# Patient Record
Sex: Male | Born: 1940 | Race: White | Hispanic: No | Marital: Married | Smoking: Former smoker
Health system: Southern US, Community
[De-identification: ages and names within clinical notes are randomized; demographics above are authoritative.]

## PROBLEM LIST (undated history)

## (undated) DIAGNOSIS — M1990 Unspecified osteoarthritis, unspecified site: Secondary | ICD-10-CM

## (undated) DIAGNOSIS — I493 Ventricular premature depolarization: Secondary | ICD-10-CM

## (undated) DIAGNOSIS — I714 Abdominal aortic aneurysm, without rupture, unspecified: Secondary | ICD-10-CM

## (undated) DIAGNOSIS — I739 Peripheral vascular disease, unspecified: Secondary | ICD-10-CM

## (undated) DIAGNOSIS — E785 Hyperlipidemia, unspecified: Secondary | ICD-10-CM

## (undated) DIAGNOSIS — I219 Acute myocardial infarction, unspecified: Secondary | ICD-10-CM

## (undated) DIAGNOSIS — D6859 Other primary thrombophilia: Secondary | ICD-10-CM

## (undated) DIAGNOSIS — Z91041 Radiographic dye allergy status: Secondary | ICD-10-CM

## (undated) DIAGNOSIS — I2510 Atherosclerotic heart disease of native coronary artery without angina pectoris: Secondary | ICD-10-CM

## (undated) DIAGNOSIS — I469 Cardiac arrest, cause unspecified: Secondary | ICD-10-CM

## (undated) DIAGNOSIS — Z95810 Presence of automatic (implantable) cardiac defibrillator: Secondary | ICD-10-CM

## (undated) DIAGNOSIS — I472 Ventricular tachycardia, unspecified: Secondary | ICD-10-CM

## (undated) DIAGNOSIS — K649 Unspecified hemorrhoids: Secondary | ICD-10-CM

## (undated) DIAGNOSIS — J189 Pneumonia, unspecified organism: Secondary | ICD-10-CM

## (undated) HISTORY — DX: Hyperlipidemia, unspecified: E78.5

## (undated) HISTORY — PX: TONSILLECTOMY: SUR1361

## (undated) HISTORY — DX: Ventricular premature depolarization: I49.3

## (undated) HISTORY — DX: Other primary thrombophilia: D68.59

## (undated) HISTORY — DX: Ventricular tachycardia: I47.2

## (undated) HISTORY — DX: Peripheral vascular disease, unspecified: I73.9

## (undated) HISTORY — DX: Ventricular tachycardia, unspecified: I47.20

## (undated) HISTORY — DX: Acute myocardial infarction, unspecified: I21.9

## (undated) HISTORY — DX: Cardiac arrest, cause unspecified: I46.9

## (undated) HISTORY — PX: CHOLECYSTECTOMY: SHX55

## (undated) HISTORY — DX: Atherosclerotic heart disease of native coronary artery without angina pectoris: I25.10

---

## 1979-12-13 DIAGNOSIS — I219 Acute myocardial infarction, unspecified: Secondary | ICD-10-CM

## 1979-12-13 HISTORY — DX: Acute myocardial infarction, unspecified: I21.9

## 1997-12-12 HISTORY — PX: CORONARY ANGIOPLASTY WITH STENT PLACEMENT: SHX49

## 1998-03-20 ENCOUNTER — Ambulatory Visit (HOSPITAL_COMMUNITY): Admission: RE | Admit: 1998-03-20 | Discharge: 1998-03-20 | Payer: Self-pay | Admitting: Cardiovascular Disease

## 1998-03-24 ENCOUNTER — Ambulatory Visit (HOSPITAL_COMMUNITY): Admission: RE | Admit: 1998-03-24 | Discharge: 1998-03-25 | Payer: Self-pay | Admitting: Cardiovascular Disease

## 1998-06-23 ENCOUNTER — Ambulatory Visit (HOSPITAL_COMMUNITY): Admission: RE | Admit: 1998-06-23 | Discharge: 1998-06-23 | Payer: Self-pay | Admitting: Cardiology

## 2001-02-20 ENCOUNTER — Encounter: Payer: Self-pay | Admitting: Internal Medicine

## 2001-02-20 ENCOUNTER — Encounter: Admission: RE | Admit: 2001-02-20 | Discharge: 2001-02-20 | Payer: Self-pay | Admitting: Internal Medicine

## 2001-04-10 ENCOUNTER — Encounter: Admission: RE | Admit: 2001-04-10 | Discharge: 2001-04-10 | Payer: Self-pay | Admitting: Internal Medicine

## 2001-04-10 ENCOUNTER — Encounter: Payer: Self-pay | Admitting: Internal Medicine

## 2001-09-12 ENCOUNTER — Encounter: Payer: Self-pay | Admitting: Geriatric Medicine

## 2001-09-12 ENCOUNTER — Encounter: Admission: RE | Admit: 2001-09-12 | Discharge: 2001-09-12 | Payer: Self-pay | Admitting: Geriatric Medicine

## 2001-10-11 ENCOUNTER — Encounter: Payer: Self-pay | Admitting: Internal Medicine

## 2001-10-11 ENCOUNTER — Encounter: Admission: RE | Admit: 2001-10-11 | Discharge: 2001-10-11 | Payer: Self-pay | Admitting: Internal Medicine

## 2001-10-12 ENCOUNTER — Encounter: Payer: Self-pay | Admitting: Internal Medicine

## 2001-10-12 ENCOUNTER — Encounter: Admission: RE | Admit: 2001-10-12 | Discharge: 2001-10-12 | Payer: Self-pay | Admitting: Internal Medicine

## 2001-10-29 ENCOUNTER — Ambulatory Visit (HOSPITAL_COMMUNITY): Admission: RE | Admit: 2001-10-29 | Discharge: 2001-10-29 | Payer: Self-pay | Admitting: Gastroenterology

## 2001-10-29 ENCOUNTER — Encounter: Payer: Self-pay | Admitting: Gastroenterology

## 2003-05-14 ENCOUNTER — Ambulatory Visit (HOSPITAL_COMMUNITY): Admission: RE | Admit: 2003-05-14 | Discharge: 2003-05-14 | Payer: Self-pay | Admitting: Gastroenterology

## 2006-02-06 ENCOUNTER — Encounter: Admission: RE | Admit: 2006-02-06 | Discharge: 2006-02-14 | Payer: Self-pay | Admitting: Neurology

## 2006-11-10 ENCOUNTER — Encounter: Admission: RE | Admit: 2006-11-10 | Discharge: 2006-11-10 | Payer: Self-pay | Admitting: Internal Medicine

## 2008-03-28 ENCOUNTER — Ambulatory Visit: Payer: Self-pay | Admitting: Internal Medicine

## 2008-03-28 ENCOUNTER — Inpatient Hospital Stay (HOSPITAL_COMMUNITY): Admission: EM | Admit: 2008-03-28 | Discharge: 2008-04-08 | Payer: Self-pay | Admitting: Emergency Medicine

## 2008-03-28 ENCOUNTER — Ambulatory Visit: Payer: Self-pay | Admitting: *Deleted

## 2008-03-29 ENCOUNTER — Ambulatory Visit: Payer: Self-pay | Admitting: Vascular Surgery

## 2008-03-29 ENCOUNTER — Encounter: Payer: Self-pay | Admitting: Internal Medicine

## 2008-03-29 DIAGNOSIS — I469 Cardiac arrest, cause unspecified: Secondary | ICD-10-CM

## 2008-03-29 HISTORY — DX: Cardiac arrest, cause unspecified: I46.9

## 2008-03-31 ENCOUNTER — Encounter: Payer: Self-pay | Admitting: Internal Medicine

## 2008-04-04 HISTORY — PX: CARDIAC CATHETERIZATION: SHX172

## 2008-04-07 HISTORY — PX: CARDIAC DEFIBRILLATOR PLACEMENT: SHX171

## 2008-04-14 ENCOUNTER — Ambulatory Visit (HOSPITAL_COMMUNITY): Admission: RE | Admit: 2008-04-14 | Discharge: 2008-04-14 | Payer: Self-pay | Admitting: Cardiovascular Disease

## 2008-04-14 ENCOUNTER — Encounter: Payer: Self-pay | Admitting: Cardiovascular Disease

## 2008-04-23 ENCOUNTER — Ambulatory Visit: Payer: Self-pay

## 2008-05-08 ENCOUNTER — Encounter (HOSPITAL_COMMUNITY): Admission: RE | Admit: 2008-05-08 | Discharge: 2008-08-06 | Payer: Self-pay | Admitting: Cardiology

## 2008-07-29 ENCOUNTER — Ambulatory Visit: Payer: Self-pay | Admitting: Internal Medicine

## 2008-08-07 ENCOUNTER — Encounter (HOSPITAL_COMMUNITY): Admission: RE | Admit: 2008-08-07 | Discharge: 2008-09-08 | Payer: Self-pay | Admitting: Cardiology

## 2008-10-13 ENCOUNTER — Ambulatory Visit: Payer: Self-pay | Admitting: Internal Medicine

## 2009-01-12 ENCOUNTER — Ambulatory Visit: Payer: Self-pay | Admitting: Internal Medicine

## 2009-01-26 ENCOUNTER — Encounter: Admission: RE | Admit: 2009-01-26 | Discharge: 2009-01-26 | Payer: Self-pay | Admitting: Internal Medicine

## 2009-02-04 ENCOUNTER — Ambulatory Visit (HOSPITAL_COMMUNITY): Admission: RE | Admit: 2009-02-04 | Discharge: 2009-02-04 | Payer: Self-pay | Admitting: Internal Medicine

## 2009-02-19 ENCOUNTER — Ambulatory Visit: Payer: Self-pay | Admitting: *Deleted

## 2009-02-25 ENCOUNTER — Encounter: Admission: RE | Admit: 2009-02-25 | Discharge: 2009-02-25 | Payer: Self-pay | Admitting: *Deleted

## 2009-03-05 ENCOUNTER — Ambulatory Visit: Payer: Self-pay | Admitting: *Deleted

## 2009-03-06 ENCOUNTER — Encounter: Payer: Self-pay | Admitting: Internal Medicine

## 2009-04-13 ENCOUNTER — Ambulatory Visit: Payer: Self-pay | Admitting: Internal Medicine

## 2009-04-21 ENCOUNTER — Encounter: Payer: Self-pay | Admitting: Internal Medicine

## 2009-07-31 IMAGING — CR DG CHEST 1V PORT
1 series · 1 of 1 positions shown · non-contrast
Comparison: None

CLINICAL DATA: Acute respiratory failure and cardiac arrest.

PORTABLE CHEST - 1 VIEW

[view not recorded]
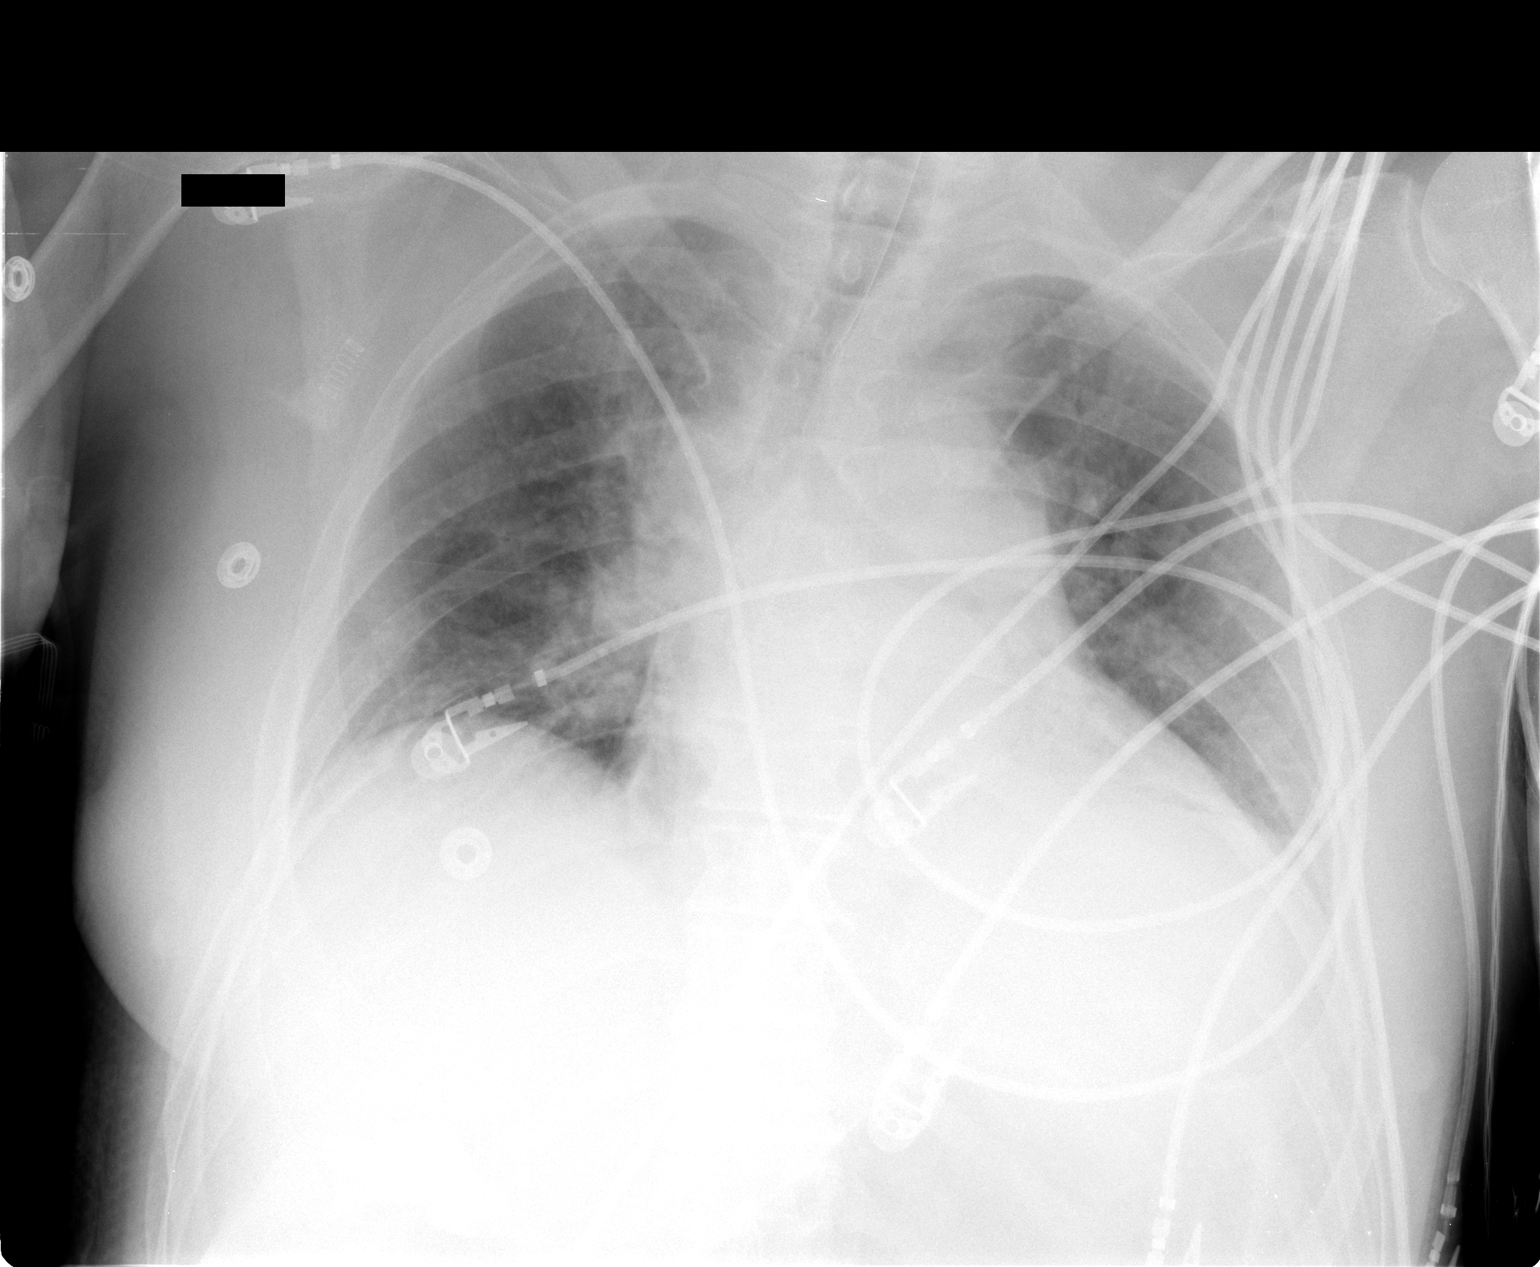

[1 of 1 positions shown; findings below may reference images not displayed]

FINDINGS: Endotracheal tube tip is in the mid thoracic trachea.
Low lung volumes are seen with mild bibasilar atelectasis.  There
is no evidence of pulmonary consolidation or pleural effusion.
Heart size is prominent but exaggerated by low lung volumes.
IMPRESSION: Endotracheal tube in appropriate position.  Low lung volumes and
bibasilar atelectasis.

## 2009-08-10 DIAGNOSIS — I4901 Ventricular fibrillation: Secondary | ICD-10-CM | POA: Insufficient documentation

## 2009-08-10 DIAGNOSIS — I2510 Atherosclerotic heart disease of native coronary artery without angina pectoris: Secondary | ICD-10-CM | POA: Insufficient documentation

## 2009-08-10 DIAGNOSIS — E782 Mixed hyperlipidemia: Secondary | ICD-10-CM | POA: Insufficient documentation

## 2009-08-11 ENCOUNTER — Ambulatory Visit: Payer: Self-pay | Admitting: Internal Medicine

## 2009-08-13 ENCOUNTER — Observation Stay (HOSPITAL_COMMUNITY): Admission: RE | Admit: 2009-08-13 | Discharge: 2009-08-14 | Payer: Self-pay | Admitting: General Surgery

## 2009-08-13 ENCOUNTER — Encounter (INDEPENDENT_AMBULATORY_CARE_PROVIDER_SITE_OTHER): Payer: Self-pay | Admitting: General Surgery

## 2009-08-13 HISTORY — PX: CHOLECYSTECTOMY, LAPAROSCOPIC: SHX56

## 2009-09-09 ENCOUNTER — Ambulatory Visit: Payer: Self-pay | Admitting: Vascular Surgery

## 2009-11-08 ENCOUNTER — Encounter: Payer: Self-pay | Admitting: Internal Medicine

## 2009-11-09 ENCOUNTER — Ambulatory Visit: Payer: Self-pay | Admitting: Internal Medicine

## 2009-11-18 ENCOUNTER — Encounter: Payer: Self-pay | Admitting: Internal Medicine

## 2010-01-12 ENCOUNTER — Encounter: Payer: Self-pay | Admitting: Internal Medicine

## 2010-01-12 ENCOUNTER — Emergency Department (HOSPITAL_COMMUNITY): Admission: EM | Admit: 2010-01-12 | Discharge: 2010-01-12 | Payer: Self-pay | Admitting: Emergency Medicine

## 2010-01-13 ENCOUNTER — Telehealth: Payer: Self-pay | Admitting: Internal Medicine

## 2010-02-07 ENCOUNTER — Encounter: Payer: Self-pay | Admitting: Internal Medicine

## 2010-02-08 ENCOUNTER — Telehealth: Payer: Self-pay | Admitting: Internal Medicine

## 2010-02-08 ENCOUNTER — Ambulatory Visit: Payer: Self-pay | Admitting: Internal Medicine

## 2010-02-16 ENCOUNTER — Encounter: Payer: Self-pay | Admitting: Internal Medicine

## 2010-02-24 ENCOUNTER — Ambulatory Visit: Payer: Self-pay | Admitting: Vascular Surgery

## 2010-04-19 ENCOUNTER — Encounter: Payer: Self-pay | Admitting: Internal Medicine

## 2010-05-09 ENCOUNTER — Encounter: Payer: Self-pay | Admitting: Internal Medicine

## 2010-05-10 ENCOUNTER — Ambulatory Visit: Payer: Self-pay | Admitting: Internal Medicine

## 2010-06-10 ENCOUNTER — Encounter: Payer: Self-pay | Admitting: Internal Medicine

## 2010-07-13 ENCOUNTER — Ambulatory Visit: Payer: Self-pay | Admitting: Cardiology

## 2010-07-19 ENCOUNTER — Ambulatory Visit: Payer: Self-pay | Admitting: Cardiology

## 2010-08-31 ENCOUNTER — Ambulatory Visit: Payer: Self-pay | Admitting: Internal Medicine

## 2010-11-09 ENCOUNTER — Ambulatory Visit: Payer: Self-pay | Admitting: Vascular Surgery

## 2010-11-22 ENCOUNTER — Ambulatory Visit: Payer: Self-pay | Admitting: Cardiology

## 2010-12-02 ENCOUNTER — Ambulatory Visit: Payer: Self-pay | Admitting: Internal Medicine

## 2011-01-09 ENCOUNTER — Encounter (INDEPENDENT_AMBULATORY_CARE_PROVIDER_SITE_OTHER): Payer: Self-pay | Admitting: *Deleted

## 2011-01-11 NOTE — Letter (Signed)
Summary: Remote Device Check  [facility], Main Office  [address]   [place], [place] [address]   Phone: [phone]  Fax: [phone]     February 16, 2010 MRN: [id number]   Joel Lin [address] [place], [place]  [address]   Dear [name],   Your remote transmission was recieved and reviewed by your physician.  All diagnostics were within normal limits for you.  __X___Your next transmission is scheduled for:    May 10, 2010.  Please transmit at any time this day.  If you have a wireless device your transmission will be sent automatically.     Sincerely,  Proofreader

## 2011-01-11 NOTE — Progress Notes (Signed)
Summary: **SK** defib went off   Phone Note Call from Patient Call back at Home Phone [phone]   Caller: Patient Reason for Call: Talk to Nurse, Talk to Doctor Summary of Call: per pt call his defib went off and he was seen at [facility] ed last night  Initial call taken by: Omer Jack,  January 13, 2010 8:38 AM  Follow-up for Phone Call        pt calling again, was told that he would need to be seen today or tomorrow per the er, Migdalia Dk  January 13, 2010 11:55 AM   Additional Follow-up for Phone Call Additional follow up Details #1::        Pts defib fired 3 times yesterday and he was advised to go to the ER. He was checked by industry, Hospital doctor has a copy. Nothing was found, his device fired only d/t elevated HR (193). His set rate is 180. He started a steroid pack and nasal steroids yesterday. He went to the dentist and was given Novicaine with Epi. His meds were changed in the ER to Atenolol 25mg , 2 in AM and 2 in PM. Pt feels fine. He stopped the steroids. Just wanted to run this all by Dr. Graciela Husbands to see if he wanted to make any med changes or increase his Upper device rate. I will s/w Dr. Graciela Husbands about this first thing in the AM and call him if he needs to come in or if we need to make changes. Contact # [phone].  Additional Follow-up by: Duncan Dull, RN, BSN,  January 13, 2010 5:33 PM    Additional Follow-up for Phone Call Additional follow up Details #2::    Per Dr. Graciela Husbands, pt received proper treatment and should continue with hosp d/c plan. He will need to f/u with our device clinic in 6-8 weeks. Pt aware and will call to make the appt.  Follow-up by: Duncan Dull, RN, BSN,  January 14, 2010 11:23 AM

## 2011-01-11 NOTE — Progress Notes (Signed)
Summary: follow up appts   Phone Note Outgoing Call Call back at [facility] Phone [phone]   Call placed by: Gypsy Balsam RN BSN,  February 08, 2010 2:24 PM Summary of Call: Called patient and left message on machine for patient to call about follow up appts.  Merlin transmission recieved today.  No arrythmias.  Pt has appt with device clinic next month that can be cancelled and can follow up with Dr Graciela Husbands as scheduled in September unless pt wants earlier follow up with Graciela Husbands. Gypsy Balsam RN BSN  February 08, 2010 2:25 PM  Plan per above, ok per patient. Gypsy Balsam RN BSN  February 08, 2010 4:46 PM

## 2011-01-11 NOTE — Cardiovascular Report (Signed)
Summary: Office Visit Remote   Office Visit Remote   Imported By: Roderic Ovens 06/11/2010 16:29:49  _____________________________________________________________________  External Attachment:    Type:   Image     Comment:   External Document

## 2011-01-11 NOTE — Cardiovascular Report (Signed)
Summary: Office Visit Remote   Office Visit Remote   Imported By: Roderic Ovens 02/17/2010 11:10:21  _____________________________________________________________________  External Attachment:    Type:   Image     Comment:   External Document

## 2011-01-11 NOTE — Cardiovascular Report (Signed)
Summary: Office Visit   Office Visit   Imported By: Roderic Ovens 08/31/2010 14:35:58  _____________________________________________________________________  External Attachment:    Type:   Image     Comment:   External Document

## 2011-01-11 NOTE — Assessment & Plan Note (Signed)
Summary: defib/saf  Medications Added NITRO-DUR 0.2 MG/HR PT24 (NITROGLYCERIN) UAD NITROSTAT 0.4 MG SUBL (NITROGLYCERIN) 1 tablet under tongue at onset of chest pain; you may repeat every 5 minutes for up to 3 doses. PENICILLIN V POTASSIUM 500 MG TABS (PENICILLIN V POTASSIUM) 1 tablet 4 times per day      Allergies Added: ! IODINE  History of Present Illness: Mr. Joel Lin is seen in followup for aborted sudden cardiac death in the setting of ischemic heart disease prior PCI and mild depression of LV systolic function.  he is status post ICD implantation.    There has been an intercurrent Myoview scan in May; ejection fraction had improved to 53%; previous IMI evident.  There have been no intercurrent discharges. He denies chest pain or shortness of breath.  Current Medications (verified): 1)  Niaspan 500 Mg Cr-Tabs (Niacin (Antihyperlipidemic)) .... Take One Tablet Bid. 2)  Atenolol 25 Mg Tabs (Atenolol) .... 3 Tabs Per Day 3)  Welchol 625 Mg Tabs (Colesevelam Hcl) .... Take Three Tablets Two Times A Day 4)  Fish Oil   Oil (Fish Oil) .... 1000mg  Take One Tablet By Mouth Once Daily. 5)  Aspirin 81 Mg Tabs (Aspirin) .... Once Daily 6)  Nitro-Dur 0.2 Mg/hr Pt24 (Nitroglycerin) .... Uad 7)  Nitrostat 0.4 Mg Subl (Nitroglycerin) .Marland Kitchen.. 1 Tablet Under Tongue At Onset of Chest Pain; You May Repeat Every 5 Minutes For Up To 3 Doses. 8)  Penicillin V Potassium 500 Mg Tabs (Penicillin V Potassium) .Marland Kitchen.. 1 Tablet 4 Times Per Day  Allergies (verified): 1)  ! Iodine  Vital Signs:  Patient profile:   70 year old male Height:      68 inches Weight:      168 pounds BMI:     25.64 Pulse rate:   58 / minute BP sitting:   124 / 80  (left arm)  Vitals Entered By: Laurance Flatten CMA (August 31, 2010 9:58 AM)  Physical Exam  General:  The patient was alert and oriented in no acute distress. HEENT Normal.  Neck veins were flat, carotids were brisk.  Lungs were clear.  Heart sounds were  regular without murmurs or gallops.  Abdomen was soft with active bowel sounds. There is no clubbing cyanosis or edema. Skin Warm and dry     ICD Specifications Following MD:  Sherryl Manges, MD     ICD Vendor:  [employer]     ICD Model Number:  [phone]     ICD Serial Number:  [id number] ICD DOI:  04/07/2008     ICD Implanting MD:  Sherryl Manges, MD  Lead 1:    Location: RV     DOI: 04/07/2008     Model #: [id number]     Serial #: [id number]     Status: active  Indications::  ABORTED CARDIAC ARREST   ICD Follow Up Remote Check?  No Battery Voltage:  3.17 V     Charge Time:  11.3 seconds     Battery Est. Longevity:  6.6 years Underlying rhythm:  Huston Foley ICD Dependent:  No       ICD Device Measurements Right Ventricle:  Amplitude: 8.9 mV, Impedance: 350 ohms, Threshold: 1.0 V at 0.4 msec Shock Impedance: 47 ohms   Brady Parameters Mode VVI     Lower Rate Limit:  40      Tachy Zones VF:  240     VT:  214     VT1:  181     Next Remote  Date:  12/02/2010     Next Cardiology Appt Due:  08/13/2011 Tech Comments:  No parameter changes. Device function normal.  3 magnet alerts were during colonscopy 06/01/10.  Merlin transmissions every 3 months.  ROV 1 year with Dr. Graciela Husbands. Altha Harm, LPN  August 31, 2010 10:05 AM   Impression & Recommendations:  Problem # 1:  DEFIBRILLATOR ST J (ICD-V45.02) Assessment New Device parameters and data were reviewed and no changes were made  Problem # 2:  VENTRICULAR FIBRILLATION (ICD-427.41) no intercurrent ventricular arrhythmias  His updated medication list for this problem includes:    Atenolol 25 Mg Tabs (Atenolol) .Marland KitchenMarland KitchenMarland KitchenMarland Kitchen 3 tabs per day    Aspirin 81 Mg Tabs (Aspirin) ..... Once daily    Nitro-dur 0.2 Mg/hr Pt24 (Nitroglycerin) ..... Uad    Nitrostat 0.4 Mg Subl (Nitroglycerin) .Marland Kitchen... 1 tablet under tongue at onset of chest pain; you may repeat every 5 minutes for up to 3 doses.  Problem # 3:  CORONARY ATHEROSCLEROSIS (ICD-414.00) without chest pain; Myoview  without ischemia  His updated medication list for this problem includes:    Atenolol 25 Mg Tabs (Atenolol) .Marland KitchenMarland KitchenMarland KitchenMarland Kitchen 3 tabs per day    Aspirin 81 Mg Tabs (Aspirin) ..... Once daily    Nitro-dur 0.2 Mg/hr Pt24 (Nitroglycerin) ..... Uad    Nitrostat 0.4 Mg Subl (Nitroglycerin) .Marland Kitchen... 1 tablet under tongue at onset of chest pain; you may repeat every 5 minutes for up to 3 doses.  Patient Instructions: 1)  Your physician recommends that you continue on your current medications as directed. Please refer to the Current Medication list given to you today. 2)  Your physician wants you to follow-up in:  1 year You will receive a reminder letter in the mail two months in advance. If you don't receive a letter, please call our office to schedule the follow-up appointment.

## 2011-01-11 NOTE — Cardiovascular Report (Signed)
Summary: Office Visit  Office Visit   Imported By: Roderic Ovens 01/20/2010 12:42:59  _____________________________________________________________________  External Attachment:    Type:   Image     Comment:   External Document

## 2011-01-11 NOTE — Letter (Signed)
Summary: Remote Device Check  [facility], Main Office  [address]   [place], [place] [address]   Phone: [phone]  Fax: [phone]     June 10, 2010 MRN: [id number]   Joel Lin [address] [place], [place]  [address]   Dear Joel Lin,   Your remote transmission was recieved and reviewed by your physician.  All diagnostics were within normal limits for you.   __X____Your next office visit is scheduled for:   September 2011 with Dr Graciela Husbands. Please call our office to schedule an appointment.    Sincerely,  Vella Kohler

## 2011-01-19 NOTE — Cardiovascular Report (Signed)
Summary: Office Visit Remote   Office Visit Remote   Imported By: Roderic Ovens 01/10/2011 12:08:39  _____________________________________________________________________  External Attachment:    Type:   Image     Comment:   External Document

## 2011-01-19 NOTE — Letter (Signed)
Summary: Remote Device Check  [facility], Main Office  [address]   [place], [place] [address]   Phone: [phone]  Fax: [phone]     January 09, 2011 MRN: [id number]   Joel Lin [address] [place], [place]  [address]   Dear [name],   Your remote transmission was recieved and reviewed by your physician.  All diagnostics were within normal limits for you.  __X___Your next transmission is scheduled for:   03-03-2011.  Please transmit at any time this day.  If you have a wireless device your transmission will be sent automatically.   Sincerely,  Vella Kohler

## 2011-01-20 ENCOUNTER — Ambulatory Visit (INDEPENDENT_AMBULATORY_CARE_PROVIDER_SITE_OTHER): Payer: Medicare Other | Admitting: Cardiology

## 2011-01-20 DIAGNOSIS — R5383 Other fatigue: Secondary | ICD-10-CM

## 2011-01-20 DIAGNOSIS — R42 Dizziness and giddiness: Secondary | ICD-10-CM

## 2011-01-20 DIAGNOSIS — R5381 Other malaise: Secondary | ICD-10-CM

## 2011-01-20 DIAGNOSIS — I252 Old myocardial infarction: Secondary | ICD-10-CM

## 2011-01-24 ENCOUNTER — Encounter: Payer: Self-pay | Admitting: Internal Medicine

## 2011-01-24 ENCOUNTER — Encounter (INDEPENDENT_AMBULATORY_CARE_PROVIDER_SITE_OTHER): Payer: Medicare Other

## 2011-01-24 DIAGNOSIS — I428 Other cardiomyopathies: Secondary | ICD-10-CM

## 2011-01-25 ENCOUNTER — Ambulatory Visit (INDEPENDENT_AMBULATORY_CARE_PROVIDER_SITE_OTHER): Payer: Medicare Other | Admitting: Cardiology

## 2011-01-25 DIAGNOSIS — I2510 Atherosclerotic heart disease of native coronary artery without angina pectoris: Secondary | ICD-10-CM

## 2011-01-25 DIAGNOSIS — E7800 Pure hypercholesterolemia, unspecified: Secondary | ICD-10-CM

## 2011-01-25 DIAGNOSIS — I495 Sick sinus syndrome: Secondary | ICD-10-CM

## 2011-02-02 NOTE — Procedures (Signed)
Summary: defib ck/mt  Medications Added NIASPAN 500 MG CR-TABS (NIACIN (ANTIHYPERLIPIDEMIC)) one by mouth daily THERAGRAN-M  TABS (MULTIPLE VITAMINS-MINERALS) one by mouth daily IBUPROFEN 200 MG CAPS (IBUPROFEN) as needed      Allergies Added: ! PLAVIX  Current Medications (verified): 1)  Niaspan 500 Mg Cr-Tabs (Niacin (Antihyperlipidemic)) .... One By Mouth Daily 2)  Atenolol 25 Mg Tabs (Atenolol) .... 3 Tabs Per Day 3)  Welchol 625 Mg Tabs (Colesevelam Hcl) .... Take Three Tablets Two Times A Day 4)  Fish Oil   Oil (Fish Oil) .... 1000mg  Take One Tablet By Mouth Once Daily. 5)  Aspirin 81 Mg Tabs (Aspirin) .... Once Daily 6)  Nitro-Dur 0.2 Mg/hr Pt24 (Nitroglycerin) .... Uad 7)  Nitrostat 0.4 Mg Subl (Nitroglycerin) .Marland Kitchen.. 1 Tablet Under Tongue At Onset of Chest Pain; You May Repeat Every 5 Minutes For Up To 3 Doses. 8)  Theragran-M  Tabs (Multiple Vitamins-Minerals) .... One By Mouth Daily 9)  Ibuprofen 200 Mg Caps (Ibuprofen) .... As Needed  Allergies (verified): 1)  ! Iodine 2)  ! Plavix   ICD Specifications Following MD:  Sherryl Manges, MD     ICD Vendor:  [facility] Jude     ICD Model Number:  [phone]     ICD Serial Number:  [id number] ICD DOI:  04/07/2008     ICD Implanting MD:  Sherryl Manges, MD  Lead 1:    Location: RV     DOI: 04/07/2008     Model #: [id number]     Serial #: [id number]     Status: active  Indications::  ABORTED CARDIAC ARREST   ICD Follow Up ICD Dependent:  No      Brady Parameters Mode VVI     Lower Rate Limit:  40      Tachy Zones VF:  240     VT:  214     VT1:  181     Tech Comments:  see PaceArt

## 2011-02-08 NOTE — Cardiovascular Report (Signed)
Summary: Office Visit   Office Visit   Imported By: Roderic Ovens 02/04/2011 09:06:24  _____________________________________________________________________  External Attachment:    Type:   Image     Comment:   External Document

## 2011-02-17 NOTE — Cardiovascular Report (Signed)
Summary: Office Visit   Office Visit   Imported By: Roderic Ovens 02/10/2011 16:15:16  _____________________________________________________________________  External Attachment:    Type:   Image     Comment:   External Document

## 2011-03-02 LAB — POCT I-STAT, CHEM 8
BUN: 24 mg/dL — ABNORMAL HIGH (ref 6–23)
Creatinine, Ser: 0.8 mg/dL (ref 0.4–1.5)
Glucose, Bld: 124 mg/dL — ABNORMAL HIGH (ref 70–99)
Hemoglobin: 16.3 g/dL (ref 13.0–17.0)
Potassium: 4.1 mEq/L (ref 3.5–5.1)

## 2011-03-02 LAB — URINE MICROSCOPIC-ADD ON

## 2011-03-02 LAB — POCT CARDIAC MARKERS
CKMB, poc: 6.7 ng/mL (ref 1.0–8.0)
Troponin i, poc: <0.05 ng/mL (ref 0.00–0.09)

## 2011-03-02 LAB — DIFFERENTIAL
Eosinophils Absolute: 0 10*3/uL (ref 0.0–0.7)
Lymphocytes Relative: 8 % — ABNORMAL LOW (ref 12–46)
Lymphs Abs: 0.9 10*3/uL (ref 0.7–4.0)
Monocytes Relative: 6 % (ref 3–12)
Neutrophils Relative %: 85 % — ABNORMAL HIGH (ref 43–77)

## 2011-03-02 LAB — URINALYSIS, ROUTINE W REFLEX MICROSCOPIC
Leukocytes, UA: NEGATIVE
Nitrite: NEGATIVE
Protein, ur: NEGATIVE mg/dL
Specific Gravity, Urine: 1.021 (ref 1.005–1.030)
Urobilinogen, UA: 0.2 mg/dL (ref 0.0–1.0)

## 2011-03-02 LAB — BASIC METABOLIC PANEL
Chloride: 107 mEq/L (ref 96–112)
Creatinine, Ser: 0.94 mg/dL (ref 0.4–1.5)
GFR calc Af Amer: >60 mL/min (ref >60)
Potassium: 4.2 mEq/L (ref 3.5–5.1)

## 2011-03-02 LAB — CBC
HCT: 47.5 % (ref 39.0–52.0)
MCV: 88.4 fL (ref 78.0–100.0)
RBC: 5.38 MIL/uL (ref 4.22–5.81)
WBC: 10.8 10*3/uL — ABNORMAL HIGH (ref 4.0–10.5)

## 2011-03-19 LAB — CBC
HCT: 47.3 % (ref 39.0–52.0)
MCHC: 33 g/dL (ref 30.0–36.0)
MCV: 88.4 fL (ref 78.0–100.0)
Platelets: 118 10*3/uL — ABNORMAL LOW (ref 150–400)
RDW: 13.8 % (ref 11.5–15.5)

## 2011-03-19 LAB — COMPREHENSIVE METABOLIC PANEL
BUN: 16 mg/dL (ref 6–23)
CO2: 29 mEq/L (ref 19–32)
Calcium: 9.5 mg/dL (ref 8.4–10.5)
Creatinine, Ser: 0.99 mg/dL (ref 0.4–1.5)
GFR calc non Af Amer: >60 mL/min (ref >60)
Glucose, Bld: 96 mg/dL (ref 70–99)
Sodium: 140 mEq/L (ref 135–145)
Total Protein: 7.2 g/dL (ref 6.0–8.3)

## 2011-03-19 LAB — PROTIME-INR
INR: 1 (ref 0.00–1.49)
Prothrombin Time: 13.3 seconds (ref 11.6–15.2)

## 2011-03-19 LAB — DIFFERENTIAL
Eosinophils Absolute: 0.2 10*3/uL (ref 0.0–0.7)
Lymphocytes Relative: 32 % (ref 12–46)
Lymphs Abs: 1.8 10*3/uL (ref 0.7–4.0)
Monocytes Relative: 17 % — ABNORMAL HIGH (ref 3–12)
Neutro Abs: 2.7 10*3/uL (ref 1.7–7.7)
Neutrophils Relative %: 47 % (ref 43–77)

## 2011-03-19 LAB — APTT: aPTT: 26 seconds (ref 24–37)

## 2011-04-26 NOTE — Consult Note (Signed)
NAME:  Joel Lin NO.:  [id number]   MEDICAL RECORD NO.:  [id number]           PATIENT TYPE:   LOCATION:                                 FACILITY:   PHYSICIAN:  Duke Salvia, MD, FACCDATE OF BIRTH:  11-Jan-1941   DATE OF CONSULTATION:  DATE OF DISCHARGE:                                 CONSULTATION   HISTORY:  Thank you very much for asking [place] to see Joel Lin in  consultation following out-of-hospital cardiac arrest.  He has a remote  history of myocardial infarction in 1981 and underwent stenting in 1998  by Dr. Elease Hashimoto.  He had done well in the interim with basically class I  symptoms until he collapsed suddenly on March 29, 2008.  EMS was called.  He was found to be in ventricular fibrillation.  He was defibrillated  x3.  Electrocardiograms were nondiagnostic for acute myocardial  infarction.  It turned out his peak troponin was 0.72.  He was brought  to the hospital where his CT scan of his head was negative and a  hypothermia protocol was initiated.  He was extubated yesterday and on  the day of the consultation and was alert and relatively oriented at  that time.   His cardiac evaluation here to date has included an ultrasound  demonstrated ejection fraction of 35%-40% with inferoposterior wall MI.  Telemetry has demonstrated PVCs.   The patient's past medical history is notable for hypertension,  dyslipidemia, and questionable hypercoagulable state.   MEDICATIONS AT HOME:  Included atenolol, Nitropress, Niaspan, aspirin,  and currently, he is on intravenous beta-blockers and aspirin.   ALLERGIES:  He has no known drug allergies.   SOCIAL HISTORY:  He is married.  He has a daughter who is a Engineer, civil (consulting).   FAMILY HISTORY:  Noncontributory.   PHYSICAL EXAM:  On examination, his blood pressure is 126/55 having been  previously in the day as low as 95/50.  His respirations were 12-14.  His pulse was 93 and regular.  He was in no acute distress.   His HEENT  exam demonstrated no evidence for xanthoma.  His neck veins were flat.  His carotids were brisk and full bilateral without bruits.  The back was  without kyphosis or scoliosis.  His lungs were clear.  His heart sounds  were regular.  The abdomen was soft with active bowel sounds without  midline pulsation or hepatomegaly.  Femoral pulses were 2+.  Distal  pulses were intact with no clubbing, cyanosis, or edema.  The  neurological exam was grossly normal.  His skin was warm and dry.   Electrocardiogram dated April 01, 2008, demonstrated sinus rhythm at 73  with intervals of 0.24/0.10/0.39.  There was evidence of previous  inferior predominantly posterior wall myocardial infarction.   PT has been done since the consultation yesterday with no significant  abnormalities with some generalized slowing.   LABORATORY DATA:  Burgess Estelle was notable for potassium of 3.2 and  hemoglobin of 10.7.   IMPRESSION:  1. Out-of-hospital cardiac arrest.  2. Coronary artery disease.      a.  Remote myocardial infarction with ejection fraction of 35%.      b.     Non-STEMI peak troponin of 0.723.  3. Congestive heart failure - class I.  4. No history of palpitations.  5. Hypokalemia.   DISCUSSION:  Mr. Joel Lin had out-of-hospital cardiac arrest in the  setting of remote myocardial infarction without evidence of predominant  new cardiac event.  Catheterization is anticipated and would concur.   Cardiac arrest is seen in late myocardial infarction and the risk  increases overtime as dated from the MADIT II trial showed.  It is  likely because of his event.  ICD implantation is also indicated for  prevention of recurrent cardiac arrest.   I have reviewed the above with the family.  If he gets closer to the  time of the procedure, we will review it again as the patient's mental  status remains modestly diminished.      Duke Salvia, MD, [facility]  Electronically Signed      SCK/MEDQ  D:  04/02/2008  T:  04/03/2008  Job:  [id number]   cc:   Cassell Clement, M.D.

## 2011-04-26 NOTE — Consult Note (Signed)
NAME:  Joel Lin NO.:  [id number]   MEDICAL RECORD NO.:  [id number]          PATIENT TYPE:  INP   LOCATION:  2905                         FACILITY:  [facility]   PHYSICIAN:  Audery Amel, MD    DATE OF BIRTH:  [birth year]   DATE OF CONSULTATION:  03/29/2008  DATE OF DISCHARGE:                                 CONSULTATION   REASON FOR CONSULTATION:  Cardiac arrest.   HISTORY OF PRESENT ILLNESS:  Joel Lin is a 70 year old white male  with a remote history of coronary artery disease, status post PCI in the  late 90s who presents to the emergency department after experiencing out  of hospital cardiac arrest.  Apparently, the patient was in his usual  state of health until this evening.  He and his wife were watching a  movie when he suddenly began to feel ill, diaphoretic, and lightheaded.  Subsequently, he grabbed his head in apparent pain, and passed out per  his wife.  EMS was activated.  On their arrival, the patient was noted  to be in ventricular fibrillation and required three defibrillations.  Normal sinus rhythm was restored, and the patient was transferred to the  emergency department for further evaluation.   On arrival, the patient was hemodynamically stable.  His initial EKG  revealed normal sinus rhythm with no evidence of acute injury or  ischemia.  There was evidence for a past or prior inferoposterior  infarct.  His initial cardiac biomarkers were negative.  A head CT was  obtained, which revealed no acute intracranial process.  The hypothermia  protocol was instituted in the emergency department, the patient was  transported to the CCU, where he has remained hemodynamically stable.   PAST MEDICAL HISTORY:  1. CAD.  Per the family, the patient has experienced an MI in 1981,      which was attributed to a clot.  The patient did have a      percutaneous intervention performed in the early 90s, although I do      not have the details of this  procedure.  2. Hypertension.  3. Hyperlipidemia.  4. Questionable history of a borderline hypercoagulable state.      Apparently, the patient has a borderline elevated anticardiolipin      antibody test.   HOME MEDICATIONS:  Atenolol, nitro paste, Niaspan, WelChol, and aspirin.   ALLERGIES:  No known drug allergies.   SOCIAL HISTORY:  The patient is married.  He has a daughter who is an  Charity fundraiser, who I spoke with this morning.  He is a retired Air cabin crew.  Denies smoking, alcohol, or illicit substances.   FAMILY HISTORY:  There is a general history of coronary artery disease  in the family.  He has a daughter who has antiphospholipid antibody  syndrome and has had multiple PEs.   PHYSICAL EXAMINATION:  VITAL SIGNS:  His blood pressure is 142/71, heart  rate is 40, and O2 sats 100% on room air.  In general, the patient is  intubated, sedated, and paralyzed.  HEENT:  Normocephalic and atraumatic.  EOMI.  PERRLA.  Nares  are patent.  OMP is clear.  NECK:  Supple, full range of motion, and no appreciable JVD.  CHEST:  Clear to auscultation bilaterally.  CARDIOVASCULAR:  Normal S1-S2 with no audible murmurs, rubs, or gallops.  His PMI is nondisplaced in the left midclavicular line.  Peripheral  pulses are 2+ and symmetric.  SKIN:  Cool, dry, and intact.  ABDOMEN:  Soft, nontender, and nondistended.  Positive bowel sounds.  No  hepatosplenomegaly.  GU:  Normal male genitalia.  EXTREMITIES:  Show no clubbing, cyanosis, or edema.  There is no  evidence of inflammation, ulceration, lesions, or petechiae.  MUSCULOSKELETAL:  No joint deformity or effusions.  NEUROLOGIC:  Unable to assess due to the sedated and paralytics day.   RADIOLOGY:  Head CT negative for acute intracranial process.   EKG, normal sinus rhythm with evidence of prior inferoposterior infarct.  There is no evidence of acute injury or ischemia.   LABORATORY DATA:  Sodium 137, potassium 2.8, chloride 103, CO2 is 22,   BUN is 19, and creatinine 0.74.  CBC; white count 8.8, hematocrit is 41,  and platelet count 122.  Cardiac biomarkers; troponin I is 0.09, CK-MB  is 4.4.  ABG; pH 7.46, pCO2 is 30, and pO2 is 90.8 on 40% FiO2.   IMPRESSION:  1. Cardiac arrest.  2. Coronary artery disease.  3. Hypertension.  4. Hyperlipidemia.  5. Questionable history of hypercoagulable state (antiphospholipid      antibodies).   RECOMMENDATIONS:  From a cardiovascular standpoint, [name] is  currently hemodynamically stable.  He is initiated on the hypothermia  protocol in the emergency department, and has achieved the goal of a  core temperature.  On review of his EKG, there is evidence of a prior  inferoposterior infarct, but no evidence of acute injury or ischemia.  His initial cardiac biomarkers have been negative x2, although I suspect  that in the setting of cardiac arrest his enzymes will elevate to some  degree.   At this point, the patient has no acute indication for cardiac  catheterization.  I recommend initiating on a fractionated heparin drip  and continuing aspirin therapy daily.  There is no role for 2b3a  inhibitor at this juncture.  This is somewhat unclear as to the etiology  of his arrest.  It maybe related to ischemia versus a primary  arrhythmogenic etiology.  After the patient is warmed and assessed from  a neurological standpoint, cardiac catheterization will likely be  warranted to rule out any progression of coronary artery disease.  If  there is no evidence of new coronary artery disease, the patient will  likely require electrophysiological testing and placement of ICD during  this admission.   On review of labs this a.m., he is hypokalemic, however, I suspect this  is related to aggressive volume expansion with potassium deficient  fluids and diuresis.  We would recommend checking a fasting lipid  profile, and would consider a statin therapy.  Currently, his heart is  sinus  bradycardia with a heart rate of 40, precludes treatment with a  beta-blocker.  I would recommend checking a transthoracic echocardiogram  to assess his left ventricular function and structure.  We will follow  along with you throughout his hospital course.  If there are any  additional questions, please feel free to contact us.      Audery Amel, MD  Electronically Signed     SHG/MEDQ  D:  03/29/2008  T:  03/29/2008  Job:  [id number] 

## 2011-04-26 NOTE — Procedures (Signed)
DUPLEX ULTRASOUND OF ABDOMINAL AORTA   INDICATION:  Followup of abdominal aortic aneurysm.   HISTORY:  Diabetes:  No.  Cardiac:  Pacemaker, defibrillator, MI 2009.  Hypertension:  No.  Smoking:  Previous.  Connective Tissue Disorder:  Family History:  No.  Previous Surgery:  No.   DUPLEX EXAM:         AP (cm)                   TRANSVERSE (cm)  Proximal             4.07 cm                   3.45 cm  Mid                  4.15 cm                   3.71 cm  Distal               4.22 cm                   4.07 cm  Right Iliac          0.62 cm                   1.27 cm  Left Iliac           0.81 cm                   0.99 cm   PREVIOUS:  Date:  09/09/2009  AP:  4.07  TRANSVERSE:  4.16   IMPRESSION:  1. Abdominal aortic aneurysm with largest measurement of 4.22 x 4.07      cm.  2. 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   ___________________________________________  Larina Earthly, M.D.   CJ/MEDQ  D:  02/24/2010  T:  02/24/2010  Job:  [id number]

## 2011-04-26 NOTE — Consult Note (Signed)
VASCULAR SURGERY CONSULTATION   Joel Lin, Joel Lin  DOB:  [birth year]                                       02/19/2009  VQQVZ#:[id number]   PRIMARY CARE PHYSICIAN:  Renford Dills, M.D.   CARDIOLOGIST:  Cassell Clement, MD   REFERRAL DIAGNOSIS:  A 4.4 cm abdominal aortic aneurysm.   HISTORY:  The patient is a 70 year old retiree, resident of [name]  who has a history of known gallstone disease.  He has over the past 4-6  weeks suffered from right flank discomfort.  This is not associated with  nausea or vomiting.  He does not associate this with meals.  Intermittent discomfort.  Bowel movements are regular.  No history of  peptic ulcer disease or GI bleeding.   Abdominal ultrasound does verify known gallstone disease.  His  gallbladder does function by HIDA scan.   Abdominal ultrasound also verifies a 4.1 cm x 4.4 cm abdominal aortic  aneurysm.   PAST MEDICAL HISTORY:  1. Coronary artery disease status post PCI 1999 and MI in 1981.  2. Ventricular fibrillation cardiac arrest April 2009 with AICD      placement.  3. Hyperlipidemia.  4. BPH.  5. Cardiomyopathy with EF 35% to 40%.   MEDICATIONS:  1. Atenolol 25 mg 2 tablets every morning, 1 tablet every evening.  2. Welchol 625 mg 3 tablets every morning, 3 tablets every evening.  3. Aspirin 81 mg daily.  4. Nitro-Dur 0.2 mg per hour.  5. Niaspan 500 mg daily.  6. Fish Oil 1000 mg two times daily.  7. Multivitamin 1 tablet daily.  8. Ibuprofen p.Lin.n.   ALLERGIES:  None known.   FAMILY HISTORY:  Significant for coronary artery disease in his father.   SOCIAL HISTORY:  The patient is married with 2 children.  He is a  retiree, having worked in [employer].  No tobacco use.  Discontinued tobacco in 1981.  Has one glass of red wine daily.   REVIEW OF SYSTEMS:  Refer to patient encounter form..  The patient  denies any complaints other than that of presenting illness.   PHYSICAL  EXAM:  General:  A well-appearing 70 year old male.  Alert and  oriented.  No distress.  Vital Signs:  BP 142/78, pulse 49 per minute,  respirations 18 per minute.  HEENT:  Unremarkable.  Neck:  Supple.  No  thyromegaly or adenopathy.  Chest:  Equal air entry bilaterally without  rales or rhonchi.  Cardiovascular:  Normal heart sounds without murmurs.  No gallops or rubs.  Regular rate and rhythm.  Abdomen:  Soft,  nontender, no masses or organomegaly.  Normal bowel sounds without  bruits.  Lower Extremities:  Intact femoral, popliteal, posterior tibial  and dorsalis pedis pulses.  No ankle edema.  Full range of motion.  Neurologic:  Cranial nerves intact.  Strength equal bilaterally.  Reflexes 1+.  Skin:  Warm, dry and intact.   IMPRESSION:  1. A 4.4 cm abdominal aortic aneurysm with intermittent right flank      pain.  2. Coronary artery disease.  3. Ischemic cardiomyopathy.  4. Ventricular fibrillation arrest with atrial implantable      cardioverter-defibrillator placement.  5. Hyperlipidemia.  6. Benign prostatic hypertrophy.   RECOMMENDATIONS:  The patient will undergo CT angiogram of abdomen and  pelvis to further evaluate AAA.  Provided this  is a fusiform aneurysm,  continued observation.   Continue current medications, plan follow-up in the office.   Balinda Quails, M.D.  Electronically Signed  PGH/MEDQ  D:  02/19/2009  T:  02/20/2009  Job:  [id number]   cc:   Deirdre Peer. Polite, M.D.  Cassell Clement, M.D.

## 2011-04-26 NOTE — Op Note (Signed)
Joel Lin, Joel Lin           ACCOUNT NO.:  [id number]   MEDICAL RECORD NO.:  [id number]          PATIENT TYPE:  AMB   LOCATION:  SDS                          FACILITY:  [facility]   PHYSICIAN:  Adolph Pollack, M.D.DATE OF BIRTH:  1941-02-11   DATE OF PROCEDURE:  08/13/2009  DATE OF DISCHARGE:                               OPERATIVE REPORT   PREOPERATIVE DIAGNOSIS:  Symptomatic cholelithiasis.   POSTOPERATIVE DIAGNOSIS:  Calculous cholecystitis.   PROCEDURE:  Laparoscopic cholecystectomy.   SURGEON:  Adolph Pollack, MD   ASSISTANT:  Amber L. Freida Busman, MD   ANESTHESIA:  General.   INDICATIONS:  Mr. Tiegs is a 70 year old male with fairly classic  biliary colic symptoms.  An ultrasound demonstrated cholelithiasis.  He  continues to have intermittent bouts although with a low-fat diet, these  have become less frequent.  He now presents for elective laparoscopic  cholecystectomy.  Procedure, risks, and aftercare were discussed and had  been discussed with him preoperatively.   TECHNIQUE:  He was seen in the holding area.  His defibrillating  pacemaker was deactivated.  He was then brought to the operating room,  placed supine on the operating table, and general anesthetic was  administered.  The hair of the abdominal wall was clipped and the  abdominal wall was sterilely prepped and draped.  In the subumbilical  region, Marcaine solution was infiltrated in the subcutaneous tissue.  A  subumbilical incision was made through the skin, subcutaneous tissue,  fascial layers, and peritoneum entering the peritoneal cavity under  direct vision.  A pursestring suture of 0-Vicryl was placed around the  fascial edges.  A Hasson trocar was introduced through the peritoneal  cavity and pneumoperitoneum was created by insufflation of CO2 gas.   Next, a laparoscope was introduced.  He was noted to have a partially  intrahepatic gallbladder and some chronic inflammatory changes of  gallbladder along with some omental adhesions to the gallbladder.   An 11-mm trocar was placed through an epigastric incision and two 5-mm  trocars were replaced in the right upper quadrant.  He was placed in  reverse Trendelenburg position with right side tilted slightly upward.  The fundus of the gallbladder was grasped and using the sharp and blunt  dissection on the gallbladder, adhesions between the gallbladder and  liver and in part some of the duodenum were then taken down with the  omentum and duodenum and stomach reduced back posteriorly.  The fundus  of the gallbladder was then retracted toward the right shoulder.  Using  blunt dissection, the infundibulum of the gallbladder was mobilized.  I  identified the cystic artery and then the cystic duct.  Windows were  created around both of these and the critical view was achieved.   I then put a clip at the cystic duct gallbladder junction and made a  small incision in the cystic duct and milked it back.  No obvious stones  were used.  I did attempt to put a cholangiocatheter posteriorly through  the cystic duct as we were right at a valve.  Also, he had somewhat of a  contrast  allergy, so we decided not to perform a cholangiogram.   The cholangiocatheter was removed.  The cystic duct was clipped 3 times  on the biliary side and divided.  The cystic artery was then clipped and  divided.  The gallbladder was dissected from the liver using  electrocautery.  There was spillage of 3 stones, all of which were  retrieved.  The gallbladder was then placed in an Endopouch bag.   The gallbladder fossa was then copiously irrigated with saline solution  and bleeding controlled with electrocautery.  Further inspection  demonstrated adequate hemostasis and no bile leak.  Surgicel was then  placed in the gallbladder fossa.   Irrigation fluids were evacuated as much as possible.  The gallbladder  was removed with the subumbilical port and sent  to pathology.  The  subumbilical fascia was closed under laparoscopic vision by tightening  up and tying down the pursestring suture.  The trocars were then removed  and released the pneumoperitoneum.  Skin incisions were closed with 4-0  Monocryl subcuticular stitches.  Steri-Strips and sterile dressing were  applied.   He tolerated the procedure without any apparent complications.  He was  taken to the recovery room in satisfactory condition where his  defibrillator will be re-activated.      Adolph Pollack, M.D.  Electronically Signed     TJR/MEDQ  D:  08/13/2009  T:  08/14/2009  Job:  [id number]   cc:   Deirdre Peer. Polite, M.D.  Cassell Clement, M.D.  Duke Salvia, MD, [facility]

## 2011-04-26 NOTE — Assessment & Plan Note (Signed)
OFFICE VISIT   PRESTEN, JOOST  DOB:  [birth year]                                       03/05/2009  [id number]#:[id number]   The patient returned to the office today with results of CT angiogram of  his abdomen.  This does verify a 4.1 cm fusiform abdominal aortic  aneurysm.  No surgical procedures required at this time.  Will plan to  monitor on a 6 monthly basis with ultrasound.   Balinda Quails, M.D.  Electronically Signed   PGH/MEDQ  D:  03/05/2009  T:  03/06/2009  Job:  [id number]   cc:   Deirdre Peer. Polite, M.D.  Cassell Clement, M.D.

## 2011-04-26 NOTE — Procedures (Signed)
EEG NUMBER:  [id number].   This is a 70 year old man who was admitted for cardiac arrest,  experienced sudden pain in his head, passed out.  EMS was called.  He  was found in v-fib, shocked three times and then sinus rhythm was  restored.  He has a history of coronary artery disease, hypertension,  hyperlipidemia.  He is having slow mentation.  The patient is on  Protonix, vancomycin, potassium, aspirin, Lopressor, Lasix, Zosyn,  heparin, fentanyl, Versed, Diprivan, Apresoline and Tylenol.   This is a portable 17 channel EEG with one channel devoted to EKG  utilizing the International 10/20 lead placement system.  The patient  was described as being awake and drowsy clinically.  Electrographically  he appeared to be awake.  The background consisted of a somewhat  disorganized, poorly modulated, but otherwise fairly well-developed 9 Hz  alpha activity just predominate in the posterior head region.  It is  little bit monotonous.  The patient was never requested to open his  eyes, so it is unclear whether or not this was reactive to eye opening.  No clear interhemispheric asymmetry is identified.  No definite  epileptiform discharges are seen.  The EKG monitor reveals relatively  regular rhythm with a rate of 72 beats per minute with occasional PVCs.  No activation procedures were performed.   CONCLUSION:  Essentially normal awake EEG without definite focal  abnormality or seizure activity seen during the course of today's  recording.  Clinical correlation is recommended.      Catherine A. Orlin Hilding, M.D.  Electronically Signed     ZOX:WRUE  D:  04/02/2008 11:42:30  T:  04/02/2008 12:18:47  Job #:  [id number]

## 2011-04-26 NOTE — Discharge Summary (Signed)
Joel Lin, GRAND           ACCOUNT NO.:  [id number]   MEDICAL RECORD NO.:  [id number]          PATIENT TYPE:  INP   LOCATION:  2030                         FACILITY:  [facility]   PHYSICIAN:  Joel Lin, M.D. DATE OF BIRTH:  August 04, 1941   DATE OF ADMISSION:  03/28/2008  DATE OF DISCHARGE:  04/08/2008                               DISCHARGE SUMMARY   FINAL DIAGNOSES:  1. Out of hospital cardiac arrest secondary to witnessed ventricular      fibrillation with successful resuscitation.  2. Respiratory failure and pulmonary collapse requiring intubation.  3. Anoxic brain damage, resolved.  4. Old myocardial infarction.  5. Coronary atherosclerosis.  6. Hyperlipidemia.   OPERATIONS PERFORMED:  1. Insertion of a cardioverter defibrillator, St. Jude on April 07, 2008, by Dr. Sherryl Lin.  2. Left heart cardiac catheterization with coronary angiography on      April 04, 2008, by Dr. Kristeen Lin.  3. Insertion of endotracheal tube by Dr. Tyson Lin and hypothymia      protocol post cardiac arrest by Dr. Tyson Lin.   HISTORY:  This is a 70 year old married Caucasian gentleman with a  history of a remote posterolateral myocardial infarction in 1981.  In  late 1990s, he had PCI by Dr. Elease Lin.  He had done well and was in his  usual state of health until evening of March 29, 2008, when he suddenly  began to feel ill, diaphoretic, lightheaded, grabbed his in pain, and  passed out.  EMS was activated and on the arrival he was noted be in  ventricular fibrillation, required 3 defibrillations with restoration of  normal sinus rhythm and the patient was transferred to the emergency  department for further evaluation.  The hypothymia protocol was  instituted in the emergency department and a head CT was obtained in the  emergency room, which showed no acute intracranial process.   The patient's home medications prior to the cardiac arrest were  atenolol, nitroglycerin paste,  Niaspan, WelChol, and aspirin.   FAMILY HISTORY:  Positive for antiphospholipid antibody syndrome in 1  daughter who has had multiple pulmonary emboli.   On admission, his blood pressure was 142/71, pulse was 40 on the  hypothymia protocol, O2 sats were 100%, and the patient was intubated,  sedated, and paralyzed.  The chest was clear.  Heart revealed no murmur,  gallop, or rub.   EKG showed normal sinus rhythm with evidence of a prior inferoposterior  infarct, but no evidence of any acute injury or ischemia and his initial  biomarkers were unremarkable with a troponin of 0.09 and CK-MB of 4.4.   HOSPITAL COURSE:  The patient was initiated on the hypothymia protocol  and achieved the goal of a low core temperature.  The patient remained  on the ventilator for several days and remained stable and he had no  further arrhythmias.  He was followed very closely by pulmonary critical  care consultants.  On April 01, 2008, the patient was extubated.  Once  extubated, it appeared that he had not suffered any significant  neurological insult as a result of his out  of hospital cardiac arrest.  He was seen in consultation by Dr. Sharene Lin.  The patient had a  transthoracic echocardiogram on March 29, 2008, which showed an ejection  fraction of 35-40% with akinesis of inferoposterior wall consistent with  his remote known infarct and there was no evidence of mitral  regurgitation.  The patient underwent cardiac catheterization on April 05, 2008, by Dr. Kristeen Lin who found evidence of severe diffuse  disease in the circumflex, but that this vessel was small.  The LAD was  found to be widely patent.  It was felt that the patient did not need  percutaneous intervention and that medical therapy of his coronary  disease would be appropriate.  Dr. Berton Lin saw the patient in regard  to placement of an ICD and this was accomplished on April 07, 2008.  It  is a Biomedical scientist.  It is a single  chamber ICD implanted because of  out of hospital ventricular fibrillation cardiac arrest.  The patient  tolerated the procedure well.  By April 08, 2008, he was well enough to  be discharged home.  He was discharged on atenolol 25 mg b.i.d., Welchol  3 tablets b.i.d., nitroglycerin patch 2.2 mg per hour, and aspirin 325  mg daily.  He is to follow up with Dr. Patty Lin in 2 weeks and he is to  call for followup with Dr. Graciela Lin for his defibrillator.   CONDITION ON DISCHARGE:  Improved.           ______________________________  Joel Lin, M.D.     TB/MEDQ  D:  06/02/2008  T:  06/03/2008  Job:  [id number]   cc:   Joel Lin. Joel Lin, M.D.  Duke Salvia, MD, [facility]  Vesta Mixer, M.D.  Dr. Tyson Lin

## 2011-04-26 NOTE — Procedures (Signed)
DUPLEX ULTRASOUND OF ABDOMINAL AORTA   INDICATION:  Followup abdominal aortic aneurysm.   HISTORY:  Diabetes:  No.  Cardiac:  Pacemaker, defibrillator, MI 12/23/1979, congestive heart  failure.  Hypertension:  No.  Smoking:  Previous.  Connective Tissue Disorder:  Family History:  No.  Previous Surgery:  No.   DUPLEX EXAM:         AP (cm)                   TRANSVERSE (cm)  Proximal             4.08 cm                   4.17 cm  Mid                  3.94 cm                   3.99 cm  Distal               4.26 cm                   4.29 cm  Right Iliac          0.93 cm                   0.90 cm  Left Iliac           0.90 cm                   1.06 cm   PREVIOUS:  Date:  02/24/2010  AP:  4.22  TRANSVERSE:  4.07   IMPRESSION:  1. Abdominal aortic aneurysm with largest measurement of 4.26 x 4.29      cm.  2. Size has increased slightly from previous study.  3. 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   ___________________________________________  Larina Earthly, M.D.   EM/MEDQ  D:  11/09/2010  T:  11/09/2010  Job:  [id number]

## 2011-04-26 NOTE — Assessment & Plan Note (Signed)
OFFICE VISIT   Joel Lin, Joel Lin  DOB:  10-06-1941                                       11/09/2010  [name]   This patient presents today for continued follow-up of his infrarenal  abdominal aortic aneurysm.  He had this found as an initial incidental  finding at the time of ultrasound of the gallbladder in  March of 2010.  He has had several ultrasound follow-ups of this and also a CT scan  looking at this as well.  He had been followed with Dr. Liliane Bade in  our office and I  discuss this with this patient and his wife that Dr.  Madilyn Fireman left our practice, and that I will be following his aneurysm.Marland Kitchen  He  has no symptoms referable to his aneurysm.  He has remained stable.  He  does have significant cardiac history with no new difficulty but prior  issues with defibrillator and pacemaker placement, myocardial infarction  at a very young age in 73 at age 62.   PHYSICAL EXAMINATION:  General:  A well developed, well nourished white  male appearing stated age in no acute distress.  Blood pressure 127/80,  pulse 49, respirations 18.  HEENT:  Normal.  Chest:  Clear bilaterally  without rhonchi or wheezes.  Heart:  Regular rate and rhythm.  Carotids  without bruits bilaterally.  Abdomen:  Soft, nontender.  He does have  easily palpable aneurysm which is nontender.  Musculoskeletal:  Shows no  major deformities or cyanosis.  Neurologic:  No focal weakness or  paresthesias.  Skin:  Without ulcers or rashes.  He does have 2+  femoral, 2+ popliteal and 2+ posterior tibial pulses without evidence of  peripheral aneurysm.   He underwent ultrasound  today and I have reviewed this with this  patient.  This shows no significant change.  His last ultrasound in  March 2011 showed 4.2-cm aneurysm; today it is 4.3.  I discussed the  significance of this with this patient and have recommended we see him  on a yearly basis for ultrasound follow-up. I explained  the indications  for surgery and the treatment of open end stent graft repair.  I also  explained symptoms of leaking aneurysm.  He knows to report immediate to  [facility] should this occur.  We will see him again in 1  year for repeat ultrasound.     Larina Earthly, M.D.  Electronically Signed   TFE/MEDQ  D:  11/09/2010  T:  11/10/2010  Job:  [id number]   cc:   Deirdre Peer. Polite, M.D.  Cassell Clement, M.D.

## 2011-04-26 NOTE — Letter (Signed)
July 29, 2008    Cassell Clement, MD  [address]  [place], [place] [address]   RE:  SELDEN, NOTEBOOM  MRN:  [id number]  /  DOB:  1941-10-20   Dear Elijah Birk,   Mr. Leete comes in today following his aborted cardiac arrest and he  has had no intercurrent problems.  He did have some PVCs during the  rehab.  Apparently, you have adjusted his atenolol at that time and he  has had no recurrent issues.   His other medications are notable for the absence of an ACE inhibitor.   On examination, his blood pressure is 124/36 with a pulse of 51.  His  lungs were clear.  His heart sounds were regular.  Neck veins were flat.  His extremities were without edema.   Interrogation of St. Jude, current ICD demonstrates a R-wave of 12 with  impedance of 410, a threshold 0.7-5.5 with a high voltage impedance of  50 ohms and battery voltage 3.2.   IMPRESSION:  1. Aborted cardiac arrest.  2. Ischemic heart disease with ejection fraction of 35%.  3. Premature ventricular contractions.  4. Relative bradycardia.   Mr. Gazda is doing well.  We will plan to see him again in 9 months'  time.  We will begin on Merlin remote followup.  I  have talked about  the role of ACE inhibitor therapy.    Sincerely,      Duke Salvia, MD, [facility]  Electronically Signed    SCK/MedQ  DD: 07/29/2008  DT: 07/30/2008  Job #: [id number]

## 2011-04-26 NOTE — Procedures (Signed)
DUPLEX ULTRASOUND OF ABDOMINAL AORTA   INDICATION:  Follow up abdominal aortic aneurysm.   HISTORY:  Diabetes:  No.  Cardiac:  Pacemaker and defibrillator, MI in 2009.  Hypertension:  No.  Smoking:  Previous.  Quit in 1981.  Connective Tissue Disorder:  No.  Family History:  No.  Previous Surgery:  No.   DUPLEX EXAM:         AP (cm)                   TRANSVERSE (cm)  Proximal             2.13 cm                   2.33 cm  Mid                  4.07 cm                   4.16 cm  Distal                 3.78 cm                   3.65cm  Right Iliac          1.30 cm                   1.40 cm  Left Iliac           1.12 cm                   1.33 cm   PREVIOUS:  4.1, per CT   IMPRESSION:  Abdominal aortic aneurysm noted with the largest  measurement of 4.07 X 4.16 cm.   ___________________________________________  Larina Earthly, M.D.   MG/MEDQ  D:  09/09/2009  T:  09/09/2009  Job:  [id number]

## 2011-04-26 NOTE — Consult Note (Signed)
[name], Lin           ACCOUNT NO.:  [id number]   MEDICAL RECORD NO.:  [id number]          PATIENT TYPE:  INP   LOCATION:  2905                         FACILITY:  [facility]   PHYSICIAN:  Deanna Artis. Hickling, M.D.DATE OF BIRTH:  1941/03/07   DATE OF CONSULTATION:  04/01/2008  DATE OF DISCHARGE:                                 CONSULTATION   CLINICAL HISTORY:  The patient is a 70 year old right-handed Caucasian  male who suffered a cardiac arrest at home and was without resuscitation  anywhere from 7-10 minutes before EMS arrived.   The patient was watching movie when he became ill, was diaphoretic, and  lightheaded.  He grabbed his head in apparent pain and lost  consciousness.  The patient was noted to be in ventricular fibrillation  required defibrillation x3.  His wife remembers him clutching his tongue  between his teeth and remembers grabbing onto his face to try to keep  him awake and alert.   The patient was unresponsive at the time and a normal sinus rhythm was  restored.   The patient was hemodynamically stable.  He had a CT scan of the brain  that failed to show any evidence of intracranial abnormality including  stroke or evidence of cerebral edema.  The patient had a subsequent CT  scan that was portable and difficult to interpret as portable studies  are but in my opinion even the radiologist was negative.   This is compared with the previous CT scan on March 29, 2008, that was  clearly normal.   I was asked to see the patient after he was treated with hypothermia  protocol to determine his cognitive abilities and make recommendation to  discuss prognosis.   PAST MEDICAL HISTORY:  Remarkable for coronary artery disease, which  occurred in 1981.  The patient had percutaneous angioplasty in 1990's.  He has a history of hypertension, dyslipidemia, and borderline  hypercoagulable state.   MEDICATIONS:  His home medications include atenolol, Nitropaste,  Niaspan, WelChol, and aspirin.   DRUG ALLERGIES:  None known.   REVIEW OF SYSTEMS:  Twelve system review is unremarkable for significant  problems other than myopia.   PAST SURGICAL HISTORY:  Remarkable only for placement of a stent.   FAMILY HISTORY:  Both his parents are alive in their 90s.  His father  has heart disease.  His mother has no serious illness.  There is a  family history of coronary artery disease.  The patient's daughter has  antiphospholipid antibody syndrome and has had multiple pulmonary  emboli.   SOCIAL HISTORY:  The patient is married.  He has a daughter who is an  Charity fundraiser, who spoke tonight.  He quit smoking in 1981.  He does not use  alcohol or drugs.   PHYSICAL EXAMINATION:  GENERAL:  Tonight, the patient was pleasant and  smiles when I arrived.  VITAL SIGNS:  Blood pressure 126/55, resting pulse 79, respirations 12,  oxygen saturation 98% on 2 liters of oxygen, temperature 98.4.  EAR, NOSE, THROAT:  No bruits.  NECK:  Supple.  LUNGS:  Clear.  HEART:  No murmurs.  Pulses normal.  ABDOMEN:  Soft.  Bowel sounds normal.  No hepatosplenomegaly.  EXTREMITIES:  Unremarkable.  NEUROLOGIC:  The patient says that it is February.  He tells me he is 70  years old (he is 79).  He knows his date of birth, the number of years  he is married (86).  He cannot tell me the name of his degree though he  told me he had a bachelor of science.  He told me both his where he  works currently and his previous job.  He knew who is a current  president was, but did not know his upon during the general action.  He  is not able subtract 7 from 100 nor spell the word world (word, wold).  CRANIAL NERVES:  Round reactive pupils.  Fundi are slightly pale.  Disks  are sharp.  Visual fields are full to double simultaneous stimuli.  Symmetric facial strength midline tongue and uvula.  Air conduction  greater than bone conduction bilaterally.   Motor examination normal strength.  Fine motor  movements were somewhat  slow and clumsy.  There was no drift.  Sensory examination withdrawal  x4, good stereognosis.  He was thinking slowly.  Cerebellar examination,  finger-to-nose was showed mild tremor.  Rapid repetitive movements were  slow.  Gait was not tested.  He is areflexic. He has neutral plantar  responses.   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 (348.1)  He has problems with orientation, spelling, memory, both immediate and  an intermediate calculations.  He has no problems with naming,  repeating, or following commands at least for 2 steps.  He was thinking  slowly.  He has a flat affect.  He has bitten his tongue.  He may have  had a seizures resolve with hypoxia.  I am worried about the possibility  of a left focal seizure described by his daughter.   PLAN:  1. EEG.  2. MRI of the brain without contrast before the AICD is placed.  3. No antiepileptic drugs for now.  4. Speech Therapy consult for cognitive evaluation after the AICD is      placed and after he has a chance to recover.   His prognosis is guarded.  I have discussed this in length with his wife  and daughter.  If you have questions or I could be of assistance, do not  hesitate to contact me.      Deanna Artis. Sharene Skeans, M.D.  Electronically Signed     WH[facility]/MEDQ  D:  04/01/2008  T:  04/02/2008  Job:  [id number]   cc:   Nelda Bucks, MD

## 2011-04-26 NOTE — Cardiovascular Report (Signed)
NAMEIZEAR, PINE NO.:  [id number]   MEDICAL RECORD NO.:  [id number]          PATIENT TYPE:  INP   LOCATION:  2030                         FACILITY:  [facility]   PHYSICIAN:  Vesta Mixer, M.D. DATE OF BIRTH:  18-Jun-1941   DATE OF PROCEDURE:  04/04/2008  DATE OF DISCHARGE:                            CARDIAC CATHETERIZATION   Joel Lin is a 70 year old gentleman with a history of coronary  artery disease status post a remote myocardial infarction in the 1980s.  He is status post PTCA and stenting of his left anterior descending  artery in 1999.  He presents to the hospital after having an out of  hospital cardiac arrest.  He has made tremendous progress and is now  referred for heart catheterization prior to receiving an AICD.   PROCEDURE:  Left heart catheterization with coronary angiography.   The right femoral artery was easily cannulated using a modified  Seldinger technique.  There was some difficulty getting in up past the  distal aorta.  It appears that he has a shelf of plaque there.  We used  guide wires and traded the catheters out over the wire for subsequent  catheter placement.   HEMODYNAMICS:  LV pressure is 124/70 with an aortic pressure of 128/65.   Angiography left main.  The left main is mildly to moderately calcified.  There is a 25% distal stenosis.   The left anterior descending artery is mildly calcified.  It is fairly  normal in the proximal segment.  The LAD has a band, which represents  approximately 20% stenosis just prior to the stent.  The stent is widely  patent.  The remaining mid and the distal LAD are unremarkable.   The first diagonal artery is fairly large branch, which is normal.   The left circumflex artery starts off as a fairly large vessel.  It  gives off a first obtuse marginal artery, which is severely and  diffusely diseased.  The stenosis ranges between 90% initially.  There  is a sequential 80% followed  by another 70-80% stenosis going down into  the distal circumflex marginal.  The vessel diameter appears to be  around 2 mm or most likely less than 2 mm and really does not appear to  be a good candidate for stenting.   The right coronary artery is moderate in size and is dominant.  There  are minor luminal irregularities throughout the RCA.  There is a distal  20-30% stenosis.  The posterior descending artery and the posterolateral  segment artery are normal.   The left ventriculogram reveals moderately reduced left ventricular  systolic function.  The ejection fraction is around 35%.  There is  inferolateral akinesis.  There is some dyssynchrony.   COMPLICATIONS:  None.   CONCLUSION:  Coronary artery disease primarily involving the LAD in the  circumflex artery.  The LAD stent is widely patent and this vessel  appears to be very nice.   The circumflex artery has severe diffuse disease in the first obtuse  marginal artery.  This vessel is fairly small and really he is not a  good  candidate for stenting for numerous reasons.  It is less than 2 mm,  although we could put a 2-mm stent in it.  He has an allergy to Plavix,  which may make this difficult.  We would need to use a non drug-eluting  stent, but a non drug-eluting stent this small in diameter would have a  high likelihood of re-stenosis.  In addition, he has been basically  asymptomatic and his lesions do not appear to be acute.  I suspect that  he will be asymptomatic and we will treat these medically.  If he does  have chest pain then we could consider doing stenting of the circumflex  marginal.  My suggestion is that we proceed with AICD placement  plus/minus biventricular pacer.  Dr. Graciela Husbands will see on Monday.           ______________________________  Vesta Mixer, M.D.     PJN/MEDQ  D:  04/04/2008  T:  04/05/2008  Job:  [id number]   cc:   Cassell Clement, M.D.  Duke Salvia, MD, [facility]

## 2011-04-26 NOTE — Op Note (Signed)
Joel Lin, Joel Lin           ACCOUNT NO.:  [id number]   MEDICAL RECORD NO.:  [id number]          PATIENT TYPE:  INP   LOCATION:  2030                         FACILITY:  [facility]   PHYSICIAN:  Duke Salvia, MD, FACCDATE OF BIRTH:  Jan 31, 1941   DATE OF PROCEDURE:  04/07/2008  DATE OF DISCHARGE:                               OPERATIVE REPORT   PREOPERATIVE DIAGNOSIS:  Aborted cardiac arrest.   POSTOPERATIVE DIAGNOSIS:  Aborted cardiac arrest.   PROCEDURE:  Single-chamber defibrillator implantation with  intraoperative defibrillation threshold testing.   Following the obtained informed consent, the patient was brought to the  Electrophysiology Laboratory and placed on the fluoroscopic table in the  supine position.  After routine prep and drape of the left upper chest,  lidocaine was then infiltrated in the prepectoral subclavicular region.  An incision was made and carried down through the layers of prepectoral  fascia with electrocautery and sharp dissection.  A pocket was performed  similarly.  Hemostasis was obtained.   Thereafter, attention was turned to gain access to the extrathoracic  left subclavian vein, which was accomplished without difficulty.  I did  puncture the artery on one occasion and held pressure for 2 minutes.  Thereafter, a venous puncture was accomplished and an 8-French sheath  was placed and a Durata 7121 dual-coil active defibrillator lead, serial  #[id number] was manipulated under fluoroscopic guidance to the right  ventricular apex where  the bipolar R-wave is 13.7 mV with a pace  impedance of 641  ohms and threshold 0.5 V at 0.5 milliseconds.  The  current threshold was 0.4 MA.  There was no diaphragmatic pacing at 10 V  and the current of injury was brisk.   The lead was secured in prepectoral fascia and then attached with  Current VRRF 1207 device, serial [id number], where through the device of  bipolar R-wave was greater than 12 with a pace  impedance of 550 ohms of  threshold 0.5 V in 0.5 milliseconds and the high-voltage impedance was  45 ohms.   With these acceptable parameters recorded, defibrillation threshold  testing was undertaken.  Ventricular fibrillation was induced via T-wave  shock.  After total duration of 6 seconds, a 15 joule shock was  delivered to the measured resistance of 42 ohms, terminating ventricular  fibrillation and restoring sinus rhythm.  The pocket was copiously  irrigated with antibiotic-containing saline solution.  The device and  the lead were secured at the prepectoral fascia.  There was a tad of  oozing cephalad portion of the pocket, I placed some Surgicel in this  region and the wound was  then closed in 3 layers in the normal fashion.  The wound was washed,  dried, and a Benzoin and Steri-Strip dressing was applied.  Needle  count, sponge count, and instruments counts were correct at the end of  procedure according to the staff.  The patient tolerated the procedure  without apparent complications.      Duke Salvia, MD, [facility]  Electronically Signed     SCK/MEDQ  D:  04/07/2008  T:  04/08/2008  Job:  [id number]  cc:   Cassell Clement, M.D.  Electrophysiology Laboratory  [facility]

## 2011-04-28 ENCOUNTER — Ambulatory Visit (INDEPENDENT_AMBULATORY_CARE_PROVIDER_SITE_OTHER): Payer: Medicare Other | Admitting: *Deleted

## 2011-04-28 DIAGNOSIS — I4901 Ventricular fibrillation: Secondary | ICD-10-CM

## 2011-05-03 ENCOUNTER — Other Ambulatory Visit: Payer: Self-pay | Admitting: *Deleted

## 2011-05-03 DIAGNOSIS — E559 Vitamin D deficiency, unspecified: Secondary | ICD-10-CM

## 2011-05-03 DIAGNOSIS — R5381 Other malaise: Secondary | ICD-10-CM

## 2011-05-03 DIAGNOSIS — E7800 Pure hypercholesterolemia, unspecified: Secondary | ICD-10-CM

## 2011-05-04 ENCOUNTER — Other Ambulatory Visit (INDEPENDENT_AMBULATORY_CARE_PROVIDER_SITE_OTHER): Payer: Medicare Other | Admitting: *Deleted

## 2011-05-04 DIAGNOSIS — E7800 Pure hypercholesterolemia, unspecified: Secondary | ICD-10-CM

## 2011-05-04 DIAGNOSIS — R5383 Other fatigue: Secondary | ICD-10-CM

## 2011-05-04 DIAGNOSIS — E559 Vitamin D deficiency, unspecified: Secondary | ICD-10-CM

## 2011-05-04 DIAGNOSIS — R5381 Other malaise: Secondary | ICD-10-CM

## 2011-05-04 LAB — BASIC METABOLIC PANEL
CO2: 31 mEq/L (ref 19–32)
Calcium: 8.8 mg/dL (ref 8.4–10.5)
Creatinine, Ser: 1 mg/dL (ref 0.4–1.5)
GFR: 79.69 mL/min (ref >60.00)
Glucose, Bld: 110 mg/dL — ABNORMAL HIGH (ref 70–99)
Sodium: 139 mEq/L (ref 135–145)

## 2011-05-04 LAB — HEPATIC FUNCTION PANEL
ALT: 24 U/L (ref 0–53)
AST: 29 U/L (ref 0–37)
Albumin: 3.8 g/dL (ref 3.5–5.2)
Alkaline Phosphatase: 55 U/L (ref 39–117)
Total Bilirubin: 0.8 mg/dL (ref 0.3–1.2)

## 2011-05-04 LAB — LIPID PANEL
HDL: 42.2 mg/dL (ref >39.00)
Total CHOL/HDL Ratio: 4
Triglycerides: 118 mg/dL (ref 0.0–149.0)

## 2011-05-04 NOTE — Progress Notes (Signed)
icd remote check  

## 2011-05-06 ENCOUNTER — Encounter: Payer: Self-pay | Admitting: *Deleted

## 2011-05-10 ENCOUNTER — Ambulatory Visit (INDEPENDENT_AMBULATORY_CARE_PROVIDER_SITE_OTHER): Payer: Medicare Other | Admitting: Cardiology

## 2011-05-10 ENCOUNTER — Encounter: Payer: Self-pay | Admitting: Cardiology

## 2011-05-10 DIAGNOSIS — I4901 Ventricular fibrillation: Secondary | ICD-10-CM

## 2011-05-10 DIAGNOSIS — E782 Mixed hyperlipidemia: Secondary | ICD-10-CM

## 2011-05-10 DIAGNOSIS — I4949 Other premature depolarization: Secondary | ICD-10-CM

## 2011-05-10 DIAGNOSIS — I2510 Atherosclerotic heart disease of native coronary artery without angina pectoris: Secondary | ICD-10-CM

## 2011-05-10 DIAGNOSIS — I493 Ventricular premature depolarization: Secondary | ICD-10-CM

## 2011-05-10 DIAGNOSIS — I714 Abdominal aortic aneurysm, without rupture, unspecified: Secondary | ICD-10-CM

## 2011-05-10 NOTE — Assessment & Plan Note (Signed)
He has been gaining a few pounds and has not been quite as careful with his diet.  His LDL is up slightly and he will work harder on diet.  He and his wife are members of [employer].  The patient is walking for exercise and hopes to do more walking in the coming months.

## 2011-05-10 NOTE — Assessment & Plan Note (Signed)
The patient has a remote posterolateral myocardial infarction in 1981.  He had angioplasty and stent of his LAD in 1999.  He had aborted sudden cardiac death with out of hospital cardiac arrest and resuscitation in 2009 with subsequent implantation of a cardiac defibrillator.  He has been feeling well.  He's not been having any shocks from his defibrillator.  He's not having any exertional chest pain.  His last nuclear stress test was 04/19/10 and showed an ejection fraction of 53% and no reversible ischemia but he does have an area of large scar in the inferolateral wall.   On his most recent study his ejection fraction  had risen from 43% to 53%When compared to the time before.

## 2011-05-10 NOTE — Assessment & Plan Note (Signed)
The patient has had no shocks from his defibrillator 

## 2011-05-10 NOTE — Progress Notes (Signed)
Joel Lin Date of Birth:  12-Sep-1941 [facility] [address]   [address] [name], [place]  [address] [phone]           Fax   [phone]  History of Present Illness: This 70 year old gentleman is seen for a scheduled followup office visit.  He has a history of known ischemic heart disease to he had an posterolateral myocardial infarction in 1981.  He had angioplasty and stenting of his LAD in 1999.  In 2009 he had an aborted sudden cardiac death with out of hospital cardiac arrest and resuscitation and use of Arctic sun protocol and went on to have subsequent implantation of a cardiac defibrillator.  He has not been expressing any recurrent angina.  He's had no shocks from his defibrillator.  His last nuclear stress test was on 04/19/10 with a LexiScan and his ejection fraction was 53% which was a improvement of 10% over the previous study and the patient was noted to have no reversible ischemia but he does have a large fixed area of old myocardial infarction in the inferolateral wall.  He did have regional wall motion abnormalities secondary to the scar.  The patient has a past history of symptomatic PVCs.  Presently he is adequately controlled on very tiny dose of atenolol 12.5 mg once a day.  Patient has a history of hypercholesterolemia and is intolerant of statins but is On WelChol, aspirin, and fish oil.  Current Outpatient Prescriptions  Medication Sig Dispense Refill  . aspirin 81 MG tablet Take 81 mg by mouth daily.        Marland Kitchen atenolol (TENORMIN) 25 MG tablet Take 12.5 mg by mouth daily.        . colesevelam (WELCHOL) 625 MG tablet Take 1,875 mg by mouth 2 (two) times daily with a meal.       . IBUPROFEN PO Take 1-2 tablets by mouth as needed.        . multivitamin (THERAGRAN) per tablet Take 1 tablet by mouth daily.        . niacin (NIASPAN) 1000 MG CR tablet Take 1,000 mg by mouth at bedtime.        . nitroGLYCERIN (NITRODUR - DOSED IN MG/24  HR) 0.2 mg/hr Place 1 patch onto the skin daily.        . nitroGLYCERIN (NITROSTAT) 0.4 MG SL tablet Place 0.4 mg under the tongue every 5 (five) minutes as needed.        . Omega-3 Fatty Acids (FISH OIL PO) Take 1 capsule by mouth 2 (two) times daily.          Allergies  Allergen Reactions  . Clopidogrel Bisulfate   . Iodine     Patient Active Problem List  Diagnoses  . HYPERLIPIDEMIA, MIXED  . CORONARY ATHEROSCLEROSIS  . VENTRICULAR FIBRILLATION  . PVC's (premature ventricular contractions)  . Abdominal aortic aneurysm    History  Smoking status  . Former Smoker -- 1.0 packs/day for 20 years  . Types: Cigarettes  . Quit date: 12/23/1979  Smokeless tobacco  . Never Used    History  Alcohol Use No    Family History  Problem Relation Age of Onset  . Coronary artery disease      strong family history of     Review of Systems: Constitutional: no fever chills diaphoresis or fatigue or change in weight.  Head and neck: no hearing loss, no epistaxis, no photophobia or visual disturbance. Respiratory: No cough,  shortness of breath or wheezing. Cardiovascular: No chest pain peripheral edema, palpitations. Gastrointestinal: No abdominal distention, no abdominal pain, no change in bowel habits hematochezia or melena. Genitourinary: No dysuria, no frequency, no urgency, no nocturia. Musculoskeletal:No arthralgias, no back pain, no gait disturbance or myalgias. Neurological: No dizziness, no headaches, no numbness, no seizures, no syncope, no weakness, no tremors. Hematologic: No lymphadenopathy, no easy bruising. Psychiatric: No confusion, no hallucinations, no sleep disturbance.    Physical Exam: Filed Vitals:   05/10/11 1032  BP: 110/70  Pulse: 56  The general appearance reveals a healthy-appearing gentleman in no distress.Pupils equal and reactive.   Extraocular Movements are full.  There is no scleral icterus.  The mouth and pharynx are normal.  The neck is supple.   The carotids reveal no bruits.  The jugular venous pressure is normal.  The thyroid is not enlarged.  There is no lymphadenopathy.The chest is clear to percussion and auscultation. There are no rales or rhonchi. Expansion of the chest is symmetrical.The precordium is quiet.  The first heart sound is normal.  The second heart sound is physiologically split.  There is no murmur gallop rub or click.  There is no abnormal lift or heaveThe abdomen is soft and nontender. Bowel sounds are normal. The liver and spleen are not enlarged. There Are no abdominal masses. There are no bruits.  Normal extremity.  Pedal pulses are good.  No phlebitis or edema.The skin is warm and dry.  There is no rash.Strength is normal and symmetrical in all extremities.  There is no lateralizing weakness.  There are no sensory deficits.   Assessment / Plan: We reviewed his labs.  His vitamin D level is low and he will start some over-the-counter vitamin D 3 2000 units daily.  Continue other medicines as is.  Recheck in 6 months for followup office visit and lab work.

## 2011-05-11 ENCOUNTER — Telehealth: Payer: Self-pay | Admitting: *Deleted

## 2011-05-11 NOTE — Telephone Encounter (Signed)
Mailed patient copy of labs

## 2011-05-19 ENCOUNTER — Encounter: Payer: Self-pay | Admitting: *Deleted

## 2011-06-02 ENCOUNTER — Telehealth: Payer: Self-pay | Admitting: *Deleted

## 2011-06-06 NOTE — Telephone Encounter (Signed)
Pt sent merlin transmission and transmission was normal.

## 2011-07-18 ENCOUNTER — Other Ambulatory Visit: Payer: Self-pay | Admitting: *Deleted

## 2011-07-18 MED ORDER — COLESEVELAM HCL 625 MG PO TABS: 1875.0000 mg | ORAL_TABLET | Freq: Two times a day (BID) | ORAL | Status: DC

## 2011-07-18 NOTE — Telephone Encounter (Signed)
Refilled welchol to [facility]

## 2011-07-20 ENCOUNTER — Other Ambulatory Visit: Payer: Self-pay | Admitting: *Deleted

## 2011-07-20 DIAGNOSIS — E7800 Pure hypercholesterolemia, unspecified: Secondary | ICD-10-CM

## 2011-07-20 MED ORDER — COLESEVELAM HCL 625 MG PO TABS: 1875.0000 mg | ORAL_TABLET | Freq: Two times a day (BID) | ORAL | Status: DC

## 2011-07-20 NOTE — Telephone Encounter (Signed)
Refilled welchol.

## 2011-07-28 ENCOUNTER — Encounter: Payer: Medicare Other | Admitting: *Deleted

## 2011-08-30 ENCOUNTER — Encounter: Payer: Self-pay | Admitting: Internal Medicine

## 2011-08-30 ENCOUNTER — Ambulatory Visit (INDEPENDENT_AMBULATORY_CARE_PROVIDER_SITE_OTHER): Payer: Medicare Other | Admitting: Internal Medicine

## 2011-08-30 DIAGNOSIS — I493 Ventricular premature depolarization: Secondary | ICD-10-CM

## 2011-08-30 DIAGNOSIS — I4901 Ventricular fibrillation: Secondary | ICD-10-CM

## 2011-08-30 DIAGNOSIS — I4949 Other premature depolarization: Secondary | ICD-10-CM

## 2011-08-30 LAB — ICD DEVICE OBSERVATION
BATTERY VOLTAGE: 3.0982 V
BRDY-0002RV: 40 {beats}/min
FVT: 0
RV LEAD AMPLITUDE: 8.1 mv
RV LEAD IMPEDENCE ICD: 325 Ohm
RV LEAD THRESHOLD: 1.5 V
TOT-0010: 16
TZAT-0001FASTVT: 1
TZAT-0013FASTVT: 1
TZAT-0018SLOWVT: NEGATIVE
TZAT-0019SLOWVT: 7.5 V
TZAT-0020FASTVT: 1 ms
TZON-0003SLOWVT: 330 ms
TZON-0005FASTVT: 6
TZON-0010FASTVT: 80 ms
TZON-0010SLOWVT: 80 ms
TZST-0001FASTVT: 2
TZST-0001SLOWVT: 2
TZST-0003FASTVT: 30 J
TZST-0003FASTVT: 36 J
TZST-0003FASTVT: 36 J
TZST-0003SLOWVT: 36 J
VF: 2

## 2011-08-30 NOTE — Assessment & Plan Note (Addendum)
[facility]                        ELECTROPHYSIOLOGY OFFICE NOTE  DALEN, HENNESSEE                  MRN:          [id number] DATE:08/30/2011                            DOB:          06-06-1941   Mr. Zakrzewski was seen in followup for an ICD implanted in the setting of ischemic heart disease.  He denies problem with the chest pain or shortness of breath.  His concern is related to being able to feel the lead of his device.  CURRENT MEDICATIONS:  Fish oil, Niaspan, aspirin, and nitroglycerin patch.  PHYSICAL EXAMINATION:  VITAL SIGNS:  His blood pressure 128/79.  His pulse is 61. NECK:  His neck veins are flat. LUNGS:  Clear. HEART:  Heart sounds are regular. EXTREMITIES:  Without edema.  Interrogation of his St. Jude device demonstrated no intercurrent therapies.  His device was reprogramed to increase the number of intervals to detect.  The R-wave via his St. Jude device demonstrated 8.1 with a threshold of 0.5 at 0.4.  The impedance was 330.  High- voltage impedance was 46.  I should note that the device pocket was well healed.  There was no evidence of a protruding lead.  IMPRESSION: 1. Ischemic cardiomyopathy. 2. Status post implantable cardioverter-defibrillator for the above. 3. My recollection is that he had aborted sudden cardiac death.  The     chart is not available right now. 4. Intercurrent weight loss - intentional. 5. Fatigue, improved with decrease of his atenolol.  Mr. Matters is doing well.  We will continue him on his current medications.  We will see him again in 12 months' time.  He will be followed remotely in the interim.    Duke Salvia, MD, [facility] Electronically Signed   SCK/MedQ  DD: 08/30/2011  DT: 08/30/2011  Job #: [id number]

## 2011-08-30 NOTE — Patient Instructions (Signed)
Your physician wants you to follow-up in: 1 year  You will receive a reminder letter in the mail two months in advance. If you don't receive a letter, please call our office to schedule the follow-up appointment.  Your physician recommends that you continue on your current medications as directed. Please refer to the Current Medication list given to you today.  

## 2011-09-06 LAB — URINALYSIS, ROUTINE W REFLEX MICROSCOPIC
Bilirubin Urine: NEGATIVE
Ketones, ur: 15 — AB
Leukocytes, UA: NEGATIVE
Nitrite: NEGATIVE
Nitrite: NEGATIVE
Protein, ur: >300 — AB
Protein, ur: NEGATIVE
Specific Gravity, Urine: 1.015
Urobilinogen, UA: 0.2
pH: 5.5

## 2011-09-06 LAB — BLOOD GAS, ARTERIAL
Acid-Base Excess: 1.9
Acid-Base Excess: 2.5 — ABNORMAL HIGH
Acid-base deficit: 1
Acid-base deficit: 1.1
Acid-base deficit: 1.8
Acid-base deficit: 4.2 — ABNORMAL HIGH
Bicarbonate: 20.6
Bicarbonate: 22.3
Bicarbonate: 22.9
Bicarbonate: 24.8 — ABNORMAL HIGH
Bicarbonate: 25.7 — ABNORMAL HIGH
Drawn by: 13898
Drawn by: 14519
Drawn by: 24486
Drawn by: 28701
FIO2: 0.3
FIO2: 0.4
FIO2: 0.4
FIO2: 0.4
FIO2: 0.6
FIO2: 40
MECHVT: 550
MECHVT: 550
MECHVT: 550
MECHVT: 620
Mode: POSITIVE
Mode: POSITIVE
O2 Content: 2
O2 Saturation: 95.7
O2 Saturation: 97
O2 Saturation: 97.6
O2 Saturation: 98.3
O2 Saturation: 99.6
PEEP: 0.5
PEEP: 5
PEEP: 5
PEEP: 5
PEEP: 5
Patient temperature: 101.3
Patient temperature: 92
Patient temperature: 97.7
Patient temperature: 98.6
Patient temperature: 98.6
RATE: 12
RATE: 12
RATE: 14
RATE: 14
TCO2: 21.9
TCO2: 23.4
TCO2: 24
TCO2: 26.9
pCO2 arterial: 30.6 — ABNORMAL LOW
pCO2 arterial: 36.9
pCO2 arterial: 37.8
pCO2 arterial: 39
pCO2 arterial: 39.1
pH, Arterial: 7.34 — ABNORMAL LOW
pH, Arterial: 7.382
pH, Arterial: 7.431
pH, Arterial: 7.456 — ABNORMAL HIGH
pH, Arterial: 7.468 — ABNORMAL HIGH
pO2, Arterial: 103 — ABNORMAL HIGH
pO2, Arterial: 173 — ABNORMAL HIGH
pO2, Arterial: 90.8
pO2, Arterial: 94.3
pO2, Arterial: 97.4

## 2011-09-06 LAB — POCT I-STAT, CHEM 8
Glucose, Bld: 134 — ABNORMAL HIGH
HCT: 40
Hemoglobin: 13.6
Potassium: 4
Sodium: 143

## 2011-09-06 LAB — BASIC METABOLIC PANEL
BUN: 14
BUN: 19
BUN: 19
BUN: 7
BUN: 7
BUN: 9
CO2: 21
CO2: 21
CO2: 22
CO2: 23
CO2: 25
Calcium: 7.8 — ABNORMAL LOW
Calcium: 8.1 — ABNORMAL LOW
Calcium: 8.1 — ABNORMAL LOW
Calcium: 8.8
Chloride: 106
Chloride: 108
Chloride: 112
Creatinine, Ser: 0.68
Creatinine, Ser: 0.7
Creatinine, Ser: 0.74
Creatinine, Ser: 0.91
Creatinine, Ser: 0.93
GFR calc Af Amer: >60
GFR calc Af Amer: >60
GFR calc Af Amer: >60
GFR calc Af Amer: >60
GFR calc Af Amer: >60
GFR calc Af Amer: >60
GFR calc non Af Amer: >60
GFR calc non Af Amer: >60
GFR calc non Af Amer: >60
GFR calc non Af Amer: >60
GFR calc non Af Amer: >60
GFR calc non Af Amer: >60
Glucose, Bld: 103 — ABNORMAL HIGH
Glucose, Bld: 104 — ABNORMAL HIGH
Glucose, Bld: 116 — ABNORMAL HIGH
Glucose, Bld: 92
Potassium: 3.3 — ABNORMAL LOW
Potassium: 3.4 — ABNORMAL LOW
Potassium: 3.5
Potassium: 3.8
Potassium: 4.1
Potassium: 4.1
Sodium: 139
Sodium: 141
Sodium: 142
Sodium: 143
Sodium: 143

## 2011-09-06 LAB — BASIC METABOLIC PANEL WITH GFR
BUN: 10
BUN: 11
BUN: 12
BUN: 9
BUN: 9
BUN: 9
CO2: 21
CO2: 21
CO2: 21
CO2: 22
CO2: 23
CO2: 23
Calcium: 7.2 — ABNORMAL LOW
Calcium: 7.2 — ABNORMAL LOW
Calcium: 7.4 — ABNORMAL LOW
Calcium: 7.5 — ABNORMAL LOW
Calcium: 7.8 — ABNORMAL LOW
Calcium: 7.9 — ABNORMAL LOW
Chloride: 107
Chloride: 109
Chloride: 110
Chloride: 111
Chloride: 115 — ABNORMAL HIGH
Chloride: 116 — ABNORMAL HIGH
Creatinine, Ser: 0.52
Creatinine, Ser: 0.63
Creatinine, Ser: 0.68
Creatinine, Ser: 0.72
Creatinine, Ser: 0.74
Creatinine, Ser: 0.75
GFR calc non Af Amer: >60
GFR calc non Af Amer: >60
GFR calc non Af Amer: >60
GFR calc non Af Amer: >60
GFR calc non Af Amer: >60
GFR calc non Af Amer: >60
Glucose, Bld: 105 — ABNORMAL HIGH
Glucose, Bld: 121 — ABNORMAL HIGH
Glucose, Bld: 126 — ABNORMAL HIGH
Glucose, Bld: 139 — ABNORMAL HIGH
Glucose, Bld: 96
Glucose, Bld: 98
Potassium: 3.3 — ABNORMAL LOW
Potassium: 3.7
Potassium: 3.9
Potassium: 3.9
Potassium: 4.2
Potassium: 4.4
Sodium: 136
Sodium: 137
Sodium: 137
Sodium: 138
Sodium: 141
Sodium: 142

## 2011-09-06 LAB — URINE CULTURE
Colony Count: NO GROWTH
Culture: NO GROWTH

## 2011-09-06 LAB — HEPARIN LEVEL (UNFRACTIONATED)
Heparin Unfractionated: 0.24 — ABNORMAL LOW
Heparin Unfractionated: 0.25 — ABNORMAL LOW
Heparin Unfractionated: 0.28 — ABNORMAL LOW
Heparin Unfractionated: 0.38
Heparin Unfractionated: 0.44
Heparin Unfractionated: 0.52
Heparin Unfractionated: 1.17 — ABNORMAL HIGH
Heparin Unfractionated: <0.1 — ABNORMAL LOW

## 2011-09-06 LAB — CBC
HCT: 35.1 — ABNORMAL LOW
HCT: 35.8 — ABNORMAL LOW
HCT: 36.1 — ABNORMAL LOW
HCT: 38.1 — ABNORMAL LOW
HCT: 39.5
HCT: 39.8
HCT: 41.9
HCT: 43.9
Hemoglobin: 10.7 — ABNORMAL LOW
Hemoglobin: 12.8 — ABNORMAL LOW
Hemoglobin: 13.1
Hemoglobin: 13.1
Hemoglobin: 14.5
MCHC: 33
MCHC: 33.7
MCHC: 33.8
MCHC: 34.2
MCHC: 34.3
MCV: 85
MCV: 85.1
MCV: 85.4
MCV: 85.6
Platelets: 105 — ABNORMAL LOW
Platelets: 122 — ABNORMAL LOW
Platelets: 135 — ABNORMAL LOW
Platelets: 158
Platelets: 169
Platelets: 193
Platelets: 96 — ABNORMAL LOW
RBC: 3.71 — ABNORMAL LOW
RBC: 4.11 — ABNORMAL LOW
RBC: 4.19 — ABNORMAL LOW
RBC: 4.22
RBC: 4.46
RBC: 4.59
RBC: 4.6
RDW: 12.7
RDW: 13.3
RDW: 13.4
RDW: 13.5
WBC: 5.1
WBC: 5.9
WBC: 6.2
WBC: 6.6
WBC: 7.6
WBC: 7.9
WBC: 8.8
WBC: 9

## 2011-09-06 LAB — URINALYSIS, DIPSTICK ONLY
Bilirubin Urine: NEGATIVE
Glucose, UA: NEGATIVE
Ketones, ur: NEGATIVE
Nitrite: NEGATIVE
Protein, ur: NEGATIVE
Specific Gravity, Urine: 1.015
Urobilinogen, UA: 0.2
pH: 5.5

## 2011-09-06 LAB — PROTIME-INR
INR: 1.1
INR: 1.1
INR: 1.1
Prothrombin Time: 13.8
Prothrombin Time: 13.9
Prothrombin Time: 14.4

## 2011-09-06 LAB — CULTURE, RESPIRATORY W GRAM STAIN

## 2011-09-06 LAB — COMPREHENSIVE METABOLIC PANEL WITH GFR
ALT: 103 — ABNORMAL HIGH
ALT: 248 — ABNORMAL HIGH
AST: 267 — ABNORMAL HIGH
AST: 61 — ABNORMAL HIGH
Albumin: 2.4 — ABNORMAL LOW
Albumin: 3.4 — ABNORMAL LOW
Alkaline Phosphatase: 52
Alkaline Phosphatase: 67
BUN: 16
BUN: 8
CO2: 23
CO2: 23
Calcium: 7.4 — ABNORMAL LOW
Calcium: 8.9
Chloride: 103
Chloride: 112
Creatinine, Ser: 0.72
Creatinine, Ser: 0.91
GFR calc non Af Amer: >60
GFR calc non Af Amer: >60
Glucose, Bld: 115 — ABNORMAL HIGH
Glucose, Bld: 146 — ABNORMAL HIGH
Potassium: 2.8 — ABNORMAL LOW
Potassium: 3.8
Sodium: 138
Sodium: 141
Total Bilirubin: 0.5
Total Bilirubin: 0.7
Total Protein: 5 — ABNORMAL LOW
Total Protein: 6.6

## 2011-09-06 LAB — HEPATIC FUNCTION PANEL
ALT: 158 — ABNORMAL HIGH
ALT: 250 — ABNORMAL HIGH
AST: 266 — ABNORMAL HIGH
Albumin: 3.4 — ABNORMAL LOW
Alkaline Phosphatase: 63
Alkaline Phosphatase: 66
Bilirubin, Direct: 0.2
Indirect Bilirubin: 0.2 — ABNORMAL LOW
Indirect Bilirubin: 0.6
Total Bilirubin: 0.3
Total Bilirubin: 0.8
Total Protein: 5.7 — ABNORMAL LOW
Total Protein: 6.5

## 2011-09-06 LAB — CARDIAC PANEL(CRET KIN+CKTOT+MB+TROPI)
CK, MB: 10.2 — ABNORMAL HIGH
CK, MB: 12.5 — ABNORMAL HIGH
CK, MB: 13.4 — ABNORMAL HIGH
Relative Index: 2.7 — ABNORMAL HIGH
Relative Index: 3.2 — ABNORMAL HIGH
Total CK: 381 — ABNORMAL HIGH
Total CK: 425 — ABNORMAL HIGH
Troponin I: 0.3 — ABNORMAL HIGH

## 2011-09-06 LAB — APTT
aPTT: 30
aPTT: 34

## 2011-09-06 LAB — POCT CARDIAC MARKERS
CKMB, poc: 4.3
CKMB, poc: 4.4
Myoglobin, poc: 244
Myoglobin, poc: 247
Operator id: 294341
Troponin i, poc: 0.09 — ABNORMAL HIGH

## 2011-09-06 LAB — CULTURE, BAL-QUANTITATIVE W GRAM STAIN: Colony Count: 5000

## 2011-09-06 LAB — PHOSPHORUS
Phosphorus: 1.6 — ABNORMAL LOW
Phosphorus: 1.7 — ABNORMAL LOW
Phosphorus: 2.1 — ABNORMAL LOW

## 2011-09-06 LAB — DIFFERENTIAL
Eosinophils Relative: 3
Lymphocytes Relative: 43
Lymphs Abs: 2.2
Monocytes Absolute: 0.7

## 2011-09-06 LAB — POCT I-STAT 3, ART BLOOD GAS (G3+)
Bicarbonate: 24.3 — ABNORMAL HIGH
TCO2: 25
pCO2 arterial: 37.2
pH, Arterial: 7.417
pO2, Arterial: 316 — ABNORMAL HIGH

## 2011-09-06 LAB — CULTURE, BLOOD (ROUTINE X 2)

## 2011-09-06 LAB — CALCIUM, IONIZED: Calcium, Ion: 1.19

## 2011-09-06 LAB — MAGNESIUM
Magnesium: 1.8
Magnesium: 2.2

## 2011-09-06 LAB — CORTISOL: Cortisol, Plasma: 16.6

## 2011-09-06 LAB — URINE MICROSCOPIC-ADD ON

## 2011-09-06 NOTE — Progress Notes (Signed)
dictated

## 2011-10-11 ENCOUNTER — Encounter: Payer: Self-pay | Admitting: Cardiology

## 2011-10-11 ENCOUNTER — Ambulatory Visit (INDEPENDENT_AMBULATORY_CARE_PROVIDER_SITE_OTHER): Payer: Medicare Other | Admitting: Cardiology

## 2011-10-11 VITALS — BP 118/70 | HR 61 | Ht 68.5 in | Wt 176.8 lb

## 2011-10-11 DIAGNOSIS — I714 Abdominal aortic aneurysm, without rupture, unspecified: Secondary | ICD-10-CM

## 2011-10-11 DIAGNOSIS — I259 Chronic ischemic heart disease, unspecified: Secondary | ICD-10-CM

## 2011-10-11 DIAGNOSIS — I4949 Other premature depolarization: Secondary | ICD-10-CM

## 2011-10-11 DIAGNOSIS — I2510 Atherosclerotic heart disease of native coronary artery without angina pectoris: Secondary | ICD-10-CM

## 2011-10-11 DIAGNOSIS — I493 Ventricular premature depolarization: Secondary | ICD-10-CM

## 2011-10-11 NOTE — Progress Notes (Signed)
Joel Lin Date of Birth:  Jan 03, 1941 [facility] Card[facility] / [facility] [address]   [address] [place], [place]  [address] [phone]           Fax   [phone]  History of Present Illness: This 70 year old, married, Caucasian gentleman, is seen for a scheduled followup office visit.  He has a past history of known ischemic heart disease.  He had a remote posterolateral myocardial infarction in 1981.  He had angioplasty and stent of his LAD in 1999.  In 2009.  He had an out of hospital cardiac arrest at home and was successfully resuscitated and now has a defibrillator implanted.  He has a history of hypercholesterol, but is intolerant of statins.  His last nuclear stress test was 04/19/10.  Current Outpatient Prescriptions  Medication Sig Dispense Refill  . aspirin 81 MG tablet Take 81 mg by mouth daily.        Marland Kitchen atenolol (TENORMIN) 25 MG tablet Take 12.5 mg by mouth daily.        . Cholecalciferol (VITAMIN D-3 PO) Take 300 mg by mouth 2 (two) times daily.        . colesevelam (WELCHOL) 625 MG tablet Take 3 tablets (1,875 mg total) by mouth 2 (two) times daily with a meal.  540 tablet  3  . IBUPROFEN PO Take 1-2 tablets by mouth as needed.        . multivitamin (THERAGRAN) per tablet Take 1 tablet by mouth daily.        . niacin (NIASPAN) 1000 MG CR tablet Take 1,000 mg by mouth at bedtime.       . nitroGLYCERIN (NITRODUR - DOSED IN MG/24 HR) 0.2 mg/hr Place 1 patch onto the skin daily.        . nitroGLYCERIN (NITROSTAT) 0.4 MG SL tablet Place 0.4 mg under the tongue every 5 (five) minutes as needed.        . Omega-3 Fatty Acids (FISH OIL PO) Take 1 capsule by mouth 2 (two) times daily.        . niacin (NIASPAN) 500 MG CR tablet Take 500 mg by mouth at bedtime.          Allergies  Allergen Reactions  . Clopidogrel Bisulfate   . Iodine     Patient Active Problem List  Diagnoses  . HYPERLIPIDEMIA, MIXED  . CORONARY ATHEROSCLEROSIS  . VENTRICULAR FIBRILLATION  .  PVC's (premature ventricular contractions)  . Abdominal aortic aneurysm    History  Smoking status  . Former Smoker -- 1.0 packs/day for 20 years  . Types: Cigarettes  . Quit date: 12/23/1979  Smokeless tobacco  . Never Used    History  Alcohol Use No    Family History  Problem Relation Age of Onset  . Coronary artery disease      strong family history of     Review of Systems: Constitutional: no fever chills diaphoresis or fatigue or change in weight.  Head and neck: no hearing loss, no epistaxis, no photophobia or visual disturbance. Respiratory: No cough, shortness of breath or wheezing. Cardiovascular: No chest pain peripheral edema, palpitations. Gastrointestinal: No abdominal distention, no abdominal pain, no change in bowel habits hematochezia or melena. Genitourinary: No dysuria, no frequency, no urgency, no nocturia. Musculoskeletal:No arthralgias, no back pain, no gait disturbance or myalgias. Neurological: No dizziness, no headaches, no numbness, no seizures, no syncope, no weakness, no tremors. Hematologic: No lymphadenopathy, no easy bruising. Psychiatric: No confusion, no hallucinations, no  sleep disturbance.    Physical Exam: Filed Vitals:   10/11/11 1535  BP: 118/70  Pulse: 61   general appearance reveals a well-developed, well-nourished, gentleman in no distress.Pupils equal and reactive.   Extraocular Movements are full.  There is no scleral icterus.  The mouth and pharynx are normal.  The neck is supple.  The carotids reveal no bruits.  The jugular venous pressure is normal.  The thyroid is not enlarged.  There is no lymphadenopathy.  The chest is clear to percussion and auscultation. There are no rales or rhonchi. Expansion of the chest is symmetrical.  The precordium is quiet.  The first heart sound is normal.  The second heart sound is physiologically split.  There is no murmur gallop rub or click.  There is no abnormal lift or heave.  The abdomen  is soft and nontender. Bowel sounds are normal. The liver and spleen are not enlarged. There Are no abdominal masses. There are no bruits.  The pedal pulses are good.  There is no phlebitis or edema.  There is no cyanosis or clubbing. Strength is normal and symmetrical in all extremities.  There is no lateralizing weakness.  There are no sensory deficits.   EKG shows sinus bradycardia and left axis deviation, and a pattern of an old inferolateral myocardial infarction   Assessment / Plan:  Continue on same medication.  He will return soon for a lexiscan myoview stress test.

## 2011-10-11 NOTE — Patient Instructions (Signed)
Your physician has requested that you have a lexiscan myoview. For further information please visit [email or web address] Please follow instruction sheet, as given. Will call you with results  Your physician recommends that you schedule a follow-up appointment in: 4 months  Your physician recommends that you continue on your current medications as directed. Please refer to the Current Medication list given to you today.

## 2011-10-11 NOTE — Assessment & Plan Note (Signed)
The patient has not been experiencing any chest pain.  He has been less physically active and has noticed some decrease in stamina.  His weight is up 10 pounds since his last visit

## 2011-10-11 NOTE — Assessment & Plan Note (Signed)
The patient has had no symptoms referable to his known abdominal aortic aneurysm.  This is being followed by Dr. Arbie Cookey.  Because of his aneurysm.  We will use Lexa scan for his upcoming stress

## 2011-10-11 NOTE — Assessment & Plan Note (Signed)
Patient has a past history of PVCs.  He has a past history of ventricular fibrillation, cardiac arrest.  He is presently tolerating low-dose atenolol 12.5 mg daily.  He was unable to tolerate a higher dose.  He has not been aware of any palpitations.  He has had no shocks from his defibrillator.

## 2011-10-24 ENCOUNTER — Ambulatory Visit (HOSPITAL_COMMUNITY): Payer: Medicare Other | Attending: Cardiology | Admitting: Radiology

## 2011-10-24 VITALS — Ht 69.0 in | Wt 171.0 lb

## 2011-10-24 DIAGNOSIS — Z8249 Family history of ischemic heart disease and other diseases of the circulatory system: Secondary | ICD-10-CM | POA: Insufficient documentation

## 2011-10-24 DIAGNOSIS — I739 Peripheral vascular disease, unspecified: Secondary | ICD-10-CM | POA: Insufficient documentation

## 2011-10-24 DIAGNOSIS — R0609 Other forms of dyspnea: Secondary | ICD-10-CM | POA: Insufficient documentation

## 2011-10-24 DIAGNOSIS — Z87891 Personal history of nicotine dependence: Secondary | ICD-10-CM | POA: Insufficient documentation

## 2011-10-24 DIAGNOSIS — R0989 Other specified symptoms and signs involving the circulatory and respiratory systems: Secondary | ICD-10-CM | POA: Insufficient documentation

## 2011-10-24 DIAGNOSIS — E785 Hyperlipidemia, unspecified: Secondary | ICD-10-CM | POA: Insufficient documentation

## 2011-10-24 DIAGNOSIS — I259 Chronic ischemic heart disease, unspecified: Secondary | ICD-10-CM

## 2011-10-24 DIAGNOSIS — I2510 Atherosclerotic heart disease of native coronary artery without angina pectoris: Secondary | ICD-10-CM

## 2011-10-24 MED ORDER — REGADENOSON 0.4 MG/5ML IV SOLN
0.4000 mg | Freq: Once | INTRAVENOUS | Status: AC
  Administered 2011-10-24: 0.4 mg via INTRAVENOUS

## 2011-10-24 MED ORDER — TECHNETIUM TC 99M TETROFOSMIN IV KIT
10.0000 | PACK | Freq: Once | INTRAVENOUS | Status: AC | PRN
  Administered 2011-10-24: 10 via INTRAVENOUS

## 2011-10-24 MED ORDER — TECHNETIUM TC 99M TETROFOSMIN IV KIT
33.0000 | PACK | Freq: Once | INTRAVENOUS | Status: AC | PRN
  Administered 2011-10-24: 33 via INTRAVENOUS

## 2011-10-24 NOTE — Progress Notes (Signed)
[facility] SITE 3 NUCLEAR MED [address] [place] [address] [phone]  Cardiology Nuclear Med Study  Joel Lin is a 70 y.o. male [id number] Aug 22, 1941   Nuclear Med Background Indication for Stress Test:  Evaluation for Ischemia and Stent Patency  History:  '81 PLWMI; '99 Stent-LAD; '09 Cath:Patent Stent, CFX 90%, EF=35%-medical tx; '09 Echo:EF=35-40% (post v-fib arrest); '09 ICD; '11 MPS:Old infero-lateral infarct, no ischemia, EF=53% Cardiac Risk Factors: Family History - CAD, History of Smoking, Lipids and PVD-AAA 4.0 cm  Symptoms:  DOE   Nuclear Pre-Procedure Caffeine/Decaff Intake:  None NPO After: 9:00pm   Lungs:  Clear.  O2 SAT 99% on RA. IV 0.9% NS with Angio Cath:  20g  IV Site: R Antecubital x 1, tolerated well IV Started by:  Irean Hong, RN  Chest Size (in):  42 Cup Size: n/a  Height: 5\' 9"  (1.753 m)  Weight:  171 lb (77.565 kg)  BMI:  Body mass index is 25.25 kg/(m^2). Tech Comments:  Held Atenolol x 24 hours    Nuclear Med Study 1 or 2 day study: 1 day  Stress Test Type:  Lexiscan  Reading MD: Willa Rough, MD  Order Authorizing Provider:  Cassell Clement, MD  Resting Radionuclide: Technetium 88m Tetrofosmin  Resting Radionuclide Dose: 11.0 mCi   Stress Radionuclide:  Technetium 10m Tetrofosmin  Stress Radionuclide Dose: 33.0 mCi           Stress Protocol Rest HR: 58 Stress HR: 108  Rest BP: 137/78 Stress BP: 138/85  Exercise Time (min): n/a METS: n/a   Predicted Max HR: 151 bpm % Max HR: 71.52 bpm Rate Pressure Product: 46962   Dose of Adenosine (mg):  n/a Dose of Lexiscan: 0.4 mg  Dose of Atropine (mg): n/a Dose of Dobutamine: n/a mcg/kg/min (at max HR)  Stress Test Technologist: Smiley Houseman, CMA-N  Nuclear Technologist:  Doyne Keel, CNMT     Rest Procedure:  Myocardial perfusion imaging was performed at rest 45 minutes following the intravenous administration of Technetium 5m Tetrofosmin.  Rest ECG:  Old ILWMI with nonspecific T-wave changes.  Stress Procedure:  The patient received IV Lexiscan 0.4 mg over 15-seconds.  Technetium 84m Tetrofosmin injected at 30-seconds.  There were no significant changes with Lexiscan, only occasional PVC's.  Quantitative spect images were obtained after a 45 minute delay.  Stress ECG: No significant change from baseline ECG  QPS Raw Data Images:  Normal; no motion artifact; normal heart/lung ratio. Stress Images:  Severe decrease in activity in the entire inferolateral wall and lateral wall. Rest Images:  Same as stress. Subtraction (SDS):  No ischemia  Transient Ischemic Dilatation (Normal <1.22):  0.92 Lung/Heart Ratio (Normal <0.45):  0.25  Quantitative Gated Spect Images QGS EDV:  141 ml QGS ESV:  78 ml QGS cine images:  severe hypokinesis of the inferolateral and lateral walls. QGS EF: 45%  Impression Exercise Capacity:  Lexiscan with no exercise. BP Response:  Normal blood pressure response. Clinical Symptoms:  stomach cramp ECG Impression:  No significant ST segment change suggestive of ischemia. Comparison with Prior Nuclear Study: No significant change from previous study  Overall Impression:  Old large inferolateral and lateral scar. No ischemia.  Willa Rough

## 2011-10-26 ENCOUNTER — Telehealth: Payer: Self-pay | Admitting: *Deleted

## 2011-10-26 NOTE — Telephone Encounter (Signed)
Advised of stress test results 

## 2011-10-26 NOTE — Telephone Encounter (Signed)
Message copied by Burnell Blanks on Wed Oct 26, 2011 11:15 AM ------      Message from: Cassell Clement      Created: Tue Oct 25, 2011  5:47 PM       Please report.  The stress test did not show any ischemia.      The old scar is still seen.  The ejection fraction was 45%.  This is slightly lower than last year but still acceptable.  Continue same medication.

## 2011-11-08 ENCOUNTER — Encounter (INDEPENDENT_AMBULATORY_CARE_PROVIDER_SITE_OTHER): Payer: Medicare Other | Admitting: *Deleted

## 2011-11-08 DIAGNOSIS — I714 Abdominal aortic aneurysm, without rupture, unspecified: Secondary | ICD-10-CM

## 2011-11-10 ENCOUNTER — Other Ambulatory Visit: Payer: Self-pay | Admitting: Cardiology

## 2011-11-10 NOTE — Telephone Encounter (Signed)
Refilled atenolol and niaspan

## 2011-11-16 ENCOUNTER — Other Ambulatory Visit: Payer: Self-pay

## 2011-11-16 ENCOUNTER — Encounter: Payer: Self-pay | Admitting: Vascular Surgery

## 2011-11-16 DIAGNOSIS — I714 Abdominal aortic aneurysm, without rupture, unspecified: Secondary | ICD-10-CM

## 2011-11-16 NOTE — Procedures (Unsigned)
DUPLEX ULTRASOUND OF ABDOMINAL AORTA  INDICATION:  Follow up AAA.  HISTORY: Diabetes:  No. Cardiac:  Yes. Hypertension:  No. Smoking:  Previous. Connective Tissue Disorder: Family History:  No. Previous Surgery:  No.  DUPLEX EXAM:         AP (cm)                   TRANSVERSE (cm) Proximal             2.2 cm Mid                  4.29 cm                   4.32 cm Distal               3.01 cm                   2.81 cm Right Iliac          1.00 cm Left Iliac           1.06 cm  PREVIOUS:  Date: 11/09/10  AP:  4.26  TRANSVERSE:  4.29  IMPRESSION: 1. Stable abdominal aortic aneurysm measuring approximately 4.3 cm on     today's examination. 2. Bilateral common iliac arteries appear within normal limits.  ___________________________________________ Larina Earthly, M.D.  LT/MEDQ  D:  11/08/2011  T:  11/08/2011  Job:  [id number]

## 2011-12-01 ENCOUNTER — Ambulatory Visit (INDEPENDENT_AMBULATORY_CARE_PROVIDER_SITE_OTHER): Payer: Medicare Other | Admitting: *Deleted

## 2011-12-01 DIAGNOSIS — I4901 Ventricular fibrillation: Secondary | ICD-10-CM

## 2011-12-03 ENCOUNTER — Other Ambulatory Visit: Payer: Self-pay | Admitting: Internal Medicine

## 2011-12-03 ENCOUNTER — Encounter: Payer: Self-pay | Admitting: Internal Medicine

## 2011-12-03 LAB — REMOTE ICD DEVICE
BRDY-0002RV: 40 {beats}/min
DEVICE MODEL ICD: 549899
RV LEAD AMPLITUDE: 9.1 mv
TZAT-0001FASTVT: 1
TZAT-0001SLOWVT: 1
TZAT-0004SLOWVT: 8
TZAT-0019SLOWVT: 7.5 V
TZAT-0020SLOWVT: 1 ms
TZON-0004SLOWVT: 35
TZON-0010FASTVT: 80 ms
TZON-0010SLOWVT: 80 ms
TZST-0001FASTVT: 2
TZST-0001SLOWVT: 2
TZST-0001SLOWVT: 3
TZST-0001SLOWVT: 4
TZST-0003FASTVT: 36 J
TZST-0003FASTVT: 36 J
TZST-0003SLOWVT: 20 J
TZST-0003SLOWVT: 36 J
VENTRICULAR PACING ICD: 1 pct

## 2011-12-07 NOTE — Progress Notes (Signed)
ICD remote 

## 2011-12-29 ENCOUNTER — Encounter: Payer: Self-pay | Admitting: *Deleted

## 2012-01-31 ENCOUNTER — Telehealth: Payer: Self-pay | Admitting: Cardiology

## 2012-01-31 NOTE — Telephone Encounter (Signed)
Spoke with wife and will just get labs day of appointment

## 2012-01-31 NOTE — Telephone Encounter (Signed)
Pt wants to know if blood work needed at appt next Tuesday, 2-26? If so can get order, and call pt to let him know?

## 2012-02-07 ENCOUNTER — Encounter: Payer: Self-pay | Admitting: Cardiology

## 2012-02-07 ENCOUNTER — Ambulatory Visit (INDEPENDENT_AMBULATORY_CARE_PROVIDER_SITE_OTHER): Payer: Medicare Other | Admitting: Cardiology

## 2012-02-07 VITALS — BP 138/78 | HR 78 | Ht 69.0 in | Wt 177.0 lb

## 2012-02-07 DIAGNOSIS — I714 Abdominal aortic aneurysm, without rupture, unspecified: Secondary | ICD-10-CM

## 2012-02-07 DIAGNOSIS — I2510 Atherosclerotic heart disease of native coronary artery without angina pectoris: Secondary | ICD-10-CM

## 2012-02-07 DIAGNOSIS — E785 Hyperlipidemia, unspecified: Secondary | ICD-10-CM

## 2012-02-07 DIAGNOSIS — E782 Mixed hyperlipidemia: Secondary | ICD-10-CM

## 2012-02-07 DIAGNOSIS — I4901 Ventricular fibrillation: Secondary | ICD-10-CM

## 2012-02-07 LAB — LIPID PANEL
Cholesterol: 182 mg/dL (ref 0–200)
HDL: 43.3 mg/dL (ref >39.00)
LDL Cholesterol: 121 mg/dL — ABNORMAL HIGH (ref 0–99)
Triglycerides: 90 mg/dL (ref 0.0–149.0)
VLDL: 18 mg/dL (ref 0.0–40.0)

## 2012-02-07 LAB — HEPATIC FUNCTION PANEL
ALT: 28 U/L (ref 0–53)
Albumin: 4.1 g/dL (ref 3.5–5.2)
Total Protein: 7.6 g/dL (ref 6.0–8.3)

## 2012-02-07 LAB — BASIC METABOLIC PANEL
Chloride: 106 mEq/L (ref 96–112)
GFR: 86.54 mL/min (ref >60.00)
Potassium: 4 mEq/L (ref 3.5–5.1)

## 2012-02-07 NOTE — Assessment & Plan Note (Signed)
The patient has not been experiencing any chest pain or angina pectoris.  He has been less physically active over the winter.  His weight is up 1 pound.

## 2012-02-07 NOTE — Patient Instructions (Addendum)
Will obtain labs today and call you with the results (lp/bmet/hfp)  Work harder on diet and weight loss  Your physician recommends that you continue on your current medications as directed. Please refer to the Current Medication list given to you today.  Your physician recommends that you schedule a follow-up appointment in: 4 months with fasting labs (lp/bmet/hfp) and EKG

## 2012-02-07 NOTE — Assessment & Plan Note (Signed)
The patient has not had any symptoms from his abdominal aortic aneurysm which has remained stable in size and is followed annually by the vascular surgery service

## 2012-02-07 NOTE — Assessment & Plan Note (Signed)
The patient has a history of dyslipidemia.  He is intolerant of statins.  He is on niacin and WelChol

## 2012-02-07 NOTE — Progress Notes (Signed)
Joel Lin Date of Birth:  Jul 31, 1941 [facility] [address] [place], [place]  [address] [phone]         Fax   [phone]  History of Present Illness: This pleasant 71 year old gentleman is seen for a scheduled four-month followup office visit.  He has a history of ischemic heart disease.  He had a large posterolateral myocardial infarction in 1981.  He had angioplasty and stent of his LAD in 1999.  In 2009 he had an out of hospital cardiac arrest at home and was successfully resuscitated.  He now has a defibrillator in place.  The patient has a history of hypercholesterolemia.  His last nuclear stress test was on 10/24/11 and showed an ejection fraction of 45% and a large posterolateral scar but no reversible ischemia.  Current Outpatient Prescriptions  Medication Sig Dispense Refill  . aspirin 81 MG tablet Take 81 mg by mouth daily.        Marland Kitchen atenolol (TENORMIN) 25 MG tablet Take 0.5 tablets (12.5 mg total) by mouth daily.  90 tablet  3  . Cholecalciferol (VITAMIN D-3 PO) Take 300 mg by mouth 2 (two) times daily.        . colesevelam (WELCHOL) 625 MG tablet Take 3 tablets (1,875 mg total) by mouth 2 (two) times daily with a meal.  540 tablet  3  . IBUPROFEN PO Take 1-2 tablets by mouth as needed.        . loratadine (CLARITIN) 10 MG tablet Take 10 mg by mouth daily.      . multivitamin (THERAGRAN) per tablet Take 1 tablet by mouth daily.        . niacin (NIASPAN) 500 MG CR tablet Take 500 mg by mouth at bedtime.        . nitroGLYCERIN (NITRODUR - DOSED IN MG/24 HR) 0.2 mg/hr Place 1 patch onto the skin daily.        . nitroGLYCERIN (NITROSTAT) 0.4 MG SL tablet Place 0.4 mg under the tongue every 5 (five) minutes as needed.        . Omega-3 Fatty Acids (FISH OIL PO) Take 1 capsule by mouth 2 (two) times daily.          Allergies  Allergen Reactions  . Clopidogrel Bisulfate   . Iodine     Patient Active Problem List  Diagnoses  .  HYPERLIPIDEMIA, MIXED  . CORONARY ATHEROSCLEROSIS  . VENTRICULAR FIBRILLATION  . PVC's (premature ventricular contractions)  . Abdominal aortic aneurysm    History  Smoking status  . Former Smoker -- 1.0 packs/day for 20 years  . Types: Cigarettes  . Quit date: 12/23/1979  Smokeless tobacco  . Never Used    History  Alcohol Use No    Family History  Problem Relation Age of Onset  . Coronary artery disease      strong family history of     Review of Systems: Constitutional: no fever chills diaphoresis or fatigue or change in weight.  Head and neck: no hearing loss, no epistaxis, no photophobia or visual disturbance. Respiratory: No cough, shortness of breath or wheezing. Cardiovascular: No chest pain peripheral edema, palpitations. Gastrointestinal: No abdominal distention, no abdominal pain, no change in bowel habits hematochezia or melena. Genitourinary: No dysuria, no frequency, no urgency, no nocturia. Musculoskeletal:No arthralgias, no back pain, no gait disturbance or myalgias. Neurological: No dizziness, no headaches, no numbness, no seizures, no syncope, no weakness, no tremors. Hematologic: No lymphadenopathy, no easy bruising. Psychiatric: No  confusion, no hallucinations, no sleep disturbance.    Physical Exam: Filed Vitals:   02/07/12 0846  BP: 138/78  Pulse: 78   the general appearance reveals a well-developed well-nourished gentleman in no distress.Pupils equal and reactive.   Extraocular Movements are full.  There is no scleral icterus.  The mouth and pharynx are normal.  The neck is supple.  The carotids reveal no bruits.  The jugular venous pressure is normal.  The thyroid is not enlarged.  There is no lymphadenopathy.  The chest is clear to percussion and auscultation. There are no rales or rhonchi. Expansion of the chest is symmetrical.  The precordium is quiet.  The first heart sound is normal.  The second heart sound is physiologically split.  There  is no murmur gallop rub or click.  There is no abnormal lift or heave.  The abdomen is soft and nontender. Bowel sounds are normal. The liver and spleen are not enlarged. There Are no abdominal masses. There are no bruits.  The pedal pulses are good.  There is no phlebitis or edema.  There is no cyanosis or clubbing. Strength is normal and symmetrical in all extremities.  There is no lateralizing weakness.  There are no sensory deficits.     Assessment / Plan: The patient is to continue same medication.  Work harder on exercise and weight loss.  Today he will be driving down to [place] today with his daughter tomorrow who is having a pericardial cyst removed from her heart at [facility] in [place]

## 2012-02-07 NOTE — Assessment & Plan Note (Signed)
The patient has not had any shocks from his defibrillator. 

## 2012-02-08 ENCOUNTER — Telehealth: Payer: Self-pay | Admitting: *Deleted

## 2012-02-08 NOTE — Telephone Encounter (Signed)
Message copied by Burnell Blanks on Wed Feb 08, 2012  9:48 AM ------      Message from: Cassell Clement      Created: Wed Feb 08, 2012  6:04 AM       Please report to patient.  The recent labs are stable. Continue same medication and careful diet. LDL 121 too high so work harder on exercise and diet.

## 2012-02-08 NOTE — Progress Notes (Signed)
Quick Note:  Please report to patient. The recent labs are stable. Continue same medication and careful diet. LDL 121 too high so work harder on exercise and diet. ______

## 2012-02-08 NOTE — Telephone Encounter (Signed)
Daughter having surgery today, advised would mail copy and to call if any questions

## 2012-03-08 ENCOUNTER — Ambulatory Visit (INDEPENDENT_AMBULATORY_CARE_PROVIDER_SITE_OTHER): Payer: Medicare Other | Admitting: *Deleted

## 2012-03-08 DIAGNOSIS — I4901 Ventricular fibrillation: Secondary | ICD-10-CM

## 2012-03-08 LAB — REMOTE ICD DEVICE
BATTERY VOLTAGE: 3.02 V
DEV-0020ICD: NEGATIVE
DEVICE MODEL ICD: 549899
RV LEAD AMPLITUDE: 9.2 mv
TZAT-0001FASTVT: 1
TZAT-0001SLOWVT: 1
TZAT-0004FASTVT: 8
TZAT-0012SLOWVT: 200 ms
TZAT-0018FASTVT: NEGATIVE
TZAT-0019FASTVT: 7.5 V
TZAT-0019SLOWVT: 7.5 V
TZAT-0020FASTVT: 1 ms
TZAT-0020SLOWVT: 1 ms
TZON-0003FASTVT: 280 ms
TZON-0004FASTVT: 24
TZON-0004SLOWVT: 35
TZON-0005SLOWVT: 6
TZON-0010FASTVT: 80 ms
TZON-0010SLOWVT: 80 ms
TZST-0001FASTVT: 2
TZST-0001FASTVT: 3
TZST-0001FASTVT: 4
TZST-0001FASTVT: 5
TZST-0001SLOWVT: 2
TZST-0001SLOWVT: 4
TZST-0003FASTVT: 30 J
TZST-0003FASTVT: 36 J
TZST-0003SLOWVT: 20 J
TZST-0003SLOWVT: 36 J
VENTRICULAR PACING ICD: 1 pct

## 2012-03-14 NOTE — Progress Notes (Signed)
Remote icd check  

## 2012-03-19 ENCOUNTER — Telehealth: Payer: Self-pay | Admitting: Internal Medicine

## 2012-03-19 NOTE — Telephone Encounter (Signed)
Close  

## 2012-03-22 ENCOUNTER — Ambulatory Visit (INDEPENDENT_AMBULATORY_CARE_PROVIDER_SITE_OTHER): Payer: Medicare Other | Admitting: Internal Medicine

## 2012-03-22 ENCOUNTER — Encounter: Payer: Self-pay | Admitting: Internal Medicine

## 2012-03-22 VITALS — BP 124/79 | HR 75 | Ht 70.0 in | Wt 179.0 lb

## 2012-03-22 DIAGNOSIS — I469 Cardiac arrest, cause unspecified: Secondary | ICD-10-CM

## 2012-03-22 DIAGNOSIS — Z95810 Presence of automatic (implantable) cardiac defibrillator: Secondary | ICD-10-CM

## 2012-03-22 DIAGNOSIS — I2510 Atherosclerotic heart disease of native coronary artery without angina pectoris: Secondary | ICD-10-CM

## 2012-03-22 DIAGNOSIS — I472 Ventricular tachycardia: Secondary | ICD-10-CM

## 2012-03-22 DIAGNOSIS — I4729 Other ventricular tachycardia: Secondary | ICD-10-CM

## 2012-03-22 LAB — ICD DEVICE OBSERVATION
DEV-0020ICD: NEGATIVE
DEVICE MODEL ICD: 549899
TZAT-0001FASTVT: 1
TZAT-0001SLOWVT: 1
TZAT-0013SLOWVT: 3
TZAT-0019FASTVT: 7.5 V
TZAT-0020FASTVT: 1 ms
TZAT-0020SLOWVT: 1 ms
TZON-0004FASTVT: 24
TZON-0005FASTVT: 6
TZON-0005SLOWVT: 6
TZON-0010FASTVT: 80 ms
TZON-0010SLOWVT: 80 ms
TZST-0001FASTVT: 4
TZST-0001FASTVT: 5
TZST-0001SLOWVT: 2
TZST-0001SLOWVT: 4
TZST-0003FASTVT: 30 J
TZST-0003FASTVT: 36 J
TZST-0003SLOWVT: 30 J

## 2012-03-22 NOTE — Assessment & Plan Note (Signed)
Had 2 episodes of ventricular tachycardia in January treated with antitachycardia pacing. His occurred outside the context of any clearly identifiable change in clinical situation. He has had none since. We will continue to observe without change in medication

## 2012-03-22 NOTE — Patient Instructions (Addendum)
Remote monitoring is used to monitor your Pacemaker of ICD from home. This monitoring reduces the number of office visits required to check your device to one time per year. It allows [place] to keep an eye on the functioning of your device to ensure it is working properly. You are scheduled for a device check from home on 06/07/12. You may send your transmission at any time that day. If you have a wireless device, the transmission will be sent automatically. After your physician reviews your transmission, you will receive a postcard with your next transmission date.  Your physician wants you to follow-up in: September 2013 with Dr. Graciela Husbands. You will receive a reminder letter in the mail two months in advance. If you don't receive a letter, please call our office to schedule the follow-up appointment.  Your physician has recommended you make the following change in your medication:  1) Increase atenolol to 25 mg 1/2 tablet by mouth twice daily.

## 2012-03-22 NOTE — Assessment & Plan Note (Signed)
1 no change angina. Functional status is stable. Myoview was reviewed from November 2012 is scar but no ischemia his

## 2012-03-22 NOTE — Assessment & Plan Note (Signed)
The patient's device was interrogated.  The information was reviewed. No changes were made in the programming.    

## 2012-03-22 NOTE — Progress Notes (Signed)
HPI  Joel Lin is a 71 y.o. male is seen in followup for aborted sudden cardiac death in the setting of ischemic heart disease prior PCI and mild depression of LV systolic function. he is status post ICD implantation.  There has been an intercurrent Myoview scan in May; ejection fraction had improved to 53%; previous IMI evident.  There have been no intercurrent discharges. He denies chest pain or shortness of breath.  He has a history of prior inappropriate ICD discharges secondary to sinus tachycardia  he's seen today because of a recent report demonstrating 2 episodes of  ventricular tachycardia; these both  occurred in January he has no recollection of any change in his symptoms at that time    Past Medical History  Diagnosis Date  . Myocardial infarction     at a very young age 71  . Coronary artery disease   . Dyslipidemia   . Cardiac arrest 03/29/2008    at home without resuscitation anywhere from 7-10 minutes before EMS arrived/ The patient was noted to be in ventricular fibrillation  required defibrillation x3               . Hypercoagulable state     Questionable history of a borderline hypercoagulable state  . Coronary atherosclerosis   . Hyperlipidemia   . Anoxic brain damage      resolved  . Respiratory failure     Respiratory failure and pulmonary collapse requiring intubation  . Ventricular fibrillation 08/10/2009  . History of cardiovascular stress test 04/19/2010    EF of 53% / large area of old inferolateral infarct but no reversible ischemia          . PVCs (premature ventricular contractions)     occasionally    Past Surgical History  Procedure Date  . Cholecystectomy, laparoscopic 08/13/2009    Calculous cholecystitis  . Insert / replace / remove pacemaker 04/07/2008      cardioverter defibrillator, [employer] Jude Single-chamber defibrillator implantation with intraoperative defibrillation threshold testing /  Durata 7121 dual-coil active  defibrillator lead, serial  #[id number]   . Cardiac catheterization  04/04/2008     EF is around 35%./ Coronary artery disease primarily involving the LAD in the  circumflex artery.  The LAD stent is widely patent and this vessel appears to be very nice  . Coronary angioplasty with stent placement 1999    PTCA and stenting of his left anterior descending artery    Current Outpatient Prescriptions  Medication Sig Dispense Refill  . aspirin 81 MG tablet Take 81 mg by mouth daily.        Marland Kitchen atenolol (TENORMIN) 25 MG tablet Take 0.5 tablets (12.5 mg total) by mouth daily.  90 tablet  3  . Cholecalciferol (VITAMIN D-3 PO) Take 300 mg by mouth 2 (two) times daily.        . colesevelam (WELCHOL) 625 MG tablet Take 3 tablets (1,875 mg total) by mouth 2 (two) times daily with a meal.  540 tablet  3  . IBUPROFEN PO Take 1-2 tablets by mouth as needed.        . loratadine (CLARITIN) 10 MG tablet Take 10 mg by mouth daily.      . multivitamin (THERAGRAN) per tablet Take 1 tablet by mouth daily.        . niacin (NIASPAN) 500 MG CR tablet Take 1,000 mg by mouth at bedtime.       . nitroGLYCERIN (NITRODUR - DOSED IN MG/24 HR)  0.2 mg/hr Place 1 patch onto the skin daily.        . nitroGLYCERIN (NITROSTAT) 0.4 MG SL tablet Place 0.4 mg under the tongue every 5 (five) minutes as needed.        . Omega-3 Fatty Acids (FISH OIL PO) Take 1 capsule by mouth 2 (two) times daily.          Allergies  Allergen Reactions  . Clopidogrel Bisulfate   . Iodine     Review of Systems negative except from HPI and PMH  Physical Exam BP 124/79  Pulse 75  Ht 5\' 10"  (1.778 m)  Wt 179 lb (81.194 kg)  BMI 25.68 kg/m2 Well developed and well nourished in no acute distress HENT normal E scleral and icterus clear Neck Supple JVP flat; carotids brisk and full Clear to ausculation Regular rate and rhythm, no murmurs gallops or rub Soft with active bowel sounds No clubbing cyanosis none Edema Alert and oriented, grossly  normal motor and sensory function Skin Warm and Dry   Assessment and  Plan

## 2012-03-23 ENCOUNTER — Encounter: Payer: Self-pay | Admitting: Internal Medicine

## 2012-03-23 ENCOUNTER — Encounter: Payer: Medicare Other | Admitting: Internal Medicine

## 2012-05-28 ENCOUNTER — Other Ambulatory Visit (INDEPENDENT_AMBULATORY_CARE_PROVIDER_SITE_OTHER): Payer: Medicare Other

## 2012-05-28 ENCOUNTER — Ambulatory Visit (INDEPENDENT_AMBULATORY_CARE_PROVIDER_SITE_OTHER): Payer: Medicare Other | Admitting: Cardiology

## 2012-05-28 ENCOUNTER — Encounter: Payer: Self-pay | Admitting: Cardiology

## 2012-05-28 VITALS — BP 118/74 | HR 60 | Ht 66.0 in | Wt 178.0 lb

## 2012-05-28 DIAGNOSIS — I4729 Other ventricular tachycardia: Secondary | ICD-10-CM

## 2012-05-28 DIAGNOSIS — R0989 Other specified symptoms and signs involving the circulatory and respiratory systems: Secondary | ICD-10-CM

## 2012-05-28 DIAGNOSIS — I2510 Atherosclerotic heart disease of native coronary artery without angina pectoris: Secondary | ICD-10-CM

## 2012-05-28 DIAGNOSIS — E785 Hyperlipidemia, unspecified: Secondary | ICD-10-CM

## 2012-05-28 DIAGNOSIS — I472 Ventricular tachycardia: Secondary | ICD-10-CM

## 2012-05-28 DIAGNOSIS — E782 Mixed hyperlipidemia: Secondary | ICD-10-CM

## 2012-05-28 DIAGNOSIS — E7800 Pure hypercholesterolemia, unspecified: Secondary | ICD-10-CM

## 2012-05-28 LAB — HEPATIC FUNCTION PANEL
ALT: 28 U/L (ref 0–53)
AST: 35 U/L (ref 0–37)
Alkaline Phosphatase: 56 U/L (ref 39–117)
Bilirubin, Direct: 0 mg/dL (ref 0.0–0.3)
Total Bilirubin: 0.3 mg/dL (ref 0.3–1.2)

## 2012-05-28 LAB — BASIC METABOLIC PANEL
BUN: 20 mg/dL (ref 6–23)
CO2: 24 mEq/L (ref 19–32)
Calcium: 8.9 mg/dL (ref 8.4–10.5)
Creatinine, Ser: 0.8 mg/dL (ref 0.4–1.5)
GFR: 109.45 mL/min (ref >60.00)
Glucose, Bld: 88 mg/dL (ref 70–99)

## 2012-05-28 LAB — LIPID PANEL
HDL: 42.8 mg/dL (ref >39.00)
Total CHOL/HDL Ratio: 3

## 2012-05-28 MED ORDER — COLESEVELAM HCL 625 MG PO TABS: 1875.0000 mg | ORAL_TABLET | Freq: Two times a day (BID) | ORAL | Status: DC

## 2012-05-28 MED ORDER — NITROGLYCERIN 0.4 MG SL SUBL: 0.4000 mg | SUBLINGUAL_TABLET | SUBLINGUAL | Status: DC | PRN

## 2012-05-28 MED ORDER — NITROGLYCERIN 0.2 MG/HR TD PT24: 1.0000 | MEDICATED_PATCH | Freq: Every day | TRANSDERMAL | Status: DC

## 2012-05-28 NOTE — Assessment & Plan Note (Signed)
The patient is now on a larger dose of beta blocker since seeing Dr. Graciela Husbands and since he had the 2 episodes of ventricular tachycardia in January 2013.  He now takes atenolol 12.5 mg twice a day and appears to be tolerating it well

## 2012-05-28 NOTE — Progress Notes (Signed)
Quick Note:  Please report to patient. The recent labs are stable. Continue same medication and careful diet. ______ 

## 2012-05-28 NOTE — Patient Instructions (Signed)
Continue current meds.  STOP USING AFRIN!!  Your physician recommends that you schedule a follow-up appointment in: 4 months with Dr. Patty Sermons.  Your physician recommends that you return for fasting lab work in: 4 months - Lipid, Liver BMET.

## 2012-05-28 NOTE — Assessment & Plan Note (Signed)
Patient has a history of dyslipidemia and is intolerant of statins.  We are checking lab work today.  The patient is on niacin and is on WelChol

## 2012-05-28 NOTE — Progress Notes (Signed)
Joel Lin Date of Birth:  May 30, 1941 [facility] [address] [place], [place]  [address] [phone]         Fax   [phone]  History of Present Illness: This pleasant 71 year old gentleman is seen for a four-month followup office visit.  He has a complex past medical history.  He has a history of known ischemic heart disease.  He has a history of a remote myocardial infarction at the age of 61.  There was a large posterolateral myocardial infarction in 1981.  The patient had angioplasty and stent of his LAD in 1999.  In 2009 he had an out of hospital cardiac arrest at home was successfully resuscitated and treated with hyperthermia without significant neurologic sequelae.  He now has a defibrillator in place and is followed by Dr. Alberteen Spindle.  He has a history of hypercholesterolemia.  His last nuclear stress test was 10/24/11 and showed an ejection fraction of 45% and a large posterolateral scar but no ischemia.  The patient has his defibrillator monitored through Dr. Odessa Fleming office.  In January 2013 he had 2 episodes of ventricular tachycardia which were asymptomatic.  He has had no inappropriate ICD discharges Current Outpatient Prescriptions  Medication Sig Dispense Refill  . aspirin 81 MG tablet Take 81 mg by mouth daily.        Marland Kitchen atenolol (TENORMIN) 25 MG tablet Take 1/2 tablet by mouth twice daily.      . Cholecalciferol (VITAMIN D-3 PO) Take 300 mg by mouth 2 (two) times daily.        . colesevelam (WELCHOL) 625 MG tablet Take 3 tablets (1,875 mg total) by mouth 2 (two) times daily with a meal.  540 tablet  3  . fluticasone (FLONASE) 50 MCG/ACT nasal spray Place 2 sprays into the nose daily. As needed      . GLUCOSAMINE PO Take by mouth. Taking DS daily      . IBUPROFEN PO Take 1-2 tablets by mouth as needed.        . loratadine (CLARITIN) 10 MG tablet Take 10 mg by mouth daily.      . multivitamin (THERAGRAN) per tablet Take 1 tablet by mouth daily.         . niacin (NIASPAN) 500 MG CR tablet Take 1,000 mg by mouth at bedtime.       . nitroGLYCERIN (NITRODUR - DOSED IN MG/24 HR) 0.2 mg/hr Place 1 patch (0.2 mg total) onto the skin daily.  30 patch  3  . nitroGLYCERIN (NITROSTAT) 0.4 MG SL tablet Place 1 tablet (0.4 mg total) under the tongue every 5 (five) minutes as needed.  25 tablet  3  . Omega-3 Fatty Acids (FISH OIL PO) Take 1 capsule by mouth 2 (two) times daily.        Marland Kitchen DISCONTD: colesevelam (WELCHOL) 625 MG tablet Take 3 tablets (1,875 mg total) by mouth 2 (two) times daily with a meal.  540 tablet  3  . DISCONTD: nitroGLYCERIN (NITRODUR - DOSED IN MG/24 HR) 0.2 mg/hr Place 1 patch onto the skin daily.        Marland Kitchen DISCONTD: nitroGLYCERIN (NITROSTAT) 0.4 MG SL tablet Place 0.4 mg under the tongue every 5 (five) minutes as needed.          Allergies  Allergen Reactions  . Clopidogrel Bisulfate   . Iodine     Patient Active Problem List  Diagnosis  . HYPERLIPIDEMIA, MIXED  . CORONARY ATHEROSCLEROSIS  . VENTRICULAR FIBRILLATION  .  PVC's (premature ventricular contractions)  . Abdominal aortic aneurysm  . Ventricular tachycardia, inducible  . ICD (implantable cardiac defibrillator), single, [employer]    History  Smoking status  . Former Smoker -- 1.0 packs/day for 20 years  . Types: Cigarettes  . Quit date: 12/23/1979  Smokeless tobacco  . Never Used    History  Alcohol Use No    Family History  Problem Relation Age of Onset  . Coronary artery disease      strong family history of     Review of Systems: Constitutional: no fever chills diaphoresis or fatigue or change in weight.  Head and neck: no hearing loss, no epistaxis, no photophobia or visual disturbance. Respiratory: No cough, shortness of breath or wheezing. Cardiovascular: No chest pain peripheral edema, palpitations. Gastrointestinal: No abdominal distention, no abdominal pain, no change in bowel habits hematochezia or melena. Genitourinary: No dysuria, no  frequency, no urgency, no nocturia. Musculoskeletal:No arthralgias, no back pain, no gait disturbance or myalgias. Neurological: No dizziness, no headaches, no numbness, no seizures, no syncope, no weakness, no tremors. Hematologic: No lymphadenopathy, no easy bruising. Psychiatric: No confusion, no hallucinations, no sleep disturbance.    Physical Exam: Filed Vitals:   05/28/12 0921  BP: 118/74  Pulse: 60   the general appearance reveals a well-developed well-nourished gentleman in no distress.The head and neck exam reveals pupils equal and reactive.  Extraocular movements are full.  There is no scleral icterus.  The mouth and pharynx are normal.  The neck is supple.  The carotids reveal no bruits.  The jugular venous pressure is normal.  The  thyroid is not enlarged.  There is no lymphadenopathy.  The chest is clear to percussion and auscultation.  There are no rales or rhonchi.  Expansion of the chest is symmetrical.  The precordium is quiet.  The first heart sound is normal.  The second heart sound is physiologically split.  There is no murmur gallop rub or click.  There is no abnormal lift or heave.  The abdomen is soft and nontender.  The bowel sounds are normal.  The liver and spleen are not enlarged.  There are no abdominal masses.  There are no abdominal bruits.  Extremities reveal good pedal pulses.  There is no phlebitis or edema.  There is no cyanosis or clubbing.  Strength is normal and symmetrical in all extremities.  There is no lateralizing weakness.  There are no sensory deficits.  The skin is warm and dry.  There is no rash.     Assessment / Plan: The patient appears to be doing well.  He needs to get more regular exercise and needs to lose weight.  His weight is up 1 pound since last visit.  We are checking labs today.  He'll return in 4 months for followup office visit and lab work.

## 2012-05-28 NOTE — Assessment & Plan Note (Signed)
The patient has not been experiencing any chest pain or angina.  He has not been getting much regular exercise.  He lives close to the [facility] but has not been using the [facility] lately.  He does walk in his neighborhood.  He has been busy looking after his mother who lives in a nearby nursing home and has dementia.

## 2012-06-05 ENCOUNTER — Telehealth: Payer: Self-pay | Admitting: Cardiology

## 2012-06-05 NOTE — Telephone Encounter (Signed)
Pt's wife calling  [facility] mail order pt needs 90day vs 30 day pls call [phone]

## 2012-06-07 ENCOUNTER — Ambulatory Visit (INDEPENDENT_AMBULATORY_CARE_PROVIDER_SITE_OTHER): Payer: Medicare Other | Admitting: *Deleted

## 2012-06-07 ENCOUNTER — Encounter: Payer: Self-pay | Admitting: Internal Medicine

## 2012-06-07 DIAGNOSIS — I4729 Other ventricular tachycardia: Secondary | ICD-10-CM

## 2012-06-07 DIAGNOSIS — I472 Ventricular tachycardia: Secondary | ICD-10-CM

## 2012-06-08 LAB — REMOTE ICD DEVICE
HV IMPEDENCE: 45 Ohm
RV LEAD AMPLITUDE: 7.5 mv
RV LEAD IMPEDENCE ICD: 310 Ohm
TZAT-0001FASTVT: 1
TZAT-0001SLOWVT: 1
TZAT-0012FASTVT: 200 ms
TZAT-0020FASTVT: 1 ms
TZAT-0020SLOWVT: 1 ms
TZON-0003FASTVT: 280 ms
TZON-0004FASTVT: 24
TZON-0010FASTVT: 80 ms
TZON-0010SLOWVT: 80 ms
TZST-0001FASTVT: 5
TZST-0001SLOWVT: 2
TZST-0001SLOWVT: 4
TZST-0001SLOWVT: 5
TZST-0003FASTVT: 30 J
TZST-0003FASTVT: 36 J
TZST-0003SLOWVT: 36 J
VENTRICULAR PACING ICD: 1 pct

## 2012-06-22 ENCOUNTER — Encounter: Payer: Self-pay | Admitting: *Deleted

## 2012-07-02 ENCOUNTER — Other Ambulatory Visit: Payer: Self-pay | Admitting: *Deleted

## 2012-07-02 MED ORDER — NITROGLYCERIN 0.2 MG/HR TD PT24: 1.0000 | MEDICATED_PATCH | Freq: Every day | TRANSDERMAL | Status: DC

## 2012-07-02 NOTE — Telephone Encounter (Signed)
Refilled nitro patch

## 2012-08-28 ENCOUNTER — Encounter: Payer: Self-pay | Admitting: Internal Medicine

## 2012-08-28 ENCOUNTER — Ambulatory Visit (INDEPENDENT_AMBULATORY_CARE_PROVIDER_SITE_OTHER): Payer: Medicare Other | Admitting: Internal Medicine

## 2012-08-28 VITALS — BP 130/72 | HR 58 | Ht 69.0 in | Wt 182.8 lb

## 2012-08-28 DIAGNOSIS — I472 Ventricular tachycardia: Secondary | ICD-10-CM

## 2012-08-28 DIAGNOSIS — I2589 Other forms of chronic ischemic heart disease: Secondary | ICD-10-CM

## 2012-08-28 DIAGNOSIS — I4729 Other ventricular tachycardia: Secondary | ICD-10-CM

## 2012-08-28 DIAGNOSIS — I255 Ischemic cardiomyopathy: Secondary | ICD-10-CM | POA: Insufficient documentation

## 2012-08-28 LAB — ICD DEVICE OBSERVATION
BATTERY VOLTAGE: 2.92 V
RV LEAD AMPLITUDE: 10.9 mv
RV LEAD THRESHOLD: 1.25 V
TZAT-0001FASTVT: 1
TZAT-0012SLOWVT: 200 ms
TZAT-0013SLOWVT: 3
TZAT-0018FASTVT: NEGATIVE
TZAT-0019FASTVT: 7.5 V
TZAT-0019SLOWVT: 7.5 V
TZAT-0020FASTVT: 1 ms
TZAT-0020SLOWVT: 1 ms
TZON-0004FASTVT: 24
TZON-0004SLOWVT: 35
TZON-0005FASTVT: 6
TZON-0005SLOWVT: 6
TZON-0010FASTVT: 80 ms
TZST-0001FASTVT: 4
TZST-0001FASTVT: 5
TZST-0001SLOWVT: 2
TZST-0001SLOWVT: 4
TZST-0003FASTVT: 30 J
TZST-0003SLOWVT: 30 J
TZST-0003SLOWVT: 36 J
VENTRICULAR PACING ICD: 1 pct

## 2012-08-28 MED ORDER — LISINOPRIL 2.5 MG PO TABS: 2.5000 mg | ORAL_TABLET | Freq: Every day | ORAL | Status: DC

## 2012-08-28 NOTE — Assessment & Plan Note (Signed)
Recurrent ventricular tachycardia terminated with ATP. We'll add enalapril the context of his ischemic heart disease and mild myopathy; he is advised regarding driving restrictions

## 2012-08-28 NOTE — Patient Instructions (Addendum)
Remote monitoring is used to monitor your Pacemaker of ICD from home. This monitoring reduces the number of office visits required to check your device to one time per year. It allows [place] to keep an eye on the functioning of your device to ensure it is working properly. You are scheduled for a device check from home on December 03, 2012. You may send your transmission at any time that day. If you have a wireless device, the transmission will be sent automatically. After your physician reviews your transmission, you will receive a postcard with your next transmission date.  Your physician wants you to follow-up in: 6 months with Dr Graciela Husbands.  You will receive a reminder letter in the mail two months in advance. If you don't receive a letter, please call our office to schedule the follow-up appointment.  Start Lisinopril 2.5mg  daily.  Your physician recommends that you return for lab work in: 3 weeks.  BMET

## 2012-08-28 NOTE — Assessment & Plan Note (Signed)
Ejection fraction 45%. We'll begin ACE inhibitor therapy. I reviewed side effects. He'll need a metabolic profile in 3 weeks.

## 2012-08-28 NOTE — Progress Notes (Signed)
Patient Care Team: Katy Apo, MD as PCP - General (Internal Medicine)   HPI  Joel Lin is a 71 y.o. male is seen in followup for aborted sudden cardiac death in the setting of ischemic heart disease prior PCI and mild depression of LV systolic function. he is status post ICD implantation.  There has been an intercurrent Myoview scan in May; ejection fraction had improved to 53%; previous IMI evident.  There have been no intercurrent discharges. He denies chest pain or shortness of breath.  He has a history of prior inappropriate ICD discharges secondary to sinus tachycardia    He has had intercurrent ventricular tachycardia terminated with antitachycardia pacing this was unassociated with symptoms. He had similar episodes in January which prompted [place] to increase his atenolol. He denies chest pain shortness of breath or exercise intolerance  Past Medical History  Diagnosis Date  . Coronary artery disease     myoview 2011>>EF of 53% / large area of old inferolateral infarct but no reversible ischemia          . Dyslipidemia   . Cardiac arrest 03/29/2008    at home without resuscitation anywhere from 7-10 minutes before EMS arrived/ The patient was noted to be in ventricular fibrillation  required defibrillation x3               . Hypercoagulable state     Questionable history of a borderline hypercoagulable state  . Hyperlipidemia   . Anoxic brain damage      resolved  . Respiratory failure     Respiratory failure and pulmonary collapse requiring intubation  . 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 PVCs (premature ventricular contractions)     occasionally    Past Surgical History  Procedure Date  . Cholecystectomy, laparoscopic 08/13/2009    Calculous cholecystitis  . Insert / replace / remove pacemaker 04/07/2008      cardioverter defibrillator, [employer] Single-chamber defibrillator implantation with intraoperative defibrillation  threshold testing /  Durata 7121 dual-coil active defibrillator lead, serial  #[id number]   . Cardiac catheterization  04/04/2008     EF is around 35%./ Coronary artery disease primarily involving the LAD in the  circumflex artery.  The LAD stent is widely patent and this vessel appears to be very nice  . Coronary angioplasty with stent placement 1999    PTCA and stenting of his left anterior descending artery    Current Outpatient Prescriptions  Medication Sig Dispense Refill  . aspirin 81 MG tablet Take 81 mg by mouth daily.        Marland Kitchen atenolol (TENORMIN) 25 MG tablet Take 1/2 tablet by mouth twice daily.      . Cholecalciferol (VITAMIN D-3 PO) Take 300 mg by mouth 2 (two) times daily.        . fluticasone (FLONASE) 50 MCG/ACT nasal spray Place 2 sprays into the nose daily. As needed      . GLUCOSAMINE PO Take by mouth. Taking DS daily      . IBUPROFEN PO Take 1-2 tablets by mouth as needed.        . loratadine (CLARITIN) 10 MG tablet Take 10 mg by mouth daily.      . multivitamin (THERAGRAN) per tablet Take 1 tablet by mouth daily.        . niacin (NIASPAN) 500 MG CR tablet Take 1,000 mg by mouth at bedtime.       . nitroGLYCERIN (NITRODUR - DOSED  IN MG/24 HR) 0.2 mg/hr Place 1 patch (0.2 mg total) onto the skin daily.  90 patch  3  . nitroGLYCERIN (NITROSTAT) 0.4 MG SL tablet Place 1 tablet (0.4 mg total) under the tongue every 5 (five) minutes as needed.  25 tablet  3  . Omega-3 Fatty Acids (FISH OIL PO) Take 1 capsule by mouth 2 (two) times daily.        . colesevelam (WELCHOL) 625 MG tablet Take 3 tablets (1,875 mg total) by mouth 2 (two) times daily with a meal.  540 tablet  3    Allergies  Allergen Reactions  . Clopidogrel Bisulfate   . Iodine     Review of Systems negative except from HPI and PMH  Physical Exam BP 130/72  Pulse 58  Ht 5\' 9"  (1.753 m)  Wt 182 lb 12.8 oz (82.918 kg)  BMI 26.99 kg/m2  SpO2 98% Well developed and well nourished in no acute distress HENT  normal E scleral and icterus clear Neck Supple JVP flat; carotids brisk and full Clear to ausculation  Device pocket well healed; without hematoma or erythema Regular rate and rhythm, no murmurs gallops or rub Soft with active bowel sounds No clubbing cyanosis none Edema Alert and oriented, grossly normal motor and sensory function Skin Warm and Dry    Assessment and  Plan

## 2012-08-28 NOTE — Assessment & Plan Note (Signed)
.  dfbn The patient's device was interrogated.  The information was reviewed. No changes were made in the programming.    

## 2012-08-29 ENCOUNTER — Telehealth: Payer: Self-pay | Admitting: Internal Medicine

## 2012-08-29 NOTE — Telephone Encounter (Signed)
I spoke with the patient's wife and answered her questions. 

## 2012-08-29 NOTE — Telephone Encounter (Signed)
New problem:   Need clarification on office visit from yesterday. - lab work

## 2012-09-03 ENCOUNTER — Telehealth: Payer: Self-pay | Admitting: Internal Medicine

## 2012-09-03 NOTE — Telephone Encounter (Signed)
New Problem:    Patient's wife called in because the lisinopril (PRINIVIL,ZESTRIL) 2.5 MG tablet is not agreeing with him.  Please call back.

## 2012-09-03 NOTE — Telephone Encounter (Signed)
I spoke with the patient's wife. She states the patient was on lisinopril for about 2 days and started to develop SOB with walking and just feeling "under the weather." She states the patient stopped this and has felt back to normal since. He was only on a low dose lisinopril 2.5 mg once daily started at his 08/28/12 office visit. I advised I would let Dr. Graciela Husbands know and will call back with any recommendations. I explained he is out of the office until next week. The patient did not check his blood pressure while on lisinopril.

## 2012-09-06 ENCOUNTER — Telehealth: Payer: Self-pay | Admitting: Cardiology

## 2012-09-06 DIAGNOSIS — I472 Ventricular tachycardia: Secondary | ICD-10-CM

## 2012-09-06 DIAGNOSIS — I2510 Atherosclerotic heart disease of native coronary artery without angina pectoris: Secondary | ICD-10-CM

## 2012-09-06 DIAGNOSIS — I4729 Other ventricular tachycardia: Secondary | ICD-10-CM

## 2012-09-06 MED ORDER — ATENOLOL 25 MG PO TABS: ORAL_TABLET | ORAL | Status: DC

## 2012-09-06 NOTE — Telephone Encounter (Signed)
pls call me  

## 2012-09-06 NOTE — Telephone Encounter (Signed)
Pt wanted Atenolol refilled.  Rx was sent to [facility] per Mrs Okimoto's request.  She was notified that it was done.

## 2012-09-14 ENCOUNTER — Telehealth: Payer: Self-pay | Admitting: Internal Medicine

## 2012-09-14 NOTE — Telephone Encounter (Signed)
Pt's wife, Marylu Lund, states that she has been waiting on return call from Dr. Graciela Husbands or his nurse concerning Lisinopril. The pt. was prescribed Lisinopril 2.5 mg at last OV with Dr. Graciela Husbands on 9/17. She states that the pt. only took Lisinopril for 2 days and developed sob and "felt bad" so he stopped taking. She does not know what his BP is at this time but states he is feeling better since stopping Lisinopril. She wants to know if this is ok with Dr. Graciela Husbands and if he wants pt. to take something else. Please advise./LV

## 2012-09-14 NOTE — Telephone Encounter (Signed)
spouse wants a call regarding medication and will not give any other information

## 2012-09-17 ENCOUNTER — Other Ambulatory Visit: Payer: Medicare Other

## 2012-09-17 NOTE — Telephone Encounter (Signed)
Joel Lin, can you please  set up an appointment for 3-4 months. And arrange with one of the nurses to have him have a prescription for losartan 25 mg to start two weeks before he comes to see me Thanks steve

## 2012-10-05 ENCOUNTER — Telehealth: Payer: Self-pay | Admitting: Internal Medicine

## 2012-10-05 NOTE — Telephone Encounter (Signed)
Follow-up:    Sherryl Manges, MD 09/17/2012 5:39 PM Signed  Marlowe Kays, can you please set up an appointment for 3-4 months. And arrange with one of the nurses to have him have a prescription for losartan 25 mg to start two weeks before he comes to see me  Thanks steve  Patient has an appointment on 01/08/12 @ 10:30am and needs to have her prescription for losartan filled two to three weeks prior.

## 2012-10-15 ENCOUNTER — Telehealth: Payer: Self-pay | Admitting: Internal Medicine

## 2012-10-15 NOTE — Telephone Encounter (Signed)
Spoke with patient and interrogated his device in office, no episodes.

## 2012-10-15 NOTE — Telephone Encounter (Signed)
plz return call to pt [phone] regarding minor jolts from pacer device felt on Saturday 10/13/12.   Pt has experienced shock before and does not think it was a full fledge shock.  Please return call to discuss and advise.

## 2012-11-06 ENCOUNTER — Telehealth: Payer: Self-pay | Admitting: Cardiology

## 2012-11-06 MED ORDER — NIACIN ER (ANTIHYPERLIPIDEMIC) 1000 MG PO TBCR: 1000.0000 mg | EXTENDED_RELEASE_TABLET | Freq: Every day | ORAL | Status: DC

## 2012-11-06 NOTE — Telephone Encounter (Signed)
Patient wanting Rx for generic Niaspan, sent to [facility] (ok per  Dr. Patty Sermons )

## 2012-11-06 NOTE — Telephone Encounter (Signed)
New Problem:    Patient's wife called in wanting to speak with you.  When asked, patient declined to disclose the reason for the call.  Please call back.

## 2012-11-12 ENCOUNTER — Encounter: Payer: Self-pay | Admitting: Vascular Surgery

## 2012-11-13 ENCOUNTER — Ambulatory Visit (INDEPENDENT_AMBULATORY_CARE_PROVIDER_SITE_OTHER): Payer: Medicare Other | Admitting: Vascular Surgery

## 2012-11-13 ENCOUNTER — Encounter (INDEPENDENT_AMBULATORY_CARE_PROVIDER_SITE_OTHER): Payer: Medicare Other | Admitting: *Deleted

## 2012-11-13 ENCOUNTER — Encounter: Payer: Self-pay | Admitting: Vascular Surgery

## 2012-11-13 VITALS — BP 141/78 | HR 57 | Resp 16 | Ht 69.0 in | Wt 183.0 lb

## 2012-11-13 DIAGNOSIS — I714 Abdominal aortic aneurysm, without rupture, unspecified: Secondary | ICD-10-CM

## 2012-11-13 NOTE — Progress Notes (Signed)
The patient presents today for followup of his infrarenal abdominal aortic aneurysm. He reports no new cardiac or pulmonary dysfunction. He does have a history of severe cardiomyopathy and coronary artery disease. He does have a implantable defibrillator. He has no symptoms referable to his aneurysm and reports is been stable from a cardiac standpoint.  Past Medical History  Diagnosis Date  . Coronary artery disease     myoview 2011>>EF of 53% / large area of old inferolateral infarct but no reversible ischemia          . Dyslipidemia   . Cardiac arrest 03/29/2008    at home without resuscitation anywhere from 7-10 minutes before EMS arrived/ The patient was noted to be in ventricular fibrillation  required defibrillation x3               . Hypercoagulable state     Questionable history of a borderline hypercoagulable state  . Hyperlipidemia   . Anoxic brain damage      resolved  . Respiratory failure     Respiratory failure and pulmonary collapse requiring intubation  . 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 PVCs (premature ventricular contractions)     occasionally  . Peripheral vascular disease   . Myocardial infarction     History  Substance Use Topics  . Smoking status: Former Smoker -- 1.0 packs/day for 20 years    Types: Cigarettes    Quit date: 12/23/1979  . Smokeless tobacco: Never Used  . Alcohol Use: No    Family History  Problem Relation Age of Onset  . Coronary artery disease      strong family history of   . Hyperlipidemia Mother   . Heart disease Father     before age 10  . Hyperlipidemia Father     Allergies  Allergen Reactions  . Clopidogrel Bisulfate   . Iodine   . Metoprolol   . Statins     Current outpatient prescriptions:aspirin 81 MG tablet, Take 81 mg by mouth daily.  , Disp: , Rfl: ;  atenolol (TENORMIN) 25 MG tablet, Take 1/2 tablet by mouth twice daily., Disp: 90 tablet, Rfl: 3;  Cholecalciferol (VITAMIN D-3  PO), Take 300 mg by mouth 2 (two) times daily.  , Disp: , Rfl: ;  colesevelam (WELCHOL) 625 MG tablet, Take 3 tablets (1,875 mg total) by mouth 2 (two) times daily with a meal., Disp: 540 tablet, Rfl: 3 fluticasone (FLONASE) 50 MCG/ACT nasal spray, Place 2 sprays into the nose daily. As needed, Disp: , Rfl: ;  GLUCOSAMINE PO, Take by mouth. Taking DS daily, Disp: , Rfl: ;  IBUPROFEN PO, Take 1-2 tablets by mouth as needed.  , Disp: , Rfl: ;  loratadine (CLARITIN) 10 MG tablet, Take 10 mg by mouth daily., Disp: , Rfl: ;  multivitamin (THERAGRAN) per tablet, Take 1 tablet by mouth daily.  , Disp: , Rfl:  niacin (NIASPAN) 1000 MG CR tablet, Take 1 tablet (1,000 mg total) by mouth at bedtime., Disp: 90 tablet, Rfl: 3;  nitroGLYCERIN (NITRODUR - DOSED IN MG/24 HR) 0.2 mg/hr, Place 1 patch (0.2 mg total) onto the skin daily., Disp: 90 patch, Rfl: 3;  nitroGLYCERIN (NITROSTAT) 0.4 MG SL tablet, Place 1 tablet (0.4 mg total) under the tongue every 5 (five) minutes as needed., Disp: 25 tablet, Rfl: 3 Omega-3 Fatty Acids (FISH OIL PO), Take 1 capsule by mouth 2 (two) times daily.  , Disp: , Rfl: ;  lisinopril (PRINIVIL,ZESTRIL) 2.5 MG  tablet, Take 1 tablet (2.5 mg total) by mouth daily., Disp: 30 tablet, Rfl: 3  BP 141/78  Pulse 57  Resp 16  Ht 5\' 9"  (1.753 m)  Wt 183 lb (83.008 kg)  BMI 27.02 kg/m2  SpO2 98%  Body mass index is 27.02 kg/(m^2).       Physical exam: Well developed well-nourished white male in no acute distress Pulse status 2+ radial and 2+ femoral pulses bilaterally no evidence of popliteal artery aneurysm with palpable popliteal and distal pulses. Heart regular rate and rhythm Abdomen soft nontender prominent aortic pulsation with no tenderness over the aneurysm. Carotid arteries without bruits bilaterally  Lab in our office today reveals an increased size of to maximal diameter of 4.75 cm as compared to a maximum diameter 4.3 cm in November of 2012  Impression and plan:  Asymptomatic infrarenal abdominal aortic aneurysm with 4-5 mm growth in one year. I again reviewed symptoms of leaking aneurysm with the patient and his family and then immediately teach per second at this should occur. Otherwise we will see him in 6 months. I had discussed possibility of CT angiogram for further definition at that time. He does have a history of allergic reaction with hives in no respiratory distress with contrast in the past and has been pretreated. He has had some difficulty with the steroid pretreatment causing increased rate. For this reason we will continue ultrasound followup onlay and reserve CT angiogram for more growth. We will increase his interval to every 6 months since he has had a growth up to the 4.75 cm range

## 2012-11-14 NOTE — Addendum Note (Signed)
Addended by: Sharee Pimple on: 11/14/2012 12:49 PM   Modules accepted: Orders

## 2012-11-26 ENCOUNTER — Other Ambulatory Visit: Payer: Self-pay | Admitting: *Deleted

## 2012-11-26 MED ORDER — LOSARTAN POTASSIUM 25 MG PO TABS: 25.0000 mg | ORAL_TABLET | Freq: Every day | ORAL | Status: DC

## 2012-11-26 NOTE — Telephone Encounter (Signed)
Pt reminded of appt and Rx for losartan 25mg  sent in.

## 2012-11-27 ENCOUNTER — Telehealth: Payer: Self-pay | Admitting: Cardiology

## 2012-11-27 DIAGNOSIS — I493 Ventricular premature depolarization: Secondary | ICD-10-CM

## 2012-11-27 DIAGNOSIS — Z8349 Family history of other endocrine, nutritional and metabolic diseases: Secondary | ICD-10-CM

## 2012-11-27 DIAGNOSIS — Z1329 Encounter for screening for other suspected endocrine disorder: Secondary | ICD-10-CM

## 2012-11-27 DIAGNOSIS — Z8249 Family history of ischemic heart disease and other diseases of the circulatory system: Secondary | ICD-10-CM

## 2012-11-27 NOTE — Telephone Encounter (Signed)
Spoke with patient and PCP wanted labs. Unable to get a diagnosis to cover TSH

## 2012-11-27 NOTE — Telephone Encounter (Signed)
New problem:    Need TSH added to lab on 1/6.

## 2012-12-03 ENCOUNTER — Encounter: Payer: Self-pay | Admitting: Internal Medicine

## 2012-12-03 ENCOUNTER — Ambulatory Visit (INDEPENDENT_AMBULATORY_CARE_PROVIDER_SITE_OTHER): Payer: Medicare Other | Admitting: *Deleted

## 2012-12-03 DIAGNOSIS — Z95810 Presence of automatic (implantable) cardiac defibrillator: Secondary | ICD-10-CM

## 2012-12-03 DIAGNOSIS — I2589 Other forms of chronic ischemic heart disease: Secondary | ICD-10-CM

## 2012-12-03 DIAGNOSIS — I255 Ischemic cardiomyopathy: Secondary | ICD-10-CM

## 2012-12-06 LAB — REMOTE ICD DEVICE
BATTERY VOLTAGE: 2.8 V
BRDY-0002RV: 40 {beats}/min
DEVICE MODEL ICD: 549899
RV LEAD AMPLITUDE: 8.3 mv
TZAT-0001SLOWVT: 1
TZAT-0013FASTVT: 1
TZAT-0018FASTVT: NEGATIVE
TZAT-0018SLOWVT: NEGATIVE
TZAT-0019SLOWVT: 7.5 V
TZAT-0020FASTVT: 1 ms
TZAT-0020SLOWVT: 1 ms
TZON-0003SLOWVT: 330 ms
TZON-0004SLOWVT: 35
TZON-0005FASTVT: 6
TZON-0010FASTVT: 80 ms
TZON-0010SLOWVT: 80 ms
TZST-0001FASTVT: 2
TZST-0001SLOWVT: 2
TZST-0001SLOWVT: 3
TZST-0001SLOWVT: 4
TZST-0003FASTVT: 36 J
TZST-0003FASTVT: 36 J
TZST-0003SLOWVT: 20 J
TZST-0003SLOWVT: 36 J
VENTRICULAR PACING ICD: 1 pct

## 2012-12-07 ENCOUNTER — Telehealth: Payer: Self-pay | Admitting: Cardiology

## 2012-12-07 NOTE — Telephone Encounter (Signed)
Pt feeling tight at top of throat and happen during the night and wants to be seen today

## 2012-12-07 NOTE — Telephone Encounter (Signed)
Pt. Is concerned that his HR was 48 when he woke up during the night last night and wants his device checked today. He states his HR is normal at this time and he is not having any problems. Pt. advised that HR does drop while at rest and that he should not be concerned. He asked to speak to [facility] in the device clinic, call forwarded to her.

## 2012-12-07 NOTE — Telephone Encounter (Signed)
Spoke with patient and explained that his device is programmed VVI 40 and is functioning as programmed.   Follow up as scheduled.

## 2012-12-17 ENCOUNTER — Other Ambulatory Visit (INDEPENDENT_AMBULATORY_CARE_PROVIDER_SITE_OTHER): Payer: Medicare Other

## 2012-12-17 DIAGNOSIS — I472 Ventricular tachycardia: Secondary | ICD-10-CM

## 2012-12-17 DIAGNOSIS — E7800 Pure hypercholesterolemia, unspecified: Secondary | ICD-10-CM

## 2012-12-17 DIAGNOSIS — E785 Hyperlipidemia, unspecified: Secondary | ICD-10-CM

## 2012-12-17 DIAGNOSIS — I4729 Other ventricular tachycardia: Secondary | ICD-10-CM

## 2012-12-17 DIAGNOSIS — I255 Ischemic cardiomyopathy: Secondary | ICD-10-CM

## 2012-12-17 LAB — HEPATIC FUNCTION PANEL
AST: 32 U/L (ref 0–37)
Albumin: 3.8 g/dL (ref 3.5–5.2)
Alkaline Phosphatase: 54 U/L (ref 39–117)
Bilirubin, Direct: 0 mg/dL (ref 0.0–0.3)
Total Protein: 7.3 g/dL (ref 6.0–8.3)

## 2012-12-17 LAB — BASIC METABOLIC PANEL
CO2: 29 mEq/L (ref 19–32)
Calcium: 8.9 mg/dL (ref 8.4–10.5)
GFR: 74.11 mL/min (ref >60.00)
Glucose, Bld: 105 mg/dL — ABNORMAL HIGH (ref 70–99)
Potassium: 4.1 mEq/L (ref 3.5–5.1)
Sodium: 140 mEq/L (ref 135–145)

## 2012-12-17 LAB — LIPID PANEL
Total CHOL/HDL Ratio: 5
VLDL: 35.6 mg/dL (ref 0.0–40.0)

## 2012-12-17 NOTE — Progress Notes (Signed)
Quick Note:  Please make copy of labs for patient visit. ______ 

## 2012-12-18 ENCOUNTER — Encounter: Payer: Self-pay | Admitting: *Deleted

## 2012-12-24 ENCOUNTER — Encounter: Payer: Self-pay | Admitting: Cardiology

## 2012-12-24 ENCOUNTER — Ambulatory Visit (INDEPENDENT_AMBULATORY_CARE_PROVIDER_SITE_OTHER): Payer: Medicare Other | Admitting: Cardiology

## 2012-12-24 VITALS — BP 128/70 | HR 66 | Resp 18 | Ht 68.0 in | Wt 184.0 lb

## 2012-12-24 DIAGNOSIS — I4729 Other ventricular tachycardia: Secondary | ICD-10-CM

## 2012-12-24 DIAGNOSIS — I472 Ventricular tachycardia, unspecified: Secondary | ICD-10-CM

## 2012-12-24 DIAGNOSIS — E785 Hyperlipidemia, unspecified: Secondary | ICD-10-CM

## 2012-12-24 DIAGNOSIS — I2510 Atherosclerotic heart disease of native coronary artery without angina pectoris: Secondary | ICD-10-CM

## 2012-12-24 DIAGNOSIS — I2589 Other forms of chronic ischemic heart disease: Secondary | ICD-10-CM

## 2012-12-24 DIAGNOSIS — I255 Ischemic cardiomyopathy: Secondary | ICD-10-CM

## 2012-12-24 DIAGNOSIS — J329 Chronic sinusitis, unspecified: Secondary | ICD-10-CM

## 2012-12-24 DIAGNOSIS — E782 Mixed hyperlipidemia: Secondary | ICD-10-CM

## 2012-12-24 DIAGNOSIS — R635 Abnormal weight gain: Secondary | ICD-10-CM

## 2012-12-24 LAB — T4, FREE: Free T4: 0.64 ng/dL (ref 0.60–1.60)

## 2012-12-24 NOTE — Progress Notes (Signed)
Joel Lin Date of Birth:  11-13-1941 [facility] [address] [place], [place]  [address] [phone]         Fax   [phone]  History of Present Illness: This pleasant 72 year old gentleman is seen for a four-month followup office visit. He has a complex past medical history. He has a history of known ischemic heart disease. He has a history of a remote myocardial infarction at the age of 68. There was a large posterolateral myocardial infarction in 1981. The patient had angioplasty and stent of his LAD in 1999. In 2009 he had an out of hospital cardiac arrest at home was successfully resuscitated and treated with hypothermia without significant neurologic sequelae. He now has a defibrillator in place and is followed by Dr. Graciela Husbands. He has a history of hypercholesterolemia. His last nuclear stress test was 10/24/11 and showed an ejection fraction of 45% and a large posterolateral scar but no ischemia.  His last echocardiogram was in 2009 and showed an ejection fraction of 35-40%.  The patient has a known abdominal aortic aneurysm followed by Dr. Arbie Cookey.  His most recent ultrasound showed that the aneurysm had increased in size from 4.5 up to 4.8 cm so he will now be checked at six-month intervals. The patient has his defibrillator monitored through Dr. Odessa Fleming office. In January 2013 he had 2 episodes of ventricular tachycardia which were asymptomatic. He has had no inappropriate ICD discharges.  Since we last saw him he had a trial of lisinopril which caused shortness of breath.  He was then switched to losartan which caused him to have prolonged chest pain after the initial dose and he stopped it.  He is now going to restart lisinopril in low dose of 2.5 mg daily.   Current Outpatient Prescriptions  Medication Sig Dispense Refill  . aspirin 81 MG tablet Take 81 mg by mouth daily.        Marland Kitchen atenolol (TENORMIN) 25 MG tablet Take 1/2 tablet by mouth twice daily.  90  tablet  3  . Cholecalciferol (VITAMIN D-3 PO) Take 300 mg by mouth 2 (two) times daily.        . colesevelam (WELCHOL) 625 MG tablet Take 3 tablets (1,875 mg total) by mouth 2 (two) times daily with a meal.  540 tablet  3  . fluticasone (FLONASE) 50 MCG/ACT nasal spray Place 2 sprays into the nose daily. As needed      . GLUCOSAMINE PO Take by mouth. Taking DS daily      . guaiFENesin (MUCINEX) 600 MG 12 hr tablet Take 600 mg by mouth 2 (two) times daily as needed.      . IBUPROFEN PO Take 1-2 tablets by mouth as needed.        Marland Kitchen lisinopril (PRINIVIL,ZESTRIL) 2.5 MG tablet Take 1 tablet (2.5 mg total) by mouth daily.  30 tablet  3  . loratadine (CLARITIN) 10 MG tablet Take 10 mg by mouth daily.      . multivitamin (THERAGRAN) per tablet Take 1 tablet by mouth daily.        . niacin (NIASPAN) 1000 MG CR tablet Take 1 tablet (1,000 mg total) by mouth at bedtime.  90 tablet  3  . nitroGLYCERIN (NITRODUR - DOSED IN MG/24 HR) 0.2 mg/hr Place 1 patch (0.2 mg total) onto the skin daily.  90 patch  3  . nitroGLYCERIN (NITROSTAT) 0.4 MG SL tablet Place 1 tablet (0.4 mg total) under the tongue every 5 (  five) minutes as needed.  25 tablet  3  . Omega-3 Fatty Acids (FISH OIL PO) Take 1 capsule by mouth 2 (two) times daily.          Allergies  Allergen Reactions  . Losartan Potassium Palpitations  . Clopidogrel Bisulfate   . Iodine   . Metoprolol   . Statins     Patient Active Problem List  Diagnosis  . HYPERLIPIDEMIA, MIXED  . CORONARY ATHEROSCLEROSIS  . VENTRICULAR FIBRILLATION  . PVC's (premature ventricular contractions)  . Abdominal aortic aneurysm  . Paroxysmal ventricular tachycardia  . ICD (implantable cardiac defibrillator), single, [employer]  . Ischemic cardiomyopathy  . Abdominal aneurysm without mention of rupture  . Weight gain    History  Smoking status  . Former Smoker -- 1.0 packs/day for 20 years  . Types: Cigarettes  . Quit date: 12/23/1979  Smokeless tobacco  . Never  Used    History  Alcohol Use No    Family History  Problem Relation Age of Onset  . Coronary artery disease      strong family history of   . Hyperlipidemia Mother   . Heart disease Father     before age 67  . Hyperlipidemia Father     Review of Systems: Constitutional: no fever chills diaphoresis or fatigue or change in weight.  Head and neck: no hearing loss, no epistaxis, no photophobia or visual disturbance. Respiratory: No cough, shortness of breath or wheezing. Cardiovascular: No chest pain peripheral edema, palpitations. Gastrointestinal: No abdominal distention, no abdominal pain, no change in bowel habits hematochezia or melena. Genitourinary: No dysuria, no frequency, no urgency, no nocturia. Musculoskeletal:No arthralgias, no back pain, no gait disturbance or myalgias. Neurological: No dizziness, no headaches, no numbness, no seizures, no syncope, no weakness, no tremors. Hematologic: No lymphadenopathy, no easy bruising. Psychiatric: No confusion, no hallucinations, no sleep disturbance.    Physical Exam: Filed Vitals:   12/24/12 0906  BP: 128/70  Pulse: 66  Resp: 18   the general appearance reveals a well-developed well-nourished gentleman in no distress.The head and neck exam reveals pupils equal and reactive.  Extraocular movements are full.  There is no scleral icterus.  The mouth and pharynx are normal.  The neck is supple.  The carotids reveal no bruits.  The jugular venous pressure is normal.  The  thyroid is not enlarged.  There is no lymphadenopathy.  The chest is clear to percussion and auscultation.  There are no rales or rhonchi.  Expansion of the chest is symmetrical.  The precordium is quiet.  The first heart sound is normal.  The second heart sound is physiologically split.  There is no murmur gallop rub or click.  There is no abnormal lift or heave.  The abdomen is soft and nontender.  The bowel sounds are normal.  The liver and spleen are not  enlarged.  There are no abdominal masses.  There are no abdominal bruits.  Extremities reveal good pedal pulses.  There is no phlebitis or edema.  There is no cyanosis or clubbing.  Strength is normal and symmetrical in all extremities.  There is no lateralizing weakness.  There are no sensory deficits.  The skin is warm and dry.  There is no rash.    Assessment / Plan: Work harder on weight loss and regular walking exercise.  Because of his decreased LV systolic function we want him to try lisinopril again.  We are going to update his two-dimensional echocardiogram.  Recheck  in 4 months for followup office visit lipid panel hepatic function panel and basal metabolic panel.

## 2012-12-24 NOTE — Patient Instructions (Signed)
Will obtain labs today and call you with the results (t12f/tsh)  ADD PLAIN MUCINEX 600 MG TWICE A DAY  RESTART LISINOPRIL 2.5 MG DAILY  Your physician has requested that you have an echocardiogram. Echocardiography is a painless test that uses sound waves to create images of your heart. It provides your doctor with information about the size and shape of your heart and how well your heart's chambers and valves are working. This procedure takes approximately one hour. There are no restrictions for this procedure.  Your physician wants you to follow-up in: 4 months with fasting labs (lp/bmet/hfp)  You will receive a reminder letter in the mail two months in advance. If you don't receive a letter, please call our office to schedule the follow-up appointment.

## 2012-12-24 NOTE — Assessment & Plan Note (Signed)
The patient has not been having any chest pain to suggest angina pectoris.  He has had some dyspepsia related to possible hiatal hernia.  He has gained weight since last visit and has not been exercising as much.

## 2012-12-24 NOTE — Assessment & Plan Note (Signed)
The patient has gained weight and his triglycerides and blood sugar are higher.  He is going to work harder on weight loss and restart a program of regular walking exercise.  He also wonders about his thyroid and we are checking a TSH and a free T4 today.

## 2012-12-24 NOTE — Assessment & Plan Note (Signed)
The patient has been having a lot of sinus congestion and postnasal drainage.  The morning he coughs up thick mucus.  We will add Mucinex 600 mg twice a day.

## 2012-12-26 ENCOUNTER — Telehealth: Payer: Self-pay | Admitting: *Deleted

## 2012-12-26 NOTE — Telephone Encounter (Signed)
Message copied by Burnell Blanks on Wed Dec 26, 2012  8:28 AM ------      Message from: Cassell Clement      Created: Mon Dec 24, 2012  8:53 PM       Thyroid levels are normal.

## 2012-12-26 NOTE — Telephone Encounter (Signed)
Advised patient of lab results  

## 2012-12-31 ENCOUNTER — Ambulatory Visit (HOSPITAL_COMMUNITY): Payer: Medicare Other | Attending: Cardiology

## 2012-12-31 DIAGNOSIS — I472 Ventricular tachycardia, unspecified: Secondary | ICD-10-CM | POA: Insufficient documentation

## 2012-12-31 DIAGNOSIS — I369 Nonrheumatic tricuspid valve disorder, unspecified: Secondary | ICD-10-CM | POA: Insufficient documentation

## 2012-12-31 DIAGNOSIS — I2510 Atherosclerotic heart disease of native coronary artery without angina pectoris: Secondary | ICD-10-CM | POA: Insufficient documentation

## 2012-12-31 DIAGNOSIS — I255 Ischemic cardiomyopathy: Secondary | ICD-10-CM

## 2012-12-31 DIAGNOSIS — I059 Rheumatic mitral valve disease, unspecified: Secondary | ICD-10-CM | POA: Insufficient documentation

## 2012-12-31 DIAGNOSIS — I4729 Other ventricular tachycardia: Secondary | ICD-10-CM

## 2012-12-31 NOTE — Progress Notes (Signed)
Echocardiogram performed.  

## 2013-01-01 ENCOUNTER — Telehealth: Payer: Self-pay | Admitting: Cardiology

## 2013-01-01 NOTE — Telephone Encounter (Signed)
Pt's wife rtn melinda's call, pls call [phone] or [phone]

## 2013-01-01 NOTE — Telephone Encounter (Signed)
F/u  Returning call back to nurse.  

## 2013-01-01 NOTE — Telephone Encounter (Signed)
Advised patient of echo  

## 2013-01-01 NOTE — Telephone Encounter (Signed)
Message copied by Burnell Blanks on Tue Jan 01, 2013  3:03 PM ------      Message from: Cassell Clement      Created: Mon Dec 31, 2012  5:49 PM       Please report.  The echo is stable. The old scar is unchanged.  Previous echo showed EF 35-40% and this echo shows 40%. The valve function is normal. CSD.

## 2013-01-07 ENCOUNTER — Encounter: Payer: Self-pay | Admitting: Internal Medicine

## 2013-01-07 ENCOUNTER — Ambulatory Visit (INDEPENDENT_AMBULATORY_CARE_PROVIDER_SITE_OTHER): Payer: Medicare Other | Admitting: Internal Medicine

## 2013-01-07 DIAGNOSIS — Z95810 Presence of automatic (implantable) cardiac defibrillator: Secondary | ICD-10-CM

## 2013-01-07 DIAGNOSIS — I4729 Other ventricular tachycardia: Secondary | ICD-10-CM

## 2013-01-07 DIAGNOSIS — I472 Ventricular tachycardia, unspecified: Secondary | ICD-10-CM

## 2013-01-07 DIAGNOSIS — I2589 Other forms of chronic ischemic heart disease: Secondary | ICD-10-CM

## 2013-01-07 DIAGNOSIS — I255 Ischemic cardiomyopathy: Secondary | ICD-10-CM

## 2013-01-07 DIAGNOSIS — I4901 Ventricular fibrillation: Secondary | ICD-10-CM

## 2013-01-07 DIAGNOSIS — E782 Mixed hyperlipidemia: Secondary | ICD-10-CM

## 2013-01-07 LAB — ICD DEVICE OBSERVATION
BATTERY VOLTAGE: 2.74 V
BRDY-0002RV: 40 {beats}/min
DEVICE MODEL ICD: 549899
HV IMPEDENCE: 50 Ohm
RV LEAD AMPLITUDE: 9.7 mv
RV LEAD IMPEDENCE ICD: 340 Ohm
TZAT-0004SLOWVT: 8
TZAT-0013FASTVT: 1
TZAT-0013SLOWVT: 3
TZAT-0018FASTVT: NEGATIVE
TZAT-0018SLOWVT: NEGATIVE
TZAT-0019FASTVT: 7.5 V
TZAT-0019SLOWVT: 7.5 V
TZAT-0020FASTVT: 1 ms
TZON-0003FASTVT: 280 ms
TZON-0003SLOWVT: 330 ms
TZON-0004FASTVT: 24
TZON-0004SLOWVT: 35
TZON-0005FASTVT: 6
TZON-0010SLOWVT: 80 ms
TZST-0001FASTVT: 2
TZST-0001SLOWVT: 2
TZST-0001SLOWVT: 3
TZST-0001SLOWVT: 5
TZST-0003FASTVT: 36 J
TZST-0003FASTVT: 36 J
TZST-0003SLOWVT: 30 J
TZST-0003SLOWVT: 36 J
TZST-0003SLOWVT: 36 J
VENTRICULAR PACING ICD: 1 pct

## 2013-01-07 MED ORDER — LISINOPRIL 2.5 MG PO TABS: 2.5000 mg | ORAL_TABLET | Freq: Every day | ORAL | Status: DC

## 2013-01-07 NOTE — Assessment & Plan Note (Signed)
No intercurrent Ventricular tachycardia  

## 2013-01-07 NOTE — Patient Instructions (Addendum)
Remote monitoring is used to monitor your Pacemaker of ICD from home. This monitoring reduces the number of office visits required to check your device to one time per year. It allows us to keep an eye on the functioning of your device to ensure it is working properly. You are scheduled for a device check from home on April 08, 2013. You may send your transmission at any time that day. If you have a wireless device, the transmission will be sent automatically. After your physician reviews your transmission, you will receive a postcard with your next transmission date.  Your physician wants you to follow-up in: 1 year with Dr Klein.  You will receive a reminder letter in the mail two months in advance. If you don't receive a letter, please call our office to schedule the follow-up appointment.  

## 2013-01-07 NOTE — Assessment & Plan Note (Signed)
I have asked him to review with Dr. Patty Sermons  The AIM-HIGH  Results and whether it's worse considering 3-4 days per weeks statin therapy again

## 2013-01-07 NOTE — Progress Notes (Signed)
Patient Care Team: Katy Apo, MD as PCP - General (Internal Medicine) Cassell Clement, MD as Attending Physician (Cardiology) Duke Salvia, MD as Attending Physician (Cardiology)   HPI  Joel Lin is a 72 y.o. male is seen in followup for aborted sudden cardiac death in the setting of ischemic heart disease prior PCI and mild depression of LV systolic function. he is status post ICD implantation.   The patient denies chest pain, shortness of breath, nocturnal dyspnea, orthopnea or peripheral edema.  There have been no palpitations, lightheadedness or syncope.    There has been   Myoview scan in May 13; ejection fraction had improved to 53%; previous IMI evident. Recent echo was corroborating  Is a AAA at 4.8 cm is followed every 6 months by Dr. early   He has a history of prior inappropriate ICD discharges secondary to sinus tachycardia   He  Had jan 2013 ventricular tachycardia terminated with antitachycardia pacing this was unassociated with symptoms treated with increased dose of beta blockers  Past Medical History  Diagnosis Date  . Coronary artery disease     myoview 2011>>EF of 53% / large area of old inferolateral infarct but no reversible ischemia          . Dyslipidemia   . Cardiac arrest 03/29/2008    at home without resuscitation anywhere from 7-10 minutes before EMS arrived/ The patient was noted to be in ventricular fibrillation  required defibrillation x3               . Hypercoagulable state     Questionable history of a borderline hypercoagulable state  . Hyperlipidemia   . Anoxic brain damage      resolved  . Respiratory failure     Respiratory failure and pulmonary collapse requiring intubation  . 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 PVCs (premature ventricular contractions)     occasionally  . Peripheral vascular disease   . Myocardial infarction     Past Surgical History  Procedure Date  .  Cholecystectomy, laparoscopic 08/13/2009    Calculous cholecystitis  . Insert / replace / remove pacemaker 04/07/2008      cardioverter defibrillator, [employer] Single-chamber defibrillator implantation with intraoperative defibrillation threshold testing /  Durata 7121 dual-coil active defibrillator lead, serial  #[id number]   . Cardiac catheterization  04/04/2008     EF is around 35%./ Coronary artery disease primarily involving the LAD in the  circumflex artery.  The LAD stent is widely patent and this vessel appears to be very nice  . Coronary angioplasty with stent placement 1999    PTCA and stenting of his left anterior descending artery  . Cholecystectomy     Current Outpatient Prescriptions  Medication Sig Dispense Refill  . aspirin 81 MG tablet Take 81 mg by mouth daily.        Marland Kitchen atenolol (TENORMIN) 25 MG tablet Take 1/2 tablet by mouth twice daily.  90 tablet  3  . Cholecalciferol (VITAMIN D-3 PO) Take 300 mg by mouth 2 (two) times daily.        . colesevelam (WELCHOL) 625 MG tablet Take 3 tablets (1,875 mg total) by mouth 2 (two) times daily with a meal.  540 tablet  3  . fluticasone (FLONASE) 50 MCG/ACT nasal spray Place 2 sprays into the nose daily. As needed      . GLUCOSAMINE PO Take by mouth. Taking DS daily      . guaiFENesin (  MUCINEX) 600 MG 12 hr tablet Take 600 mg by mouth 2 (two) times daily as needed.      . IBUPROFEN PO Take 1-2 tablets by mouth as needed.        Marland Kitchen lisinopril (PRINIVIL,ZESTRIL) 2.5 MG tablet Take 1 tablet (2.5 mg total) by mouth daily.  30 tablet  3  . loratadine (CLARITIN) 10 MG tablet Take 10 mg by mouth daily.      . multivitamin (THERAGRAN) per tablet Take 1 tablet by mouth daily.        . niacin (NIASPAN) 1000 MG CR tablet Take 1 tablet (1,000 mg total) by mouth at bedtime.  90 tablet  3  . nitroGLYCERIN (NITRODUR - DOSED IN MG/24 HR) 0.2 mg/hr Place 1 patch (0.2 mg total) onto the skin daily.  90 patch  3  . nitroGLYCERIN (NITROSTAT) 0.4 MG SL  tablet Place 1 tablet (0.4 mg total) under the tongue every 5 (five) minutes as needed.  25 tablet  3  . Omega-3 Fatty Acids (FISH OIL PO) Take 1 capsule by mouth 2 (two) times daily.          Allergies  Allergen Reactions  . Losartan Potassium Palpitations  . Clopidogrel Bisulfate   . Iodine   . Metoprolol   . Statins     Review of Systems negative except from HPI and PMH  Physical Exam BP 117/72  Pulse 61  Ht 5\' 9"  (1.753 m)  Wt 184 lb (83.462 kg)  BMI 27.17 kg/m2  SpO2 98% Well developed and nourished in no acute distress HENT normal Neck supple with JVP-flat Clear Regular rate and rhythm, no murmurs or gallops Abd-soft with active BS No Clubbing cyanosis edema Skin-warm and dry A & Oriented  Grossly normal sensory and motor function  ECG demonstrated sinus rhythm at 61 Intervals 23/11/41 Prior lateral wall MI   Assessment and  Plan

## 2013-01-07 NOTE — Assessment & Plan Note (Signed)
stble on current meds\

## 2013-01-07 NOTE — Assessment & Plan Note (Signed)
The patient's device was interrogated.  The information was reviewed. No changes were made in the programming.    

## 2013-01-08 ENCOUNTER — Other Ambulatory Visit (HOSPITAL_COMMUNITY): Payer: Medicare Other

## 2013-02-26 ENCOUNTER — Encounter (HOSPITAL_COMMUNITY): Payer: Self-pay | Admitting: Physician Assistant

## 2013-02-26 ENCOUNTER — Emergency Department (HOSPITAL_COMMUNITY): Payer: Medicare Other

## 2013-02-26 ENCOUNTER — Inpatient Hospital Stay (HOSPITAL_COMMUNITY)
Admission: EM | Admit: 2013-02-26 | Discharge: 2013-03-01 | DRG: 287 | Disposition: A | Discharge status: Home / Self Care | Payer: Medicare Other | Attending: Cardiology | Admitting: Cardiology

## 2013-02-26 DIAGNOSIS — Z7982 Long term (current) use of aspirin: Secondary | ICD-10-CM

## 2013-02-26 DIAGNOSIS — I2510 Atherosclerotic heart disease of native coronary artery without angina pectoris: Secondary | ICD-10-CM | POA: Diagnosis present

## 2013-02-26 DIAGNOSIS — Z87891 Personal history of nicotine dependence: Secondary | ICD-10-CM

## 2013-02-26 DIAGNOSIS — I714 Abdominal aortic aneurysm, without rupture, unspecified: Secondary | ICD-10-CM

## 2013-02-26 DIAGNOSIS — Z0389 Encounter for observation for other suspected diseases and conditions ruled out: Secondary | ICD-10-CM

## 2013-02-26 DIAGNOSIS — E782 Mixed hyperlipidemia: Secondary | ICD-10-CM | POA: Diagnosis present

## 2013-02-26 DIAGNOSIS — I493 Ventricular premature depolarization: Secondary | ICD-10-CM

## 2013-02-26 DIAGNOSIS — I472 Ventricular tachycardia, unspecified: Principal | ICD-10-CM | POA: Diagnosis not present

## 2013-02-26 DIAGNOSIS — I4901 Ventricular fibrillation: Secondary | ICD-10-CM

## 2013-02-26 DIAGNOSIS — E785 Hyperlipidemia, unspecified: Secondary | ICD-10-CM | POA: Diagnosis present

## 2013-02-26 DIAGNOSIS — Z4502 Encounter for adjustment and management of automatic implantable cardiac defibrillator: Secondary | ICD-10-CM

## 2013-02-26 DIAGNOSIS — Z888 Allergy status to other drugs, medicaments and biological substances status: Secondary | ICD-10-CM

## 2013-02-26 DIAGNOSIS — Z95810 Presence of automatic (implantable) cardiac defibrillator: Secondary | ICD-10-CM | POA: Diagnosis present

## 2013-02-26 DIAGNOSIS — I4729 Other ventricular tachycardia: Principal | ICD-10-CM

## 2013-02-26 DIAGNOSIS — J329 Chronic sinusitis, unspecified: Secondary | ICD-10-CM

## 2013-02-26 DIAGNOSIS — I255 Ischemic cardiomyopathy: Secondary | ICD-10-CM | POA: Diagnosis present

## 2013-02-26 DIAGNOSIS — I739 Peripheral vascular disease, unspecified: Secondary | ICD-10-CM | POA: Diagnosis present

## 2013-02-26 DIAGNOSIS — R635 Abnormal weight gain: Secondary | ICD-10-CM

## 2013-02-26 DIAGNOSIS — Z79899 Other long term (current) drug therapy: Secondary | ICD-10-CM

## 2013-02-26 DIAGNOSIS — I2589 Other forms of chronic ischemic heart disease: Secondary | ICD-10-CM | POA: Diagnosis present

## 2013-02-26 DIAGNOSIS — Z8249 Family history of ischemic heart disease and other diseases of the circulatory system: Secondary | ICD-10-CM

## 2013-02-26 DIAGNOSIS — Z9861 Coronary angioplasty status: Secondary | ICD-10-CM

## 2013-02-26 DIAGNOSIS — Z8674 Personal history of sudden cardiac arrest: Secondary | ICD-10-CM

## 2013-02-26 DIAGNOSIS — I4949 Other premature depolarization: Secondary | ICD-10-CM | POA: Diagnosis present

## 2013-02-26 HISTORY — DX: Abdominal aortic aneurysm, without rupture, unspecified: I71.40

## 2013-02-26 HISTORY — DX: Radiographic dye allergy status: Z91.041

## 2013-02-26 HISTORY — DX: Abdominal aortic aneurysm, without rupture: I71.4

## 2013-02-26 LAB — COMPREHENSIVE METABOLIC PANEL
ALT: 27 U/L (ref 0–53)
AST: 38 U/L — ABNORMAL HIGH (ref 0–37)
BUN: 18 mg/dL (ref 6–23)
GFR calc Af Amer: >90 mL/min (ref >90)
GFR calc non Af Amer: 87 mL/min — ABNORMAL LOW (ref >90)
Potassium: 4 mEq/L (ref 3.5–5.1)
Sodium: 136 mEq/L (ref 135–145)
Total Bilirubin: 0.2 mg/dL — ABNORMAL LOW (ref 0.3–1.2)

## 2013-02-26 LAB — CBC
MCV: 83 fL (ref 78.0–100.0)
Platelets: 116 10*3/uL — ABNORMAL LOW (ref 150–400)
RDW: 12.9 % (ref 11.5–15.5)
WBC: 6 10*3/uL (ref 4.0–10.5)

## 2013-02-26 LAB — CBC WITH DIFFERENTIAL/PLATELET
Basophils Relative: 0 % (ref 0–1)
Eosinophils Absolute: 0 10*3/uL (ref 0.0–0.7)
Eosinophils Relative: 1 % (ref 0–5)
MCH: 28.6 pg (ref 26.0–34.0)
MCHC: 34.1 g/dL (ref 30.0–36.0)
Monocytes Relative: 11 % (ref 3–12)
Neutrophils Relative %: 75 % (ref 43–77)
Platelets: 114 10*3/uL — ABNORMAL LOW (ref 150–400)

## 2013-02-26 LAB — CREATININE, SERUM: GFR calc Af Amer: >90 mL/min (ref >90)

## 2013-02-26 LAB — TROPONIN I: Troponin I: 1.4 ng/mL (ref <0.30)

## 2013-02-26 LAB — POCT I-STAT TROPONIN I: Troponin i, poc: 0.45 ng/mL (ref 0.00–0.08)

## 2013-02-26 MED ORDER — SODIUM CHLORIDE 0.9 % IJ SOLN
3.0000 mL | Freq: Two times a day (BID) | INTRAMUSCULAR | Status: DC
  Administered 2013-02-26 – 2013-02-28 (×2): 3 mL via INTRAVENOUS

## 2013-02-26 MED ORDER — OMEGA-3 FATTY ACIDS 1000 MG PO CAPS: 1.0000 g | ORAL_CAPSULE | Freq: Two times a day (BID) | ORAL | Status: DC

## 2013-02-26 MED ORDER — IBUPROFEN 400 MG PO TABS
400.0000 mg | ORAL_TABLET | Freq: Three times a day (TID) | ORAL | Status: DC | PRN
  Filled 2013-02-26: qty 1

## 2013-02-26 MED ORDER — ALPRAZOLAM 0.25 MG PO TABS: 0.2500 mg | ORAL_TABLET | Freq: Two times a day (BID) | ORAL | Status: DC | PRN

## 2013-02-26 MED ORDER — OMEGA-3-ACID ETHYL ESTERS 1 G PO CAPS
1.0000 g | ORAL_CAPSULE | Freq: Two times a day (BID) | ORAL | Status: DC
  Administered 2013-02-26 – 2013-03-01 (×6): 1 g via ORAL
  Filled 2013-02-26 (×8): qty 1

## 2013-02-26 MED ORDER — ASPIRIN 81 MG PO TABS
81.0000 mg | ORAL_TABLET | Freq: Every day | ORAL | Status: DC
  Administered 2013-02-27 (×2): 81 mg via ORAL
  Filled 2013-02-26 (×5): qty 1

## 2013-02-26 MED ORDER — ONDANSETRON HCL 4 MG/2ML IJ SOLN: 4.0000 mg | Freq: Four times a day (QID) | INTRAMUSCULAR | Status: DC | PRN

## 2013-02-26 MED ORDER — NIACIN ER (ANTIHYPERLIPIDEMIC) 500 MG PO TBCR
1000.0000 mg | EXTENDED_RELEASE_TABLET | Freq: Every day | ORAL | Status: DC
  Filled 2013-02-26: qty 2

## 2013-02-26 MED ORDER — LORATADINE 10 MG PO TABS
10.0000 mg | ORAL_TABLET | Freq: Every day | ORAL | Status: DC
  Administered 2013-02-27 – 2013-03-01 (×3): 10 mg via ORAL
  Filled 2013-02-26 (×4): qty 1

## 2013-02-26 MED ORDER — ACETAMINOPHEN 325 MG PO TABS: 650.0000 mg | ORAL_TABLET | ORAL | Status: DC | PRN

## 2013-02-26 MED ORDER — LISINOPRIL 2.5 MG PO TABS
2.5000 mg | ORAL_TABLET | Freq: Every day | ORAL | Status: DC
  Administered 2013-02-27: 2.5 mg via ORAL
  Filled 2013-02-26 (×4): qty 1

## 2013-02-26 MED ORDER — COLESEVELAM HCL 625 MG PO TABS
1875.0000 mg | ORAL_TABLET | Freq: Two times a day (BID) | ORAL | Status: DC
  Administered 2013-02-27 – 2013-03-01 (×4): 1875 mg via ORAL
  Filled 2013-02-26 (×8): qty 3

## 2013-02-26 MED ORDER — NITROGLYCERIN 0.2 MG/HR TD PT24
0.2000 mg | MEDICATED_PATCH | Freq: Every day | TRANSDERMAL | Status: DC
  Administered 2013-02-27 – 2013-03-01 (×3): 0.2 mg via TRANSDERMAL
  Filled 2013-02-26 (×4): qty 1

## 2013-02-26 MED ORDER — ADULT MULTIVITAMIN W/MINERALS CH
1.0000 | ORAL_TABLET | Freq: Every day | ORAL | Status: DC
  Administered 2013-02-27 – 2013-03-01 (×3): 1 via ORAL
  Filled 2013-02-26 (×4): qty 1

## 2013-02-26 MED ORDER — SODIUM CHLORIDE 0.9 % IV SOLN: 250.0000 mL | INTRAVENOUS | Status: DC | PRN

## 2013-02-26 MED ORDER — NITROGLYCERIN 0.4 MG SL SUBL: 0.4000 mg | SUBLINGUAL_TABLET | SUBLINGUAL | Status: DC | PRN

## 2013-02-26 MED ORDER — ATENOLOL 25 MG PO TABS
25.0000 mg | ORAL_TABLET | Freq: Two times a day (BID) | ORAL | Status: DC
  Administered 2013-02-26: 25 mg via ORAL
  Administered 2013-02-27 – 2013-02-28 (×3): 12.5 mg via ORAL
  Administered 2013-03-01: 10:00:00 25 mg via ORAL
  Filled 2013-02-26 (×8): qty 1

## 2013-02-26 MED ORDER — ZOLPIDEM TARTRATE 5 MG PO TABS: 5.0000 mg | ORAL_TABLET | Freq: Every evening | ORAL | Status: DC | PRN

## 2013-02-26 MED ORDER — ENOXAPARIN SODIUM 40 MG/0.4ML ~~LOC~~ SOLN
40.0000 mg | SUBCUTANEOUS | Status: DC
  Administered 2013-02-26 – 2013-02-28 (×3): 40 mg via SUBCUTANEOUS
  Filled 2013-02-26 (×5): qty 0.4

## 2013-02-26 MED ORDER — SODIUM CHLORIDE 0.9 % IJ SOLN
3.0000 mL | INTRAMUSCULAR | Status: DC | PRN
  Administered 2013-02-27: 3 mL via INTRAVENOUS

## 2013-02-26 MED ORDER — NIACIN ER 500 MG PO CPCR
1000.0000 mg | ORAL_CAPSULE | Freq: Every day | ORAL | Status: DC
  Administered 2013-02-27 – 2013-02-28 (×2): 1000 mg via ORAL
  Filled 2013-02-26 (×5): qty 2

## 2013-02-26 NOTE — ED Provider Notes (Signed)
History     CSN: [id number]  Arrival date & time 02/26/13  1536   First MD Initiated Contact with Patient 02/26/13 1540      Chief Complaint  Patient presents with  . Pacemaker Problem    pacemaker has been firing     (Consider location/radiation/quality/duration/timing/severity/associated sxs/prior treatment) HPI Comments: Patient states he was sitting in a DMV today when he felt mildly anxious and in his pacemaker fired x3. He denied any chest pain or shortness of breath during the event. Prior to the event he states he felt normal. Patient has a history of cardiac arrest requiring defibrillator. He states 4 years ago his defibrillator fired but otherwise has been doing well. His defibrillator was last checked in January without any changes of setting. He denies any recent medication changes or illness. He currently has no complaints.  The history is provided by the patient.    Past Medical History  Diagnosis Date  . Coronary artery disease     myoview 2011>>EF of 53% / large area of old inferolateral infarct but no reversible ischemia          . Dyslipidemia   . Cardiac arrest 03/29/2008    at home without resuscitation anywhere from 7-10 minutes before EMS arrived/ The patient was noted to be in ventricular fibrillation  required defibrillation x3               . Hypercoagulable state     Questionable history of a borderline hypercoagulable state  . Hyperlipidemia   . Anoxic brain damage      resolved  . Respiratory failure     Respiratory failure and pulmonary collapse requiring intubation  . 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 PVCs (premature ventricular contractions)     occasionally  . Peripheral vascular disease   . Myocardial infarction     Past Surgical History  Procedure Laterality Date  . Cholecystectomy, laparoscopic  08/13/2009    Calculous cholecystitis  . Insert / replace / remove pacemaker  04/07/2008      cardioverter  defibrillator, [employer] Single-chamber defibrillator implantation with intraoperative defibrillation threshold testing /  Durata 7121 dual-coil active defibrillator lead, serial  #[id number]   . Cardiac catheterization   04/04/2008     EF is around 35%./ Coronary artery disease primarily involving the LAD in the  circumflex artery.  The LAD stent is widely patent and this vessel appears to be very nice  . Coronary angioplasty with stent placement  1999    PTCA and stenting of his left anterior descending artery  . Cholecystectomy      Family History  Problem Relation Age of Onset  . Coronary artery disease      strong family history of   . Hyperlipidemia Mother   . Heart disease Father     before age 6  . Hyperlipidemia Father     History  Substance Use Topics  . Smoking status: Former Smoker -- 1.00 packs/day for 20 years    Types: Cigarettes    Quit date: 12/23/1979  . Smokeless tobacco: Never Used  . Alcohol Use: No      Review of Systems  All other systems reviewed and are negative.    Allergies  Losartan potassium; Clopidogrel bisulfate; Iodine; Metoprolol; and Statins  Home Medications   Current Outpatient Rx  Name  Route  Sig  Dispense  Refill  . aspirin 81 MG tablet   Oral   Take  81 mg by mouth daily.           Marland Kitchen atenolol (TENORMIN) 25 MG tablet      Take 1/2 tablet by mouth twice daily.   90 tablet   3   . Cholecalciferol (VITAMIN D-3 PO)   Oral   Take 300 mg by mouth 2 (two) times daily.           . colesevelam (WELCHOL) 625 MG tablet   Oral   Take 3 tablets (1,875 mg total) by mouth 2 (two) times daily with a meal.   540 tablet   3   . fluticasone (FLONASE) 50 MCG/ACT nasal spray   Nasal   Place 2 sprays into the nose daily. As needed         . GLUCOSAMINE PO   Oral   Take by mouth. Taking DS daily         . guaiFENesin (MUCINEX) 600 MG 12 hr tablet   Oral   Take 600 mg by mouth 2 (two) times daily as needed.         .  IBUPROFEN PO   Oral   Take 1-2 tablets by mouth as needed.           Marland Kitchen lisinopril (PRINIVIL,ZESTRIL) 2.5 MG tablet   Oral   Take 1 tablet (2.5 mg total) by mouth daily.   90 tablet   4   . loratadine (CLARITIN) 10 MG tablet   Oral   Take 10 mg by mouth daily.         . multivitamin (THERAGRAN) per tablet   Oral   Take 1 tablet by mouth daily.           . niacin (NIASPAN) 1000 MG CR tablet   Oral   Take 1 tablet (1,000 mg total) by mouth at bedtime.   90 tablet   3     GENERIC OK   . nitroGLYCERIN (NITRODUR - DOSED IN MG/24 HR) 0.2 mg/hr   Transdermal   Place 1 patch (0.2 mg total) onto the skin daily.   90 patch   3   . nitroGLYCERIN (NITROSTAT) 0.4 MG SL tablet   Sublingual   Place 1 tablet (0.4 mg total) under the tongue every 5 (five) minutes as needed.   25 tablet   3     Dr. Patty Sermons intends for pt to get 4 bottles of 2 ...   . Omega-3 Fatty Acids (FISH OIL PO)   Oral   Take 1 capsule by mouth 2 (two) times daily.             BP 141/70  Pulse 80  Temp(Src) 98.4 F (36.9 C) (Oral)  Resp 14  SpO2 98%  Physical Exam  Nursing note and vitals reviewed. Constitutional: He is oriented to person, place, and time. He appears well-developed and well-nourished. No distress.  HENT:  Head: Normocephalic and atraumatic.  Mouth/Throat: Oropharynx is clear and moist.  Eyes: Conjunctivae and EOM are normal. Pupils are equal, round, and reactive to light.  Neck: Normal range of motion. Neck supple.  Cardiovascular: Normal rate, regular rhythm and intact distal pulses.   No murmur heard. Pulmonary/Chest: Effort normal and breath sounds normal. No respiratory distress. He has no wheezes. He has no rales.  Pacemaker present in left upper chest  Abdominal: Soft. He exhibits no distension. There is no tenderness. There is no rebound and no guarding.  Musculoskeletal: Normal range of motion. He exhibits no edema and  no tenderness.  Neurological: He is alert  and oriented to person, place, and time.  Skin: Skin is warm and dry. No rash noted. No erythema.  Psychiatric: He has a normal mood and affect. His behavior is normal.    ED Course  Procedures (including critical care time)  Labs Reviewed  CBC WITH DIFFERENTIAL - Abnormal; Notable for the following:    Platelets 114 (*)    All other components within normal limits  COMPREHENSIVE METABOLIC PANEL - Abnormal; Notable for the following:    Glucose, Bld 113 (*)    AST 38 (*)    Total Bilirubin 0.2 (*)    GFR calc non Af Amer 87 (*)    All other components within normal limits  POCT I-STAT TROPONIN I - Abnormal; Notable for the following:    Troponin i, poc 0.45 (*)    All other components within normal limits  MAGNESIUM   Dg Chest 2 View  02/26/2013  *RADIOLOGY REPORT*  Clinical Data: Defibrillator discharge  CHEST - 2 VIEW  Comparison: 08/10/2009  Findings: AICD is unchanged from the prior study.  Mild vascular congestion without edema or effusion.  Negative for pneumonia.  IMPRESSION: Mild vascular congestion without overt heart failure.  Lungs are otherwise clear.   Original Report Authenticated By: Janeece Riggers, M.D.      Date: 02/26/2013  Rate: 78  Rhythm: normal sinus rhythm  QRS Axis: normal  Intervals: normal  ST/T Wave abnormalities: normal  Conduction Disutrbances:first-degree A-V block   Narrative Interpretation:   Old EKG Reviewed: unchanged    No diagnosis found.    MDM   Because his defibrillator fired x3 today while he was sitting in a DMV. He states he felt anxious and it went off 3 times within 5 minutes. When EMS arrived they said they were pretty sure patient had a 3 second run of V. tach. Patient does have a history of cardiac arrest in 2009 for which the defibrillator was placed. The only time his defibrillator went off other than that was 4 years ago. He was feeling fine today prior to the event and has no complaints now. Patient denies any recent  medication changes or illness. He is well-appearing and has no focal findings on exam [employer] called to the interrogate pacemaker. CBC, CMP, magnesium pending. Chest x-ray and EKG  5:08 PM Troponin is elevated. The rest of labs are within normal limits and chest x-ray is unchanged. [employer] called about pacemaker interrogation and patient had 2 runs of ventricular tachycardia greater than 200 beats per minute one terminated spontaneously and one was defibrillated. Secondly patient had 2 runs of ventricular fibrillation requiring 2 separate episodes of defibrillation. This was discussed with cardiology and they will evaluate the patient      Gwyneth Sprout, MD 02/26/13 1709

## 2013-02-26 NOTE — ED Notes (Signed)
Cardiology at bedside.

## 2013-02-26 NOTE — ED Notes (Signed)
Attempted to call report to [phone]. Was unable to get in touch with nurse, will call back in .

## 2013-02-26 NOTE — Progress Notes (Signed)
Dr Carolanne Grumbling notified of Trop I 1.40 called from lab.  Pt remains CP free.  Will continue to monitor
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
## 2013-02-26 NOTE — Consult Note (Signed)
CARDIOLOGY CONSULT NOTE   Patient ID: Joel Lin MRN: [id number] DOB/AGE: July 22, 1941 72 y.o.  Admit date: 02/26/2013  Primary Physician   Joel Apo, MD Primary Cardiologist   Joel Lin   Defib d/c  Joel Lin is a 72 y.o. male with a history of CAD and remote VF arrest ventricular tachycardia/ventricular fibrillation and ICD discharges. The patient has a history of coronary disease as outlined in the past medical history. The patient has not had recent dyspnea on exertion, orthopnea, PND, pedal edema, palpitations, syncope or exertional chest pain. Today while driving he developed a feeling of anxiety. There was no chest pain, dyspnea or palpitations and no syncope. His ICD fired twice. He pulled to the side of the road. He then began driving home and had a third episode. He now presents to the emergency room and states he feels fine. Last echocardiogram in January of 2014 showed an ejection fraction of 40% with akinesis of the posterior wall and basal to mid anterolateral wall. There was grade 1 diastolic dysfunction. There was mild left atrial enlargement. There was trace mitral regurgitation. Last nuclear study in October 2012 showed an ejection fraction of 45%. There was prior inferior lateral and lateral scar but no ischemia.   Past Medical History  Diagnosis Date  . Coronary artery disease     myoview 2011>>EF of 53% / large area of old inferolateral infarct but no reversible ischemia          . Dyslipidemia   . Cardiac arrest 03/29/2008    at home without resuscitation anywhere from 7-10 minutes before EMS arrived/ The patient was noted to be in ventricular fibrillation  required defibrillation x3               . Hypercoagulable state     Questionable history of a borderline hypercoagulable state  . Hyperlipidemia   . Anoxic brain damage      resolved  . Respiratory failure 2009    Respiratory failure and pulmonary collapse requiring  intubation  . 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 PVCs (premature ventricular contractions)     occasionally  . Peripheral vascular disease   . Myocardial infarction    Past Surgical History  Procedure Laterality Date  . Cholecystectomy, laparoscopic  08/13/2009    Calculous cholecystitis  . Insert / replace / remove pacemaker  04/07/2008      cardioverter defibrillator, St. Jude Single-chamber defibrillator implantation with intraoperative defibrillation threshold testing /  Durata 7121 dual-coil active defibrillator lead, serial  #[id number]   . Cardiac catheterization  04/04/2008     LAD stent patent, D1 OK, OM1 90/80/80% (2mm vessel), RCA 30%; EF 35%.  . Coronary angioplasty with stent placement  1999    PTCA and stenting of his left anterior descending artery  . Cholecystectomy      Allergies  Allergen Reactions  . Losartan Potassium Palpitations  . Clopidogrel Bisulfate Hives  . Iodine Hives    "Renografin"  . Metoprolol Other (See Comments)    Unknown   . Statins Other (See Comments)    Muscle cramps      ibuprofen  Prior to Admission medications   Medication Sig Start Date End Date Taking? Authorizing Provider  aspirin 325 MG tablet Take 325 mg by mouth daily.   Yes Historical Provider, MD  aspirin 81 MG tablet Take 81 mg by mouth daily.     Yes Historical Provider, MD  atenolol (  TENORMIN) 25 MG tablet Take 12.5 mg by mouth 2 (two) times daily.   Yes Historical Provider, MD  Cholecalciferol (VITAMIN D-3 PO) Take 300 mg by mouth 2 (two) times daily.     Yes Historical Provider, MD  colesevelam (WELCHOL) 625 MG tablet Take 3 tablets (1,875 mg total) by mouth 2 (two) times daily with a meal. 05/28/12  Yes Joel Clement, MD  fish oil-omega-3 fatty acids 1000 MG capsule Take 1 g by mouth 2 (two) times daily.   Yes Historical Provider, MD  Glucosamine Sulfate-MSM (GLUCOSAMINE-MSM DS PO) Take 1 tablet by mouth daily.   Yes Historical  Provider, MD  ibuprofen (ADVIL,MOTRIN) 200 MG tablet Take 400 mg by mouth every 8 (eight) hours as needed for pain.   Yes Historical Provider, MD  lisinopril (PRINIVIL,ZESTRIL) 2.5 MG tablet Take 1 tablet (2.5 mg total) by mouth daily. 01/07/13  Yes Joel Salvia, MD  loratadine (CLARITIN) 10 MG tablet Take 10 mg by mouth daily.   Yes Historical Provider, MD  Multiple Vitamin (MULTIVITAMIN WITH MINERALS) TABS Take 1 tablet by mouth daily.   Yes Historical Provider, MD  niacin (NIASPAN) 1000 MG CR tablet Take 1 tablet (1,000 mg total) by mouth at bedtime. 11/06/12  Yes Joel Clement, MD  nitroGLYCERIN (NITRODUR - DOSED IN MG/24 HR) 0.2 mg/hr Place 1 patch (0.2 mg total) onto the skin daily. 07/02/12  Yes Joel Clement, MD  nitroGLYCERIN (NITROSTAT) 0.4 MG SL tablet Place 1 tablet (0.4 mg total) under the tongue every 5 (five) minutes as needed. 05/28/12   Joel Clement, MD     History   Social History  . Marital Status: Married    Spouse Name: N/A    Number of Children: N/A  . Years of Education: N/A   Occupational History  . Retired Runner, broadcasting/film/video    Social History Main Topics  . Smoking status: Former Smoker -- 1.00 packs/day for 20 years    Types: Cigarettes    Quit date: 12/23/1979  . Smokeless tobacco: Never Used  . Alcohol Use: No  . Drug Use: No  . Sexually Active: Not on file   Other Topics Concern  . Not on file   Social History Narrative  . No narrative on file    No family status information on file.   Family History  Problem Relation Age of Onset  . Coronary artery disease      strong family history of   . Hyperlipidemia Mother   . Heart disease Father     before age 24  . Hyperlipidemia Father      ROS:  Full 14 point review of systems complete and found to be negative unless listed above.  Physical Exam: Blood pressure 141/70, pulse 80, temperature 98.4 F (36.9 C), temperature source Oral, resp. rate 14, SpO2 98.00%.  General: Well developed, well  nourished, male in no acute distress Head: Eyes PERRLA, No xanthomas.   Normocephalic and atraumatic, oropharynx without edema or exudate.  Neck: supple Lungs: CTA Heart: HRRR S1 S2, no rub/gallop, Heart irregular rate and rhythm with S1, S2  murmur. pulses are 2+ extrem.   Neck: No carotid bruits. No lymphadenopathy.  JVD. Abdomen: Bowel sounds present, abdomen soft and non-tender without masses or hernias noted. Msk:  No spine or cva tenderness. No weakness, no joint deformities or effusions. Extremities: No clubbing or cyanosis.  edema.  Neuro: Alert and oriented X 3. No focal deficits noted. Psych:  Good affect, responds appropriately Skin: No rashes  or lesions noted.  Labs:   Lab Results  Component Value Date   WBC 7.1 02/26/2013   HGB 15.5 02/26/2013   HCT 45.5 02/26/2013   MCV 83.9 02/26/2013   PLT 114* 02/26/2013    Recent Labs Lab 02/26/13 1602  NA 136  K 4.0  CL 101  CO2 26  BUN 18  CREATININE 0.84  CALCIUM 9.4  PROT 7.3  BILITOT 0.2*  ALKPHOS 62  ALT 27  AST 38*  GLUCOSE 113*   Magnesium  Date Value Range Status  02/26/2013 2.2  1.5 - 2.5 mg/dL Final    Recent Labs  16/10/96 1631  TROPIPOC 0.45*   Echo: 12/31/2012 Conclusions Left ventricle: The cavity size was normal. Wall thickness was normal. The estimated ejection fraction was 40%. Akinetic posterior wall and basal to mid anterolateral wall. Doppler parameters are consistent with abnormal left ventricular relaxation (grade 1 diastolic dysfunction). - Aortic valve: There was no stenosis. - Mitral valve: Trivial regurgitation. - Left atrium: The atrium was mildly dilated. - Right ventricle: The cavity size was normal. Pacer wire or catheter noted in right ventricle. Systolic function was normal. - Tricuspid valve: Peak RV-RA gradient: 20mm Hg (S). - Pulmonary arteries: PA peak pressure: 25mm Hg (S). - Inferior vena cava: The vessel was normal in size; the respirophasic diameter changes were in  the normal range (= 50%); findings are consistent with normal central venous pressure. Impressions: - Normal LV size with EF 40%. Akinetic posterior wall and akinetic basal to mid anterolateral wall. Normal RV size and systolic function. No significant valvular abnormalities.  ECG:  Sinus rhythm, first degree AV block, inferior lateral infarct.  Radiology:  Dg Chest 2 View 02/26/2013  *RADIOLOGY REPORT*  Clinical Data: Defibrillator discharge  CHEST - 2 VIEW  Comparison: 08/10/2009  Findings: AICD is unchanged from the prior study.  Mild vascular congestion without edema or effusion.  Negative for pneumonia.  IMPRESSION: Mild vascular congestion without overt heart failure.  Lungs are otherwise clear.   Original Report Authenticated By: Janeece Riggers, M.D.     ASSESSMENT AND PLAN:   1 ventricular tachycardia/ventricular fibrillation-the patient presents with 3 episodes of ventricular tachycardia/fibrillation and ICD discharges x3. Potassium and magnesium are normal. Plan to admit and observe on telemetry. Initial troponin is minimally elevated most likely from ICD discharge. Continue to cycle enzymes. If no further trend up I would favor a nuclear study to screen for coronary disease as a contributor to his arrhythmia. I will increase his atenolol to 25 mg by mouth twice a day. If he has more frequent or recurrent episodes in the future he may require an antiarrhythmic such as amiodarone. 2 coronary artery disease-continue aspirin and beta blocker. He would benefit from a statin but apparently has had difficulties with statins previously. He will review this with Dr. Patty Sermons following discharge. 3 hypertension-continue present medications.  Signed: Olga Millers, MD 02/26/2013, 6:01 PM

## 2013-02-26 NOTE — ED Notes (Signed)
Pt states he came because he was concerned when his defibrillator went off. Pt denies any cp, sob, or any other sx.

## 2013-02-26 NOTE — ED Notes (Signed)
Pt left chest defibrillator interrogated and transmitted

## 2013-02-27 DIAGNOSIS — I472 Ventricular tachycardia: Principal | ICD-10-CM

## 2013-02-27 DIAGNOSIS — I4729 Other ventricular tachycardia: Principal | ICD-10-CM

## 2013-02-27 LAB — COMPREHENSIVE METABOLIC PANEL
AST: 36 U/L (ref 0–37)
BUN: 14 mg/dL (ref 6–23)
CO2: 27 mEq/L (ref 19–32)
Calcium: 9 mg/dL (ref 8.4–10.5)
Creatinine, Ser: 0.94 mg/dL (ref 0.50–1.35)
GFR calc Af Amer: >90 mL/min (ref >90)
GFR calc non Af Amer: 83 mL/min — ABNORMAL LOW (ref >90)

## 2013-02-27 LAB — TROPONIN I: Troponin I: 1.67 ng/mL (ref <0.30)

## 2013-02-27 LAB — TSH: TSH: 1.571 u[IU]/mL (ref 0.350–4.500)

## 2013-02-27 MED ORDER — SODIUM CHLORIDE 0.9 % IV SOLN
1.0000 mL/kg/h | INTRAVENOUS | Status: DC
  Administered 2013-02-28: 1 mL/kg/h via INTRAVENOUS

## 2013-02-27 MED ORDER — ASPIRIN 81 MG PO CHEW
324.0000 mg | CHEWABLE_TABLET | ORAL | Status: AC
  Administered 2013-02-28: 324 mg via ORAL
  Filled 2013-02-27: qty 4

## 2013-02-27 MED ORDER — DIAZEPAM 5 MG PO TABS
5.0000 mg | ORAL_TABLET | ORAL | Status: AC
  Filled 2013-02-27: qty 1

## 2013-02-27 MED ORDER — ALPRAZOLAM 0.25 MG PO TABS
0.2500 mg | ORAL_TABLET | Freq: Every evening | ORAL | Status: DC | PRN
  Administered 2013-02-27 – 2013-02-28 (×2): 0.25 mg via ORAL
  Filled 2013-02-27 (×2): qty 1

## 2013-02-27 NOTE — H&P (Signed)
Please refer to consult note 3/18 which should be labeled H and P. Joel Lin

## 2013-02-27 NOTE — Care Management Note (Signed)
    Page 1 of 1   02/27/2013     8:34:19 AM   CARE MANAGEMENT NOTE 02/27/2013  Patient:  CORI, HENNINGSEN   Account Number:  [id number]  Date Initiated:  02/27/2013  Documentation initiated by:  Junius Creamer  Subjective/Objective Assessment:   adm w icd shocks x3     Action/Plan:   lives w wife, pcp dr Windy Fast polite   Anticipated DC Date:     Anticipated DC Plan:  HOME/SELF CARE      DC Planning Services  CM consult      Choice offered to / List presented to:             Status of service:   Medicare Important Message given?   (If response is "NO", the following Medicare IM given date fields will be blank) Date Medicare IM given:   Date Additional Medicare IM given:    Discharge Disposition:  HOME/SELF CARE  Per UR Regulation:  Reviewed for med. necessity/level of care/duration of stay  If discussed at Long Length of Stay Meetings, dates discussed:    Comments:

## 2013-02-27 NOTE — Progress Notes (Signed)
Patient: Joel Lin Date of Encounter: 02/27/2013, 1:52 PM Admit date: 02/26/2013     Subjective  Joel Lin is feeling back to his usual self, stating "I feel great!" and is without complaint. He denies CP, SOB, palpitations or syncope. He reports feeling "anxious" prior to his ICD shocks yesterday but was not experiencing any other symptoms. He also states he felt "fine" following his shocks. He denies any worsening HF symptoms. He denies any recent changes to his medications and reports compliance.   Objective  Physical Exam: Vitals: BP 137/70  Pulse 58  Temp(Src) 98.8 F (37.1 C) (Oral)  Resp 12  Ht 5' 8.5" (1.74 m)  Wt 173 lb 1 oz (78.5 kg)  BMI 25.93 kg/m2  SpO2 94% General: Well developed, well appearing 72 year old male in no acute distress. Neck: Supple. JVD not elevated. Lungs: Clear bilaterally to auscultation without wheezes, rales, or rhonchi. Breathing is unlabored. Heart: RRR S1 S2 without murmur, rub or gallop.  Abdomen: Soft, non-distended. Extremities: No clubbing or cyanosis. No edema.  Distal pedal pulses are 2+ and equal bilaterally. Neuro: Alert and oriented X 3. Moves all extremities spontaneously. No focal deficits.  Intake/Output:  Intake/Output Summary (Last 24 hours) at 02/27/13 1352 Last data filed at 02/27/13 1216  Gross per 24 hour  Intake    840 ml  Output   1875 ml  Net  -1035 ml    Inpatient Medications:  . aspirin  81 mg Oral Daily  . atenolol  25 mg Oral BID  . colesevelam  1,875 mg Oral BID WC  . enoxaparin (LOVENOX) injection  40 mg Subcutaneous Q24H  . lisinopril  2.5 mg Oral Daily  . loratadine  10 mg Oral Daily  . multivitamin with minerals  1 tablet Oral Daily  . niacin  1,000 mg Oral QHS  . nitroGLYCERIN  0.2 mg Transdermal Daily  . omega-3 acid ethyl esters  1 g Oral BID  . sodium chloride  3 mL Intravenous Q12H    Labs:  Recent Labs  02/26/13 1602 02/26/13 2119 02/27/13 0252  NA 136  --  140  K 4.0   --  3.6  CL 101  --  104  CO2 26  --  27  GLUCOSE 113*  --  97  BUN 18  --  14  CREATININE 0.84 0.80 0.94  CALCIUM 9.4  --  9.0  MG 2.2  --   --     Recent Labs  02/26/13 1602 02/27/13 0252  AST 38* 36  ALT 27 23  ALKPHOS 62 56  BILITOT 0.2* 0.4  PROT 7.3 6.4  ALBUMIN 3.8 3.4*    Recent Labs  02/26/13 1602 02/26/13 2119  WBC 7.1 6.0  NEUTROABS 5.3  --   HGB 15.5 14.5  HCT 45.5 41.6  MCV 83.9 83.0  PLT 114* 116*    Recent Labs  02/26/13 2119 02/27/13 0252 02/27/13 0925  TROPONINI 1.40* 1.67* 0.81*    Recent Labs  02/26/13 2119  TSH 1.571    Radiology/Studies: Dg Chest 2 View  02/26/2013  *RADIOLOGY REPORT*  Clinical Data: Defibrillator discharge  CHEST - 2 VIEW  Comparison: 08/10/2009  Findings: AICD is unchanged from the prior study.  Mild vascular congestion without edema or effusion.  Negative for pneumonia.  IMPRESSION: Mild vascular congestion without overt heart failure.  Lungs are otherwise clear.   Original Report Authenticated By: Janeece Riggers, M.D.    Last echocardiogram in January  2014 showed an ejection fraction of 40% with akinesis of the posterior wall and basal to mid anterolateral wall, grade 1 diastolic dysfunction, mild left atrial enlargement, trace mitral regurgitation  Last nuclear study in October 2012 showed an ejection fraction of 45%, prior inferolateral and lateral scar but no ischemia  Telemetry: sinus rhythm with occasional multifocal PVCs, ventricular couplets  Device interrogation: normal single chamber ICD function, good battery status, stable lead measurements; on 02/26/2013 - 3 episodes - 1st episode (1:15 PM) starts as monomorphic VT CL ~265 msec, given ATP x1 which is unsuccessful then VT morphology changes, given 30J shock x1 which is unsuccessful, VT degenerates into VF which is successfully terminated with another 30J shock; 2nd episode (1:18 PM) is monomorphic VT CL ~234 msec, rate in the VF zone, successfully terminated  with 30J shock x1; 3rd episode (1:20 PM) is monomorphic VT CL ~265 msec, successfully terminated with ATP x1    Assessment and Plan  1. VT s/p ICD shock - Mg 2.2, K 4.0 on admission; device interrogation as described above 2. History of VF arrest 2009; has had NSVT seen on prior ICD interrogations 3. Ischemic CM, EF 40% by recent echo 4. CAD - troponin mildly elevated on admission but trended down; likely related to ICD shock  5. History of statin intolerance  Joel Lin is stable. He wishes to continue his current medical regimen as he has symptoms of fatigue with higher doses of BB/atenolol. ? need for AAD - Dr. Graciela Husbands to advise. Dr. Jens Som mentioned if troponin trended down, a nuclear stress test is reasonable for risk stratification/determination if CAD progression could be cause for VT.   Dr. Graciela Husbands to see Signed, Rick Duff PA-C  Hx of VT in Sept Rx ATP now w VT storm in context of remotely revascularized CAD  I think he needs to be cathed and will consider AAD but bradycardia and subsequent PVCs have limited uptitration of betablockers  revieweed with pt and family He will be restricted from driving

## 2013-02-28 ENCOUNTER — Encounter (HOSPITAL_COMMUNITY): Payer: Self-pay | Admitting: Internal Medicine

## 2013-02-28 ENCOUNTER — Encounter (HOSPITAL_COMMUNITY)
Admission: EM | Disposition: A | Discharge status: Home / Self Care | Payer: Self-pay | Source: Home / Self Care | Attending: Cardiology

## 2013-02-28 DIAGNOSIS — I2510 Atherosclerotic heart disease of native coronary artery without angina pectoris: Secondary | ICD-10-CM

## 2013-02-28 HISTORY — PX: LEFT HEART CATHETERIZATION WITH CORONARY ANGIOGRAM: SHX5451

## 2013-02-28 LAB — PROTIME-INR
INR: 1.04 (ref 0.00–1.49)
Prothrombin Time: 13.5 seconds (ref 11.6–15.2)

## 2013-02-28 SURGERY — LEFT HEART CATHETERIZATION WITH CORONARY ANGIOGRAM
Anesthesia: LOCAL

## 2013-02-28 MED ORDER — MIDAZOLAM HCL 2 MG/2ML IJ SOLN
INTRAMUSCULAR | Status: AC
  Filled 2013-02-28: qty 2

## 2013-02-28 MED ORDER — VERAPAMIL HCL 2.5 MG/ML IV SOLN
INTRAVENOUS | Status: AC
  Filled 2013-02-28: qty 2

## 2013-02-28 MED ORDER — SODIUM CHLORIDE 0.9 % IV SOLN: 250.0000 mL | INTRAVENOUS | Status: DC | PRN

## 2013-02-28 MED ORDER — SODIUM CHLORIDE 0.9 % IV SOLN
1.0000 mL/kg/h | INTRAVENOUS | Status: AC
  Administered 2013-02-28: 1 mL/kg/h via INTRAVENOUS

## 2013-02-28 MED ORDER — SODIUM CHLORIDE 0.9 % IJ SOLN: 3.0000 mL | Freq: Two times a day (BID) | INTRAMUSCULAR | Status: DC

## 2013-02-28 MED ORDER — HEPARIN (PORCINE) IN NACL 2-0.9 UNIT/ML-% IJ SOLN
INTRAMUSCULAR | Status: AC
  Filled 2013-02-28: qty 1000

## 2013-02-28 MED ORDER — DIPHENHYDRAMINE HCL 50 MG/ML IJ SOLN
25.0000 mg | INTRAMUSCULAR | Status: AC
  Administered 2013-02-28: 15:00:00 via INTRAVENOUS
  Filled 2013-02-28: qty 1

## 2013-02-28 MED ORDER — LIDOCAINE HCL (PF) 1 % IJ SOLN
INTRAMUSCULAR | Status: AC
  Filled 2013-02-28: qty 30

## 2013-02-28 MED ORDER — DIPHENHYDRAMINE HCL 50 MG/ML IJ SOLN: 25.0000 mg | INTRAMUSCULAR | Status: DC

## 2013-02-28 MED ORDER — METHYLPREDNISOLONE SODIUM SUCC 125 MG IJ SOLR: 125.0000 mg | INTRAMUSCULAR | Status: DC

## 2013-02-28 MED ORDER — ASPIRIN 81 MG PO CHEW
324.0000 mg | CHEWABLE_TABLET | ORAL | Status: AC
  Administered 2013-02-28: 324 mg via ORAL
  Filled 2013-02-28: qty 4

## 2013-02-28 MED ORDER — METHYLPREDNISOLONE SODIUM SUCC 125 MG IJ SOLR
125.0000 mg | INTRAMUSCULAR | Status: AC
  Administered 2013-02-28: 125 mg via INTRAVENOUS
  Filled 2013-02-28: qty 2
  Filled 2013-02-28: qty 4
  Filled 2013-02-28: qty 2

## 2013-02-28 MED ORDER — FENTANYL CITRATE 0.05 MG/ML IJ SOLN
INTRAMUSCULAR | Status: AC
  Filled 2013-02-28: qty 2

## 2013-02-28 MED ORDER — DIAZEPAM 5 MG PO TABS
5.0000 mg | ORAL_TABLET | ORAL | Status: AC
  Administered 2013-02-28: 5 mg via ORAL

## 2013-02-28 MED ORDER — SODIUM CHLORIDE 0.9 % IJ SOLN: 3.0000 mL | INTRAMUSCULAR | Status: DC | PRN

## 2013-02-28 MED ORDER — FAMOTIDINE 20 MG PO TABS: 20.0000 mg | ORAL_TABLET | ORAL | Status: DC

## 2013-02-28 MED ORDER — SODIUM CHLORIDE 0.9 % IV SOLN: 1.0000 mL/kg/h | INTRAVENOUS | Status: DC

## 2013-02-28 MED ORDER — FAMOTIDINE IN NACL 20-0.9 MG/50ML-% IV SOLN
20.0000 mg | INTRAVENOUS | Status: AC
  Administered 2013-02-28: 20 mg via INTRAVENOUS
  Filled 2013-02-28: qty 50

## 2013-02-28 NOTE — CV Procedure (Signed)
   Cardiac Catheterization Procedure Note  Name: Joel Lin MRN: [id number] DOB: 07/27/1941  Procedure: Left Heart Cath, Selective Coronary Angiography, LV angiography  Indication: Ventricular tachycardia with 3 ICD shocks. Elevated troponin in that context.   Procedural Details: The right wrist was prepped, draped, and anesthetized with 1% lidocaine. Using the modified Seldinger technique, a 5 French sheath was introduced into the right radial artery. 3 mg of verapamil was administered through the sheath, weight-based unfractionated heparin was administered intravenously. Standard Judkins catheters were used for selective coronary angiography and left ventriculography. Catheter exchanges were performed over an exchange length guidewire. There were no immediate procedural complications. A TR band was used for radial hemostasis at the completion of the procedure.  The patient was transferred to the post catheterization recovery area for further monitoring.  Procedural Findings: Hemodynamics: AO 95/6 LV 92/48 with a mean of 66  Coronary angiography: Coronary dominance: right  Left mainstem: There is mild stenosis in the mid shaft of the left main. This is estimated at 30-40%. The left main divides into the LAD and left circumflex the  Left anterior descending (LAD): The LAD is a large caliber vessel. There is a patent stent in the mid vessel. The proximal vessel is widely patent. Just at the level of the first septal perforator, there is a 30-40% stenosis. The first diagonal is small. The second diagonal is large in caliber. There is a 50% ostial lesion of the second diagonal. The mid and distal LAD are large in caliber without significant stenosis.  Left circumflex (LCx): The left circumflex is patent with no significant proximal vessel stenosis. The first obtuse marginal has 95% stenosis. This is unchanged from the previous study. This is a small caliber vessel. A subbranch of that  vessel has an 80% stenosis. The subbranch is even smaller.  Right coronary artery (RCA): The RCA is dominant. The vessel is patent throughout its course. The distal vessel has 50% stenosis. There is mild diffuse calcification of the RCA. The proximal vessel has 20-30% stenosis. The mid vessel has 20-30% stenosis. The PDA and PLA branches are patent.  Left ventriculography: There is dense akinesis of the inferoapical wall. The other segments of the left ventricle contract normally. The estimated left ventricular ejection fraction is 40%  Final Conclusions:   1. Continued patency of the stented segment in the LAD with mild nonobstructive disease 2. Severe stenosis in the obtuse marginal branch left circumflex, stable from 2009 study 3. Nonobstructive stenosis of the right coronary artery 4. Moderate segmental left ventricular systolic dysfunction  Recommendations: Continue medical therapy for ischemic cardiomyopathy and CAD. The patient's coronary anatomy is unchanged from 2009.  Tonny Bollman 02/28/2013, 4:06 PM

## 2013-02-28 NOTE — Interval H&P Note (Signed)
History and Physical Interval Note:  02/28/2013 3:26 PM  Joel Lin  has presented today for surgery, with the diagnosis of cp  The various methods of treatment have been discussed with the patient and family. After consideration of risks, benefits and other options for treatment, the patient has consented to  Procedure(s): LEFT HEART CATHETERIZATION WITH CORONARY ANGIOGRAM (N/A) as a surgical intervention .  The patient's history has been reviewed, patient examined, no change in status, stable for surgery.  I have reviewed the patient's chart and labs.  Questions were answered to the patient's satisfaction.     Tonny Bollman

## 2013-02-28 NOTE — Progress Notes (Signed)
TR BAND REMOVAL  LOCATION:    right radial  DEFLATED PER PROTOCOL:    yes  TIME BAND OFF / DRESSING APPLIED:    1900   SITE UPON ARRIVAL:    Level 0  SITE AFTER BAND REMOVAL:    Level 0  REVERSE ALLEN'S TEST:     positive  CIRCULATION SENSATION AND MOVEMENT:    Within Normal Limits   yes  COMMENTS:   Tolerated procedure well  

## 2013-02-28 NOTE — H&P (View-Only) (Signed)
Patient: Joel Lin Date of Encounter: 02/28/2013, 8:18 AM Admit date: 02/26/2013     Subjective  Without sob shest pain  .   Objective  Physical Exam: Vitals: BP 106/64  Pulse 58  Temp(Src) 98.2 F (36.8 C) (Oral)  Resp 12  Ht 5' 8.5" (1.74 m)  Wt 174 lb 2.6 oz (79 kg)  BMI 26.09 kg/m2  SpO2 94% Well developed and nourished in no acute distress HENT normal Neck supple with JVP-flat Clear Regular rate and rhythm, no murmurs or gallops Abd-soft with active BS No Clubbing cyanosis edema Skin-warm and dry A & Oriented  Grossly normal sensory and motor function  Intake/Output:  Intake/Output Summary (Last 24 hours) at 02/28/13 0818 Last data filed at 02/28/13 0406  Gross per 24 hour  Intake    600 ml  Output    800 ml  Net   -200 ml    Inpatient Medications:  . aspirin  81 mg Oral Daily  . atenolol  25 mg Oral BID  . colesevelam  1,875 mg Oral BID WC  . diazepam  5 mg Oral On Call  . enoxaparin (LOVENOX) injection  40 mg Subcutaneous Q24H  . lisinopril  2.5 mg Oral Daily  . loratadine  10 mg Oral Daily  . multivitamin with minerals  1 tablet Oral Daily  . niacin  1,000 mg Oral QHS  . nitroGLYCERIN  0.2 mg Transdermal Daily  . omega-3 acid ethyl esters  1 g Oral BID  . sodium chloride  3 mL Intravenous Q12H    Labs:  Recent Labs  02/26/13 1602 02/26/13 2119 02/27/13 0252  NA 136  --  140  K 4.0  --  3.6  CL 101  --  104  CO2 26  --  27  GLUCOSE 113*  --  97  BUN 18  --  14  CREATININE 0.84 0.80 0.94  CALCIUM 9.4  --  9.0  MG 2.2  --   --     Recent Labs  02/26/13 1602 02/27/13 0252  AST 38* 36  ALT 27 23  ALKPHOS 62 56  BILITOT 0.2* 0.4  PROT 7.3 6.4  ALBUMIN 3.8 3.4*    Recent Labs  02/26/13 1602 02/26/13 2119  WBC 7.1 6.0  NEUTROABS 5.3  --   HGB 15.5 14.5  HCT 45.5 41.6  MCV 83.9 83.0  PLT 114* 116*    Recent Labs  02/26/13 2119 02/27/13 0252 02/27/13 0925  TROPONINI 1.40* 1.67* 0.81*    Recent Labs  02/26/13 2119  TSH 1.571    Radiology/Studies: Dg Chest 2 View  02/26/2013  *RADIOLOGY REPORT*  Clinical Data: Defibrillator discharge  CHEST - 2 VIEW  Comparison: 08/10/2009  Findings: AICD is unchanged from the prior study.  Mild vascular congestion without edema or effusion.  Negative for pneumonia.  IMPRESSION: Mild vascular congestion without overt heart failure.  Lungs are otherwise clear.   Original Report Authenticated By: Janeece Riggers, M.D.    Last echocardiogram in January 2014 showed an ejection fraction of 40% with akinesis of the posterior wall and basal to mid anterolateral wall, grade 1 diastolic dysfunction, mild left atrial enlargement, trace mitral regurgitation  Last nuclear study in October 2012 showed an ejection fraction of 45%, prior inferolateral and lateral scar but no ischemia  Telemetry: sinus rhythm with occasional multifocal PVCs, ventricular couplets  Device interrogation: normal single chamber ICD function, good battery status, stable lead measurements; on 02/26/2013 - 3 episodes -  1st episode (1:15 PM) starts as monomorphic VT CL ~265 msec, given ATP x1 which is unsuccessful then VT morphology changes, given 30J shock x1 which is unsuccessful, VT degenerates into VF which is successfully terminated with another 30J shock; 2nd episode (1:18 PM) is monomorphic VT CL ~234 msec, rate in the VF zone, successfully terminated with 30J shock x1; 3rd episode (1:20 PM) is monomorphic VT CL ~265 msec, successfully terminated with ATP x1    Assessment and Plan  1. VT s/p ICD shock - Mg 2.2, K 4.0 on admission; device interrogation as described above 2. History of VF arrest 2009; has had NSVT seen on prior ICD interrogations 3. Ischemic CM, EF 40% by recent echo 4. CAD - troponin mildly elevated on admission but trended down; likely related to ICD shock  5. History of statin intolerance  PLAN Is cath this am;  Contrast allergy>> premedicate He will be restricted from  driving

## 2013-02-28 NOTE — Progress Notes (Signed)
   Patient: Joel Lin Date of Encounter: 02/28/2013, 8:18 AM Admit date: 02/26/2013     Subjective  Without sob shest pain  .   Objective  Physical Exam: Vitals: BP 106/64  Pulse 58  Temp(Src) 98.2 F (36.8 C) (Oral)  Resp 12  Ht 5' 8.5" (1.74 m)  Wt 174 lb 2.6 oz (79 kg)  BMI 26.09 kg/m2  SpO2 94% Well developed and nourished in no acute distress HENT normal Neck supple with JVP-flat Clear Regular rate and rhythm, no murmurs or gallops Abd-soft with active BS No Clubbing cyanosis edema Skin-warm and dry A & Oriented  Grossly normal sensory and motor function  Intake/Output:  Intake/Output Summary (Last 24 hours) at 02/28/13 0818 Last data filed at 02/28/13 0406  Gross per 24 hour  Intake    600 ml  Output    800 ml  Net   -200 ml    Inpatient Medications:  . aspirin  81 mg Oral Daily  . atenolol  25 mg Oral BID  . colesevelam  1,875 mg Oral BID WC  . diazepam  5 mg Oral On Call  . enoxaparin (LOVENOX) injection  40 mg Subcutaneous Q24H  . lisinopril  2.5 mg Oral Daily  . loratadine  10 mg Oral Daily  . multivitamin with minerals  1 tablet Oral Daily  . niacin  1,000 mg Oral QHS  . nitroGLYCERIN  0.2 mg Transdermal Daily  . omega-3 acid ethyl esters  1 g Oral BID  . sodium chloride  3 mL Intravenous Q12H    Labs:  Recent Labs  02/26/13 1602 02/26/13 2119 02/27/13 0252  NA 136  --  140  K 4.0  --  3.6  CL 101  --  104  CO2 26  --  27  GLUCOSE 113*  --  97  BUN 18  --  14  CREATININE 0.84 0.80 0.94  CALCIUM 9.4  --  9.0  MG 2.2  --   --     Recent Labs  02/26/13 1602 02/27/13 0252  AST 38* 36  ALT 27 23  ALKPHOS 62 56  BILITOT 0.2* 0.4  PROT 7.3 6.4  ALBUMIN 3.8 3.4*    Recent Labs  02/26/13 1602 02/26/13 2119  WBC 7.1 6.0  NEUTROABS 5.3  --   HGB 15.5 14.5  HCT 45.5 41.6  MCV 83.9 83.0  PLT 114* 116*    Recent Labs  02/26/13 2119 02/27/13 0252 02/27/13 0925  TROPONINI 1.40* 1.67* 0.81*    Recent Labs  02/26/13 2119  TSH 1.571    Radiology/Studies: Dg Chest 2 View  02/26/2013  *RADIOLOGY REPORT*  Clinical Data: Defibrillator discharge  CHEST - 2 VIEW  Comparison: 08/10/2009  Findings: AICD is unchanged from the prior study.  Mild vascular congestion without edema or effusion.  Negative for pneumonia.  IMPRESSION: Mild vascular congestion without overt heart failure.  Lungs are otherwise clear.   Original Report Authenticated By: David Clark, M.D.    Last echocardiogram in January 2014 showed an ejection fraction of 40% with akinesis of the posterior wall and basal to mid anterolateral wall, grade 1 diastolic dysfunction, mild left atrial enlargement, trace mitral regurgitation  Last nuclear study in October 2012 showed an ejection fraction of 45%, prior inferolateral and lateral scar but no ischemia  Telemetry: sinus rhythm with occasional multifocal PVCs, ventricular couplets  Device interrogation: normal single chamber ICD function, good battery status, stable lead measurements; on 02/26/2013 - 3 episodes -   1st episode (1:15 PM) starts as monomorphic VT CL ~265 msec, given ATP x1 which is unsuccessful then VT morphology changes, given 30J shock x1 which is unsuccessful, VT degenerates into VF which is successfully terminated with another 30J shock; 2nd episode (1:18 PM) is monomorphic VT CL ~234 msec, rate in the VF zone, successfully terminated with 30J shock x1; 3rd episode (1:20 PM) is monomorphic VT CL ~265 msec, successfully terminated with ATP x1    Assessment and Plan  1. VT s/p ICD shock - Mg 2.2, K 4.0 on admission; device interrogation as described above 2. History of VF arrest 2009; has had NSVT seen on prior ICD interrogations 3. Ischemic CM, EF 40% by recent echo 4. CAD - troponin mildly elevated on admission but trended down; likely related to ICD shock  5. History of statin intolerance  PLAN Is cath this am;  Contrast allergy>> premedicate He will be restricted from  driving 

## 2013-03-01 NOTE — Discharge Summary (Signed)
ELECTROPHYSIOLOGY DISCHARGE SUMMARY    Patient ID: Joel Lin,  MRN: [id number], DOB/AGE: 1941/02/22 72 y.o.  Admit date: 02/26/2013 Discharge date: 03/01/2013  Primary Care Physician: Renford Dills, MD  Primary Cardiologist: Patty Sermons, MD Primary EP: Graciela Husbands, MD  Primary Discharge Diagnosis:  1. VT with ICD shock - pt not inclined to start AAD. Will continue current med Rx. Unable to push beta-blocker secondary to bradycardia. He notes more PVC's when HR is slower.  2. Ischemic CM - stable with LVEF 40%. No signs of active CHF.  3. Elevated troponin - likely related to shocks. Coronary anatomy stable by cath and no angina. Continue medical management.  4. CAD - severe OM stenosis, relatively small vessel. Med management. LAD stent is patent.  Secondary Discharge Diagnoses:  1. Prior VF arrest 2009 2. Dyslipidemia 3. PVD 4. PVCs 5. History of statin intolerance  Procedures This Admission:  1. Cardiac catheterization 02/28/2013 Procedural Findings:  Hemodynamics:  AO 95/6  LV 92/48 with a mean of 66  Coronary angiography:  Coronary dominance: right  Left mainstem: There is mild stenosis in the mid shaft of the left main. This is estimated at 30-40%. The left main divides into the LAD and left circumflex the  Left anterior descending (LAD): The LAD is a large caliber vessel. There is a patent stent in the mid vessel. The proximal vessel is widely patent. Just at the level of the first septal perforator, there is a 30-40% stenosis. The first diagonal is small. The second diagonal is large in caliber. There is a 50% ostial lesion of the second diagonal. The mid and distal LAD are large in caliber without significant stenosis.  Left circumflex (LCx): The left circumflex is patent with no significant proximal vessel stenosis. The first obtuse marginal has 95% stenosis. This is unchanged from the previous study. This is a small caliber vessel. A subbranch of that vessel has an 80%  stenosis. The subbranch is even smaller.  Right coronary artery (RCA): The RCA is dominant. The vessel is patent throughout its course. The distal vessel has 50% stenosis. There is mild diffuse calcification of the RCA. The proximal vessel has 20-30% stenosis. The mid vessel has 20-30% stenosis. The PDA and PLA branches are patent.  Left ventriculography: There is dense akinesis of the inferoapical wall. The other segments of the left ventricle contract normally. The estimated left ventricular ejection fraction is 40%  Final Conclusions:  1. Continued patency of the stented segment in the LAD with mild nonobstructive disease  2. Severe stenosis in the obtuse marginal branch left circumflex, stable from 2009 study  3. Nonobstructive stenosis of the right coronary artery  4. Moderate segmental left ventricular systolic dysfunction  Recommendations: Continue medical therapy for ischemic cardiomyopathy and CAD. The patient's coronary anatomy is unchanged from 2009.   History and Hospital Course:  Joel Lin is a pleasant 72 year old man with an ischemic CM, prior VF arrest s/p ICD implant 2009, CAD, PVD and PVCs who was admitted on 02/26/2013 after receiving ICD shocks. His device interrogation revealed normal single chamber ICD function, good battery status and stable lead measurements with 3 episodes VT/VF (1st episode at 1:15 PM, starts as monomorphic VT CL ~265 msec, given ATP x1 which is unsuccessful then VT morphology changes, given 30J shock x1 which is unsuccessful, VT degenerates into VF which is successfully terminated with another 30J shock; 2nd episode at 1:18 PM is monomorphic VT CL ~234 msec, rate in the VF zone, successfully  terminated with 30J shock x1; 3rd episode at 1:20 PM is monomorphic VT CL ~265 msec, successfully terminated with ATP x1). His troponin was mildly elevated; therefore, he underwent cardiac catheterization to rule out CAD progression, with details as outlined above. His  cardiac cath demonstrated stable CAD. He tolerated this procedure well without any complication. He is ambulating without difficulty. He remains hemodynamically stable. Telemetry reveals sinus rhythm with occasional PVCs, no further VT/VF. Treatment options for VT were reviewed with him in detail, specifically AAD therapy. At this time, Mr. Smalls declines AAD therapy and wishes to continue his current regimen. If he develops recurrent VT, he is willing to reconsider AAD therapy. Up-titration of his BB therapy has been limited by symptomatic bradycardia and increased ventricular ectopy when his HR is slower. For now, he will continue his current BB dose with close follow-up as an outpatient. He was instructed NOT TO DRIVE for at least 6 months until given clearance by Dr. Graciela Husbands. He has been seen, examined and deemed stable for discharge today by Dr. Tonny Bollman.  Discharge Vitals: Blood pressure 123/60, pulse 59, temperature 97.4 F (36.3 C), temperature source Oral, resp. rate 15, height 5' 8.5" (1.74 m), weight 187 lb 13.3 oz (85.2 kg), SpO2 96.00%.   Labs: Lab Results  Component Value Date   WBC 6.0 02/26/2013   HGB 14.5 02/26/2013   HCT 41.6 02/26/2013   MCV 83.0 02/26/2013   PLT 116* 02/26/2013     Recent Labs Lab 02/27/13 0252  NA 140  K 3.6  CL 104  CO2 27  BUN 14  CREATININE 0.94  CALCIUM 9.0  PROT 6.4  BILITOT 0.4  ALKPHOS 56  ALT 23  AST 36  GLUCOSE 97   Lab Results  Component Value Date   CKTOTAL 337* 03/29/2008   CKMB 12.5* 03/29/2008   TROPONINI 0.81* 02/27/2013     Disposition:  The patient is being discharged in stable condition.  Follow-up:     Follow-up Information   Follow up with Rick Duff, PA-C On 03/14/2013. (At 11:30 AM for hospital follow-up)    Contact information:   [address] [place] [address] [phone]     Discharge Medications:    Medication List    TAKE these medications       aspirin 81 MG tablet  Take  81 mg by mouth daily.     atenolol 25 MG tablet  Commonly known as:  TENORMIN  Take 12.5 mg by mouth 2 (two) times daily.     colesevelam 625 MG tablet  Commonly known as:  WELCHOL  Take 3 tablets (1,875 mg total) by mouth 2 (two) times daily with a meal.     fish oil-omega-3 fatty acids 1000 MG capsule  Take 1 g by mouth 2 (two) times daily.     GLUCOSAMINE-MSM DS PO  Take 1 tablet by mouth daily.     ibuprofen 200 MG tablet  Commonly known as:  ADVIL,MOTRIN  Take 400 mg by mouth every 8 (eight) hours as needed for pain.     lisinopril 2.5 MG tablet  Commonly known as:  PRINIVIL,ZESTRIL  Take 1 tablet (2.5 mg total) by mouth daily.     loratadine 10 MG tablet  Commonly known as:  CLARITIN  Take 10 mg by mouth daily.     multivitamin with minerals Tabs  Take 1 tablet by mouth daily.     niacin 1000 MG CR tablet  Commonly known as:  NIASPAN  Take 1 tablet (1,000 mg total) by mouth at bedtime.     nitroGLYCERIN 0.4 MG SL tablet  Commonly known as:  NITROSTAT  Place 1 tablet (0.4 mg total) under the tongue every 5 (five) minutes as needed.     nitroGLYCERIN 0.2 mg/hr  Commonly known as:  NITRODUR - Dosed in mg/24 hr  Place 1 patch (0.2 mg total) onto the skin daily.     VITAMIN D-3 PO  Take 300 mg by mouth 2 (two) times daily.       Duration of Discharge Encounter: Greater than 30 minutes including physician time.  Signed, Rick Duff, PA-C 03/01/2013, 9:58 AM

## 2013-03-01 NOTE — Progress Notes (Signed)
    Subjective:  Feels good. No CP, dyspnea, or palps.  Objective:  Vital Signs in the last 24 hours: Temp:  [97.4 F (36.3 C)-98.2 F (36.8 C)] 97.4 F (36.3 C) (03/21 0700) Pulse Rate:  [55-93] 59 (03/21 0700) Resp:  [12-16] 15 (03/21 0425) BP: (80-129)/(34-75) 123/60 mmHg (03/21 0700) SpO2:  [94 %-96 %] 96 % (03/21 0700) Weight:  [85.2 kg (187 lb 13.3 oz)] 85.2 kg (187 lb 13.3 oz) (03/21 0035)  Intake/Output from previous day: 03/20 0701 - 03/21 0700 In: 1569.8 [P.O.:240; I.V.:1279.8; IV Piggyback:50] Out: 1425 [Urine:1425]  Physical Exam: Pt is alert and oriented, NAD HEENT: normal Neck: JVP - normal Lungs: CTA bilaterally CV: RRR without murmur or gallop Abd: soft, NT, Positive BS, no hepatomegaly Ext: no C/C/E, distal pulses intact and equal, right radial site clear Skin: warm/dry no rash   Lab Results:  Recent Labs  02/26/13 1602 02/26/13 2119  WBC 7.1 6.0  HGB 15.5 14.5  PLT 114* 116*    Recent Labs  02/26/13 1602 02/26/13 2119 02/27/13 0252  NA 136  --  140  K 4.0  --  3.6  CL 101  --  104  CO2 26  --  27  GLUCOSE 113*  --  97  BUN 18  --  14  CREATININE 0.84 0.80 0.94    Recent Labs  02/27/13 0252 02/27/13 0925  TROPONINI 1.67* 0.81*   Tele: NSR with isolated PVC's  Assessment/Plan:  1. VT with ICD shock - pt not inclined to start AAD. Will continue current med Rx. Unable to push beta-blocker secondary to bradycardia. He notes more PVC's when HR is slower. 2. Ischemic CM - stable with LVEF 40%. No signs of active CHF. 3. Elevated troponin - likely related to shocks. Coronary anatomy stable by cath and no angina. Continue medical management. 4. CAD - severe OM stenosis, relatively small vessel. Med management. LAD stent is patent.  Dispo: home today, f/u EP.  Tonny Bollman, M.D. 03/01/2013, 8:46 AM

## 2013-03-05 ENCOUNTER — Telehealth: Payer: Self-pay | Admitting: *Deleted

## 2013-03-05 NOTE — Telephone Encounter (Signed)
TCM phone call. Patient doing well without complaints. No therapy from AICD. Aware of appointment with Adelene Amas. PA.

## 2013-03-14 ENCOUNTER — Encounter: Payer: Medicare Other | Admitting: Cardiology

## 2013-03-19 ENCOUNTER — Encounter: Payer: Medicare Other | Admitting: Cardiology

## 2013-03-22 ENCOUNTER — Ambulatory Visit (INDEPENDENT_AMBULATORY_CARE_PROVIDER_SITE_OTHER): Payer: Medicare Other | Admitting: Cardiology

## 2013-03-22 ENCOUNTER — Encounter: Payer: Medicare Other | Admitting: Cardiology

## 2013-03-22 ENCOUNTER — Encounter: Payer: Self-pay | Admitting: Internal Medicine

## 2013-03-22 VITALS — BP 124/74 | HR 61 | Ht 69.5 in | Wt 174.0 lb

## 2013-03-22 DIAGNOSIS — I472 Ventricular tachycardia, unspecified: Secondary | ICD-10-CM

## 2013-03-22 DIAGNOSIS — I255 Ischemic cardiomyopathy: Secondary | ICD-10-CM

## 2013-03-22 DIAGNOSIS — I2589 Other forms of chronic ischemic heart disease: Secondary | ICD-10-CM

## 2013-03-22 DIAGNOSIS — I4949 Other premature depolarization: Secondary | ICD-10-CM

## 2013-03-22 DIAGNOSIS — I493 Ventricular premature depolarization: Secondary | ICD-10-CM

## 2013-03-22 DIAGNOSIS — Z95810 Presence of automatic (implantable) cardiac defibrillator: Secondary | ICD-10-CM

## 2013-03-22 DIAGNOSIS — I4729 Other ventricular tachycardia: Secondary | ICD-10-CM

## 2013-03-22 LAB — ICD DEVICE OBSERVATION
DEV-0020ICD: NEGATIVE
DEVICE MODEL ICD: 549899
FVT: 0
HV IMPEDENCE: 49 Ohm
PACEART VT: 0
RV LEAD AMPLITUDE: 7.9 mv
RV LEAD THRESHOLD: 1.25 V
TZAT-0001FASTVT: 1
TZAT-0012FASTVT: 200 ms
TZAT-0013SLOWVT: 3
TZAT-0018SLOWVT: NEGATIVE
TZAT-0019FASTVT: 7.5 V
TZAT-0020FASTVT: 1 ms
TZAT-0020SLOWVT: 1 ms
TZON-0003SLOWVT: 330 ms
TZON-0004FASTVT: 24
TZON-0005FASTVT: 6
TZON-0005SLOWVT: 6
TZST-0001FASTVT: 2
TZST-0001FASTVT: 4
TZST-0001FASTVT: 5
TZST-0001SLOWVT: 2
TZST-0001SLOWVT: 4
TZST-0003FASTVT: 30 J
TZST-0003FASTVT: 36 J
TZST-0003SLOWVT: 30 J
VENTRICULAR PACING ICD: 0 pct

## 2013-03-22 NOTE — Progress Notes (Signed)
ELECTROPHYSIOLOGY OFFICE NOTE  Patient ID: KATHRYN LINAREZ MRN: [id number], DOB/AGE: 05/22/1941   Date of Visit: 03/22/2013  Primary Physician: Katy Apo, MD Primary Cardiologist: Patty Sermons, MD Primary EP: Graciela Husbands, MD Reason for Visit: EP/device follow-up  History of Present Illness  Mr. Lundin is a pleasant 72 year old man with an ischemic CM, prior VF arrest s/p ICD implant 2009, CAD, PVD and PVCs who was recently admitted to [facility] on 02/26/2013 after receiving ICD shocks. His device interrogation revealed normal single chamber ICD function, good battery status and stable lead measurements with 3 episodes VT/VF (1st episode at 1:15 PM, starts as monomorphic VT CL ~265 msec, given ATP x1 which is unsuccessful then VT morphology changes, given 30J shock x1 which is unsuccessful, VT degenerates into VF which is successfully terminated with another 30J shock; 2nd episode at 1:18 PM is monomorphic VT CL ~234 msec, rate in the VF zone, successfully terminated with 30J shock x1; 3rd episode at 1:20 PM is monomorphic VT CL ~265 msec, successfully terminated with ATP x1). His troponin was mildly elevated; therefore, he underwent cardiac catheterization which demonstrated stable CAD. Treatment options for VT were reviewed with him in detail, specifically AAD therapy. Mr. Zacharia declines AAD therapy and wishes to continue his current regimen. If he develops recurrent VT, he is willing to reconsider AAD therapy. Up-titration of his BB therapy has been limited by symptomatic bradycardia and increased ventricular ectopy when his HR is slower. Today he presents for hospital follow-up and states, "I feel great." He has no complaints. He denies CP or SOB. He denies palpitations, dizziness, near syncope or syncope. He denies ICD shocks. He denies LE swelling, orthopnea or PND.  Past Medical History Past Medical History  Diagnosis Date  . Coronary artery disease     myoview 2011>>EF of 53% / large area  of old inferolateral infarct but no reversible ischemia          . Cardiac arrest 03/29/2008    at home without resuscitation anywhere from 7-10 minutes before EMS arrived/ The patient was noted to be in ventricular fibrillation  required defibrillation x3               . Hypercoagulable state     Questionable history of a borderline hypercoagulable state  . Hyperlipidemia   . Anoxic brain damage      resolved  . Respiratory failure 2009    Respiratory failure and pulmonary collapse requiring intubation  . 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 PVCs (premature ventricular contractions)     occasionally  . Peripheral vascular disease   . Myocardial infarction   . AAA (abdominal aortic aneurysm)   . Contrast media allergy     Past Surgical History Past Surgical History  Procedure Laterality Date  . Cholecystectomy, laparoscopic  08/13/2009    Calculous cholecystitis  . Insert / replace / remove pacemaker  04/07/2008      cardioverter defibrillator, [employer] Jude Single-chamber defibrillator implantation with intraoperative defibrillation threshold testing /  Durata 7121 dual-coil active defibrillator lead, serial  #[id number]   . Cardiac catheterization  04/04/2008     LAD stent patent, D1 OK, OM1 90/80/80% (2mm vessel), RCA 30%; EF 35%.  . Coronary angioplasty with stent placement  1999    PTCA and stenting of his left anterior descending artery  . Cholecystectomy       Allergies/Intolerances Allergies  Allergen Reactions  . Losartan Potassium Palpitations  . Clopidogrel Bisulfate Hives  .  Iodine Hives    "Renografin"  . Metoprolol Other (See Comments)    Unknown   . Statins Other (See Comments)    Muscle cramps    Current Home Medications Current Outpatient Prescriptions  Medication Sig Dispense Refill  . aspirin 81 MG tablet Take 81 mg by mouth daily.        Marland Kitchen atenolol (TENORMIN) 25 MG tablet Take 12.5 mg by mouth 2 (two) times daily.        . Cholecalciferol (VITAMIN D-3 PO) Take 300 mg by mouth 2 (two) times daily.        . colesevelam (WELCHOL) 625 MG tablet Take 3 tablets (1,875 mg total) by mouth 2 (two) times daily with a meal.  540 tablet  3  . fish oil-omega-3 fatty acids 1000 MG capsule Take 1 g by mouth 2 (two) times daily.      . Glucosamine Sulfate-MSM (GLUCOSAMINE-MSM DS PO) Take 1 tablet by mouth daily.      Marland Kitchen ibuprofen (ADVIL,MOTRIN) 200 MG tablet Take 400 mg by mouth every 8 (eight) hours as needed for pain.      Marland Kitchen lisinopril (PRINIVIL,ZESTRIL) 2.5 MG tablet Take 1 tablet (2.5 mg total) by mouth daily.  90 tablet  4  . loratadine (CLARITIN) 10 MG tablet Take 10 mg by mouth daily.      . Multiple Vitamin (MULTIVITAMIN WITH MINERALS) TABS Take 1 tablet by mouth daily.      . niacin (NIASPAN) 1000 MG CR tablet Take 1 tablet (1,000 mg total) by mouth at bedtime.  90 tablet  3  . nitroGLYCERIN (NITRODUR - DOSED IN MG/24 HR) 0.2 mg/hr Place 1 patch (0.2 mg total) onto the skin daily.  90 patch  3  . nitroGLYCERIN (NITROSTAT) 0.4 MG SL tablet Place 1 tablet (0.4 mg total) under the tongue every 5 (five) minutes as needed.  25 tablet  3   No current facility-administered medications for this visit.    Social History Social History  . Marital Status: Married   Occupational History  . Retired Runner, broadcasting/film/video    Social History Main Topics  . Smoking status: Former Smoker -- 1.00 packs/day for 20 years    Types: Cigarettes    Quit date: 12/23/1979  . Smokeless tobacco: Never Used  . Alcohol Use: No  . Drug Use: No   Review of Systems General: No chills, fever, night sweats or weight changes Cardiovascular: No chest pain, dyspnea on exertion, edema, orthopnea, palpitations, paroxysmal nocturnal dyspnea Dermatological: No rash, lesions or masses Respiratory: No cough, dyspnea Urologic: No hematuria, dysuria Abdominal: No nausea, vomiting, diarrhea, bright red blood per rectum, melena, or hematemesis Neurologic: No visual  changes, weakness, changes in mental status All other systems reviewed and are otherwise negative except as noted above.  Physical Exam Blood pressure 124/74, pulse 61, height 5' 9.5" (1.765 m), weight 174 lb (78.926 kg).  General: Well developed, well appearing 72 year old male in no acute distress. HEENT: Normocephalic, atraumatic. EOMs intact. Sclera nonicteric. Oropharynx clear.  Neck: Supple. No JVD. Lungs: Respirations regular and unlabored, CTA bilaterally. No wheezes, rales or rhonchi. Heart: RRR. S1, S2 present. No murmurs, rub, S3 or S4. Abdomen: Soft, non-distended.  Extremities: No clubbing, cyanosis or edema. DP/PT/Radials 2+ and equal bilaterally. Psych: Normal affect. Neuro: Alert and oriented X 3. Moves all extremities spontaneously.   Diagnostics Recent cardiac cath Final Conclusions:  1. Continued patency of the stented segment in the LAD with mild nonobstructive disease  2. Severe  stenosis in the obtuse marginal branch left circumflex, stable from 2009 study  3. Nonobstructive stenosis of the right coronary artery  4. Moderate segmental left ventricular systolic dysfunction  Recommendations: Continue medical therapy for ischemic cardiomyopathy and CAD. The patient's coronary anatomy is unchanged from 2009.   Device interrogation today - Normal device function. Thresholds and sensing consistent with previous device measurements. Impedance trends stable over time. No evidence of any ventricular arrhythmias. Histogram distribution appropriate for patient and level of activity. No changes made this session. Device programmed at appropriate safety margins. Device programmed to optimize intrinsic conduction. Estimated longevity 3.6 years.   Assessment and Plan 1. VT with ICD shock - No recurrent VT by device interrogation. Normal device function. Not inclined to start AAD. Will continue current med Rx. Unable to push beta-blocker secondary to bradycardia. He notes more PVC's  when HR is slower. Continue routine remote follow-up every 3 months. Return to clinic for follow-up with Dr. Graciela Husbands in 6 months.  2. Ischemic CM with chronic systolic HF - Stable. LVEF 40%. Euvolemic by exam. No signs of decompensated HF.  3. CAD - Stable without anginal symptoms. Recent cath details outlined above. No change since 2009. Medical management recommended. Continue routine follow-up with Dr. Patty Sermons as scheduled.  Signed, Rick Duff, PA-C 03/22/2013, 4:01 PM

## 2013-03-24 ENCOUNTER — Encounter: Payer: Self-pay | Admitting: Cardiology

## 2013-04-11 ENCOUNTER — Other Ambulatory Visit: Payer: Medicare Other

## 2013-04-16 ENCOUNTER — Encounter: Payer: Self-pay | Admitting: Cardiology

## 2013-04-16 ENCOUNTER — Ambulatory Visit (INDEPENDENT_AMBULATORY_CARE_PROVIDER_SITE_OTHER): Payer: Medicare Other | Admitting: Cardiology

## 2013-04-16 VITALS — BP 124/68 | HR 58 | Ht 69.0 in | Wt 173.4 lb

## 2013-04-16 DIAGNOSIS — I2589 Other forms of chronic ischemic heart disease: Secondary | ICD-10-CM

## 2013-04-16 DIAGNOSIS — R635 Abnormal weight gain: Secondary | ICD-10-CM

## 2013-04-16 DIAGNOSIS — E7800 Pure hypercholesterolemia, unspecified: Secondary | ICD-10-CM

## 2013-04-16 DIAGNOSIS — E785 Hyperlipidemia, unspecified: Secondary | ICD-10-CM

## 2013-04-16 DIAGNOSIS — E782 Mixed hyperlipidemia: Secondary | ICD-10-CM

## 2013-04-16 DIAGNOSIS — I255 Ischemic cardiomyopathy: Secondary | ICD-10-CM

## 2013-04-16 MED ORDER — ROSUVASTATIN CALCIUM 5 MG PO TABS: ORAL_TABLET | ORAL | Status: DC

## 2013-04-16 MED ORDER — ATENOLOL 25 MG PO TABS: 12.5000 mg | ORAL_TABLET | Freq: Two times a day (BID) | ORAL | Status: DC

## 2013-04-16 MED ORDER — COLESEVELAM HCL 625 MG PO TABS: 1875.0000 mg | ORAL_TABLET | Freq: Two times a day (BID) | ORAL | Status: DC

## 2013-04-16 MED ORDER — NITROGLYCERIN 0.2 MG/HR TD PT24: 1.0000 | MEDICATED_PATCH | Freq: Every day | TRANSDERMAL | Status: DC

## 2013-04-16 NOTE — Patient Instructions (Addendum)
STOP NIASPAN   START CRESTOR 5 MG EVERY OTHER DAY, RX SENT TO [facility]   Return in the morning for fasting labs (lp/bmet/hfp)  Your physician recommends that you schedule a follow-up appointment in: 3 months with fasting labs (LP/BMET/HFP)

## 2013-04-16 NOTE — Progress Notes (Signed)
Joel Lin Date of Birth:  21-May-1941 [facility] [address] [place], [place]  [address] [phone]         Fax   [phone]  History of Present Illness: This pleasant 72 year old gentleman is seen for a four-month followup office visit. He has a complex past medical history. He has a history of known ischemic heart disease. He has a history of a remote myocardial infarction at the age of 74. There was a large posterolateral myocardial infarction in 1981. The patient had angioplasty and stent of his LAD in 1999. In 2009 he had an out of hospital cardiac arrest at home was successfully resuscitated and treated with hypothermia without significant neurologic sequelae. He now has a defibrillator in place and is followed by Dr. Graciela Husbands. He has a history of hypercholesterolemia. His last nuclear stress test was 10/24/11 and showed an ejection fraction of 45% and a large posterolateral scar but no ischemia. His last echocardiogram was in 2009 and showed an ejection fraction of 35-40%. The patient has a known abdominal aortic aneurysm followed by Dr. Arbie Cookey. His most recent ultrasound showed that the aneurysm had increased in size from 4.5 up to 4.8 cm so he will now be checked at six-month intervals. The patient has his defibrillator monitored through Dr. Odessa Fleming office. In January 2013 he had 2 episodes of ventricular tachycardia which were asymptomatic.  Since we last saw him he was rehospitalized after having 3 consecutive shocks from his defibrillator.  He underwent cardiac catheterization by Dr. Excell Seltzer who told him that his arteries have not shown any progression of atherosclerosis since his previous cardiac catheterization in 2009.  Of note is the fact that one day prior to his defibrillator shocks he had used some eye drops which may have had an epinephrine like effect on his body.  Current Outpatient Prescriptions  Medication Sig Dispense Refill  . aspirin 81 MG  tablet Take 81 mg by mouth daily.        Marland Kitchen atenolol (TENORMIN) 25 MG tablet Take 0.5 tablets (12.5 mg total) by mouth 2 (two) times daily.  90 tablet  3  . Cholecalciferol (VITAMIN D-3 PO) Take 300 mg by mouth daily.       . colesevelam (WELCHOL) 625 MG tablet Take 3 tablets (1,875 mg total) by mouth 2 (two) times daily with a meal.  540 tablet  3  . fish oil-omega-3 fatty acids 1000 MG capsule Take 1 g by mouth 2 (two) times daily.      . Glucosamine Sulfate-MSM (GLUCOSAMINE-MSM DS PO) Take 1 tablet by mouth daily.      Marland Kitchen ibuprofen (ADVIL,MOTRIN) 200 MG tablet Take 400 mg by mouth every 8 (eight) hours as needed for pain.      Marland Kitchen lisinopril (PRINIVIL,ZESTRIL) 2.5 MG tablet Take 1 tablet (2.5 mg total) by mouth daily.  90 tablet  4  . loratadine (CLARITIN) 10 MG tablet Take 10 mg by mouth daily.      . Multiple Vitamin (MULTIVITAMIN WITH MINERALS) TABS Take 1 tablet by mouth daily.      . nitroGLYCERIN (NITRODUR - DOSED IN MG/24 HR) 0.2 mg/hr Place 1 patch (0.2 mg total) onto the skin daily.  90 patch  3  . nitroGLYCERIN (NITROSTAT) 0.4 MG SL tablet Place 1 tablet (0.4 mg total) under the tongue every 5 (five) minutes as needed.  25 tablet  3  . rosuvastatin (CRESTOR) 5 MG tablet 1 tablet every other day  30 tablet  5   No current facility-administered medications for this visit.    Allergies  Allergen Reactions  . Losartan Potassium Palpitations  . Clopidogrel Bisulfate Hives  . Epinephrine   . Iodine Hives    "Renografin"  . Metoprolol Other (See Comments)    Unknown   . Mydriacyl (Tropicamide)   . Phenylephrine Hcl   . Statins Other (See Comments)    Muscle cramps    Patient Active Problem List   Diagnosis Date Noted  . CORONARY ATHEROSCLEROSIS 08/10/2009    Priority: High  . PVC's (premature ventricular contractions) 05/10/2011    Priority: Medium  . HYPERLIPIDEMIA, MIXED 08/10/2009    Priority: Medium  . Sustained VT (ventricular tachycardia) 02/26/2013  . VF  (ventricular fibrillation) 02/26/2013  . Weight gain 12/24/2012  . Sinusitis 12/24/2012  . Abdominal aneurysm without mention of rupture 11/13/2012  . Ischemic cardiomyopathy 08/28/2012  . Paroxysmal ventricular tachycardia 03/22/2012  . Single implantable cardiac defibrillator-[employer] 03/22/2012  . Abdominal aortic aneurysm 05/10/2011  . VENTRICULAR FIBRILLATION 08/10/2009    History  Smoking status  . Former Smoker -- 1.00 packs/day for 20 years  . Types: Cigarettes  . Quit date: 12/23/1979  Smokeless tobacco  . Never Used    History  Alcohol Use No    Family History  Problem Relation Age of Onset  . Coronary artery disease      strong family history of   . Hyperlipidemia Mother   . Heart disease Father     before age 85  . Hyperlipidemia Father     Review of Systems: Constitutional: no fever chills diaphoresis or fatigue or change in weight.  Head and neck: no hearing loss, no epistaxis, no photophobia or visual disturbance. Respiratory: No cough, shortness of breath or wheezing. Cardiovascular: No chest pain peripheral edema, palpitations. Gastrointestinal: No abdominal distention, no abdominal pain, no change in bowel habits hematochezia or melena. Genitourinary: No dysuria, no frequency, no urgency, no nocturia. Musculoskeletal:No arthralgias, no back pain, no gait disturbance or myalgias. Neurological: No dizziness, no headaches, no numbness, no seizures, no syncope, no weakness, no tremors. Hematologic: No lymphadenopathy, no easy bruising. Psychiatric: No confusion, no hallucinations, no sleep disturbance.    Physical Exam: Filed Vitals:   04/16/13 1028  BP: 124/68  Pulse: 58   the general appearance reveals a well-developed well-nourished middle-aged gentleman in no distress.The head and neck exam reveals pupils equal and reactive.  Extraocular movements are full.  There is no scleral icterus.  The mouth and pharynx are normal.  The neck is supple.  The  carotids reveal no bruits.  The jugular venous pressure is normal.  The  thyroid is not enlarged.  There is no lymphadenopathy.  The chest is clear to percussion and auscultation.  There are no rales or rhonchi.  Expansion of the chest is symmetrical.  The precordium is quiet.  The first heart sound is normal.  The second heart sound is physiologically split.  There is no murmur gallop rub or click.  There is no abnormal lift or heave.  The abdomen is soft and nontender.  The bowel sounds are normal.  The liver and spleen are not enlarged.  There are no abdominal masses.  There are no abdominal bruits.  Extremities reveal good pedal pulses.  There is no phlebitis or edema.  There is no cyanosis or clubbing.  Strength is normal and symmetrical in all extremities.  There is no lateralizing weakness.  There are no sensory deficits.  The  skin is warm and dry.  There is no rash.     Assessment / Plan: Continue same medication except stop Niaspan and start Crestor 5 mg every other day. The patient was to have had fasting labs today but did not fast so he will return tomorrow for fasting labs. Recheck in 3 months for followup office visit and fasting lipid panel hepatic function panel basal metabolic panel

## 2013-04-16 NOTE — Assessment & Plan Note (Signed)
The patient is back in weight watchers program and has lost 11 pounds since last visit and is pleased with his progress.  His wife is also program with him

## 2013-04-16 NOTE — Assessment & Plan Note (Signed)
The patient has not been experiencing any chest pain or angina. 

## 2013-04-16 NOTE — Assessment & Plan Note (Signed)
The patient discussed some concern about the possibility that his Niaspan could be having some adverse effects.  We will stop his Niaspan and in its place use low-dose Crestor 5 mg every other day.  He is intolerant to high-dose statins but may be able to get by on a low dose every other day.

## 2013-04-17 ENCOUNTER — Other Ambulatory Visit (INDEPENDENT_AMBULATORY_CARE_PROVIDER_SITE_OTHER): Payer: Medicare Other

## 2013-04-17 DIAGNOSIS — E785 Hyperlipidemia, unspecified: Secondary | ICD-10-CM

## 2013-04-17 LAB — BASIC METABOLIC PANEL
Calcium: 9.3 mg/dL (ref 8.4–10.5)
Creatinine, Ser: 0.9 mg/dL (ref 0.4–1.5)
GFR: 87.34 mL/min (ref >60.00)
Glucose, Bld: 98 mg/dL (ref 70–99)
Sodium: 138 mEq/L (ref 135–145)

## 2013-04-17 LAB — HEPATIC FUNCTION PANEL
ALT: 21 U/L (ref 0–53)
AST: 27 U/L (ref 0–37)
Total Bilirubin: 0.7 mg/dL (ref 0.3–1.2)

## 2013-04-17 LAB — LIPID PANEL
HDL: 38.4 mg/dL — ABNORMAL LOW (ref >39.00)
Triglycerides: 94 mg/dL (ref 0.0–149.0)

## 2013-04-21 NOTE — Progress Notes (Signed)
Quick Note:  Please report to patient. The recent labs are stable. Continue same medication and careful diet. The LDL is higher 121. This should improve with the recent addition of QOD crestor. ______

## 2013-05-14 ENCOUNTER — Ambulatory Visit: Payer: Medicare Other | Admitting: Neurosurgery

## 2013-05-14 ENCOUNTER — Encounter (INDEPENDENT_AMBULATORY_CARE_PROVIDER_SITE_OTHER): Payer: Medicare Other | Admitting: *Deleted

## 2013-05-14 DIAGNOSIS — I714 Abdominal aortic aneurysm, without rupture, unspecified: Secondary | ICD-10-CM

## 2013-05-28 ENCOUNTER — Telehealth: Payer: Self-pay | Admitting: Cardiology

## 2013-05-28 DIAGNOSIS — E785 Hyperlipidemia, unspecified: Secondary | ICD-10-CM

## 2013-05-28 MED ORDER — ROSUVASTATIN CALCIUM 5 MG PO TABS: ORAL_TABLET | ORAL | Status: DC

## 2013-05-28 NOTE — Telephone Encounter (Signed)
New problem   Pt needs prescription for crestor sent to [facility]-pt wants 1 yr prescription with 3 month supply at a time

## 2013-05-28 NOTE — Telephone Encounter (Signed)
A refill for Crestor 5 mg send to [facility]. Pt take crestor 5 mg one tablet every other day: dispense 45 pills and 3 refills; Pt aware.

## 2013-05-29 ENCOUNTER — Other Ambulatory Visit: Payer: Self-pay

## 2013-05-29 DIAGNOSIS — I714 Abdominal aortic aneurysm, without rupture, unspecified: Secondary | ICD-10-CM

## 2013-05-29 DIAGNOSIS — Z48812 Encounter for surgical aftercare following surgery on the circulatory system: Secondary | ICD-10-CM

## 2013-05-31 ENCOUNTER — Encounter: Payer: Self-pay | Admitting: Vascular Surgery

## 2013-06-24 ENCOUNTER — Encounter: Payer: Self-pay | Admitting: Internal Medicine

## 2013-06-24 ENCOUNTER — Ambulatory Visit (INDEPENDENT_AMBULATORY_CARE_PROVIDER_SITE_OTHER): Payer: Medicare Other | Admitting: *Deleted

## 2013-06-24 DIAGNOSIS — I472 Ventricular tachycardia, unspecified: Secondary | ICD-10-CM

## 2013-06-24 DIAGNOSIS — I4729 Other ventricular tachycardia: Secondary | ICD-10-CM

## 2013-06-24 DIAGNOSIS — Z95810 Presence of automatic (implantable) cardiac defibrillator: Secondary | ICD-10-CM

## 2013-06-25 LAB — REMOTE ICD DEVICE
BATTERY VOLTAGE: 2.63 V
HV IMPEDENCE: 45 Ohm
RV LEAD AMPLITUDE: 7.3 mv
RV LEAD IMPEDENCE ICD: 330 Ohm
TZAT-0001SLOWVT: 1
TZAT-0004SLOWVT: 8
TZAT-0012SLOWVT: 200 ms
TZAT-0013FASTVT: 1
TZAT-0018FASTVT: NEGATIVE
TZAT-0018SLOWVT: NEGATIVE
TZAT-0019FASTVT: 7.5 V
TZAT-0019SLOWVT: 7.5 V
TZAT-0020FASTVT: 1 ms
TZAT-0020SLOWVT: 1 ms
TZON-0003FASTVT: 280 ms
TZON-0003SLOWVT: 330 ms
TZON-0004FASTVT: 24
TZON-0004SLOWVT: 35
TZON-0005FASTVT: 6
TZON-0005SLOWVT: 6
TZON-0010FASTVT: 80 ms
TZON-0010SLOWVT: 80 ms
TZST-0001FASTVT: 2
TZST-0001FASTVT: 4
TZST-0001SLOWVT: 3
TZST-0001SLOWVT: 4
TZST-0003FASTVT: 36 J
TZST-0003FASTVT: 36 J
TZST-0003SLOWVT: 20 J
TZST-0003SLOWVT: 36 J
VENTRICULAR PACING ICD: 1 pct

## 2013-07-04 ENCOUNTER — Encounter: Payer: Self-pay | Admitting: *Deleted

## 2013-07-10 ENCOUNTER — Other Ambulatory Visit: Payer: Self-pay | Admitting: *Deleted

## 2013-07-10 ENCOUNTER — Other Ambulatory Visit (INDEPENDENT_AMBULATORY_CARE_PROVIDER_SITE_OTHER): Payer: Medicare Other

## 2013-07-10 DIAGNOSIS — E7800 Pure hypercholesterolemia, unspecified: Secondary | ICD-10-CM

## 2013-07-10 LAB — HEPATIC FUNCTION PANEL
ALT: 32 U/L (ref 0–53)
AST: 39 U/L — ABNORMAL HIGH (ref 0–37)
Albumin: 4 g/dL (ref 3.5–5.2)
Alkaline Phosphatase: 64 U/L (ref 39–117)
Bilirubin, Direct: 0 mg/dL (ref 0.0–0.3)
Total Protein: 7.2 g/dL (ref 6.0–8.3)

## 2013-07-10 LAB — BASIC METABOLIC PANEL
BUN: 24 mg/dL — ABNORMAL HIGH (ref 6–23)
Calcium: 9.4 mg/dL (ref 8.4–10.5)
Chloride: 107 mEq/L (ref 96–112)
Creatinine, Ser: 0.9 mg/dL (ref 0.4–1.5)
GFR: 85.12 mL/min (ref >60.00)

## 2013-07-10 LAB — LIPID PANEL
Cholesterol: 150 mg/dL (ref 0–200)
LDL Cholesterol: 97 mg/dL (ref 0–99)
Triglycerides: 85 mg/dL (ref 0.0–149.0)

## 2013-07-10 NOTE — Progress Notes (Signed)
Quick Note:  Please make copy of labs for patient visit. ______ 

## 2013-07-17 ENCOUNTER — Ambulatory Visit (INDEPENDENT_AMBULATORY_CARE_PROVIDER_SITE_OTHER): Payer: Medicare Other | Admitting: Cardiology

## 2013-07-17 ENCOUNTER — Encounter: Payer: Self-pay | Admitting: Cardiology

## 2013-07-17 VITALS — BP 130/72 | HR 67 | Ht 69.0 in | Wt 174.4 lb

## 2013-07-17 DIAGNOSIS — I2589 Other forms of chronic ischemic heart disease: Secondary | ICD-10-CM

## 2013-07-17 DIAGNOSIS — E7800 Pure hypercholesterolemia, unspecified: Secondary | ICD-10-CM

## 2013-07-17 DIAGNOSIS — E782 Mixed hyperlipidemia: Secondary | ICD-10-CM

## 2013-07-17 DIAGNOSIS — I472 Ventricular tachycardia: Secondary | ICD-10-CM

## 2013-07-17 DIAGNOSIS — I255 Ischemic cardiomyopathy: Secondary | ICD-10-CM

## 2013-07-17 DIAGNOSIS — I4729 Other ventricular tachycardia: Secondary | ICD-10-CM

## 2013-07-17 NOTE — Patient Instructions (Addendum)
Your physician recommends that you continue on your current medications as directed. Please refer to the Current Medication list given to you today.  Your physician recommends that you schedule a follow-up appointment in: 3 month

## 2013-07-17 NOTE — Assessment & Plan Note (Signed)
The patient has not been aware of any tachycardia or palpitations

## 2013-07-17 NOTE — Assessment & Plan Note (Signed)
The patient has a history of hypercholesterolemia.  He is unable to tolerate more than just a minimal amount of statin because of myalgias.  He has also been on WelChol.  His lipids are satisfactory.  The cost of his WelChol has gone up considerably because of a change in insurance coverage.  We discussed possibly cutting back slightly on his WelChol and increasing his Crestor slightly and see if we can get by with less WelChol.  Recheck in 3 months

## 2013-07-17 NOTE — Progress Notes (Signed)
Joel Lin Date of Birth:  [birth year] [facility] [address] [place], [place]  [address] [phone]         Fax   [phone]  History of Present Illness: This pleasant 72 year old gentleman is seen for a four-month followup office visit. He has a complex past medical history. He has a history of known ischemic heart disease. He has a history of a remote myocardial infarction at the age of 12. There was a large posterolateral myocardial infarction in 1981. The patient had angioplasty and stent of his LAD in 1999. In 2009 he had an out of hospital cardiac arrest at home was successfully resuscitated and treated with hypothermia without significant neurologic sequelae. He now has a defibrillator in place and is followed by Dr. Graciela Husbands. He has a history of hypercholesterolemia. His last nuclear stress test was 10/24/11 and showed an ejection fraction of 45% and a large posterolateral scar but no ischemia. His last echocardiogram was in 2009 and showed an ejection fraction of 35-40%. The patient has a known abdominal aortic aneurysm followed by Dr. Arbie Cookey. His most recent ultrasound showed that the aneurysm had increased in size from 4.5 up to 4.8 cm so he will now be checked at six-month intervals. The patient has his defibrillator monitored through Dr. Odessa Fleming office. In January 2013 he had 2 episodes of ventricular tachycardia which were asymptomatic.  In March 2014 he was rehospitalized after having 3 consecutive shocks from his defibrillator. He underwent cardiac catheterization by Dr. Excell Seltzer on 02/28/13 who told him that his arteries have not shown any progression of atherosclerosis since his previous cardiac catheterization in 2009. Of note is the fact that one day prior to his defibrillator shocks he had used some eye drops which may have had an epinephrine like effect on his body.  Since then he has had no further defibrillator discharges.  He has had a stressful summer  because of family illness.  His mother is a patient in one of the nursing homes here and one of their daughters has been chronically ill in [place].   Current Outpatient Prescriptions  Medication Sig Dispense Refill  . aspirin 81 MG tablet Take 81 mg by mouth daily.        Marland Kitchen atenolol (TENORMIN) 25 MG tablet Take 0.5 tablets (12.5 mg total) by mouth 2 (two) times daily.  90 tablet  3  . Cholecalciferol (VITAMIN D-3 PO) Take 300 mg by mouth daily.       . colesevelam (WELCHOL) 625 MG tablet Take 3 tablets (1,875 mg total) by mouth 2 (two) times daily with a meal.  540 tablet  3  . fish oil-omega-3 fatty acids 1000 MG capsule Take 1 g by mouth 2 (two) times daily.      . Glucosamine Sulfate-MSM (GLUCOSAMINE-MSM DS PO) Take 1 tablet by mouth daily.      Marland Kitchen ibuprofen (ADVIL,MOTRIN) 200 MG tablet Take 400 mg by mouth every 8 (eight) hours as needed for pain.      Marland Kitchen lisinopril (PRINIVIL,ZESTRIL) 2.5 MG tablet Take 1 tablet (2.5 mg total) by mouth daily.  90 tablet  4  . loratadine (CLARITIN) 10 MG tablet Take 10 mg by mouth daily.      . Multiple Vitamin (MULTIVITAMIN WITH MINERALS) TABS Take 1 tablet by mouth daily.      . nitroGLYCERIN (NITRODUR - DOSED IN MG/24 HR) 0.2 mg/hr Place 1 patch (0.2 mg total) onto the skin daily.  90 patch  3  .  nitroGLYCERIN (NITROSTAT) 0.4 MG SL tablet Place 1 tablet (0.4 mg total) under the tongue every 5 (five) minutes as needed.  25 tablet  3  . rosuvastatin (CRESTOR) 5 MG tablet 1 tablet every other day  45 tablet  3   No current facility-administered medications for this visit.    Allergies  Allergen Reactions  . Losartan Potassium Palpitations  . Clopidogrel Bisulfate Hives  . Epinephrine   . Iodine Hives    "Renografin"  . Metoprolol Other (See Comments)    Unknown   . Mydriacyl (Tropicamide)   . Phenylephrine Hcl   . Statins Other (See Comments)    Muscle cramps    Patient Active Problem List   Diagnosis Date Noted  . CORONARY ATHEROSCLEROSIS  08/10/2009    Priority: High  . PVC's (premature ventricular contractions) 05/10/2011    Priority: Medium  . HYPERLIPIDEMIA, MIXED 08/10/2009    Priority: Medium  . Sustained VT (ventricular tachycardia) 02/26/2013  . VF (ventricular fibrillation) 02/26/2013  . Weight gain 12/24/2012  . Sinusitis 12/24/2012  . Abdominal aneurysm without mention of rupture 11/13/2012  . Ischemic cardiomyopathy 08/28/2012  . Paroxysmal ventricular tachycardia 03/22/2012  . Single implantable cardiac defibrillator-[employer] 03/22/2012  . Abdominal aortic aneurysm 05/10/2011  . VENTRICULAR FIBRILLATION 08/10/2009    History  Smoking status  . Former Smoker -- 1.00 packs/day for 20 years  . Types: Cigarettes  . Quit date: 12/23/1979  Smokeless tobacco  . Never Used    History  Alcohol Use No    Family History  Problem Relation Age of Onset  . Coronary artery disease      strong family history of   . Hyperlipidemia Mother   . Heart disease Father     before age 35  . Hyperlipidemia Father     Review of Systems: Constitutional: no fever chills diaphoresis or fatigue or change in weight.  Head and neck: no hearing loss, no epistaxis, no photophobia or visual disturbance. Respiratory: No cough, shortness of breath or wheezing. Cardiovascular: No chest pain peripheral edema, palpitations. Gastrointestinal: No abdominal distention, no abdominal pain, no change in bowel habits hematochezia or melena. Genitourinary: No dysuria, no frequency, no urgency, no nocturia. Musculoskeletal:No arthralgias, no back pain, no gait disturbance or myalgias. Neurological: No dizziness, no headaches, no numbness, no seizures, no syncope, no weakness, no tremors. Hematologic: No lymphadenopathy, no easy bruising. Psychiatric: No confusion, no hallucinations, no sleep disturbance.    Physical Exam: Filed Vitals:   07/17/13 0914  BP: 130/72  Pulse: 67   the general appearance reveals a well-developed  well-nourished Caucasian gentleman in no distress.The head and neck exam reveals pupils equal and reactive.  Extraocular movements are full.  There is no scleral icterus.  The mouth and pharynx are normal.  The neck is supple.  The carotids reveal no bruits.  The jugular venous pressure is normal.  The  thyroid is not enlarged.  There is no lymphadenopathy.  The chest is clear to percussion and auscultation.  There are no rales or rhonchi.  Expansion of the chest is symmetrical.  The precordium is quiet.  The first heart sound is normal.  The second heart sound is physiologically split.  There is no murmur gallop rub or click.  There is no abnormal lift or heave.  The abdomen is soft and nontender.  The bowel sounds are normal.  The liver and spleen are not enlarged.  There are no abdominal masses.  There are no abdominal bruits.  Extremities reveal good pedal pulses.  There is no phlebitis or edema.  There is no cyanosis or clubbing.  Strength is normal and symmetrical in all extremities.  There is no lateralizing weakness.  There are no sensory deficits.  The skin is warm and dry.  There is no rash.     Assessment / Plan: Continue on same medications.  We reviewed his lab work today.  Over the next 3 months he will try increasing Crestor slightly and cutting back slightly on WelChol and see if we can get by on less WelChol because of the very high cost of the medicine for him.

## 2013-07-17 NOTE — Assessment & Plan Note (Signed)
The patient has not been experiencing any symptoms of congestive heart failure 

## 2013-07-23 ENCOUNTER — Encounter: Payer: Self-pay | Admitting: Cardiology

## 2013-09-25 ENCOUNTER — Encounter: Payer: Self-pay | Admitting: Internal Medicine

## 2013-09-25 ENCOUNTER — Ambulatory Visit (INDEPENDENT_AMBULATORY_CARE_PROVIDER_SITE_OTHER): Payer: Medicare Other | Admitting: Internal Medicine

## 2013-09-25 VITALS — BP 148/75 | HR 58 | Ht 69.0 in | Wt 178.2 lb

## 2013-09-25 DIAGNOSIS — I472 Ventricular tachycardia, unspecified: Secondary | ICD-10-CM

## 2013-09-25 DIAGNOSIS — E782 Mixed hyperlipidemia: Secondary | ICD-10-CM

## 2013-09-25 DIAGNOSIS — I255 Ischemic cardiomyopathy: Secondary | ICD-10-CM

## 2013-09-25 DIAGNOSIS — I4729 Other ventricular tachycardia: Secondary | ICD-10-CM

## 2013-09-25 DIAGNOSIS — I2589 Other forms of chronic ischemic heart disease: Secondary | ICD-10-CM

## 2013-09-25 DIAGNOSIS — Z4502 Encounter for adjustment and management of automatic implantable cardiac defibrillator: Secondary | ICD-10-CM | POA: Insufficient documentation

## 2013-09-25 DIAGNOSIS — Z95810 Presence of automatic (implantable) cardiac defibrillator: Secondary | ICD-10-CM

## 2013-09-25 LAB — ICD DEVICE OBSERVATION
BATTERY VOLTAGE: 2.5869 V
BRDY-0002RV: 40 {beats}/min
DEVICE MODEL ICD: 549899
FVT: 0
TOT-0007: 1
TOT-0010: 35
TZAT-0001FASTVT: 1
TZAT-0001SLOWVT: 1
TZAT-0012FASTVT: 200 ms
TZAT-0018SLOWVT: NEGATIVE
TZAT-0020FASTVT: 1 ms
TZON-0005FASTVT: 6
TZON-0010SLOWVT: 80 ms
TZST-0001FASTVT: 2
TZST-0001FASTVT: 5
TZST-0001SLOWVT: 2
TZST-0001SLOWVT: 5
TZST-0003FASTVT: 30 J
TZST-0003FASTVT: 36 J
TZST-0003SLOWVT: 20 J
TZST-0003SLOWVT: 36 J
VENTRICULAR PACING ICD: 0.25 pct
VF: 0

## 2013-09-25 NOTE — Assessment & Plan Note (Signed)
The patient's device was interrogated.  The information was reviewed. No changes were made in the programming.    

## 2013-09-25 NOTE — Assessment & Plan Note (Signed)
Tolerating the qod statin  And niaspan has been discontinued

## 2013-09-25 NOTE — Assessment & Plan Note (Signed)
No intercurrent fibrillator discharges

## 2013-09-25 NOTE — Progress Notes (Signed)
Patient Care Team: Katy Apo, MD as PCP - General (Internal Medicine) Cassell Clement, MD as Attending Physician (Cardiology) Duke Salvia, MD as Attending Physician (Cardiology)   HPI  Joel Lin is a 72 y.o. male is seen in followup for aborted sudden cardiac death in the setting of ischemic heart disease with  prior PCI and mild depression of LV systolic function. He is status post ICD implantation.   The patient denies chest pain, shortness of breath, nocturnal dyspnea, orthopnea or peripheral edema. There have been no palpitations, lightheadedness or syncope.   Note some  intrusion to at his device site of the leads   There has been Myoview scan in May 13; ejection fraction had improved to 53%; previous IMI evident. Recent echo was corroborating   Has a AAA at 4.8 cm is followed every 6 months by Dr. early   He has a history of prior inappropriate ICD discharges secondary to sinus tachycardia  He also has in 2013 ventricular tachycardia terminated with antitachycardia pacing;  this was unassociated with symptoms and wastreated with increased dose of beta blockers   he tells a that his daughter has received a [employer] Jude brain stimulator for chronic headaches and that has relieved all of her symptoms after 12 years   Past Medical History  Diagnosis Date  . Coronary artery disease     myoview 2011>>EF of 53% / large area of old inferolateral infarct but no reversible ischemia          . Cardiac arrest 03/29/2008    at home without resuscitation anywhere from 7-10 minutes before EMS arrived/ The patient was noted to be in ventricular fibrillation  required defibrillation x3               . Hypercoagulable state     Questionable history of a borderline hypercoagulable state  . Hyperlipidemia   . Anoxic brain damage      resolved  . Respiratory failure 2009    Respiratory failure and pulmonary collapse requiring intubation  . ICD (implantable cardiac  defibrillator), single, in situ 04/19/2010  . PVCs (premature ventricular contractions)     occasionally  . Peripheral vascular disease   . Myocardial infarction   . AAA (abdominal aortic aneurysm)   . Contrast media allergy     Past Surgical History  Procedure Laterality Date  . Cholecystectomy, laparoscopic  08/13/2009    Calculous cholecystitis  . Insert / replace / remove pacemaker  04/07/2008      cardioverter defibrillator, [employer] Jude Single-chamber defibrillator implantation with intraoperative defibrillation threshold testing /  Durata 7121 dual-coil active defibrillator lead, serial  #[id number]   . Cardiac catheterization  04/04/2008     LAD stent patent, D1 OK, OM1 90/80/80% (2mm vessel), RCA 30%; EF 35%.  . Coronary angioplasty with stent placement  1999    PTCA and stenting of his left anterior descending artery  . Cholecystectomy      Current Outpatient Prescriptions  Medication Sig Dispense Refill  . aspirin 81 MG tablet Take 81 mg by mouth daily.        Marland Kitchen atenolol (TENORMIN) 25 MG tablet Take 0.5 tablets (12.5 mg total) by mouth 2 (two) times daily.  90 tablet  3  . Cholecalciferol (VITAMIN D-3 PO) Take 300 mg by mouth daily.       . colesevelam (WELCHOL) 625 MG tablet Take 3 tablets (1,875 mg total) by mouth 2 (two) times daily with  a meal.  540 tablet  3  . fish oil-omega-3 fatty acids 1000 MG capsule Take 1 g by mouth 2 (two) times daily.      . Glucosamine Sulfate-MSM (GLUCOSAMINE-MSM DS PO) Take 1 tablet by mouth daily.      Marland Kitchen ibuprofen (ADVIL,MOTRIN) 200 MG tablet Take 400 mg by mouth every 8 (eight) hours as needed for pain.      Marland Kitchen lisinopril (PRINIVIL,ZESTRIL) 2.5 MG tablet Take 1 tablet (2.5 mg total) by mouth daily.  90 tablet  4  . loratadine (CLARITIN) 10 MG tablet Take 10 mg by mouth daily.      . Multiple Vitamin (MULTIVITAMIN WITH MINERALS) TABS Take 1 tablet by mouth daily.      . nitroGLYCERIN (NITRODUR - DOSED IN MG/24 HR) 0.2 mg/hr Place 1 patch (0.2  mg total) onto the skin daily.  90 patch  3  . nitroGLYCERIN (NITROSTAT) 0.4 MG SL tablet Place 1 tablet (0.4 mg total) under the tongue every 5 (five) minutes as needed.  25 tablet  3  . rosuvastatin (CRESTOR) 5 MG tablet 1 tablet every other day  45 tablet  3   No current facility-administered medications for this visit.    Allergies  Allergen Reactions  . Losartan Potassium Palpitations  . Clopidogrel Bisulfate Hives  . Epinephrine   . Iodine Hives    "Renografin"  . Metoprolol Other (See Comments)    Unknown   . Mydriacyl [Tropicamide]   . Phenylephrine Hcl   . Statins Other (See Comments)    Muscle cramps    Review of Systems negative except from HPI and PMH  Physical Exam BP 148/75  Pulse 58  Ht 5\' 9"  (1.753 m)  Wt 178 lb 3.2 oz (80.831 kg)  BMI 26.3 kg/m2 Well developed and well nourished in no acute distress HENT normal E scleral and icterus clear Neck Supple JVP flat; carotids brisk and full Clear to ausculation Device pocket well healed; without hematoma or erythema.  There is no tethering Regular rate and rhythm, no murmurs gallops or rub Soft with active bowel sounds No clubbing cyanosis none Edema Alert and oriented, grossly normal motor and sensory function Skin Warm and Dry   ECG sinus rhythm at 57 Intervals 23/11/42 Otherwise normal   Assessment and  Plan

## 2013-09-25 NOTE — Assessment & Plan Note (Signed)
Stable on current meds 

## 2013-09-25 NOTE — Patient Instructions (Signed)
Your physician wants you to follow-up in: one year with Dr. Klein.  You will receive a reminder letter in the mail two months in advance. If you don't receive a letter, please call our office to schedule the follow-up appointment.  

## 2013-10-03 ENCOUNTER — Encounter: Payer: Self-pay | Admitting: Internal Medicine

## 2013-10-15 ENCOUNTER — Other Ambulatory Visit (INDEPENDENT_AMBULATORY_CARE_PROVIDER_SITE_OTHER): Payer: Medicare Other

## 2013-10-15 DIAGNOSIS — E7800 Pure hypercholesterolemia, unspecified: Secondary | ICD-10-CM

## 2013-10-15 LAB — HEPATIC FUNCTION PANEL
Albumin: 3.9 g/dL (ref 3.5–5.2)
Alkaline Phosphatase: 59 U/L (ref 39–117)

## 2013-10-15 LAB — BASIC METABOLIC PANEL
CO2: 29 mEq/L (ref 19–32)
Chloride: 105 mEq/L (ref 96–112)
Creatinine, Ser: 0.9 mg/dL (ref 0.4–1.5)

## 2013-10-15 LAB — LIPID PANEL
HDL: 36 mg/dL — ABNORMAL LOW (ref >39.00)
LDL Cholesterol: 91 mg/dL (ref 0–99)
Total CHOL/HDL Ratio: 4
Triglycerides: 130 mg/dL (ref 0.0–149.0)

## 2013-10-15 NOTE — Progress Notes (Signed)
Quick Note:  Please make copy of labs for patient visit. ______ 

## 2013-10-17 ENCOUNTER — Other Ambulatory Visit: Payer: Self-pay | Admitting: Vascular Surgery

## 2013-10-17 DIAGNOSIS — I714 Abdominal aortic aneurysm, without rupture, unspecified: Secondary | ICD-10-CM

## 2013-10-21 ENCOUNTER — Ambulatory Visit: Payer: Medicare Other | Admitting: Cardiology

## 2013-10-28 ENCOUNTER — Encounter: Payer: Self-pay | Admitting: Cardiology

## 2013-10-28 ENCOUNTER — Ambulatory Visit (INDEPENDENT_AMBULATORY_CARE_PROVIDER_SITE_OTHER): Payer: Medicare Other | Admitting: Cardiology

## 2013-10-28 VITALS — BP 154/78 | HR 68 | Ht 69.0 in | Wt 179.0 lb

## 2013-10-28 DIAGNOSIS — I255 Ischemic cardiomyopathy: Secondary | ICD-10-CM

## 2013-10-28 DIAGNOSIS — R6889 Other general symptoms and signs: Secondary | ICD-10-CM

## 2013-10-28 DIAGNOSIS — E782 Mixed hyperlipidemia: Secondary | ICD-10-CM

## 2013-10-28 DIAGNOSIS — I2510 Atherosclerotic heart disease of native coronary artery without angina pectoris: Secondary | ICD-10-CM

## 2013-10-28 DIAGNOSIS — Z79899 Other long term (current) drug therapy: Secondary | ICD-10-CM

## 2013-10-28 DIAGNOSIS — I472 Ventricular tachycardia, unspecified: Secondary | ICD-10-CM

## 2013-10-28 DIAGNOSIS — Z95810 Presence of automatic (implantable) cardiac defibrillator: Secondary | ICD-10-CM

## 2013-10-28 DIAGNOSIS — I4729 Other ventricular tachycardia: Secondary | ICD-10-CM

## 2013-10-28 DIAGNOSIS — I2589 Other forms of chronic ischemic heart disease: Secondary | ICD-10-CM

## 2013-10-28 DIAGNOSIS — E7800 Pure hypercholesterolemia, unspecified: Secondary | ICD-10-CM

## 2013-10-28 MED ORDER — LISINOPRIL 2.5 MG PO TABS: 2.5000 mg | ORAL_TABLET | Freq: Every day | ORAL | Status: DC

## 2013-10-28 NOTE — Assessment & Plan Note (Signed)
The patient has not had any recurrent chest pain or angina. He states that one of his daughters has been diagnosed as having elevated homocystine levels.  He is requesting a homocystine level but unfortunately it would not go through on our computer.  He will check with his PCP to see if that test has been done recently elsewhere.

## 2013-10-28 NOTE — Assessment & Plan Note (Signed)
He states that his exercise tolerance is good.  He enjoys doing yard work and is not having any exertional chest pain or shortness of breath.

## 2013-10-28 NOTE — Progress Notes (Signed)
Joel Lin Date of Birth:  02-09-1941 [address] [place], [place]  [address] [phone]         Fax   [phone]  History of Present Illness: This pleasant 72 year old gentleman is seen for a four-month followup office visit. He has a complex past medical history. He has a history of known ischemic heart disease. He has a history of a remote myocardial infarction at the age of 73. There was a large posterolateral myocardial infarction in 1981. The patient had angioplasty and stent of his LAD in 1999. In 2009 he had an out of hospital cardiac arrest at home was successfully resuscitated and treated with hypothermia without significant neurologic sequelae. He now has a defibrillator in place and is followed by Dr. Graciela Husbands. He has a history of hypercholesterolemia. His last nuclear stress test was 10/24/11 and showed an ejection fraction of 45% and a large posterolateral scar but no ischemia. His last echocardiogram was in 2009 and showed an ejection fraction of 35-40%. The patient has a known abdominal aortic aneurysm followed by Dr. Arbie Cookey. His most recent ultrasound showed that the aneurysm had increased in size from 4.5 up to 4.8 cm so he will now be checked at six-month intervals. The patient has his defibrillator monitored through Dr. Odessa Fleming office. In January 2013 he had 2 episodes of ventricular tachycardia which were asymptomatic.  In March 2014 he was rehospitalized after having 3 consecutive shocks from his defibrillator. He underwent cardiac catheterization by Dr. Excell Seltzer on 02/28/13 who told him that his arteries have not shown any progression of atherosclerosis since his previous cardiac catheterization in 2009. Of note is the fact that one day prior to his defibrillator shocks he had used some eye drops which may have had an epinephrine like effect on his body.  Since then he has had no further defibrillator discharges.  He has had a stressful summer because of  family illness.  His mother is a patient in one of the nursing homes here and one of their daughters has been chronically ill in [place].  Another daughter recently had electrodes implanted in her scalp for treatment of intractable migraine headaches.  The procedure was done in [facility].   Current Outpatient Prescriptions  Medication Sig Dispense Refill  . aspirin 81 MG tablet Take 81 mg by mouth daily.        Marland Kitchen atenolol (TENORMIN) 25 MG tablet Take 0.5 tablets (12.5 mg total) by mouth 2 (two) times daily.  90 tablet  3  . Cholecalciferol (VITAMIN D-3 PO) Take 300 mg by mouth daily.       . colesevelam (WELCHOL) 625 MG tablet Take 1 tablet (625 mg total) by mouth 2 (two) times daily with a meal.      . fish oil-omega-3 fatty acids 1000 MG capsule Take 1 g by mouth 2 (two) times daily.      . Glucosamine Sulfate-MSM (GLUCOSAMINE-MSM DS PO) Take 1 tablet by mouth daily.      Marland Kitchen ibuprofen (ADVIL,MOTRIN) 200 MG tablet Take 400 mg by mouth every 8 (eight) hours as needed for pain.      Marland Kitchen lisinopril (PRINIVIL,ZESTRIL) 2.5 MG tablet Take 1 tablet (2.5 mg total) by mouth daily.  90 tablet  3  . loratadine (CLARITIN) 10 MG tablet Take 10 mg by mouth daily.      . Multiple Vitamin (MULTIVITAMIN WITH MINERALS) TABS Take 1 tablet by mouth daily.      . nitroGLYCERIN (NITRODUR -  DOSED IN MG/24 HR) 0.2 mg/hr Place 1 patch (0.2 mg total) onto the skin daily.  90 patch  3  . nitroGLYCERIN (NITROSTAT) 0.4 MG SL tablet Place 1 tablet (0.4 mg total) under the tongue every 5 (five) minutes as needed.  25 tablet  3  . rosuvastatin (CRESTOR) 5 MG tablet 1 tablet every other day  45 tablet  3   No current facility-administered medications for this visit.    Allergies  Allergen Reactions  . Losartan Potassium Palpitations  . Clopidogrel Bisulfate Hives  . Epinephrine   . Iodine Hives    "Renografin"  . Metoprolol Other (See Comments)    Unknown   . Mydriacyl [Tropicamide]   . Phenylephrine Hcl   .  Statins Other (See Comments)    Muscle cramps    Patient Active Problem List   Diagnosis Date Noted  . CORONARY ATHEROSCLEROSIS 08/10/2009    Priority: High  . PVC's (premature ventricular contractions) 05/10/2011    Priority: Medium  . HYPERLIPIDEMIA, MIXED 08/10/2009    Priority: Medium  . Implantable defibrillator check 09/25/2013  . Weight gain 12/24/2012  . Sinusitis 12/24/2012  . Abdominal aneurysm without mention of rupture 11/13/2012  . Ischemic cardiomyopathy 08/28/2012  . Paroxysmal ventricular tachycardia 03/22/2012  . Single implantable cardioverter-defibrillator [employer] 03/22/2012  . Abdominal aortic aneurysm 05/10/2011  . VENTRICULAR FIBRILLATION 08/10/2009    History  Smoking status  . Former Smoker -- 1.00 packs/day for 20 years  . Types: Cigarettes  . Quit date: 12/23/1979  Smokeless tobacco  . Never Used    History  Alcohol Use No    Family History  Problem Relation Age of Onset  . Coronary artery disease      strong family history of   . Hyperlipidemia Mother   . Heart disease Father     before age 47  . Hyperlipidemia Father     Review of Systems: Constitutional: no fever chills diaphoresis or fatigue or change in weight.  Head and neck: no hearing loss, no epistaxis, no photophobia or visual disturbance. Respiratory: No cough, shortness of breath or wheezing. Cardiovascular: No chest pain peripheral edema, palpitations. Gastrointestinal: No abdominal distention, no abdominal pain, no change in bowel habits hematochezia or melena. Genitourinary: No dysuria, no frequency, no urgency, no nocturia. Musculoskeletal:No arthralgias, no back pain, no gait disturbance or myalgias. Neurological: No dizziness, no headaches, no numbness, no seizures, no syncope, no weakness, no tremors. Hematologic: No lymphadenopathy, no easy bruising. Psychiatric: No confusion, no hallucinations, no sleep disturbance.    Physical Exam: Filed Vitals:   10/28/13  1153  BP: 154/78  Pulse: 68   the general appearance reveals a well-developed well-nourished Caucasian gentleman in no distress.The head and neck exam reveals pupils equal and reactive.  Extraocular movements are full.  There is no scleral icterus.  The mouth and pharynx are normal.  The neck is supple.  The carotids reveal no bruits.  The jugular venous pressure is normal.  The  thyroid is not enlarged.  There is no lymphadenopathy.  The chest is clear to percussion and auscultation.  There are no rales or rhonchi.  Expansion of the chest is symmetrical.  The precordium is quiet.  The first heart sound is normal.  The second heart sound is physiologically split.  There is no murmur gallop rub or click.  There is no abnormal lift or heave.  The abdomen is soft and nontender.  The bowel sounds are normal.  The liver and spleen  are not enlarged.  There are no abdominal masses.  There are no abdominal bruits.  Extremities reveal good pedal pulses.  There is no phlebitis or edema.  There is no cyanosis or clubbing.  Strength is normal and symmetrical in all extremities.  There is no lateralizing weakness.  There are no sensory deficits.  The skin is warm and dry.  There is no rash.     Assessment / Plan: Continue on same medication except decrease WelChol further.  Continue same dose of Crestor.  Recheck in 4 months for office visit and fasting lab

## 2013-10-28 NOTE — Assessment & Plan Note (Addendum)
Blood work is satisfactory on current regimen.  Welchol is very expensive for him and we will try cutting back to just one tablet twice a day

## 2013-10-28 NOTE — Patient Instructions (Signed)
Your physician has recommended you make the following change in your medication:  1) Decrease welchol to one tablet twice daily  Your physician recommends that you return for FASTING lab work in: 4 months (lipid/liver/bmp)  Your physician recommends that you schedule a follow-up appointment in: 4 months with Dr. Patty Sermons.

## 2013-11-04 ENCOUNTER — Encounter: Payer: Self-pay | Admitting: Vascular Surgery

## 2013-11-05 ENCOUNTER — Other Ambulatory Visit (HOSPITAL_COMMUNITY): Payer: Medicare Other

## 2013-11-05 ENCOUNTER — Encounter (INDEPENDENT_AMBULATORY_CARE_PROVIDER_SITE_OTHER): Payer: Self-pay

## 2013-11-05 ENCOUNTER — Ambulatory Visit (INDEPENDENT_AMBULATORY_CARE_PROVIDER_SITE_OTHER): Payer: Medicare Other | Admitting: Vascular Surgery

## 2013-11-05 ENCOUNTER — Ambulatory Visit (HOSPITAL_COMMUNITY)
Admission: RE | Admit: 2013-11-05 | Discharge: 2013-11-05 | Disposition: A | Discharge status: Home / Self Care | Payer: Medicare Other | Source: Ambulatory Visit | Attending: Vascular Surgery | Admitting: Vascular Surgery

## 2013-11-05 ENCOUNTER — Encounter: Payer: Self-pay | Admitting: Vascular Surgery

## 2013-11-05 VITALS — BP 145/84 | HR 56 | Resp 16 | Ht 69.0 in | Wt 178.0 lb

## 2013-11-05 DIAGNOSIS — I714 Abdominal aortic aneurysm, without rupture, unspecified: Secondary | ICD-10-CM

## 2013-11-05 NOTE — Progress Notes (Signed)
Patient name: Joel Lin MRN: [id number] DOB: July 24, 1941 Sex: male     Reason for referral:  Chief Complaint  Patient presents with  . AAA    6 month f/u  lab study today    HISTORY OF PRESENT ILLNESS: The patient presents today for continued discussion of his infrarenal abdominal aortic aneurysm. This is been followed in our office office with serial ultrasounds. He is here today for a repeat study. He has no symptoms referable to his aneurysm. He reports that he is concerned since he does since they pulsation in his abdomen was particularly at night. He has no tenderness and no symptoms related to this. Does have significant cardiac history with myocardial infarction in 1981. He does have a mechanical defibrillator in place. He has no symptoms of congestive heart failure. Otherwise is quite active.  Past Medical History  Diagnosis Date  . Coronary artery disease     myoview 2011>>EF of 53% / large area of old inferolateral infarct but no reversible ischemia          . Cardiac arrest 03/29/2008    at home without resuscitation anywhere from 7-10 minutes before EMS arrived/ The patient was noted to be in ventricular fibrillation  required defibrillation x3               . Hypercoagulable state     Questionable history of a borderline hypercoagulable state  . Hyperlipidemia   . Anoxic brain damage      resolved  . Respiratory failure 2009    Respiratory failure and pulmonary collapse requiring intubation  . 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 PVCs (premature ventricular contractions)     occasionally  . Peripheral vascular disease   . Myocardial infarction   . AAA (abdominal aortic aneurysm)   . Contrast media allergy     Past Surgical History  Procedure Laterality Date  . Cholecystectomy, laparoscopic  08/13/2009    Calculous cholecystitis  . Insert / replace / remove pacemaker  04/07/2008      cardioverter defibrillator, [employer] Single-chamber defibrillator implantation with intraoperative defibrillation threshold testing /  Durata 7121 dual-coil active defibrillator lead, serial  #[id number]   . Cardiac catheterization  04/04/2008     LAD stent patent, D1 OK, OM1 90/80/80% (2mm vessel), RCA 30%; EF 35%.  . Coronary angioplasty with stent placement  1999    PTCA and stenting of his left anterior descending artery  . Cholecystectomy      History   Social History  . Marital Status: Married    Spouse Name: N/A    Number of Children: N/A  . Years of Education: N/A   Occupational History  . Retired Runner, broadcasting/film/video    Social History Main Topics  . Smoking status: Former Smoker -- 1.00 packs/day for 20 years    Types: Cigarettes    Quit date: 12/23/1979  . Smokeless tobacco: Never Used  . Alcohol Use: No  . Drug Use: No  . Sexual Activity: Not on file   Other Topics Concern  . Not on file   Social History Narrative  . No narrative on file    Family History  Problem Relation Age of Onset  . Coronary artery disease      strong family history of   . Hyperlipidemia Mother   . Heart disease Father     before age 43  . Hyperlipidemia Father     Allergies as of  11/05/2013 - Review Complete 11/05/2013  Allergen Reaction Noted  . Losartan potassium Palpitations 12/24/2012  . Clopidogrel bisulfate Hives 01/24/2011  . Epinephrine  04/16/2013  . Iodine Hives   . Metoprolol Other (See Comments) 08/28/2012  . Mydriacyl [tropicamide]  04/16/2013  . Phenylephrine hcl  04/16/2013  . Statins Other (See Comments) 08/28/2012    Current Outpatient Prescriptions on File Prior to Visit  Medication Sig Dispense Refill  . aspirin 81 MG tablet Take 81 mg by mouth daily.        Marland Kitchen atenolol (TENORMIN) 25 MG tablet Take 0.5 tablets (12.5 mg total) by mouth 2 (two) times daily.  90 tablet  3  . Cholecalciferol (VITAMIN D-3 PO) Take 300 mg by mouth daily.       . colesevelam (WELCHOL) 625 MG tablet Take 1 tablet (625 mg  total) by mouth 2 (two) times daily with a meal.      . fish oil-omega-3 fatty acids 1000 MG capsule Take 1 g by mouth 2 (two) times daily.      . Glucosamine Sulfate-MSM (GLUCOSAMINE-MSM DS PO) Take 1 tablet by mouth daily.      Marland Kitchen ibuprofen (ADVIL,MOTRIN) 200 MG tablet Take 400 mg by mouth every 8 (eight) hours as needed for pain.      Marland Kitchen lisinopril (PRINIVIL,ZESTRIL) 2.5 MG tablet Take 1 tablet (2.5 mg total) by mouth daily.  90 tablet  3  . loratadine (CLARITIN) 10 MG tablet Take 10 mg by mouth daily.      . Multiple Vitamin (MULTIVITAMIN WITH MINERALS) TABS Take 1 tablet by mouth daily.      . nitroGLYCERIN (NITRODUR - DOSED IN MG/24 HR) 0.2 mg/hr Place 1 patch (0.2 mg total) onto the skin daily.  90 patch  3  . nitroGLYCERIN (NITROSTAT) 0.4 MG SL tablet Place 1 tablet (0.4 mg total) under the tongue every 5 (five) minutes as needed.  25 tablet  3  . rosuvastatin (CRESTOR) 5 MG tablet 1 tablet every other day  45 tablet  3   No current facility-administered medications on file prior to visit.     REVIEW OF SYSTEMS: Negative aside from history of present illness PHYSICAL EXAMINATION:  General: The patient is a well-nourished male, in no acute distress. Vital signs are BP 145/84  Pulse 56  Resp 16  Ht 5\' 9"  (1.753 m)  Wt 178 lb (80.74 kg)  BMI 26.27 kg/m2 Pulmonary: There is a good air exchange bilaterally without wheezing or rales. Abdomen: Soft and non-tender with normal pitch bowel sounds. Common aortic pulsation with no tenderness Musculoskeletal: There are no major deformities.  There is no significant extremity pain. Neurologic: No focal weakness or paresthesias are detected, Skin: There are no ulcer or rashes noted. Psychiatric: The patient has normal affect. Cardiovascular: There is a regular rate and rhythm without significant murmur appreciated. Carotid arteries without bruits bilaterally Pulse status 2+ radial 2+ femoral and 2+ dorsalis pedis pulses bilaterally  VVS  Vascular Lab Studies:  Ordered and Independently Reviewed size increased from a maximal diameter of 4.8 cm in June of 2014 today's study of 5.1 cm.  Impression and Plan:  Millimeter expansion in 6 months of a now 5.1 cm infrarenal abdominal aortic aneurysm. I explained that he is at the threshold for recommendation of elective repair. I have recommended CT angiogram for further evaluation determine if he is a stent graft candidate. He does have a long history of hives associated with cardiac cath in 1981. He does have  some issues regarding steroid treatment and his defibrillator with elevated heart rate is well. He does not have any history of anaphylactic reaction. We will proceed with a CT angiogram of his abdomen and pelvis pretreatment with Benadryl only. We will see him back for discussion following this. I did discuss open and stent graft repair of aneurysms and the issues regarding each. We'll make further recommendations pending his CT angiogram    Tammatha Cobb [facility] Office: [phone]

## 2013-11-05 NOTE — Addendum Note (Signed)
Addended by: Melodye Ped C on: 11/05/2013 11:29 AM   Modules accepted: Orders

## 2013-11-06 ENCOUNTER — Other Ambulatory Visit: Payer: Self-pay | Admitting: Vascular Surgery

## 2013-11-06 LAB — CREATININE, SERUM: Creat: 0.82 mg/dL (ref 0.50–1.35)

## 2013-11-06 LAB — BUN: BUN: 21 mg/dL (ref 6–23)

## 2013-11-19 ENCOUNTER — Ambulatory Visit: Payer: Medicare Other | Admitting: Vascular Surgery

## 2013-11-19 ENCOUNTER — Inpatient Hospital Stay: Admission: RE | Admit: 2013-11-19 | Payer: Medicare Other | Source: Ambulatory Visit

## 2013-11-25 ENCOUNTER — Encounter: Payer: Self-pay | Admitting: Vascular Surgery

## 2013-11-26 ENCOUNTER — Encounter: Payer: Self-pay | Admitting: Vascular Surgery

## 2013-11-26 ENCOUNTER — Ambulatory Visit
Admission: RE | Admit: 2013-11-26 | Discharge: 2013-11-26 | Disposition: A | Discharge status: Home / Self Care | Payer: Medicare Other | Source: Ambulatory Visit | Attending: Vascular Surgery | Admitting: Vascular Surgery

## 2013-11-26 ENCOUNTER — Ambulatory Visit: Payer: Medicare Other | Admitting: Vascular Surgery

## 2013-11-26 ENCOUNTER — Ambulatory Visit (INDEPENDENT_AMBULATORY_CARE_PROVIDER_SITE_OTHER): Payer: Medicare Other | Admitting: Vascular Surgery

## 2013-11-26 VITALS — BP 136/79 | HR 86 | Resp 18 | Ht 69.0 in | Wt 184.7 lb

## 2013-11-26 DIAGNOSIS — I714 Abdominal aortic aneurysm, without rupture, unspecified: Secondary | ICD-10-CM

## 2013-11-26 MED ORDER — IOHEXOL 350 MG/ML SOLN
80.0000 mL | Freq: Once | INTRAVENOUS | Status: AC | PRN
  Administered 2013-11-26: 80 mL via INTRAVENOUS

## 2013-11-26 NOTE — Progress Notes (Signed)
Patient name: Joel Lin MRN: [id number] DOB: December 29, 1940 Sex: male     Reason for referral:  Chief Complaint  Patient presents with  . Follow-up    CTA of abdomen/pelvis  11-26-2013 at [facility]  . AAA    HISTORY OF PRESENT ILLNESS: The patient has today for continued discussion of his abdominal aortic aneurysm. He had a CT angiogram today. He was treated retreated with Benadryl and steroids has had no allergic reaction. He did have hives after cardiac catheterization in 1981. No change in his history or physical since my last visit with him.  Past Medical History  Diagnosis Date  . Coronary artery disease     myoview 2011>>EF of 53% / large area of old inferolateral infarct but no reversible ischemia          . Cardiac arrest 03/29/2008    at home without resuscitation anywhere from 7-10 minutes before EMS arrived/ The patient was noted to be in ventricular fibrillation  required defibrillation x3               . Hypercoagulable state     Questionable history of a borderline hypercoagulable state  . Hyperlipidemia   . Anoxic brain damage      resolved  . Respiratory failure 2009    Respiratory failure and pulmonary collapse requiring intubation  . 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 PVCs (premature ventricular contractions)     occasionally  . Peripheral vascular disease   . Myocardial infarction   . AAA (abdominal aortic aneurysm)   . Contrast media allergy     Past Surgical History  Procedure Laterality Date  . Cholecystectomy, laparoscopic  08/13/2009    Calculous cholecystitis  . Insert / replace / remove pacemaker  04/07/2008      cardioverter defibrillator, [employer] Jude Single-chamber defibrillator implantation with intraoperative defibrillation threshold testing /  Durata 7121 dual-coil active defibrillator lead, serial  #[id number]   . Cardiac catheterization  04/04/2008     LAD stent patent, D1 OK, OM1  90/80/80% (2mm vessel), RCA 30%; EF 35%.  . Coronary angioplasty with stent placement  1999    PTCA and stenting of his left anterior descending artery  . Cholecystectomy      History   Social History  . Marital Status: Married    Spouse Name: N/A    Number of Children: N/A  . Years of Education: N/A   Occupational History  . Retired Runner, broadcasting/film/video    Social History Main Topics  . Smoking status: Former Smoker -- 1.00 packs/day for 20 years    Types: Cigarettes    Quit date: 12/23/1979  . Smokeless tobacco: Never Used  . Alcohol Use: No  . Drug Use: No  . Sexual Activity: Not on file   Other Topics Concern  . Not on file   Social History Narrative  . No narrative on file    Family History  Problem Relation Age of Onset  . Coronary artery disease      strong family history of   . Hyperlipidemia Mother   . Heart disease Father     before age 50  . Hyperlipidemia Father     Allergies as of 11/26/2013 - Review Complete 11/26/2013  Allergen Reaction Noted  . Losartan potassium Palpitations 12/24/2012  . Clopidogrel bisulfate Hives 01/24/2011  . Epinephrine  04/16/2013  . Iodine Hives   . Metoprolol Other (See Comments) 08/28/2012  . Mydriacyl [tropicamide]  04/16/2013  . Phenylephrine hcl  04/16/2013  . Statins Other (See Comments) 08/28/2012    Current Outpatient Prescriptions on File Prior to Visit  Medication Sig Dispense Refill  . aspirin 81 MG tablet Take 81 mg by mouth daily.        Marland Kitchen atenolol (TENORMIN) 25 MG tablet Take 0.5 tablets (12.5 mg total) by mouth 2 (two) times daily.  90 tablet  3  . Cholecalciferol (VITAMIN D-3 PO) Take 300 mg by mouth daily.       . colesevelam (WELCHOL) 625 MG tablet Take 1 tablet (625 mg total) by mouth 2 (two) times daily with a meal.      . fish oil-omega-3 fatty acids 1000 MG capsule Take 1 g by mouth 2 (two) times daily.      . Glucosamine Sulfate-MSM (GLUCOSAMINE-MSM DS PO) Take 1 tablet by mouth daily.      Marland Kitchen ibuprofen  (ADVIL,MOTRIN) 200 MG tablet Take 400 mg by mouth every 8 (eight) hours as needed for pain.      Marland Kitchen lisinopril (PRINIVIL,ZESTRIL) 2.5 MG tablet Take 1 tablet (2.5 mg total) by mouth daily.  90 tablet  3  . loratadine (CLARITIN) 10 MG tablet Take 10 mg by mouth daily.      . Multiple Vitamin (MULTIVITAMIN WITH MINERALS) TABS Take 1 tablet by mouth daily.      . nitroGLYCERIN (NITRODUR - DOSED IN MG/24 HR) 0.2 mg/hr Place 1 patch (0.2 mg total) onto the skin daily.  90 patch  3  . nitroGLYCERIN (NITROSTAT) 0.4 MG SL tablet Place 1 tablet (0.4 mg total) under the tongue every 5 (five) minutes as needed.  25 tablet  3  . rosuvastatin (CRESTOR) 5 MG tablet 1 tablet every other day  45 tablet  3   No current facility-administered medications on file prior to visit.      PHYSICAL EXAMINATION:  General: The patient is a well-nourished male, in no acute distress. Vital signs are BP 136/79  Pulse 86  Resp 18  Ht 5\' 9"  (1.753 m)  Wt 184 lb 11.2 oz (83.779 kg)  BMI 27.26 kg/m2 Pulmonary: There is a good air exchange   Musculoskeletal: There are no major deformities.  There is no significant extremity pain. Neurologic: No focal weakness or paresthesias are detected, Skin: There are no ulcer or rashes noted. Psychiatric: The patient has normal affect. Cardiovascular: Palpable without cells pedis pulses bilaterally   CT scan was reviewed with the patient and his wife present. This does show 5.4 cm infrarenal abdominal aortic aneurysm. He has very favorable anatomy for stent graft repair both in his attachment sites and in his axis vessels. I discussed this at length with the patient. He is quite anxious to proceed with repair and I feel this is appropriate. We will pressure a cardiac clearance with Dr. Patty Sermons and then proceed with elective stent graft repair.  Impression and Plan:  5.4 centimeter infrarenal abdominal aortic aneurysm for stent graft repair    Sincerity C[facility] [place] Office: [phone]

## 2013-11-27 NOTE — Progress Notes (Signed)
Patient is going to have surgery for his AAA. His last lexiscan myoview was in Nov 2012. We should have him come in for a lexiscan myoview to clear him for vascular surgery.

## 2013-11-29 ENCOUNTER — Encounter: Payer: Self-pay | Admitting: Cardiology

## 2013-11-29 ENCOUNTER — Ambulatory Visit (INDEPENDENT_AMBULATORY_CARE_PROVIDER_SITE_OTHER): Payer: Medicare Other | Admitting: Cardiology

## 2013-11-29 VITALS — BP 130/66 | HR 67 | Ht 69.0 in | Wt 182.0 lb

## 2013-11-29 DIAGNOSIS — I255 Ischemic cardiomyopathy: Secondary | ICD-10-CM

## 2013-11-29 DIAGNOSIS — I4901 Ventricular fibrillation: Secondary | ICD-10-CM

## 2013-11-29 DIAGNOSIS — I472 Ventricular tachycardia, unspecified: Secondary | ICD-10-CM

## 2013-11-29 DIAGNOSIS — I4729 Other ventricular tachycardia: Secondary | ICD-10-CM

## 2013-11-29 DIAGNOSIS — E782 Mixed hyperlipidemia: Secondary | ICD-10-CM

## 2013-11-29 DIAGNOSIS — I2589 Other forms of chronic ischemic heart disease: Secondary | ICD-10-CM

## 2013-11-29 NOTE — Assessment & Plan Note (Signed)
The patient has a history of hypercholesterolemia and is on WelChol and Crestor.  He is able to tolerate only small doses of statins because of myalgias side effects.

## 2013-11-29 NOTE — Patient Instructions (Addendum)
Your physician recommends that you continue on your current medications as directed. Please refer to the Current Medication list given to you today.  Keep your March appointment  YOU ARE CLEARED FOR SURGERY

## 2013-11-29 NOTE — Assessment & Plan Note (Signed)
The patient has not been experiencing any exertional chest pain or angina 

## 2013-11-29 NOTE — Progress Notes (Signed)
Joel Lin Date of Birth:  27-Jan-1941 [address] [place], [place]  [address] [phone]         Fax   [phone]  History of Present Illness: This pleasant 72 year old gentleman is seen for a followup office visit. He has a complex past medical history. He has a history of known ischemic heart disease. He has a history of a remote myocardial infarction at the age of 5. There was a large posterolateral myocardial infarction in 1981. The patient had angioplasty and stent of his LAD in 1999. In 2009 he had an out of hospital cardiac arrest at home was successfully resuscitated and treated with hypothermia without significant neurologic sequelae. He now has a defibrillator in place and is followed by Dr. Graciela Husbands. He has a history of hypercholesterolemia. His last nuclear stress test was 10/24/11 and showed an ejection fraction of 45% and a large posterolateral scar but no ischemia. His last echocardiogram was in 2009 and showed an ejection fraction of 35-40%. The patient has a known abdominal aortic aneurysm followed by Dr. Arbie Cookey. His most recent ultrasound showed that the aneurysm had increased in size from 4.5 up to 4.8 cm so he will now be checked at six-month intervals. The patient has his defibrillator monitored through Dr. Odessa Fleming office. In January 2013 he had 2 episodes of ventricular tachycardia which were asymptomatic.  In March 2014 he was rehospitalized after having 3 consecutive shocks from his defibrillator. He underwent cardiac catheterization by Dr. Excell Seltzer on 02/28/13 who told him that his arteries have not shown any progression of atherosclerosis since his previous cardiac catheterization in 2009.  The patient has had a known abdominal aortic aneurysm which has been followed carefully by Dr. Arbie Cookey.  At his recent visit it was noted that the AAA had enlarged further and surgery is now indicated.  The patient has not been having any abdominal pain or back  pain.   Current Outpatient Prescriptions  Medication Sig Dispense Refill  . aspirin 81 MG tablet Take 81 mg by mouth daily.        Marland Kitchen atenolol (TENORMIN) 25 MG tablet Take 0.5 tablets (12.5 mg total) by mouth 2 (two) times daily.  90 tablet  3  . Cholecalciferol (VITAMIN D-3 PO) Take 300 mg by mouth daily.       . colesevelam (WELCHOL) 625 MG tablet Take 1 tablet (625 mg total) by mouth 2 (two) times daily with a meal.      . fish oil-omega-3 fatty acids 1000 MG capsule Take 1 g by mouth 2 (two) times daily.      . Glucosamine Sulfate-MSM (GLUCOSAMINE-MSM DS PO) Take 1 tablet by mouth daily.      Marland Kitchen ibuprofen (ADVIL,MOTRIN) 200 MG tablet Take 400 mg by mouth every 8 (eight) hours as needed for pain.      Marland Kitchen lisinopril (PRINIVIL,ZESTRIL) 2.5 MG tablet Take 1 tablet (2.5 mg total) by mouth daily.  90 tablet  3  . loratadine (CLARITIN) 10 MG tablet Take 10 mg by mouth daily.      . Multiple Vitamin (MULTIVITAMIN WITH MINERALS) TABS Take 1 tablet by mouth daily.      . nitroGLYCERIN (NITRODUR - DOSED IN MG/24 HR) 0.2 mg/hr Place 1 patch (0.2 mg total) onto the skin daily.  90 patch  3  . nitroGLYCERIN (NITROSTAT) 0.4 MG SL tablet Place 1 tablet (0.4 mg total) under the tongue every 5 (five) minutes as needed.  25 tablet  3  .  rosuvastatin (CRESTOR) 5 MG tablet 1 tablet every other day  45 tablet  3   No current facility-administered medications for this visit.    Allergies  Allergen Reactions  . Losartan Potassium Palpitations  . Clopidogrel Bisulfate Hives  . Epinephrine   . Iodine Hives    "Renografin"  . Metoprolol Other (See Comments)    Unknown   . Mydriacyl [Tropicamide]   . Phenylephrine Hcl   . Statins Other (See Comments)    Muscle cramps    Patient Active Problem List   Diagnosis Date Noted  . CORONARY ATHEROSCLEROSIS 08/10/2009    Priority: High  . PVC's (premature ventricular contractions) 05/10/2011    Priority: Medium  . HYPERLIPIDEMIA, MIXED 08/10/2009     Priority: Medium  . Implantable defibrillator check 09/25/2013  . Weight gain 12/24/2012  . Sinusitis 12/24/2012  . Abdominal aneurysm without mention of rupture 11/13/2012  . Ischemic cardiomyopathy 08/28/2012  . Paroxysmal ventricular tachycardia 03/22/2012  . Single implantable cardioverter-defibrillator [employer] 03/22/2012  . Abdominal aortic aneurysm 05/10/2011  . VENTRICULAR FIBRILLATION 08/10/2009    History  Smoking status  . Former Smoker -- 1.00 packs/day for 20 years  . Types: Cigarettes  . Quit date: 12/23/1979  Smokeless tobacco  . Never Used    History  Alcohol Use No    Family History  Problem Relation Age of Onset  . Coronary artery disease      strong family history of   . Hyperlipidemia Mother   . Heart disease Father     before age 48  . Hyperlipidemia Father     Review of Systems: Constitutional: no fever chills diaphoresis or fatigue or change in weight.  Head and neck: no hearing loss, no epistaxis, no photophobia or visual disturbance. Respiratory: No cough, shortness of breath or wheezing. Cardiovascular: No chest pain peripheral edema, palpitations. Gastrointestinal: No abdominal distention, no abdominal pain, no change in bowel habits hematochezia or melena. Genitourinary: No dysuria, no frequency, no urgency, no nocturia. Musculoskeletal:No arthralgias, no back pain, no gait disturbance or myalgias. Neurological: No dizziness, no headaches, no numbness, no seizures, no syncope, no weakness, no tremors. Hematologic: No lymphadenopathy, no easy bruising. Psychiatric: No confusion, no hallucinations, no sleep disturbance.    Physical Exam: Filed Vitals:   11/29/13 1421  BP: 130/66  Pulse: 67   the general appearance reveals a well-developed well-nourished Caucasian gentleman in no distress.The head and neck exam reveals pupils equal and reactive.  Extraocular movements are full.  There is no scleral icterus.  The mouth and pharynx are  normal.  The neck is supple.  The carotids reveal no bruits.  The jugular venous pressure is normal.  The  thyroid is not enlarged.  There is no lymphadenopathy.  The chest is clear to percussion and auscultation.  There are no rales or rhonchi.  Expansion of the chest is symmetrical.  The precordium is quiet.  The first heart sound is normal.  The second heart sound is physiologically split.  There is no murmur gallop rub or click.  There is no abnormal lift or heave.  The abdomen is soft and nontender.  The bowel sounds are normal.  The liver and spleen are not enlarged.  There are no abdominal masses.  There are no abdominal bruits.  Extremities reveal good pedal pulses.  There is no phlebitis or edema.  There is no cyanosis or clubbing.  Strength is normal and symmetrical in all extremities.  There is no lateralizing weakness.  There are no sensory deficits.  The skin is warm and dry.  There is no rash.  EKG shows normal sinus rhythm with first degree AV block and a pattern of an old inferior posterior myocardial infarction.   Assessment / Plan: The patient is cleared from a cardiac standpoint for AAA repair. He will return here for his regular office visit followup in several months.

## 2013-11-29 NOTE — Assessment & Plan Note (Signed)
The patient has had no recent shocks from his defibrillator.  He has not been aware of any palpitations or cardiac irregularities.  EKG today shows normal sinus rhythm with first degree AV block and is unchanged from prior tracing of 09/25/13

## 2013-12-02 ENCOUNTER — Other Ambulatory Visit: Payer: Self-pay | Admitting: *Deleted

## 2013-12-03 ENCOUNTER — Encounter (HOSPITAL_COMMUNITY): Payer: Self-pay | Admitting: Pharmacy Technician

## 2013-12-03 NOTE — Pre-Procedure Instructions (Addendum)
Joel Lin  12/03/2013   Your procedure is scheduled on Friday, December 26th.               Report to [facility], Main Entrance [facility] "A" at 5:30AM.  Call this number if you have problems the morning of surgery: [phone]   Remember:   Do not eat food or drink liquids after midnight.   Take these medicines the morning of surgery with A SIP OF WATER: atenolol (TENORMIN),loratadine (CLARITIN).    Do not wear jewelry, make-up or nail polish.  Do not wear lotions, powders, or perfumes. You may wear deodorant.   Men may shave face and neck.  Do not bring valuables to the hospital.  [facility] is not responsible for any belongings or valuables.               Contacts, dentures or bridgework may not be worn into surgery.  Leave suitcase in the car. After surgery it may be brought to your room.  For patients admitted to the hospital, discharge time is determined by your  treatment team.               Special Instructions: Shower using CHG 2 nights before surgery and the night before surgery.  If you shower the day of surgery use CHG.  Use special wash - you have one bottle of CHG for all showers.  You should use approximately 1/3 of the bottle for each shower.   Please read over the following fact sheets that you were given: Pain Booklet, Coughing and Deep Breathing, Blood Transfusion Information and Surgical Site Infection Prevention

## 2013-12-04 ENCOUNTER — Encounter (HOSPITAL_COMMUNITY): Payer: Self-pay

## 2013-12-04 ENCOUNTER — Encounter (HOSPITAL_COMMUNITY)
Admission: RE | Admit: 2013-12-04 | Discharge: 2013-12-04 | Disposition: A | Discharge status: Home / Self Care | Payer: Medicare Other | Source: Ambulatory Visit | Attending: Anesthesiology | Admitting: Anesthesiology

## 2013-12-04 ENCOUNTER — Encounter (HOSPITAL_COMMUNITY)
Admission: RE | Admit: 2013-12-04 | Discharge: 2013-12-04 | Disposition: A | Discharge status: Home / Self Care | Payer: Medicare Other | Source: Ambulatory Visit | Attending: Vascular Surgery | Admitting: Vascular Surgery

## 2013-12-04 HISTORY — DX: Unspecified hemorrhoids: K64.9

## 2013-12-04 LAB — BLOOD GAS, ARTERIAL
Acid-Base Excess: 0.8 mmol/L (ref 0.0–2.0)
Drawn by: 18160
FIO2: 0.21 %
O2 Saturation: 96.5 %
TCO2: 26.3 mmol/L (ref 0–100)
pCO2 arterial: 41 mmHg (ref 35.0–45.0)
pO2, Arterial: 84.8 mmHg (ref 80.0–100.0)

## 2013-12-04 LAB — CBC
HCT: 44.8 % (ref 39.0–52.0)
Hemoglobin: 15.3 g/dL (ref 13.0–17.0)
MCH: 28.9 pg (ref 26.0–34.0)
MCV: 84.5 fL (ref 78.0–100.0)
RBC: 5.3 MIL/uL (ref 4.22–5.81)
WBC: 6.5 10*3/uL (ref 4.0–10.5)

## 2013-12-04 LAB — PROTIME-INR
INR: 1.01 (ref 0.00–1.49)
Prothrombin Time: 13.1 seconds (ref 11.6–15.2)

## 2013-12-04 LAB — COMPREHENSIVE METABOLIC PANEL
BUN: 18 mg/dL (ref 6–23)
CO2: 23 mEq/L (ref 19–32)
Calcium: 9.1 mg/dL (ref 8.4–10.5)
Chloride: 104 mEq/L (ref 96–112)
Creatinine, Ser: 0.81 mg/dL (ref 0.50–1.35)
GFR calc Af Amer: >90 mL/min (ref >90)
GFR calc non Af Amer: 87 mL/min — ABNORMAL LOW (ref >90)
Glucose, Bld: 102 mg/dL — ABNORMAL HIGH (ref 70–99)
Total Bilirubin: 0.4 mg/dL (ref 0.3–1.2)

## 2013-12-04 LAB — TYPE AND SCREEN: ABO/RH(D): A POS

## 2013-12-04 LAB — SURGICAL PCR SCREEN: MRSA, PCR: NEGATIVE

## 2013-12-04 LAB — APTT: aPTT: 30 seconds (ref 24–37)

## 2013-12-05 MED ORDER — DEXTROSE 5 % IV SOLN
1.5000 g | INTRAVENOUS | Status: AC
  Administered 2013-12-06: 1.5 g via INTRAVENOUS
  Filled 2013-12-05: qty 1.5

## 2013-12-05 MED ORDER — SODIUM CHLORIDE 0.9 % IV SOLN: INTRAVENOUS | Status: DC

## 2013-12-06 ENCOUNTER — Other Ambulatory Visit: Payer: Self-pay | Admitting: *Deleted

## 2013-12-06 ENCOUNTER — Other Ambulatory Visit: Payer: Self-pay

## 2013-12-06 ENCOUNTER — Encounter (HOSPITAL_COMMUNITY): Payer: Medicare Other | Admitting: Anesthesiology

## 2013-12-06 ENCOUNTER — Inpatient Hospital Stay (HOSPITAL_COMMUNITY): Payer: Medicare Other | Admitting: Anesthesiology

## 2013-12-06 ENCOUNTER — Inpatient Hospital Stay (HOSPITAL_COMMUNITY)
Admission: RE | Admit: 2013-12-06 | Discharge: 2013-12-07 | DRG: 238 | Disposition: A | Discharge status: Home / Self Care | Payer: Medicare Other | Source: Ambulatory Visit | Attending: Vascular Surgery | Admitting: Vascular Surgery

## 2013-12-06 ENCOUNTER — Encounter (HOSPITAL_COMMUNITY): Payer: Self-pay | Admitting: Anesthesiology

## 2013-12-06 ENCOUNTER — Encounter (HOSPITAL_COMMUNITY)
Admission: RE | Disposition: A | Discharge status: Home / Self Care | Payer: Self-pay | Source: Ambulatory Visit | Attending: Vascular Surgery

## 2013-12-06 ENCOUNTER — Inpatient Hospital Stay (HOSPITAL_COMMUNITY): Payer: Medicare Other

## 2013-12-06 DIAGNOSIS — I714 Abdominal aortic aneurysm, without rupture, unspecified: Secondary | ICD-10-CM

## 2013-12-06 DIAGNOSIS — Z01818 Encounter for other preprocedural examination: Secondary | ICD-10-CM

## 2013-12-06 DIAGNOSIS — Z7982 Long term (current) use of aspirin: Secondary | ICD-10-CM

## 2013-12-06 DIAGNOSIS — I252 Old myocardial infarction: Secondary | ICD-10-CM

## 2013-12-06 DIAGNOSIS — Z01812 Encounter for preprocedural laboratory examination: Secondary | ICD-10-CM

## 2013-12-06 DIAGNOSIS — E785 Hyperlipidemia, unspecified: Secondary | ICD-10-CM | POA: Diagnosis present

## 2013-12-06 DIAGNOSIS — Z9861 Coronary angioplasty status: Secondary | ICD-10-CM

## 2013-12-06 DIAGNOSIS — Z91041 Radiographic dye allergy status: Secondary | ICD-10-CM

## 2013-12-06 DIAGNOSIS — R000 Tachycardia, unspecified: Secondary | ICD-10-CM | POA: Diagnosis not present

## 2013-12-06 DIAGNOSIS — I739 Peripheral vascular disease, unspecified: Secondary | ICD-10-CM | POA: Diagnosis present

## 2013-12-06 DIAGNOSIS — Z888 Allergy status to other drugs, medicaments and biological substances status: Secondary | ICD-10-CM

## 2013-12-06 DIAGNOSIS — Z87891 Personal history of nicotine dependence: Secondary | ICD-10-CM

## 2013-12-06 DIAGNOSIS — Z8249 Family history of ischemic heart disease and other diseases of the circulatory system: Secondary | ICD-10-CM

## 2013-12-06 DIAGNOSIS — I2510 Atherosclerotic heart disease of native coronary artery without angina pectoris: Secondary | ICD-10-CM | POA: Diagnosis present

## 2013-12-06 DIAGNOSIS — Z95810 Presence of automatic (implantable) cardiac defibrillator: Secondary | ICD-10-CM

## 2013-12-06 DIAGNOSIS — Z48812 Encounter for surgical aftercare following surgery on the circulatory system: Secondary | ICD-10-CM

## 2013-12-06 HISTORY — PX: ABDOMINAL AORTIC ENDOVASCULAR STENT GRAFT: SHX5707

## 2013-12-06 LAB — URINALYSIS, ROUTINE W REFLEX MICROSCOPIC
Bilirubin Urine: NEGATIVE
Glucose, UA: NEGATIVE mg/dL
Ketones, ur: NEGATIVE mg/dL
Nitrite: NEGATIVE
Protein, ur: NEGATIVE mg/dL
Specific Gravity, Urine: 1.015 (ref 1.005–1.030)
Urobilinogen, UA: 0.2 mg/dL (ref 0.0–1.0)
pH: 5.5 (ref 5.0–8.0)

## 2013-12-06 LAB — PROTIME-INR
INR: 1.14 (ref 0.00–1.49)
Prothrombin Time: 14.4 seconds (ref 11.6–15.2)

## 2013-12-06 LAB — CBC
HCT: 43.4 % (ref 39.0–52.0)
Hemoglobin: 14.4 g/dL (ref 13.0–17.0)
MCH: 28.6 pg (ref 26.0–34.0)
MCH: 28.6 pg (ref 26.0–34.0)
MCHC: 33.6 g/dL (ref 30.0–36.0)
MCV: 84.9 fL (ref 78.0–100.0)
MCV: 85.1 fL (ref 78.0–100.0)
Platelets: 120 10*3/uL — ABNORMAL LOW (ref 150–400)
Platelets: 125 10*3/uL — ABNORMAL LOW (ref 150–400)
RBC: 5.03 MIL/uL (ref 4.22–5.81)
RBC: 5.1 MIL/uL (ref 4.22–5.81)
RDW: 13.1 % (ref 11.5–15.5)
WBC: 9.9 10*3/uL (ref 4.0–10.5)
WBC: 10.6 10*3/uL — ABNORMAL HIGH (ref 4.0–10.5)

## 2013-12-06 LAB — BASIC METABOLIC PANEL
CO2: 25 mEq/L (ref 19–32)
Calcium: 8.8 mg/dL (ref 8.4–10.5)
Creatinine, Ser: 0.76 mg/dL (ref 0.50–1.35)
Glucose, Bld: 145 mg/dL — ABNORMAL HIGH (ref 70–99)
Potassium: 3.8 mEq/L (ref 3.5–5.1)
Sodium: 136 mEq/L (ref 135–145)

## 2013-12-06 LAB — APTT: aPTT: 32 seconds (ref 24–37)

## 2013-12-06 LAB — URINE MICROSCOPIC-ADD ON

## 2013-12-06 SURGERY — INSERTION, ENDOVASCULAR STENT GRAFT, AORTA, ABDOMINAL
Anesthesia: General | Site: Abdomen

## 2013-12-06 MED ORDER — OMEGA-3 FATTY ACIDS 1000 MG PO CAPS: 1.0000 g | ORAL_CAPSULE | Freq: Every day | ORAL | Status: DC

## 2013-12-06 MED ORDER — NITROGLYCERIN 0.2 MG/HR TD PT24
0.2000 mg | MEDICATED_PATCH | Freq: Every day | TRANSDERMAL | Status: DC
  Administered 2013-12-06 – 2013-12-07 (×2): 0.2 mg via TRANSDERMAL
  Filled 2013-12-06 (×2): qty 1

## 2013-12-06 MED ORDER — SODIUM CHLORIDE 0.9 % IR SOLN
Status: DC | PRN
  Administered 2013-12-06: 09:00:00

## 2013-12-06 MED ORDER — MIDAZOLAM HCL 5 MG/5ML IJ SOLN
INTRAMUSCULAR | Status: DC | PRN
  Administered 2013-12-06: 2 mg via INTRAVENOUS

## 2013-12-06 MED ORDER — PROPOFOL 10 MG/ML IV BOLUS
INTRAVENOUS | Status: DC | PRN
  Administered 2013-12-06: 100 mg via INTRAVENOUS

## 2013-12-06 MED ORDER — HEPARIN SODIUM (PORCINE) 1000 UNIT/ML IJ SOLN
INTRAMUSCULAR | Status: DC | PRN
  Administered 2013-12-06: 6000 [IU] via INTRAVENOUS

## 2013-12-06 MED ORDER — OXYCODONE HCL 5 MG PO TABS: 5.0000 mg | ORAL_TABLET | Freq: Four times a day (QID) | ORAL | Status: DC | PRN

## 2013-12-06 MED ORDER — COLESEVELAM HCL 625 MG PO TABS
625.0000 mg | ORAL_TABLET | Freq: Two times a day (BID) | ORAL | Status: DC
Start: 2013-12-06 — End: 2013-12-07
  Administered 2013-12-06 – 2013-12-07 (×2): 625 mg via ORAL
  Filled 2013-12-06 (×4): qty 1

## 2013-12-06 MED ORDER — DIPHENHYDRAMINE HCL 25 MG PO CAPS: 25.0000 mg | ORAL_CAPSULE | Freq: Every evening | ORAL | Status: DC | PRN

## 2013-12-06 MED ORDER — POTASSIUM CHLORIDE CRYS ER 20 MEQ PO TBCR: 20.0000 meq | EXTENDED_RELEASE_TABLET | Freq: Every day | ORAL | Status: DC | PRN

## 2013-12-06 MED ORDER — DOCUSATE SODIUM 100 MG PO CAPS: 100.0000 mg | ORAL_CAPSULE | Freq: Every day | ORAL | Status: DC

## 2013-12-06 MED ORDER — LORATADINE 10 MG PO TABS
10.0000 mg | ORAL_TABLET | Freq: Every day | ORAL | Status: DC
  Administered 2013-12-07: 10 mg via ORAL
  Filled 2013-12-06: qty 1

## 2013-12-06 MED ORDER — GUAIFENESIN-DM 100-10 MG/5ML PO SYRP: 15.0000 mL | ORAL_SOLUTION | ORAL | Status: DC | PRN

## 2013-12-06 MED ORDER — PANTOPRAZOLE SODIUM 40 MG PO TBEC
40.0000 mg | DELAYED_RELEASE_TABLET | Freq: Every day | ORAL | Status: DC
  Administered 2013-12-06: 40 mg via ORAL
  Filled 2013-12-06: qty 1

## 2013-12-06 MED ORDER — LACTATED RINGERS IV SOLN
INTRAVENOUS | Status: DC | PRN
  Administered 2013-12-06 (×2): via INTRAVENOUS

## 2013-12-06 MED ORDER — NEOSTIGMINE METHYLSULFATE 1 MG/ML IJ SOLN
INTRAMUSCULAR | Status: DC | PRN
  Administered 2013-12-06: 4 mg via INTRAVENOUS

## 2013-12-06 MED ORDER — ASPIRIN 81 MG PO CHEW
81.0000 mg | CHEWABLE_TABLET | Freq: Every day | ORAL | Status: DC
  Administered 2013-12-06: 81 mg via ORAL
  Filled 2013-12-06 (×2): qty 1

## 2013-12-06 MED ORDER — ZOLPIDEM TARTRATE 5 MG PO TABS
5.0000 mg | ORAL_TABLET | Freq: Every evening | ORAL | Status: DC | PRN
  Administered 2013-12-06: 5 mg via ORAL
  Filled 2013-12-06: qty 1

## 2013-12-06 MED ORDER — HYDROMORPHONE HCL PF 1 MG/ML IJ SOLN
INTRAMUSCULAR | Status: AC
  Administered 2013-12-06: 0.25 mg via INTRAVENOUS
  Filled 2013-12-06: qty 1

## 2013-12-06 MED ORDER — ATENOLOL 12.5 MG HALF TABLET
12.5000 mg | ORAL_TABLET | Freq: Two times a day (BID) | ORAL | Status: DC
  Administered 2013-12-06 – 2013-12-07 (×2): 12.5 mg via ORAL
  Filled 2013-12-06 (×3): qty 1

## 2013-12-06 MED ORDER — DEXTROSE 5 % IV SOLN
1.5000 g | Freq: Two times a day (BID) | INTRAVENOUS | Status: AC
  Administered 2013-12-06 – 2013-12-07 (×2): 1.5 g via INTRAVENOUS
  Filled 2013-12-06 (×2): qty 1.5

## 2013-12-06 MED ORDER — ALUM & MAG HYDROXIDE-SIMETH 200-200-20 MG/5ML PO SUSP: 15.0000 mL | ORAL | Status: DC | PRN

## 2013-12-06 MED ORDER — DILTIAZEM HCL 100 MG IV SOLR
5.0000 mg/h | INTRAVENOUS | Status: DC
  Administered 2013-12-06: 10 mg/h via INTRAVENOUS
  Administered 2013-12-06: 5 mg/h via INTRAVENOUS
  Filled 2013-12-06: qty 100

## 2013-12-06 MED ORDER — MORPHINE SULFATE 2 MG/ML IJ SOLN: 2.0000 mg | INTRAMUSCULAR | Status: DC | PRN

## 2013-12-06 MED ORDER — ADULT MULTIVITAMIN W/MINERALS CH
1.0000 | ORAL_TABLET | Freq: Every day | ORAL | Status: DC
  Administered 2013-12-06 – 2013-12-07 (×2): 1 via ORAL
  Filled 2013-12-06 (×2): qty 1

## 2013-12-06 MED ORDER — HYDRALAZINE HCL 20 MG/ML IJ SOLN: 10.0000 mg | INTRAMUSCULAR | Status: DC | PRN

## 2013-12-06 MED ORDER — FENTANYL CITRATE 0.05 MG/ML IJ SOLN: 25.0000 ug | INTRAMUSCULAR | Status: DC | PRN

## 2013-12-06 MED ORDER — DIPHENHYDRAMINE HCL 50 MG/ML IJ SOLN
INTRAMUSCULAR | Status: DC | PRN
  Administered 2013-12-06: 50 mg via INTRAVENOUS

## 2013-12-06 MED ORDER — LISINOPRIL 2.5 MG PO TABS
2.5000 mg | ORAL_TABLET | Freq: Every day | ORAL | Status: DC
  Administered 2013-12-06: 2.5 mg via ORAL
  Filled 2013-12-06 (×2): qty 1

## 2013-12-06 MED ORDER — ONDANSETRON HCL 4 MG/2ML IJ SOLN: 4.0000 mg | Freq: Four times a day (QID) | INTRAMUSCULAR | Status: DC | PRN

## 2013-12-06 MED ORDER — ROCURONIUM BROMIDE 100 MG/10ML IV SOLN
INTRAVENOUS | Status: DC | PRN
  Administered 2013-12-06: 50 mg via INTRAVENOUS

## 2013-12-06 MED ORDER — OXYCODONE HCL 5 MG PO TABS
5.0000 mg | ORAL_TABLET | ORAL | Status: DC | PRN
  Administered 2013-12-06: 5 mg via ORAL
  Filled 2013-12-06: qty 1

## 2013-12-06 MED ORDER — ACETAMINOPHEN 325 MG PO TABS: 325.0000 mg | ORAL_TABLET | ORAL | Status: DC | PRN

## 2013-12-06 MED ORDER — PROTAMINE SULFATE 10 MG/ML IV SOLN
INTRAVENOUS | Status: DC | PRN
  Administered 2013-12-06 (×2): 10 mg via INTRAVENOUS

## 2013-12-06 MED ORDER — ARTIFICIAL TEARS OP OINT
TOPICAL_OINTMENT | OPHTHALMIC | Status: DC | PRN
  Administered 2013-12-06: 1 via OPHTHALMIC

## 2013-12-06 MED ORDER — ONDANSETRON HCL 4 MG/2ML IJ SOLN
INTRAMUSCULAR | Status: DC | PRN
  Administered 2013-12-06: 3 mg via INTRAVENOUS

## 2013-12-06 MED ORDER — PHENOL 1.4 % MT LIQD: 1.0000 | OROMUCOSAL | Status: DC | PRN

## 2013-12-06 MED ORDER — DOPAMINE-DEXTROSE 3.2-5 MG/ML-% IV SOLN: 3.0000 ug/kg/min | INTRAVENOUS | Status: DC

## 2013-12-06 MED ORDER — ACETAMINOPHEN 650 MG RE SUPP: 325.0000 mg | RECTAL | Status: DC | PRN

## 2013-12-06 MED ORDER — SODIUM CHLORIDE 0.9 % IV SOLN
10.0000 mg | INTRAVENOUS | Status: DC | PRN
  Administered 2013-12-06: 25 ug/min via INTRAVENOUS

## 2013-12-06 MED ORDER — LACTATED RINGERS IV SOLN
INTRAVENOUS | Status: DC | PRN
  Administered 2013-12-06: 07:00:00 via INTRAVENOUS

## 2013-12-06 MED ORDER — OMEGA-3-ACID ETHYL ESTERS 1 G PO CAPS
1.0000 g | ORAL_CAPSULE | Freq: Every day | ORAL | Status: DC
  Administered 2013-12-06 – 2013-12-07 (×2): 1 g via ORAL
  Filled 2013-12-06 (×2): qty 1

## 2013-12-06 MED ORDER — 0.9 % SODIUM CHLORIDE (POUR BTL) OPTIME
TOPICAL | Status: DC | PRN
  Administered 2013-12-06: 1000 mL

## 2013-12-06 MED ORDER — IODIXANOL 320 MG/ML IV SOLN
INTRAVENOUS | Status: DC | PRN
  Administered 2013-12-06: 150 mL via INTRA_ARTERIAL

## 2013-12-06 MED ORDER — METHYLPREDNISOLONE SODIUM SUCC 125 MG IJ SOLR
INTRAMUSCULAR | Status: DC | PRN
  Administered 2013-12-06: 125 mg via INTRAVENOUS

## 2013-12-06 MED ORDER — FENTANYL CITRATE 0.05 MG/ML IJ SOLN
INTRAMUSCULAR | Status: DC | PRN
  Administered 2013-12-06: 50 ug via INTRAVENOUS
  Administered 2013-12-06: 100 ug via INTRAVENOUS

## 2013-12-06 MED ORDER — LIDOCAINE HCL (CARDIAC) 20 MG/ML IV SOLN
INTRAVENOUS | Status: DC | PRN
  Administered 2013-12-06: 100 mg via INTRAVENOUS

## 2013-12-06 MED ORDER — GLYCOPYRROLATE 0.2 MG/ML IJ SOLN
INTRAMUSCULAR | Status: DC | PRN
  Administered 2013-12-06: 0.6 mg via INTRAVENOUS
  Administered 2013-12-06: 0.2 mg via INTRAVENOUS

## 2013-12-06 MED ORDER — ENOXAPARIN SODIUM 40 MG/0.4ML ~~LOC~~ SOLN
40.0000 mg | SUBCUTANEOUS | Status: DC
  Administered 2013-12-07: 40 mg via SUBCUTANEOUS
  Filled 2013-12-06: qty 0.4

## 2013-12-06 MED ORDER — SODIUM CHLORIDE 0.9 % IV SOLN: 500.0000 mL | Freq: Once | INTRAVENOUS | Status: AC | PRN

## 2013-12-06 MED ORDER — SODIUM CHLORIDE 0.9 % IV SOLN
INTRAVENOUS | Status: DC
  Administered 2013-12-06: 13:00:00 via INTRAVENOUS
  Administered 2013-12-07: 1000 mL via INTRAVENOUS

## 2013-12-06 MED ORDER — HYDROMORPHONE HCL PF 1 MG/ML IJ SOLN
0.2500 mg | INTRAMUSCULAR | Status: DC | PRN
  Administered 2013-12-06 (×7): 0.25 mg via INTRAVENOUS

## 2013-12-06 SURGICAL SUPPLY — 73 items
APL SKNCLS STERI-STRIP NONHPOA (GAUZE/BANDAGES/DRESSINGS) ×1
BAG BANDED W/RUBBER/TAPE 36X54 (MISCELLANEOUS) ×2 IMPLANT
BAG EQP BAND 135X91 W/RBR TAPE (MISCELLANEOUS) ×1
BAG SNAP BAND KOVER 36X36 (MISCELLANEOUS) ×2 IMPLANT
BENZOIN TINCTURE PRP APPL 2/3 (GAUZE/BANDAGES/DRESSINGS) ×2 IMPLANT
CANISTER SUCTION 2500CC (MISCELLANEOUS) ×2 IMPLANT
CATH BEACON 5.038 65CM KMP-01 (CATHETERS) ×2 IMPLANT
CATH OMNI FLUSH .035X70CM (CATHETERS) ×2 IMPLANT
CLIP LIGATING EXTRA MED SLVR (CLIP) ×2 IMPLANT
CLIP LIGATING EXTRA SM BLUE (MISCELLANEOUS) ×2 IMPLANT
CLSR STERI-STRIP ANTIMIC 1/2X4 (GAUZE/BANDAGES/DRESSINGS) ×2 IMPLANT
COVER DOME SNAP 22 D (MISCELLANEOUS) ×2 IMPLANT
COVER MAYO STAND STRL (DRAPES) ×2 IMPLANT
COVER PROBE W GEL 5X96 (DRAPES) ×2 IMPLANT
COVER SURGICAL LIGHT HANDLE (MISCELLANEOUS) ×2 IMPLANT
DEVICE CLOSURE PERCLS PRGLD 6F (VASCULAR PRODUCTS) ×4 IMPLANT
DRAPE TABLE COVER HEAVY DUTY (DRAPES) ×2 IMPLANT
DRESSING OPSITE X SMALL 2X3 (GAUZE/BANDAGES/DRESSINGS) ×2 IMPLANT
DRYSEAL FLEXSHEATH 12FR 33CM (SHEATH) ×1
DRYSEAL FLEXSHEATH 16FR 33CM (SHEATH) ×1
ELECT CAUTERY BLADE 6.4 (BLADE) IMPLANT
ELECT REM PT RETURN 9FT ADLT (ELECTROSURGICAL) ×4
ELECTRODE REM PT RTRN 9FT ADLT (ELECTROSURGICAL) ×2 IMPLANT
EXCLUDER TNK LEG 23MX12X16 (Endovascular Graft) ×1 IMPLANT
EXCLUDER TRUNK LEG 23MX12X16 (Endovascular Graft) ×2 IMPLANT
GAUZE SPONGE 2X2 8PLY STRL LF (GAUZE/BANDAGES/DRESSINGS) ×1 IMPLANT
GLOVE BIOGEL PI IND STRL 6.5 (GLOVE) ×1 IMPLANT
GLOVE BIOGEL PI IND STRL 7.5 (GLOVE) ×1 IMPLANT
GLOVE BIOGEL PI INDICATOR 6.5 (GLOVE) ×1
GLOVE BIOGEL PI INDICATOR 7.5 (GLOVE) ×1
GLOVE ECLIPSE 7.0 STRL STRAW (GLOVE) ×2 IMPLANT
GLOVE SS BIOGEL STRL SZ 7.5 (GLOVE) ×1 IMPLANT
GLOVE SUPERSENSE BIOGEL SZ 7.5 (GLOVE) ×1
GOWN STRL NON-REIN LRG LVL3 (GOWN DISPOSABLE) ×8 IMPLANT
GRAFT BALLN CATH 65CM (STENTS) ×1 IMPLANT
GRAFT EXCLUDER LEG 12X12 (Endovascular Graft) ×2 IMPLANT
KIT BASIN OR (CUSTOM PROCEDURE TRAY) ×2 IMPLANT
KIT ROOM TURNOVER OR (KITS) ×2 IMPLANT
NEEDLE PERC 18GX7CM (NEEDLE) ×2 IMPLANT
NS IRRIG 1000ML POUR BTL (IV SOLUTION) ×2 IMPLANT
PACK AORTA (CUSTOM PROCEDURE TRAY) ×2 IMPLANT
PAD ARMBOARD 7.5X6 YLW CONV (MISCELLANEOUS) ×4 IMPLANT
PENCIL BUTTON HOLSTER BLD 10FT (ELECTRODE) IMPLANT
PERCLOSE PROGLIDE 6F (VASCULAR PRODUCTS) ×8
PROTECTION STATION PRESSURIZED (MISCELLANEOUS) ×2
SHEATH AVANTI 11CM 8FR (MISCELLANEOUS) ×2 IMPLANT
SHEATH BRITE TIP 8FR 23CM (MISCELLANEOUS) ×2 IMPLANT
SHEATH DRYSEAL FLEX 12FR 33CM (SHEATH) ×1 IMPLANT
SHEATH DRYSEAL FLEX 16FR 33CM (SHEATH) ×1 IMPLANT
SPONGE GAUZE 2X2 STER 10/PKG (GAUZE/BANDAGES/DRESSINGS) ×1
STAPLER VISISTAT 35W (STAPLE) IMPLANT
STATION PROTECTION PRESSURIZED (MISCELLANEOUS) ×1 IMPLANT
STENT GRAFT BALLN CATH 65CM (STENTS) ×1
STOPCOCK MORSE 400PSI 3WAY (MISCELLANEOUS) ×2 IMPLANT
STRIP CLOSURE SKIN 1/2X4 (GAUZE/BANDAGES/DRESSINGS) ×2 IMPLANT
SUT ETHILON 3 0 PS 1 (SUTURE) IMPLANT
SUT PROLENE 5 0 C 1 24 (SUTURE) IMPLANT
SUT VIC AB 2-0 CTX 36 (SUTURE) IMPLANT
SUT VIC AB 3-0 SH 18 (SUTURE) IMPLANT
SUT VIC AB 3-0 SH 27 (SUTURE)
SUT VIC AB 3-0 SH 27X BRD (SUTURE) IMPLANT
SUT VICRYL 4-0 PS2 18IN ABS (SUTURE) ×4 IMPLANT
SYR 20CC LL (SYRINGE) ×4 IMPLANT
SYR 30ML LL (SYRINGE) IMPLANT
SYR 5ML LL (SYRINGE) ×2 IMPLANT
SYR MEDRAD MARK V 150ML (SYRINGE) ×2 IMPLANT
SYRINGE 10CC LL (SYRINGE) ×6 IMPLANT
TOWEL OR 17X24 6PK STRL BLUE (TOWEL DISPOSABLE) ×4 IMPLANT
TOWEL OR 17X26 10 PK STRL BLUE (TOWEL DISPOSABLE) ×4 IMPLANT
TRAY FOLEY CATH 16FRSI W/METER (SET/KITS/TRAYS/PACK) ×2 IMPLANT
TUBING HIGH PRESSURE 120CM (CONNECTOR) ×2 IMPLANT
WIRE AMPLATZ SS-J .035X180CM (WIRE) ×4 IMPLANT
WIRE BENTSON .035X145CM (WIRE) ×4 IMPLANT

## 2013-12-06 NOTE — Progress Notes (Signed)
Call to [facility], /w [employer] [facility], she will be coming to [facility] to see pt. shortly.

## 2013-12-06 NOTE — Anesthesia Postprocedure Evaluation (Signed)
  Anesthesia Post-op Note  Patient: Joel Lin  Procedure(s) Performed: Procedure(s): ABDOMINAL AORTIC ENDOVASCULAR STENT GRAFT (N/A)  Patient Location: PACU  Anesthesia Type:General  Level of Consciousness: awake  Airway and Oxygen Therapy: Patient Spontanous Breathing  Post-op Pain: mild  Post-op Assessment: Post-op Vital signs reviewed  Post-op Vital Signs: Reviewed  Complications: No apparent anesthesia complications

## 2013-12-06 NOTE — Interval H&P Note (Signed)
History and Physical Interval Note:  12/06/2013 7:01 AM  Joel Lin  has presented today for surgery, with the diagnosis of AAA  The various methods of treatment have been discussed with the patient and family. After consideration of risks, benefits and other options for treatment, the patient has consented to  Procedure(s): ABDOMINAL AORTIC ENDOVASCULAR STENT GRAFT (N/A) as a surgical intervention .  The patient's history has been reviewed, patient examined, no change in status, stable for surgery.  I have reviewed the patient's chart and labs.  Questions were answered to the patient's satisfaction.     Joel Lin

## 2013-12-06 NOTE — Op Note (Signed)
    OPERATIVE REPORT  DATE OF SURGERY: 12/06/2013  PATIENT: Joel Lin, 72 y.o. male MRN: [id number]  DOB: [birth year]  PRE-OPERATIVE DIAGNOSIS: Abdominal aortic aneurysm  POST-OPERATIVE DIAGNOSIS:  Same  PROCEDURE: Gore stent graft repair of abdominal aortic aneurysm  SURGEON:  Gretta Began, M.D.  PHYSICIAN ASSISTANT: Rhyne  ANESTHESIA:  Gen.  EBL: Minimal ml  Total I/O In: 1500 [I.V.:1500] Out: 500 [Urine:300; Blood:200]  BLOOD ADMINISTERED: None  DRAINS: None  SPECIMEN: None  COUNTS CORRECT:  YES  PLAN OF CARE: PACU   PATIENT DISPOSITION:  PACU - hemodynamically stable  PROCEDURE DETAILS: The patient was taken to the operating placed supine position where the area of both groins and abdomen were prepped and draped in usual sterile fashion. Using SonoSite ultrasound of the right and left common femoral arteries were accessed with an 18-gauge needle and a guidewire was passed centrally. A Perclose device was deployed at the 10:00 and 2:00 position over both guidewires and left in place for closure at the end of the case. 8 French sheaths were placed bilaterally. A Benson wire was passed above the level renal arteries and a pigtail catheter is positioned over this. The [facility] wire was exchanged for an Amplatz superstiff wire. The 22 French catheter was passed over the Amplatz superstiff wire. The 23 x 12 x 16 main body device was positioned through the right groin at the level of the renal arteries which been determined by preoperative CT scan. The pigtail catheter was then placed over the guidewire through the left femoral sheath above the level renal arteries and the AP projection was undertaken to confirm the level of the renal arteries. The main body device was partially deployed below the level of renal arteries. This was placed in the configuration of the contralateral gate was coming straight anteriorly. The pigtail catheter was then withdrawn back into the  aneurysm sac and a comfy catheter was exchanged over the wire and the contralateral gate was cannulated with the Bentson wire. Pigtail catheter was then positioned through the contralateral gate and turned within the main body device to confirm this was in the device. The 8 French sheath catheter on the right was exchanged for a 12 [place] sheath. The contralateral limb was chosen which was a 1212 cm limb. A retrograde injection through the sheath and from the level hypogastric artery takeoff. The contralateral limb was deployed without difficulty. Finally the ipsilateral limb was deployed and the deployment device was removed. The superior and inferior attachment sites were all gently dilated with balloon as was the junction. The cath was positioned a back above the level device and a final AP projection was undertaken which showed excellent positioning with no evidence of type I leak. There was prominent lumbar collaterals were seen on CT scan which did show retrograde flow the type II endoleak. Patient had been given 6000 images heparin prior to placement of the large sheath. The large sheaths were removed sequentially and the Perclose devices were deployed with excellent hemostasis. The patient was given 50 mg of protamine to reverse the heparin. The puncture sites were closed with a single 4 subcuticular Vicryl stitch. Shah dressings were applied. The patient had no change in his a biphasic posterior tibial signals and was transferred to the recovery room in stable condition   Gretta Began, M.D. 12/06/2013 10:15 AM

## 2013-12-06 NOTE — Anesthesia Procedure Notes (Signed)
Procedure Name: Intubation Date/Time: 12/06/2013 7:58 AM Performed by: Carmela Rima Pre-anesthesia Checklist: Patient identified, Emergency Drugs available, Suction available, Patient being monitored and Timeout performed Patient Re-evaluated:Patient Re-evaluated prior to inductionOxygen Delivery Method: Circle system utilized Preoxygenation: Pre-oxygenation with 100% oxygen Intubation Type: IV induction Ventilation: Mask ventilation without difficulty Laryngoscope Size: Mac and 3 Grade View: Grade II Tube type: Oral Tube size: 7.5 mm Placement Confirmation: ETT inserted through vocal cords under direct vision,  positive ETCO2 and breath sounds checked- equal and bilateral Secured at: 23 cm Tube secured with: Tape Dental Injury: Teeth and Oropharynx as per pre-operative assessment

## 2013-12-06 NOTE — Progress Notes (Signed)
Utilization review completed.  

## 2013-12-06 NOTE — Preoperative (Signed)
Beta Blockers   Reason not to administer Beta Blockers:Not Applicable 

## 2013-12-06 NOTE — Anesthesia Preprocedure Evaluation (Addendum)
Anesthesia Evaluation  Patient identified by MRN, date of birth, ID band Patient awake    Reviewed: Allergy & Precautions, H&P , NPO status , Patient's Chart, lab work & pertinent test results, reviewed documented beta blocker date and time   Airway Mallampati: II TM Distance: >3 FB Neck ROM: full    Dental  (+) Teeth Intact and Dental Advidsory Given   Pulmonary neg pulmonary ROS, former smoker,          Cardiovascular + CAD, + Past MI and + Peripheral Vascular Disease + dysrhythmias Ventricular Tachycardia + pacemaker + Cardiac Defibrillator     Neuro/Psych    GI/Hepatic negative GI ROS, Neg liver ROS,   Endo/Other  negative endocrine ROS  Renal/GU negative Renal ROS     Musculoskeletal   Abdominal   Peds  Hematology   Anesthesia Other Findings   Reproductive/Obstetrics                         Anesthesia Physical Anesthesia Plan  ASA: IV  Anesthesia Plan: General   Post-op Pain Management:    Induction:   Airway Management Planned:   Additional Equipment:   Intra-op Plan:   Post-operative Plan:   Informed Consent:   Dental Advisory Given and Dental advisory given  Plan Discussed with: Anesthesiologist, CRNA and Surgeon  Anesthesia Plan Comments:        Anesthesia Quick Evaluation

## 2013-12-06 NOTE — H&P (View-Only) (Signed)
   Patient name: Joel Lin MRN: [id number] DOB: 09/07/1941 Sex: male     Reason for referral:  Chief Complaint  Patient presents with  . Follow-up    CTA of abdomen/pelvis  11-26-2013 at [facility]  . AAA    HISTORY OF PRESENT ILLNESS: The patient has today for continued discussion of his abdominal aortic aneurysm. He had a CT angiogram today. He was treated retreated with Benadryl and steroids has had no allergic reaction. He did have hives after cardiac catheterization in 1981. No change in his history or physical since my last visit with him.  Past Medical History  Diagnosis Date  . Coronary artery disease     myoview 2011>>EF of 53% / large area of old inferolateral infarct but no reversible ischemia          . Cardiac arrest 03/29/2008    at home without resuscitation anywhere from 7-10 minutes before EMS arrived/ The patient was noted to be in ventricular fibrillation  required defibrillation x3               . Hypercoagulable state     Questionable history of a borderline hypercoagulable state  . Hyperlipidemia   . Anoxic brain damage      resolved  . Respiratory failure 2009    Respiratory failure and pulmonary collapse requiring intubation  . 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 PVCs (premature ventricular contractions)     occasionally  . Peripheral vascular disease   . Myocardial infarction   . AAA (abdominal aortic aneurysm)   . Contrast media allergy     Past Surgical History  Procedure Laterality Date  . Cholecystectomy, laparoscopic  08/13/2009    Calculous cholecystitis  . Insert / replace / remove pacemaker  04/07/2008      cardioverter defibrillator, St. Jude Single-chamber defibrillator implantation with intraoperative defibrillation threshold testing /  Durata 7121 dual-coil active defibrillator lead, serial  #[id number]   . Cardiac catheterization  04/04/2008     LAD stent patent, D1 OK, OM1  90/80/80% (2mm vessel), RCA 30%; EF 35%.  . Coronary angioplasty with stent placement  1999    PTCA and stenting of his left anterior descending artery  . Cholecystectomy      History   Social History  . Marital Status: Married    Spouse Name: N/A    Number of Children: N/A  . Years of Education: N/A   Occupational History  . Retired Teacher    Social History Main Topics  . Smoking status: Former Smoker -- 1.00 packs/day for 20 years    Types: Cigarettes    Quit date: 12/23/1979  . Smokeless tobacco: Never Used  . Alcohol Use: No  . Drug Use: No  . Sexual Activity: Not on file   Other Topics Concern  . Not on file   Social History Narrative  . No narrative on file    Family History  Problem Relation Age of Onset  . Coronary artery disease      strong family history of   . Hyperlipidemia Mother   . Heart disease Father     before age 60  . Hyperlipidemia Father     Allergies as of 11/26/2013 - Review Complete 11/26/2013  Allergen Reaction Noted  . Losartan potassium Palpitations 12/24/2012  . Clopidogrel bisulfate Hives 01/24/2011  . Epinephrine  04/16/2013  . Iodine Hives   . Metoprolol Other (See Comments) 08/28/2012  . Mydriacyl [tropicamide]    04/16/2013  . Phenylephrine hcl  04/16/2013  . Statins Other (See Comments) 08/28/2012    Current Outpatient Prescriptions on File Prior to Visit  Medication Sig Dispense Refill  . aspirin 81 MG tablet Take 81 mg by mouth daily.        . atenolol (TENORMIN) 25 MG tablet Take 0.5 tablets (12.5 mg total) by mouth 2 (two) times daily.  90 tablet  3  . Cholecalciferol (VITAMIN D-3 PO) Take 300 mg by mouth daily.       . colesevelam (WELCHOL) 625 MG tablet Take 1 tablet (625 mg total) by mouth 2 (two) times daily with a meal.      . fish oil-omega-3 fatty acids 1000 MG capsule Take 1 g by mouth 2 (two) times daily.      . Glucosamine Sulfate-MSM (GLUCOSAMINE-MSM DS PO) Take 1 tablet by mouth daily.      . ibuprofen  (ADVIL,MOTRIN) 200 MG tablet Take 400 mg by mouth every 8 (eight) hours as needed for pain.      . lisinopril (PRINIVIL,ZESTRIL) 2.5 MG tablet Take 1 tablet (2.5 mg total) by mouth daily.  90 tablet  3  . loratadine (CLARITIN) 10 MG tablet Take 10 mg by mouth daily.      . Multiple Vitamin (MULTIVITAMIN WITH MINERALS) TABS Take 1 tablet by mouth daily.      . nitroGLYCERIN (NITRODUR - DOSED IN MG/24 HR) 0.2 mg/hr Place 1 patch (0.2 mg total) onto the skin daily.  90 patch  3  . nitroGLYCERIN (NITROSTAT) 0.4 MG SL tablet Place 1 tablet (0.4 mg total) under the tongue every 5 (five) minutes as needed.  25 tablet  3  . rosuvastatin (CRESTOR) 5 MG tablet 1 tablet every other day  45 tablet  3   No current facility-administered medications on file prior to visit.      PHYSICAL EXAMINATION:  General: The patient is a well-nourished male, in no acute distress. Vital signs are BP 136/79  Pulse 86  Resp 18  Ht 5' 9" (1.753 m)  Wt 184 lb 11.2 oz (83.779 kg)  BMI 27.26 kg/m2 Pulmonary: There is a good air exchange   Musculoskeletal: There are no major deformities.  There is no significant extremity pain. Neurologic: No focal weakness or paresthesias are detected, Skin: There are no ulcer or rashes noted. Psychiatric: The patient has normal affect. Cardiovascular: Palpable without cells pedis pulses bilaterally   CT scan was reviewed with the patient and his wife present. This does show 5.4 cm infrarenal abdominal aortic aneurysm. He has very favorable anatomy for stent graft repair both in his attachment sites and in his axis vessels. I discussed this at length with the patient. He is quite anxious to proceed with repair and I feel this is appropriate. We will pressure a cardiac clearance with Dr. Brackbill and then proceed with elective stent graft repair.  Impression and Plan:  5.4 centimeter infrarenal abdominal aortic aneurysm for stent graft repair    EARLY, TODD [facility] Office: [phone]         

## 2013-12-06 NOTE — Transfer of Care (Signed)
Immediate Anesthesia Transfer of Care Note  Patient: Joel Lin  Procedure(s) Performed: Procedure(s): ABDOMINAL AORTIC ENDOVASCULAR STENT GRAFT (N/A)  Patient Location: PACU  Anesthesia Type:General  Level of Consciousness: awake, alert  and oriented  Airway & Oxygen Therapy: Patient Spontanous Breathing and Patient connected to nasal cannula oxygen  Post-op Assessment: Report given to PACU RN, Post -op Vital signs reviewed and stable and Patient moving all extremities X 4  Post vital signs: Reviewed and stable  Complications: No apparent anesthesia complications

## 2013-12-07 LAB — CBC
HCT: 37.3 % — ABNORMAL LOW (ref 39.0–52.0)
MCH: 28.8 pg (ref 26.0–34.0)
MCHC: 33.8 g/dL (ref 30.0–36.0)
Platelets: 127 10*3/uL — ABNORMAL LOW (ref 150–400)
RDW: 13.2 % (ref 11.5–15.5)

## 2013-12-07 LAB — BASIC METABOLIC PANEL
BUN: 19 mg/dL (ref 6–23)
Calcium: 8.1 mg/dL — ABNORMAL LOW (ref 8.4–10.5)
Chloride: 105 mEq/L (ref 96–112)
Creatinine, Ser: 0.86 mg/dL (ref 0.50–1.35)
GFR calc Af Amer: >90 mL/min (ref >90)
Glucose, Bld: 152 mg/dL — ABNORMAL HIGH (ref 70–99)

## 2013-12-07 NOTE — Progress Notes (Addendum)
Discharge instructions explained and discussed to patient and family, exit care notes on AAA post op care, f/u appt., prescription. Given to family. Pt going home with wife via w/c.

## 2013-12-07 NOTE — Progress Notes (Signed)
Pt HR 130's MD made aware and gave orders will continue to monitor.

## 2013-12-07 NOTE — Discharge Summary (Signed)
[facility] Discharge Summary  Joel Lin Mar 10, 1941 72 y.o. male  [id number]  Admission Date: 12/06/2013  Discharge Date: 12/07/13  Physician: Larina Earthly, MD  Admission Diagnosis: AAA   HPI:   This is a 72 y.o. male who has today for continued discussion of his abdominal aortic aneurysm. He had a CT angiogram today. He was treated retreated with Benadryl and steroids has had no allergic reaction. He did have hives after cardiac catheterization in 1981. No change in his history or physical since my last visit with him.  Hospital Course:  The patient was admitted to the hospital and taken to the operating room on 12/06/2013 and underwent: Gore stent graft repair of abdominal aortic aneurysm    The pt tolerated the procedure well and was transported to the PACU in good condition.   On the night of surgery, he did have a bout of tachycardia with a rate in the 130's.  He was given a cardizem bolus and his heart rate recovered.  He was placed on cardizem gtt for a short amount of time and it was discontinued.  He did not have anymore tachycardia.  He also received his home dose of beta blocker.  By POD 1, he was doing well.  The remainder of the hospital course consisted of increasing mobilization and increasing intake of solids without difficulty.  CBC    Component Value Date/Time   WBC 10.6* 12/06/2013 1300   RBC 5.10 12/06/2013 1300   HGB 14.6 12/06/2013 1300   HCT 43.4 12/06/2013 1300   PLT 125* 12/06/2013 1300   MCV 85.1 12/06/2013 1300   MCH 28.6 12/06/2013 1300   MCHC 33.6 12/06/2013 1300   RDW 13.1 12/06/2013 1300   LYMPHSABS 1.0 02/26/2013 1602   MONOABS 0.8 02/26/2013 1602   EOSABS 0.0 02/26/2013 1602   BASOSABS 0.0 02/26/2013 1602    BMET    Component Value Date/Time   NA 136 12/07/2013 0500   K 4.2 12/07/2013 0500   CL 105 12/07/2013 0500   CO2 23 12/07/2013 0500   GLUCOSE 152* 12/07/2013 0500   BUN 19 12/07/2013 0500    CREATININE 0.86 12/07/2013 0500   CREATININE 0.82 11/06/2013 1035   CALCIUM 8.1* 12/07/2013 0500   GFRNONAA 85* 12/07/2013 0500   GFRAA >90 12/07/2013 0500     Discharge Instructions:   The patient is discharged to home with extensive instructions on wound care and progressive ambulation.  They are instructed not to drive or perform any heavy lifting until returning to see the physician in his office.  Discharge Orders   Future Appointments Provider Department Dept Phone   12/26/2013 8:30 AM [facility] Remotes [facility] Office [phone]   02/26/2014 8:00 AM [facility] Lab [facility] [place] Office [phone]   03/05/2014 9:00 AM Cassell Clement, MD [facility] [phone]   Future Orders Complete By Expires   ABDOMINAL PROCEDURE/ANEURYSM REPAIR/AORTO-BIFEMORAL BYPASS:  Call MD for increased abdominal pain; cramping diarrhea; nausea/vomiting  As directed    Call MD for:  redness, tenderness, or signs of infection (pain, swelling, bleeding, redness, odor or green/yellow discharge around incision site)  As directed    Call MD for:  severe or increased pain, loss or decreased feeling  in affected limb(s)  As directed    Call MD for:  temperature >100.5  As directed    Driving Restrictions  As directed    Comments:     No  driving for 2 weeks   Lifting restrictions  As directed    Comments:     No lifting for 4 weeks   may wash over wound with mild soap and water  As directed    Nursing communication  As directed    Scheduling Instructions:     Please give paper Rx to patient at discharge.  It is located on the paper chart.   Resume previous diet  As directed       Discharge Diagnosis:  AAA  Secondary Diagnosis: Patient Active Problem List   Diagnosis Date Noted  . AAA (abdominal aortic aneurysm) 12/06/2013  . Implantable defibrillator check 09/25/2013  . Weight gain 12/24/2012  . Sinusitis 12/24/2012  . Abdominal aneurysm  without mention of rupture 11/13/2012  . Ischemic cardiomyopathy 08/28/2012  . Paroxysmal ventricular tachycardia 03/22/2012  . Single implantable cardioverter-defibrillator [employer] 03/22/2012  . PVC's (premature ventricular contractions) 05/10/2011  . Abdominal aortic aneurysm 05/10/2011  . HYPERLIPIDEMIA, MIXED 08/10/2009  . CORONARY ATHEROSCLEROSIS 08/10/2009  . VENTRICULAR FIBRILLATION 08/10/2009   Past Medical History  Diagnosis Date  . Coronary artery disease     myoview 2011>>EF of 53% / large area of old inferolateral infarct but no reversible ischemia          . Cardiac arrest 03/29/2008    at home without resuscitation anywhere from 7-10 minutes before EMS arrived/ The patient was noted to be in ventricular fibrillation  required defibrillation x3               . Hypercoagulable state     Questionable history of a borderline hypercoagulable state  . Hyperlipidemia   . Anoxic brain damage      resolved  . Respiratory failure 2009    Respiratory failure and pulmonary collapse requiring intubation  . 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 PVCs (premature ventricular contractions)     occasionally  . Peripheral vascular disease   . AAA (abdominal aortic aneurysm)   . Contrast media allergy   . Myocardial infarction 1981  . Hemorrhoid   . Pacemaker   . Automatic implantable cardioverter-defibrillator in situ        Medication List         aspirin 81 MG tablet  Take 81 mg by mouth daily.     atenolol 25 MG tablet  Commonly known as:  TENORMIN  Take 12.5 mg by mouth 2 (two) times daily.     colesevelam 625 MG tablet  Commonly known as:  WELCHOL  Take 1 tablet (625 mg total) by mouth 2 (two) times daily with a meal.     fish oil-omega-3 fatty acids 1000 MG capsule  Take 1 g by mouth daily.     GLUCOSAMINE-MSM DS PO  Take 1 tablet by mouth daily.     ibuprofen 200 MG tablet  Commonly known as:  ADVIL,MOTRIN  Take 400 mg by  mouth every 8 (eight) hours as needed for pain.     lisinopril 2.5 MG tablet  Commonly known as:  PRINIVIL,ZESTRIL  Take 2.5 mg by mouth daily.     loratadine 10 MG tablet  Commonly known as:  CLARITIN  Take 10 mg by mouth daily.     multivitamin tablet  Take 1 tablet by mouth daily.     multivitamin with minerals Tabs tablet  Take 1 tablet by mouth daily.     nitroGLYCERIN 0.2 mg/hr patch  Commonly known as:  NITRODUR - Dosed  in mg/24 hr  Place 1 patch (0.2 mg total) onto the skin daily.     oxyCODONE 5 MG immediate release tablet  Commonly known as:  ROXICODONE  Take 1 tablet (5 mg total) by mouth every 6 (six) hours as needed for severe pain.     rosuvastatin 5 MG tablet  Commonly known as:  CRESTOR  Take 5 mg by mouth every other day. 1 tablet every other day     VITAMIN D-3 PO  Take 300 mg by mouth daily.        Roxicodone #30 No Refill  Disposition: home  Patient's condition: is Good  Follow up: 1. Dr. Arbie Cookey in 4 weeks with CTA   Doreatha Massed, PA-C [facility] [phone] 12/07/2013  7:24 AM   - For VQI Registry use --- Instructions: Press F2 to tab through selections.  Delete question if not applicable.   Post-op:  Time to Extubation: [ x] In OR, [ ]  < 12 hrs, [ ]  12-24 hrs, [ ]  >=24 hrs Vasopressors Req. Post-op: No MI: no, [ ]  Troponin only, [ ]  EKG or Clinical New Arrhythmia: No CHF: No ICU Stay: 1 day stepdown Transfusion: No  If yes, n/a units given  Complications: Resp failure: no, [ ]  Pneumonia, [ ]  Ventilator Chg in renal function: no, [ ]  Inc. Cr > 0.5, [ ]  Temp. Dialysis, [ ]  Permanent dialysis Leg ischemia: no, no Surgery needed, [ ]  Yes, Surgery needed, [ ]  Amputation Bowel ischemia: no, [ ]  Medical Rx, [ ]  Surgical Rx Wound complication: no, [ ]  Superficial separation/infection, [ ]  Return to OR Return to OR: No  Return to OR for bleeding: No Stroke: no, [ ]  Minor, [ ]  Major  Discharge  medications: Statin use:  Yes If No: [ ]  For Medical reasons, [ ]  Non-compliant, [ ]  Not-indicated ASA use:  Yes  If No: [ ]  For Medical reasons, [ ]  Non-compliant, [ ]  Not-indicated Plavix use:  No If No: [ ]  For Medical reasons, [ ]  Non-compliant, [ ]  Not-indicated Beta blocker use:  Yes If No: [ ]  For Medical reasons, [ ]  Non-compliant, [ ]  Not-indicated

## 2013-12-07 NOTE — Progress Notes (Signed)
   VASCULAR PROGRESS NOTE  SUBJECTIVE: No complaints.  PHYSICAL EXAM: Filed Vitals:   12/07/13 0200 12/07/13 0300 12/07/13 0400 12/07/13 0500  BP: 108/50 101/47 112/51 111/52  Pulse: 62 61 65 59  Temp:   97.8 F (36.6 C)   TempSrc:   Oral   Resp: 12 12 14 13   Height:      Weight:      SpO2: 92% 92% 95% 93%   Groins looked fine. Lungs clear.  Palpable dorsalis pedis pulses.  LABS: Lab Results  Component Value Date   WBC 10.6* 12/06/2013   HGB 14.6 12/06/2013   HCT 43.4 12/06/2013   MCV 85.1 12/06/2013   PLT 125* 12/06/2013   Lab Results  Component Value Date   CREATININE 0.86 12/07/2013   Lab Results  Component Value Date   INR 1.14 12/06/2013   Active Problems:   AAA (abdominal aortic aneurysm)   ASSESSMENT AND PLAN:  * 1 Day Post-Op s/p: percutaneous endovascular aneurysm repair  *  Doing well. Home today. Follow up in one month with CT scan of abdomen.  Cari Caraway Beeper: [phone] 12/07/2013

## 2013-12-09 ENCOUNTER — Encounter (HOSPITAL_COMMUNITY): Payer: Self-pay | Admitting: Vascular Surgery

## 2013-12-10 ENCOUNTER — Telehealth: Payer: Self-pay | Admitting: Vascular Surgery

## 2013-12-10 ENCOUNTER — Other Ambulatory Visit: Payer: Self-pay | Admitting: *Deleted

## 2013-12-10 DIAGNOSIS — L500 Allergic urticaria: Secondary | ICD-10-CM

## 2013-12-10 MED ORDER — DIPHENHYDRAMINE HCL 50 MG PO CAPS: ORAL_CAPSULE | ORAL | Status: DC

## 2013-12-10 MED ORDER — PREDNISONE 50 MG PO TABS: ORAL_TABLET | ORAL | Status: DC

## 2013-12-10 NOTE — Telephone Encounter (Addendum)
Message copied by Fredrich Birks on Tue Dec 10, 2013 10:13 AM ------      Message from: Sharee Pimple      Created: Fri Dec 06, 2013  9:52 AM      Regarding: schedule                   ----- Message -----         From: Dara Lords, PA-C         Sent: 12/06/2013   9:45 AM           To: Sharee Pimple, CMA, [facility]            S/p [facility] 12/06/13.  F/u in 4 weeks with CTA for protocol with Dr. Arbie Cookey.            Thanks,      Samantha ------  12/10/13: spoke with patient to schedule. We have called in pre-meds to [facility] on [address]. Mailed instruction letter to home address and sent sm to [facility] for pre-auth, dpm

## 2013-12-16 ENCOUNTER — Encounter: Payer: Self-pay | Admitting: Vascular Surgery

## 2013-12-26 ENCOUNTER — Ambulatory Visit (INDEPENDENT_AMBULATORY_CARE_PROVIDER_SITE_OTHER): Payer: Medicare Other | Admitting: *Deleted

## 2013-12-26 DIAGNOSIS — I4901 Ventricular fibrillation: Secondary | ICD-10-CM

## 2013-12-26 DIAGNOSIS — Z95810 Presence of automatic (implantable) cardiac defibrillator: Secondary | ICD-10-CM

## 2013-12-27 ENCOUNTER — Encounter: Payer: Self-pay | Admitting: Internal Medicine

## 2013-12-30 LAB — MDC_IDC_ENUM_SESS_TYPE_REMOTE
HIGH POWER IMPEDANCE MEASURED VALUE: 43 Ohm
Implantable Pulse Generator Serial Number: 549899
Lead Channel Pacing Threshold Amplitude: 1.5 V
Lead Channel Pacing Threshold Pulse Width: 0.6 ms
Lead Channel Sensing Intrinsic Amplitude: 6.5 mV
Lead Channel Setting Pacing Amplitude: 2.5 V
Lead Channel Setting Pacing Pulse Width: 0.6 ms
MDC IDC MSMT BATTERY REMAINING LONGEVITY: 49 mo
MDC IDC MSMT BATTERY VOLTAGE: 2.57 V
MDC IDC MSMT LEADCHNL RV IMPEDANCE VALUE: 300 Ohm
MDC IDC SESS DTM: 20150116050058
MDC IDC SET LEADCHNL RV SENSING SENSITIVITY: 0.3 mV
MDC IDC SET ZONE DETECTION INTERVAL: 280 ms
MDC IDC SET ZONE DETECTION INTERVAL: 330 ms
MDC IDC STAT BRADY RV PERCENT PACED: 1 %
Zone Setting Detection Interval: 250 ms

## 2014-01-07 ENCOUNTER — Encounter: Payer: Self-pay | Admitting: *Deleted

## 2014-01-13 ENCOUNTER — Encounter: Payer: Self-pay | Admitting: Vascular Surgery

## 2014-01-14 ENCOUNTER — Ambulatory Visit
Admission: RE | Admit: 2014-01-14 | Discharge: 2014-01-14 | Disposition: A | Discharge status: Home / Self Care | Payer: Medicare Other | Source: Ambulatory Visit | Attending: Vascular Surgery | Admitting: Vascular Surgery

## 2014-01-14 ENCOUNTER — Ambulatory Visit (INDEPENDENT_AMBULATORY_CARE_PROVIDER_SITE_OTHER): Payer: Self-pay | Admitting: Vascular Surgery

## 2014-01-14 ENCOUNTER — Encounter: Payer: Self-pay | Admitting: Vascular Surgery

## 2014-01-14 VITALS — BP 131/83 | HR 70 | Resp 18 | Ht 69.0 in | Wt 180.6 lb

## 2014-01-14 DIAGNOSIS — Z48812 Encounter for surgical aftercare following surgery on the circulatory system: Secondary | ICD-10-CM

## 2014-01-14 DIAGNOSIS — I714 Abdominal aortic aneurysm, without rupture, unspecified: Secondary | ICD-10-CM

## 2014-01-14 MED ORDER — IOHEXOL 350 MG/ML SOLN
80.0000 mL | Freq: Once | INTRAVENOUS | Status: AC | PRN
  Administered 2014-01-14: 80 mL via INTRAVENOUS

## 2014-01-14 NOTE — Progress Notes (Signed)
.   Day for followup repair of his stent graft repair of abdominal aortic aneurysm on 12/06/2013. He will hospital discharge on postop day 1. He had a percutaneous access to both groins. He is returned the usual activity with no limitation.  On physical exam of both groins are healed from the puncture site with no evidence of false aneurysm. He has 2+ popliteal 2+ dorsalis pedis pulses bilaterally  CT scan today reveals excellent positioning of the stent graft with no evidence of endoleak. His aneurysm sac size is unchanged at 5.4 cm  Impression and plan stable 1 with followup of stent graft repair of abdominal aortic aneurysm. He presents electively for patient. We'll see him again in 6 months with repeat CT scan. For insurance reasons it may be better for him to go to [facility] radiology versus rings were imaging. I explained that it would be okay from our standpoint for either site

## 2014-01-14 NOTE — Addendum Note (Signed)
Addended by: Dorthula Rue L on: 01/14/2014 12:45 PM   Modules accepted: Orders

## 2014-02-10 ENCOUNTER — Telehealth: Payer: Self-pay | Admitting: Cardiology

## 2014-02-10 MED ORDER — ROSUVASTATIN CALCIUM 5 MG PO TABS: ORAL_TABLET | ORAL | Status: DC

## 2014-02-10 NOTE — Telephone Encounter (Signed)
New message    Joel Lin calling asking the nurse to call her back . No information was given.

## 2014-02-26 ENCOUNTER — Other Ambulatory Visit (INDEPENDENT_AMBULATORY_CARE_PROVIDER_SITE_OTHER): Payer: Medicare Other

## 2014-02-26 DIAGNOSIS — I2589 Other forms of chronic ischemic heart disease: Secondary | ICD-10-CM

## 2014-02-26 DIAGNOSIS — E7800 Pure hypercholesterolemia, unspecified: Secondary | ICD-10-CM

## 2014-02-26 DIAGNOSIS — I255 Ischemic cardiomyopathy: Secondary | ICD-10-CM

## 2014-02-26 LAB — LIPID PANEL
CHOL/HDL RATIO: 4
Cholesterol: 147 mg/dL (ref 0–200)
HDL: 35.6 mg/dL — ABNORMAL LOW (ref >39.00)
LDL Cholesterol: 88 mg/dL (ref 0–99)
Triglycerides: 115 mg/dL (ref 0.0–149.0)
VLDL: 23 mg/dL (ref 0.0–40.0)

## 2014-02-26 LAB — BASIC METABOLIC PANEL
BUN: 18 mg/dL (ref 6–23)
CHLORIDE: 105 meq/L (ref 96–112)
CO2: 30 meq/L (ref 19–32)
CREATININE: 0.9 mg/dL (ref 0.4–1.5)
Calcium: 9.1 mg/dL (ref 8.4–10.5)
GFR: 86.03 mL/min (ref >60.00)
GLUCOSE: 101 mg/dL — AB (ref 70–99)
Potassium: 3.9 mEq/L (ref 3.5–5.1)
Sodium: 139 mEq/L (ref 135–145)

## 2014-02-26 LAB — HEPATIC FUNCTION PANEL
ALK PHOS: 58 U/L (ref 39–117)
ALT: 35 U/L (ref 0–53)
AST: 35 U/L (ref 0–37)
Albumin: 4 g/dL (ref 3.5–5.2)
BILIRUBIN DIRECT: 0 mg/dL (ref 0.0–0.3)
Total Bilirubin: 0.6 mg/dL (ref 0.3–1.2)
Total Protein: 6.8 g/dL (ref 6.0–8.3)

## 2014-03-05 ENCOUNTER — Encounter: Payer: Self-pay | Admitting: Cardiology

## 2014-03-05 ENCOUNTER — Ambulatory Visit (INDEPENDENT_AMBULATORY_CARE_PROVIDER_SITE_OTHER): Payer: Medicare Other | Admitting: Cardiology

## 2014-03-05 VITALS — BP 117/66 | HR 60 | Ht 69.0 in | Wt 176.0 lb

## 2014-03-05 DIAGNOSIS — J329 Chronic sinusitis, unspecified: Secondary | ICD-10-CM

## 2014-03-05 DIAGNOSIS — E782 Mixed hyperlipidemia: Secondary | ICD-10-CM

## 2014-03-05 DIAGNOSIS — I2589 Other forms of chronic ischemic heart disease: Secondary | ICD-10-CM

## 2014-03-05 DIAGNOSIS — Z95810 Presence of automatic (implantable) cardiac defibrillator: Secondary | ICD-10-CM

## 2014-03-05 DIAGNOSIS — I714 Abdominal aortic aneurysm, without rupture, unspecified: Secondary | ICD-10-CM

## 2014-03-05 DIAGNOSIS — I255 Ischemic cardiomyopathy: Secondary | ICD-10-CM

## 2014-03-05 MED ORDER — AMOXICILLIN 500 MG PO CAPS: 500.0000 mg | ORAL_CAPSULE | Freq: Three times a day (TID) | ORAL | Status: DC

## 2014-03-05 MED ORDER — ATENOLOL 25 MG PO TABS: 12.5000 mg | ORAL_TABLET | Freq: Two times a day (BID) | ORAL | Status: DC

## 2014-03-05 MED ORDER — LISINOPRIL 2.5 MG PO TABS: 2.5000 mg | ORAL_TABLET | Freq: Every day | ORAL | Status: DC

## 2014-03-05 NOTE — Progress Notes (Signed)
Arthor Captain Date of Birth:  1941/07/04 [address] [place], [place]  [address] [phone]         Fax   [phone]  History of Present Illness: This pleasant 73 year old gentleman is seen for a followup office visit. He has a complex past medical history. He has a history of known ischemic heart disease. He has a history of a remote myocardial infarction at the age of 5. There was a large posterolateral myocardial infarction in 1981. The patient had angioplasty and stent of his LAD in 1999. In 2009 he had an out of hospital cardiac arrest at home was successfully resuscitated and treated with hypothermia without significant neurologic sequelae. He now has a defibrillator in place and is followed by Dr. Caryl Comes. He has a history of hypercholesterolemia. His last nuclear stress test was 10/24/11 and showed an ejection fraction of 45% and a large posterolateral scar but no ischemia. His last echocardiogram was in 2009 and showed an ejection fraction of 35-40%.  On 12/06/13 the patient underwent a stent graft for his abdominal aortic aneurysm.  The patient did well and was discharged without complications.  The patient has his defibrillator monitored through Dr. Olin Pia office. In January 2013 he had 2 episodes of ventricular tachycardia which were asymptomatic.  In March 2014 he was rehospitalized after having 3 consecutive shocks from his defibrillator. He underwent cardiac catheterization by Dr. Burt Knack on 02/28/13 who told him that his arteries have not shown any progression of atherosclerosis since his previous cardiac catheterization in 2009.    Current Outpatient Prescriptions  Medication Sig Dispense Refill  . aspirin 81 MG tablet Take 81 mg by mouth daily.       Marland Kitchen atenolol (TENORMIN) 25 MG tablet Take 0.5 tablets (12.5 mg total) by mouth 2 (two) times daily.  90 tablet  3  . Cholecalciferol (VITAMIN D-3 PO) Take 300 mg by mouth daily.       . colesevelam (WELCHOL)  625 MG tablet Take 1 tablet (625 mg total) by mouth 2 (two) times daily with a meal.      . diphenhydrAMINE (BENADRYL) 50 MG capsule Take 50 mg by mouth 1 hour prior to your procedure.  2 capsule  0  . fish oil-omega-3 fatty acids 1000 MG capsule Take 1 g by mouth daily.       . Glucosamine Sulfate-MSM (GLUCOSAMINE-MSM DS PO) Take 1 tablet by mouth daily.      Marland Kitchen ibuprofen (ADVIL,MOTRIN) 200 MG tablet Take 400 mg by mouth every 8 (eight) hours as needed for pain.      Marland Kitchen lisinopril (PRINIVIL,ZESTRIL) 2.5 MG tablet Take 1 tablet (2.5 mg total) by mouth daily.  90 tablet  3  . loratadine (CLARITIN) 10 MG tablet Take 10 mg by mouth daily.      . Multiple Vitamin (MULTIVITAMIN WITH MINERALS) TABS Take 1 tablet by mouth daily.      . nitroGLYCERIN (NITRODUR - DOSED IN MG/24 HR) 0.2 mg/hr Place 1 patch (0.2 mg total) onto the skin daily.  90 patch  3  . rosuvastatin (CRESTOR) 5 MG tablet 1 tablet every other day or as directed.  90 tablet  3  . amoxicillin (AMOXIL) 500 MG capsule Take 1 capsule (500 mg total) by mouth 3 (three) times daily.  20 capsule  0   No current facility-administered medications for this visit.    Allergies  Allergen Reactions  . Losartan Potassium Palpitations  . Clopidogrel Bisulfate Hives  .  Epinephrine Other (See Comments)    Elevated heartbeat  . Iodine Hives    "Renografin"  . Losartan Other (See Comments)    Irregular heartbeat  . Metoprolol Other (See Comments)    Irregular heartbeat  . Mydriacyl [Tropicamide] Other (See Comments)    Elevated heartbeat  . Phenylephrine Hcl Other (See Comments)    Increased heartrate  . Statins Other (See Comments)    Muscle cramps    Patient Active Problem List   Diagnosis Date Noted  . CORONARY ATHEROSCLEROSIS 08/10/2009    Priority: High  . PVC's (premature ventricular contractions) 05/10/2011    Priority: Medium  . HYPERLIPIDEMIA, MIXED 08/10/2009    Priority: Medium  . AAA (abdominal aortic aneurysm) 12/06/2013    . Implantable defibrillator check 09/25/2013  . Weight gain 12/24/2012  . Sinusitis 12/24/2012  . Abdominal aneurysm without mention of rupture 11/13/2012  . Ischemic cardiomyopathy 08/28/2012  . Paroxysmal ventricular tachycardia 03/22/2012  . Single implantable cardioverter-defibrillator [employer] 03/22/2012  . Abdominal aortic aneurysm 05/10/2011  . VENTRICULAR FIBRILLATION 08/10/2009    History  Smoking status  . Former Smoker -- 1.00 packs/day for 20 years  . Types: Cigarettes  . Quit date: 12/23/1979  Smokeless tobacco  . Never Used    History  Alcohol Use No    Family History  Problem Relation Age of Onset  . Coronary artery disease      strong family history of   . Hyperlipidemia Mother   . Heart disease Father     before age 32  . Hyperlipidemia Father     Review of Systems: Constitutional: no fever chills diaphoresis or fatigue or change in weight.  Head and neck: no hearing loss, no epistaxis, no photophobia or visual disturbance. Respiratory: No cough, shortness of breath or wheezing. Cardiovascular: No chest pain peripheral edema, palpitations. Gastrointestinal: No abdominal distention, no abdominal pain, no change in bowel habits hematochezia or melena. Genitourinary: No dysuria, no frequency, no urgency, no nocturia. Musculoskeletal:No arthralgias, no back pain, no gait disturbance or myalgias. Neurological: No dizziness, no headaches, no numbness, no seizures, no syncope, no weakness, no tremors. Hematologic: No lymphadenopathy, no easy bruising. Psychiatric: No confusion, no hallucinations, no sleep disturbance.    Physical Exam: Filed Vitals:   03/05/14 0902  BP: 117/66  Pulse: 60   the general appearance reveals a well-developed well-nourished Caucasian gentleman in no distress.The head and neck exam reveals pupils equal and reactive.  Extraocular movements are full.  There is no scleral icterus.  The mouth and pharynx are normal.  The neck is  supple.  The carotids reveal no bruits.  The jugular venous pressure is normal.  The  thyroid is not enlarged.  There is no lymphadenopathy.  The chest is clear to percussion and auscultation.  There are no rales or rhonchi.  Expansion of the chest is symmetrical.  The precordium is quiet.  The first heart sound is normal.  The second heart sound is physiologically split.  There is no murmur gallop rub or click.  There is no abnormal lift or heave.  The abdomen is soft and nontender.  The bowel sounds are normal.  The liver and spleen are not enlarged.  There are no abdominal masses.  There are no abdominal bruits.  Extremities reveal good pedal pulses.  There is no phlebitis or edema.  There is no cyanosis or clubbing.  Strength is normal and symmetrical in all extremities.  There is no lateralizing weakness.  There are no  sensory deficits.  The skin is warm and dry.  There is no rash.    Assessment / Plan: Continue on same diet.  Continue weight loss on weight watchers diet.  Continue same medications.  Recheck in 4 months for followup office visit lipid panel hepatic function panel and basal metabolic panel.

## 2014-03-05 NOTE — Assessment & Plan Note (Signed)
The patient has a history of dyslipidemia.  He is back on a Weight Watchers diet.  His weight is down 6 pounds since last visit.  We reviewed his blood work from last week which was satisfactory.

## 2014-03-05 NOTE — Assessment & Plan Note (Signed)
The patient underwent successful surgery with a stent graft by Dr. early on 12/06/13

## 2014-03-05 NOTE — Assessment & Plan Note (Signed)
The patient has had a cough productive of greenish sputum.  We will treat for bronchitis with amoxicillin 500 mg 3 times a day #20 and he will also use Mucinex

## 2014-03-05 NOTE — Patient Instructions (Signed)
Rx for Amoxicillin 500 mg three times a day until finished sent to [facility] wants you to follow-up in: 4 months with fasting labs (lp/bmet/hfp)  You will receive a reminder letter in the mail two months in advance. If you don't receive a letter, please call our office to schedule the follow-up appointment.

## 2014-03-28 ENCOUNTER — Encounter: Payer: Self-pay | Admitting: Internal Medicine

## 2014-03-28 ENCOUNTER — Ambulatory Visit (INDEPENDENT_AMBULATORY_CARE_PROVIDER_SITE_OTHER): Payer: Medicare Other | Admitting: *Deleted

## 2014-03-28 DIAGNOSIS — I2589 Other forms of chronic ischemic heart disease: Secondary | ICD-10-CM

## 2014-03-28 DIAGNOSIS — I4729 Other ventricular tachycardia: Secondary | ICD-10-CM

## 2014-03-28 DIAGNOSIS — I255 Ischemic cardiomyopathy: Secondary | ICD-10-CM

## 2014-03-28 DIAGNOSIS — I4901 Ventricular fibrillation: Secondary | ICD-10-CM

## 2014-03-28 DIAGNOSIS — I472 Ventricular tachycardia: Secondary | ICD-10-CM

## 2014-03-28 LAB — MDC_IDC_ENUM_SESS_TYPE_REMOTE
Battery Voltage: 2.57 V
Brady Statistic RV Percent Paced: 1 %
Date Time Interrogation Session: 20150417073111
HighPow Impedance: 47 Ohm
Lead Channel Impedance Value: 330 Ohm
Lead Channel Pacing Threshold Pulse Width: 0.6 ms
Lead Channel Sensing Intrinsic Amplitude: 7.1 mV
Lead Channel Setting Sensing Sensitivity: 0.3 mV
MDC IDC MSMT BATTERY REMAINING LONGEVITY: 50 mo
MDC IDC MSMT LEADCHNL RV PACING THRESHOLD AMPLITUDE: 1.5 V
MDC IDC PG SERIAL: 549899
MDC IDC SET LEADCHNL RV PACING AMPLITUDE: 2.5 V
MDC IDC SET LEADCHNL RV PACING PULSEWIDTH: 0.6 ms
MDC IDC SET ZONE DETECTION INTERVAL: 330 ms
Zone Setting Detection Interval: 250 ms
Zone Setting Detection Interval: 280 ms

## 2014-03-31 ENCOUNTER — Other Ambulatory Visit: Payer: Self-pay

## 2014-03-31 MED ORDER — NITROGLYCERIN 0.2 MG/HR TD PT24: 0.2000 mg | MEDICATED_PATCH | Freq: Every day | TRANSDERMAL | Status: DC

## 2014-04-09 ENCOUNTER — Other Ambulatory Visit: Payer: Self-pay

## 2014-04-09 DIAGNOSIS — E7800 Pure hypercholesterolemia, unspecified: Secondary | ICD-10-CM

## 2014-04-09 MED ORDER — COLESEVELAM HCL 625 MG PO TABS: 625.0000 mg | ORAL_TABLET | Freq: Two times a day (BID) | ORAL | Status: DC

## 2014-04-16 ENCOUNTER — Encounter: Payer: Self-pay | Admitting: Cardiology

## 2014-06-30 ENCOUNTER — Other Ambulatory Visit (INDEPENDENT_AMBULATORY_CARE_PROVIDER_SITE_OTHER): Payer: Medicare Other

## 2014-06-30 DIAGNOSIS — E782 Mixed hyperlipidemia: Secondary | ICD-10-CM

## 2014-06-30 LAB — BASIC METABOLIC PANEL
BUN: 23 mg/dL (ref 6–23)
CALCIUM: 8.8 mg/dL (ref 8.4–10.5)
CO2: 26 mEq/L (ref 19–32)
Chloride: 106 mEq/L (ref 96–112)
Creatinine, Ser: 1 mg/dL (ref 0.4–1.5)
GFR: 75.44 mL/min (ref >60.00)
GLUCOSE: 95 mg/dL (ref 70–99)
Potassium: 4 mEq/L (ref 3.5–5.1)
SODIUM: 139 meq/L (ref 135–145)

## 2014-06-30 LAB — LIPID PANEL
Cholesterol: 148 mg/dL (ref 0–200)
HDL: 34 mg/dL — ABNORMAL LOW (ref >39.00)
LDL Cholesterol: 71 mg/dL (ref 0–99)
NonHDL: 114
Total CHOL/HDL Ratio: 4
Triglycerides: 216 mg/dL — ABNORMAL HIGH (ref 0.0–149.0)
VLDL: 43.2 mg/dL — AB (ref 0.0–40.0)

## 2014-06-30 LAB — HEPATIC FUNCTION PANEL
ALT: 30 U/L (ref 0–53)
AST: 30 U/L (ref 0–37)
Albumin: 3.8 g/dL (ref 3.5–5.2)
Alkaline Phosphatase: 60 U/L (ref 39–117)
BILIRUBIN DIRECT: 0 mg/dL (ref 0.0–0.3)
BILIRUBIN TOTAL: 0.8 mg/dL (ref 0.2–1.2)
Total Protein: 6.8 g/dL (ref 6.0–8.3)

## 2014-07-01 ENCOUNTER — Ambulatory Visit (INDEPENDENT_AMBULATORY_CARE_PROVIDER_SITE_OTHER): Payer: Medicare Other | Admitting: *Deleted

## 2014-07-01 DIAGNOSIS — I472 Ventricular tachycardia, unspecified: Secondary | ICD-10-CM

## 2014-07-01 DIAGNOSIS — I4729 Other ventricular tachycardia: Secondary | ICD-10-CM

## 2014-07-01 NOTE — Progress Notes (Signed)
Remote ICD transmission.   

## 2014-07-07 ENCOUNTER — Encounter: Payer: Self-pay | Admitting: Cardiology

## 2014-07-07 ENCOUNTER — Ambulatory Visit (INDEPENDENT_AMBULATORY_CARE_PROVIDER_SITE_OTHER): Payer: Medicare Other | Admitting: Cardiology

## 2014-07-07 ENCOUNTER — Other Ambulatory Visit: Payer: Medicare Other

## 2014-07-07 VITALS — BP 116/62 | HR 94 | Ht 69.0 in | Wt 176.0 lb

## 2014-07-07 DIAGNOSIS — E782 Mixed hyperlipidemia: Secondary | ICD-10-CM

## 2014-07-07 DIAGNOSIS — I714 Abdominal aortic aneurysm, without rupture, unspecified: Secondary | ICD-10-CM

## 2014-07-07 DIAGNOSIS — E7800 Pure hypercholesterolemia, unspecified: Secondary | ICD-10-CM

## 2014-07-07 DIAGNOSIS — I255 Ischemic cardiomyopathy: Secondary | ICD-10-CM

## 2014-07-07 DIAGNOSIS — I4729 Other ventricular tachycardia: Secondary | ICD-10-CM

## 2014-07-07 DIAGNOSIS — I493 Ventricular premature depolarization: Secondary | ICD-10-CM

## 2014-07-07 DIAGNOSIS — I472 Ventricular tachycardia: Secondary | ICD-10-CM

## 2014-07-07 DIAGNOSIS — I4949 Other premature depolarization: Secondary | ICD-10-CM

## 2014-07-07 DIAGNOSIS — I2589 Other forms of chronic ischemic heart disease: Secondary | ICD-10-CM

## 2014-07-07 LAB — MDC_IDC_ENUM_SESS_TYPE_REMOTE
Battery Voltage: 2.56 V
Brady Statistic RV Percent Paced: 1 %
Date Time Interrogation Session: 20150721060217
HIGH POWER IMPEDANCE MEASURED VALUE: 48 Ohm
Implantable Pulse Generator Serial Number: 549899
Lead Channel Impedance Value: 340 Ohm
Lead Channel Setting Pacing Amplitude: 2.5 V
Lead Channel Setting Sensing Sensitivity: 0.3 mV
MDC IDC MSMT BATTERY REMAINING LONGEVITY: 28 mo
MDC IDC MSMT LEADCHNL RV SENSING INTR AMPL: 6.6 mV
MDC IDC SET LEADCHNL RV PACING PULSEWIDTH: 0.6 ms
MDC IDC SET ZONE DETECTION INTERVAL: 250 ms
Zone Setting Detection Interval: 280 ms
Zone Setting Detection Interval: 330 ms

## 2014-07-07 NOTE — Patient Instructions (Signed)
Your physician recommends that you continue on your current medications as directed. Please refer to the Current Medication list given to you today.  Your physician recommends that you schedule a follow-up appointment in: 4 months with fasting labs (lp/bmet/hfp) AND EKG

## 2014-07-07 NOTE — Assessment & Plan Note (Signed)
Patient has not been expressing any palpitations or irregular pulse

## 2014-07-07 NOTE — Assessment & Plan Note (Signed)
The patient has not had any recurrent chest pain or angina. 

## 2014-07-07 NOTE — Assessment & Plan Note (Signed)
Patient has a history of dyslipidemia.  We reviewed his recent labs which are satisfactory.  He is not having any myalgias from his low dose Crestor 5 mg every other day

## 2014-07-07 NOTE — Assessment & Plan Note (Signed)
Patient has a history of previous abdominal aortic aneurysm treated with successful stent graft on 12/06/13 by Dr. Donnetta Hutching.  No recent abdominal complaints.

## 2014-07-07 NOTE — Progress Notes (Signed)
Arthor Captain Date of Birth:  Jul 18, 1941 [facility] [address] [place], [place]  [address] [phone]        Fax   [phone]   History of Present Illness: This pleasant 73 year old gentleman is seen for a followup office visit. He has a complex past medical history. He has a history of known ischemic heart disease. He has a history of a remote myocardial infarction at the age of 61. That was a large posterolateral myocardial infarction in 1981. The patient had angioplasty and stent of his LAD in 1999. In 2009 he had an out of hospital cardiac arrest at home was successfully resuscitated and treated with hypothermia without significant neurologic sequelae. He now has a defibrillator in place and is followed by Dr. Caryl Comes. He has a history of hypercholesterolemia. His last nuclear stress test was 10/24/11 and showed an ejection fraction of 45% and a large posterolateral scar but no ischemia. His last echocardiogram was in 2009 and showed an ejection fraction of 35-40%. On 12/06/13 the patient underwent a stent graft for his abdominal aortic aneurysm. The patient did well and was discharged without complications. The patient has his defibrillator monitored through Dr. Olin Pia office. In January 2013 he had 2 episodes of ventricular tachycardia which were asymptomatic. In March 2014 he was rehospitalized after having 3 consecutive shocks from his defibrillator. He underwent cardiac catheterization by Dr. Burt Knack on 02/28/13 who told him that his arteries have not shown any progression of atherosclerosis since his previous cardiac catheterization in 2009.    Current Outpatient Prescriptions  Medication Sig Dispense Refill  . aspirin 81 MG tablet Take 81 mg by mouth daily.       Marland Kitchen atenolol (TENORMIN) 25 MG tablet Take 0.5 tablets (12.5 mg total) by mouth 2 (two) times daily.  90 tablet  3  . Cholecalciferol (VITAMIN D-3 PO) Take 300 mg by mouth daily.       .  colesevelam (WELCHOL) 625 MG tablet Take 1 tablet (625 mg total) by mouth 2 (two) times daily with a meal.  180 tablet  2  . diphenhydrAMINE (BENADRYL) 50 MG capsule Take 50 mg by mouth 1 hour prior to your procedure.CAT SCAN      . fish oil-omega-3 fatty acids 1000 MG capsule Take 1 g by mouth daily.       . Glucosamine Sulfate-MSM (GLUCOSAMINE-MSM DS PO) Take 1 tablet by mouth daily.      Marland Kitchen ibuprofen (ADVIL,MOTRIN) 200 MG tablet Take 400 mg by mouth every 8 (eight) hours as needed for pain.      Marland Kitchen lisinopril (PRINIVIL,ZESTRIL) 2.5 MG tablet Take 1 tablet (2.5 mg total) by mouth daily.  90 tablet  3  . loratadine (CLARITIN) 10 MG tablet Take 10 mg by mouth daily.      . Multiple Vitamin (MULTIVITAMIN WITH MINERALS) TABS Take 1 tablet by mouth daily.      . nitroGLYCERIN (NITRODUR - DOSED IN MG/24 HR) 0.2 mg/hr patch Place 1 patch (0.2 mg total) onto the skin daily.  90 patch  3  . rosuvastatin (CRESTOR) 5 MG tablet 1 tablet every other day or as directed.  90 tablet  3   No current facility-administered medications for this visit.    Allergies  Allergen Reactions  . Losartan Potassium Palpitations  . Clopidogrel Bisulfate Hives  . Epinephrine Other (See Comments)    Elevated heartbeat  . Iodine Hives    "Renografin"  . Losartan Other (  See Comments)    Irregular heartbeat  . Metoprolol Other (See Comments)    Irregular heartbeat  . Mydriacyl [Tropicamide] Other (See Comments)    Elevated heartbeat  . Phenylephrine Hcl Other (See Comments)    Increased heartrate  . Statins Other (See Comments)    Muscle cramps    Patient Active Problem List   Diagnosis Date Noted  . CORONARY ATHEROSCLEROSIS 08/10/2009    Priority: High  . PVC's (premature ventricular contractions) 05/10/2011    Priority: Medium  . HYPERLIPIDEMIA, MIXED 08/10/2009    Priority: Medium  . AAA (abdominal aortic aneurysm) 12/06/2013  . Implantable defibrillator check 09/25/2013  . Weight gain 12/24/2012  .  Sinusitis 12/24/2012  . Abdominal aneurysm without mention of rupture 11/13/2012  . Ischemic cardiomyopathy 08/28/2012  . Paroxysmal ventricular tachycardia 03/22/2012  . Single implantable cardioverter-defibrillator [employer] 03/22/2012  . Abdominal aortic aneurysm 05/10/2011  . VENTRICULAR FIBRILLATION 08/10/2009    History  Smoking status  . Former Smoker -- 1.00 packs/day for 20 years  . Types: Cigarettes  . Quit date: 12/23/1979  Smokeless tobacco  . Never Used    History  Alcohol Use No    Family History  Problem Relation Age of Onset  . Coronary artery disease      strong family history of   . Hyperlipidemia Mother   . Heart disease Father     before age 32  . Hyperlipidemia Father     Review of Systems: Constitutional: no fever chills diaphoresis or fatigue or change in weight.  Head and neck: no hearing loss, no epistaxis, no photophobia or visual disturbance. Respiratory: No cough, shortness of breath or wheezing. Cardiovascular: No chest pain peripheral edema, palpitations. Gastrointestinal: No abdominal distention, no abdominal pain, no change in bowel habits hematochezia or melena. Genitourinary: No dysuria, no frequency, no urgency, no nocturia. Musculoskeletal:No arthralgias, no back pain, no gait disturbance or myalgias. Neurological: No dizziness, no headaches, no numbness, no seizures, no syncope, no weakness, no tremors. Hematologic: No lymphadenopathy, no easy bruising. Psychiatric: No confusion, no hallucinations, no sleep disturbance.    Physical Exam: Filed Vitals:   07/07/14 0825  BP: 116/62  Pulse: 94   the general appearance reveals a well-developed well-nourished gentleman in no distress.The head and neck exam reveals pupils equal and reactive.  Extraocular movements are full.  There is no scleral icterus.  The mouth and pharynx are normal.  The neck is supple.  The carotids reveal no bruits.  The jugular venous pressure is normal.  The   thyroid is not enlarged.  There is no lymphadenopathy.  There is a defibrillator in the left upper chest.  The chest is clear to percussion and auscultation.  There are no rales or rhonchi.  Expansion of the chest is symmetrical.  The precordium is quiet.  The first heart sound is normal.  The second heart sound is physiologically split.  There is no murmur gallop rub or click.  There is no abnormal lift or heave.  The abdomen is soft and nontender.  The bowel sounds are normal.  The liver and spleen are not enlarged.  There are no abdominal masses.  There are no abdominal bruits.  Extremities reveal good pedal pulses.  There is no phlebitis or edema.  There is no cyanosis or clubbing.  Strength is normal and symmetrical in all extremities.  There is no lateralizing weakness.  There are no sensory deficits.  The skin is warm and dry.  There is no  rash.     Assessment / Plan: 1.  Ischemic heart disease status post remote posterolateral myocardial infarction in 1981.  Status post angioplasty and stent of LAD in 1999. 2. ICD in place following successful out of hospital ventricular fibrillation arrest and resuscitation in 2009 3. status post stent graft surgery for abdominal aortic aneurysm December 2014 4. dyslipidemia on low-dose Crestor  Disposition continue on same medication.  Recheck in 4 months for office visit EKG lipid panel hepatic function panel and basal metabolic panel.

## 2014-07-14 ENCOUNTER — Encounter: Payer: Self-pay | Admitting: Vascular Surgery

## 2014-07-15 ENCOUNTER — Other Ambulatory Visit: Payer: Medicare Other

## 2014-07-15 ENCOUNTER — Encounter: Payer: Self-pay | Admitting: Vascular Surgery

## 2014-07-15 ENCOUNTER — Ambulatory Visit (INDEPENDENT_AMBULATORY_CARE_PROVIDER_SITE_OTHER): Payer: Medicare Other | Admitting: Vascular Surgery

## 2014-07-15 VITALS — BP 139/74 | HR 57 | Resp 18 | Ht 69.0 in | Wt 179.3 lb

## 2014-07-15 DIAGNOSIS — Z48812 Encounter for surgical aftercare following surgery on the circulatory system: Secondary | ICD-10-CM

## 2014-07-15 DIAGNOSIS — I714 Abdominal aortic aneurysm, without rupture, unspecified: Secondary | ICD-10-CM

## 2014-07-15 NOTE — Addendum Note (Signed)
Addended by: Mena Goes on: 07/15/2014 01:47 PM   Modules accepted: Orders

## 2014-07-15 NOTE — Progress Notes (Signed)
Patient name: Joel Lin MRN: [id number] DOB: 1941-04-14 Sex: male     Reason for referral:  Chief Complaint  Patient presents with  . Follow-up    6 month FU with CTA abd/pelvis    . AAA    HISTORY OF PRESENT ILLNESS: Patient is a for followup of his stent graft repair of abdominal aortic aneurysm by myself on 12/06/2013. He underwent a CT scan today at [facility] and I have these films for review. He has no symptoms referable to his aneurysm. Also fortunately has had no new cardiac difficulty.  Past Medical History  Diagnosis Date  . Coronary artery disease     myoview 2011>>EF of 53% / large area of old inferolateral infarct but no reversible ischemia          . Cardiac arrest 03/29/2008    at home without resuscitation anywhere from 7-10 minutes before EMS arrived/ The patient was noted to be in ventricular fibrillation  required defibrillation x3               . Hypercoagulable state     Questionable history of a borderline hypercoagulable state  . Hyperlipidemia   . Anoxic brain damage      resolved  . Respiratory failure 2009    Respiratory failure and pulmonary collapse requiring intubation  . 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 PVCs (premature ventricular contractions)     occasionally  . Peripheral vascular disease   . AAA (abdominal aortic aneurysm)   . Contrast media allergy   . Myocardial infarction 1981  . Hemorrhoid   . Pacemaker   . Automatic implantable cardioverter-defibrillator in situ     Past Surgical History  Procedure Laterality Date  . Cholecystectomy, laparoscopic  08/13/2009    Calculous cholecystitis  . Insert / replace / remove pacemaker  04/07/2008      cardioverter defibrillator, [employer] Jude Single-chamber defibrillator implantation with intraoperative defibrillation threshold testing /  Durata 7121 dual-coil active defibrillator lead, serial  #[id number]   . Cardiac catheterization   04/04/2008     LAD stent patent, D1 OK, OM1 90/80/80% (15mm vessel), RCA 30%; EF 35%.  . Coronary angioplasty with stent placement  1999    PTCA and stenting of his left anterior descending artery  . Cholecystectomy    . Tonsillectomy    . Abdominal aortic endovascular stent graft N/A 12/06/2013    Procedure: ABDOMINAL AORTIC ENDOVASCULAR STENT GRAFT;  Surgeon: Rosetta Posner, MD;  Location: [facility] OR;  Service: Vascular;  Laterality: N/A;    History   Social History  . Marital Status: Married    Spouse Name: N/A    Number of Children: N/A  . Years of Education: N/A   Occupational History  . Retired Pharmacist, hospital    Social History Main Topics  . Smoking status: Former Smoker -- 1.00 packs/day for 20 years    Types: Cigarettes    Quit date: 12/23/1979  . Smokeless tobacco: Never Used  . Alcohol Use: No  . Drug Use: No  . Sexual Activity: Not on file   Other Topics Concern  . Not on file   Social History Narrative  . No narrative on file    Family History  Problem Relation Age of Onset  . Coronary artery disease      strong family history of   . Hyperlipidemia Mother   . Heart disease Father     before age 82  .  Hyperlipidemia Father     Allergies as of 07/15/2014 - Review Complete 07/15/2014  Allergen Reaction Noted  . Losartan potassium Palpitations 12/24/2012  . Clopidogrel bisulfate Hives 01/24/2011  . Epinephrine Other (See Comments) 04/16/2013  . Iodine Hives   . Losartan Other (See Comments) 12/03/2013  . Metoprolol Other (See Comments) 08/28/2012  . Mydriacyl [tropicamide] Other (See Comments) 04/16/2013  . Phenylephrine hcl Other (See Comments) 04/16/2013  . Statins Other (See Comments) 08/28/2012    Current Outpatient Prescriptions on File Prior to Visit  Medication Sig Dispense Refill  . aspirin 81 MG tablet Take 81 mg by mouth daily.       Marland Kitchen atenolol (TENORMIN) 25 MG tablet Take 0.5 tablets (12.5 mg total) by mouth 2 (two) times daily.  90 tablet  3  .  Cholecalciferol (VITAMIN D-3 PO) Take 300 mg by mouth daily.       . colesevelam (WELCHOL) 625 MG tablet Take 1 tablet (625 mg total) by mouth 2 (two) times daily with a meal.  180 tablet  2  . diphenhydrAMINE (BENADRYL) 50 MG capsule Take 50 mg by mouth 1 hour prior to your procedure.CAT SCAN      . fish oil-omega-3 fatty acids 1000 MG capsule Take 1 g by mouth daily.       . Glucosamine Sulfate-MSM (GLUCOSAMINE-MSM DS PO) Take 1 tablet by mouth daily.      Marland Kitchen ibuprofen (ADVIL,MOTRIN) 200 MG tablet Take 400 mg by mouth every 8 (eight) hours as needed for pain.      Marland Kitchen lisinopril (PRINIVIL,ZESTRIL) 2.5 MG tablet Take 1 tablet (2.5 mg total) by mouth daily.  90 tablet  3  . loratadine (CLARITIN) 10 MG tablet Take 10 mg by mouth daily.      . Multiple Vitamin (MULTIVITAMIN WITH MINERALS) TABS Take 1 tablet by mouth daily.      . nitroGLYCERIN (NITRODUR - DOSED IN MG/24 HR) 0.2 mg/hr patch Place 1 patch (0.2 mg total) onto the skin daily.  90 patch  3  . rosuvastatin (CRESTOR) 5 MG tablet 1 tablet every other day or as directed.  90 tablet  3   No current facility-administered medications on file prior to visit.       PHYSICAL EXAMINATION:  General: The patient is a well-nourished male, in no acute distress. Vital signs are BP 139/74  Pulse 57  Resp 18  Ht 5\' 9"  (1.753 m)  Wt 179 lb 4.8 oz (81.33 kg)  BMI 26.47 kg/m2 Pulmonary: There is a good air exchange Abdomen: Soft and non-tender with normal pitch bowel sounds. No aneurysm palpable Musculoskeletal: There are no major deformities.  There is no significant extremity pain. Neurologic: No focal weakness or paresthesias are detected, Skin: There are no ulcer or rashes noted. Psychiatric: The patient has normal affect. Cardiovascular: 2+ radial 2+ femoral 2+ popliteal 2+ posterior tibial pulses bilaterally with no evidence of peripheral artery aneurysms   CT scan today is reviewed. This shows no evidence of endoleak with good  positioning of the stent graft and no expansion sac size.  Impression and Plan:  Table status post stent graft repair of infrarenal abdominal aortic aneurysm. Continue usual activities. We'll see him in one year with ultrasound of his stent graft    Nastacia Raybuck [facility]: [phone]

## 2014-08-05 ENCOUNTER — Encounter: Payer: Self-pay | Admitting: Cardiology

## 2014-08-12 ENCOUNTER — Encounter: Payer: Self-pay | Admitting: Internal Medicine

## 2014-09-30 ENCOUNTER — Encounter: Payer: Self-pay | Admitting: Internal Medicine

## 2014-09-30 ENCOUNTER — Ambulatory Visit (INDEPENDENT_AMBULATORY_CARE_PROVIDER_SITE_OTHER): Payer: Medicare Other | Admitting: Internal Medicine

## 2014-09-30 VITALS — BP 124/76 | HR 59 | Ht 69.0 in | Wt 183.0 lb

## 2014-09-30 DIAGNOSIS — I255 Ischemic cardiomyopathy: Secondary | ICD-10-CM

## 2014-09-30 DIAGNOSIS — I472 Ventricular tachycardia: Secondary | ICD-10-CM

## 2014-09-30 DIAGNOSIS — I469 Cardiac arrest, cause unspecified: Secondary | ICD-10-CM

## 2014-09-30 DIAGNOSIS — I4901 Ventricular fibrillation: Secondary | ICD-10-CM

## 2014-09-30 DIAGNOSIS — Z4502 Encounter for adjustment and management of automatic implantable cardiac defibrillator: Secondary | ICD-10-CM

## 2014-09-30 DIAGNOSIS — I4729 Other ventricular tachycardia: Secondary | ICD-10-CM

## 2014-09-30 NOTE — Patient Instructions (Signed)
Remote monitoring is used to monitor your ICD from home. This monitoring reduces the number of office visits required to check your device to one time per year. It allows [place] to keep an eye on the functioning of your device to ensure it is working properly. You are scheduled for a device check from home on 01-01-2015. You may send your transmission at any time that day. If you have a wireless device, the transmission will be sent automatically. After your physician reviews your transmission, you will receive a postcard with your next transmission date.  Your physician recommends that you schedule a follow-up appointment in: 12 months with Dr.Klein

## 2014-09-30 NOTE — Progress Notes (Signed)
Patient Care Team: Kandice Hams, MD as PCP - General (Internal Medicine) Darlin Coco, MD as Attending Physician (Cardiology) Deboraha Sprang, MD as Attending Physician (Cardiology)   HPI  Joel Lin is a 73 y.o. male is seen in followup for aborted sudden cardiac death in the setting of ischemic heart disease with  prior PCI and mild depression of LV systolic function. He is status post ICD implantation.   The patient denies chest pain, shortness of breath, nocturnal dyspnea, orthopnea or peripheral edema. There have been no palpitations, lightheadedness or syncope.    There has been Myoview scan in May 13; ejection fraction had improved to 53%; previous IMI evident. Recent echo was corroborating   He underwent stent grafting of the abdominal aortic aneurysm  He has a history of prior inappropriate ICD discharges secondary to sinus tachycardia  He also has in 2013 ventricular tachycardia terminated with antitachycardia pacing;  this was unassociated with symptoms and was treated with increased dose of beta blockers      Past Medical History  Diagnosis Date  . Coronary artery disease     myoview 2011>>EF of 53% / large area of old inferolateral infarct but no reversible ischemia          . Cardiac arrest 03/29/2008    at home without resuscitation anywhere from 7-10 minutes before EMS arrived/ The patient was noted to be in ventricular fibrillation  required defibrillation x3               . Hypercoagulable state     Questionable history of a borderline hypercoagulable state  . Hyperlipidemia   . Anoxic brain damage      resolved  . Respiratory failure 2009    Respiratory failure and pulmonary collapse requiring intubation  . 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 PVCs (premature ventricular contractions)     occasionally  . Peripheral vascular disease   . AAA (abdominal aortic aneurysm)   . Contrast media allergy   .  Myocardial infarction 1981  . Hemorrhoid   . Pacemaker   . Automatic implantable cardioverter-defibrillator in situ     Past Surgical History  Procedure Laterality Date  . Cholecystectomy, laparoscopic  08/13/2009    Calculous cholecystitis  . Insert / replace / remove pacemaker  04/07/2008      cardioverter defibrillator, St. Jude Single-chamber defibrillator implantation with intraoperative defibrillation threshold testing /  Durata 7121 dual-coil active defibrillator lead, serial  #[id number]   . Cardiac catheterization  04/04/2008     LAD stent patent, D1 OK, OM1 90/80/80% (35mm vessel), RCA 30%; EF 35%.  . Coronary angioplasty with stent placement  1999    PTCA and stenting of his left anterior descending artery  . Cholecystectomy    . Tonsillectomy    . Abdominal aortic endovascular stent graft N/A 12/06/2013    Procedure: ABDOMINAL AORTIC ENDOVASCULAR STENT GRAFT;  Surgeon: Rosetta Posner, MD;  Location: [facility] OR;  Service: Vascular;  Laterality: N/A;    Current Outpatient Prescriptions  Medication Sig Dispense Refill  . aspirin 81 MG tablet Take 81 mg by mouth daily.       Marland Kitchen atenolol (TENORMIN) 25 MG tablet Take 0.5 tablets (12.5 mg total) by mouth 2 (two) times daily.  90 tablet  3  . Cholecalciferol (VITAMIN D-3 PO) Take 300 mg by mouth daily.       . colesevelam (WELCHOL) 625 MG tablet Take 1 tablet (  625 mg total) by mouth 2 (two) times daily with a meal.  180 tablet  2  . diphenhydrAMINE (BENADRYL) 50 MG capsule Take 50 mg by mouth 1 hour prior to your procedure.CAT SCAN      . Fexofenadine HCl (ALLEGRA PO) Take 10 mg by mouth daily.      . fish oil-omega-3 fatty acids 1000 MG capsule Take 1 g by mouth daily.       . Glucosamine Sulfate-MSM (GLUCOSAMINE-MSM DS PO) Take 1 tablet by mouth daily.      Marland Kitchen ibuprofen (ADVIL,MOTRIN) 200 MG tablet Take 400 mg by mouth every 8 (eight) hours as needed for pain.      Marland Kitchen lisinopril (PRINIVIL,ZESTRIL) 2.5 MG tablet Take 1 tablet (2.5 mg total)  by mouth daily.  90 tablet  3  . Multiple Vitamin (MULTIVITAMIN WITH MINERALS) TABS Take 1 tablet by mouth daily.      . nitroGLYCERIN (NITRODUR - DOSED IN MG/24 HR) 0.2 mg/hr patch Place 1 patch (0.2 mg total) onto the skin daily.  90 patch  3  . rosuvastatin (CRESTOR) 5 MG tablet 1 tablet every other day or as directed.  90 tablet  3   No current facility-administered medications for this visit.    Allergies  Allergen Reactions  . Losartan Other (See Comments)    Irregular heartbeat  . Losartan Potassium Palpitations  . Metoprolol Other (See Comments)    Irregular heartbeat  . Mydriacyl [Tropicamide] Other (See Comments)    Elevated heartbeat  . Phenylephrine Hcl Other (See Comments)    Increased heartrate  . Clopidogrel Bisulfate Hives    Hives   . Epinephrine Other (See Comments)    Elevated heartbeat  . Iodine Hives    "Renografin"  . Statins Other (See Comments)    Muscle cramps    Review of Systems negative except from HPI and PMH  Physical Exam BP 124/76  Pulse 59  Ht 5\' 9"  (1.753 m)  Wt 183 lb (83.008 kg)  BMI 27.01 kg/m2 Well developed and well nourished in no acute distress HENT normal E scleral and icterus clear Neck Supple JVP flat; carotids brisk and full Clear to ausculation Device pocket well healed; without hematoma or erythema.  There is no tethering Regular rate and rhythm, no murmurs gallops or rub Soft with active bowel sounds No clubbing cyanosis none Edema Alert and oriented, grossly normal motor and sensory function Skin Warm and Dry   ECG sinus rhythm at 57 Intervals 23/11/42 Otherwise normal   Assessment and  Plan  Aborted cardiac arrest  Ischemic Cardiomyopathy  Ventricular tachycardia  Implantable defibrillator The patient's device was interrogated.  The information was reviewed. No changes were made in the programming.     He is stable Without symptoms of ischemia  .No intercurrent Ventricular tachycardia

## 2014-10-01 LAB — MDC_IDC_ENUM_SESS_TYPE_INCLINIC
Battery Remaining Longevity: 30 mo
HIGH POWER IMPEDANCE MEASURED VALUE: 45.1798
Implantable Pulse Generator Serial Number: 549899
Lead Channel Pacing Threshold Amplitude: 1.25 V
Lead Channel Pacing Threshold Amplitude: 1.25 V
Lead Channel Pacing Threshold Pulse Width: 0.6 ms
Lead Channel Sensing Intrinsic Amplitude: 7.4 mV
Lead Channel Setting Pacing Amplitude: 2.5 V
Lead Channel Setting Sensing Sensitivity: 0.3 mV
MDC IDC MSMT BATTERY VOLTAGE: 2.56 V
MDC IDC MSMT LEADCHNL RV IMPEDANCE VALUE: 312.5 Ohm
MDC IDC MSMT LEADCHNL RV PACING THRESHOLD PULSEWIDTH: 0.6 ms
MDC IDC SESS DTM: 20151020201828
MDC IDC SET LEADCHNL RV PACING PULSEWIDTH: 0.6 ms
MDC IDC SET ZONE DETECTION INTERVAL: 250 ms
MDC IDC SET ZONE DETECTION INTERVAL: 330 ms
MDC IDC STAT BRADY RV PERCENT PACED: 0.25 %
Zone Setting Detection Interval: 280 ms

## 2014-10-28 ENCOUNTER — Other Ambulatory Visit (INDEPENDENT_AMBULATORY_CARE_PROVIDER_SITE_OTHER): Payer: Medicare Other | Admitting: *Deleted

## 2014-10-28 DIAGNOSIS — E7800 Pure hypercholesterolemia, unspecified: Secondary | ICD-10-CM

## 2014-10-29 LAB — HEPATIC FUNCTION PANEL
ALBUMIN: 3.8 g/dL (ref 3.5–5.2)
ALK PHOS: 59 U/L (ref 39–117)
ALT: 27 U/L (ref 0–53)
AST: 30 U/L (ref 0–37)
Bilirubin, Direct: 0.1 mg/dL (ref 0.0–0.3)
Total Bilirubin: 0.7 mg/dL (ref 0.2–1.2)
Total Protein: 6.7 g/dL (ref 6.0–8.3)

## 2014-10-29 LAB — BASIC METABOLIC PANEL
BUN: 19 mg/dL (ref 6–23)
CHLORIDE: 105 meq/L (ref 96–112)
CO2: 28 meq/L (ref 19–32)
CREATININE: 0.9 mg/dL (ref 0.4–1.5)
Calcium: 8.9 mg/dL (ref 8.4–10.5)
GFR: 83.76 mL/min (ref >60.00)
GLUCOSE: 96 mg/dL (ref 70–99)
Potassium: 4.4 mEq/L (ref 3.5–5.1)
Sodium: 138 mEq/L (ref 135–145)

## 2014-10-29 LAB — LIPID PANEL
Cholesterol: 137 mg/dL (ref 0–200)
HDL: 34.4 mg/dL — AB (ref >39.00)
LDL Cholesterol: 88 mg/dL (ref 0–99)
NONHDL: 102.6
Total CHOL/HDL Ratio: 4
Triglycerides: 71 mg/dL (ref 0.0–149.0)
VLDL: 14.2 mg/dL (ref 0.0–40.0)

## 2014-10-29 NOTE — Progress Notes (Signed)
Quick Note:  Please make copy of labs for patient visit. ______ 

## 2014-11-04 ENCOUNTER — Ambulatory Visit (INDEPENDENT_AMBULATORY_CARE_PROVIDER_SITE_OTHER): Payer: Medicare Other | Admitting: Cardiology

## 2014-11-04 VITALS — BP 116/64 | HR 57 | Ht 69.0 in | Wt 178.0 lb

## 2014-11-04 DIAGNOSIS — I2510 Atherosclerotic heart disease of native coronary artery without angina pectoris: Secondary | ICD-10-CM

## 2014-11-04 DIAGNOSIS — Z4502 Encounter for adjustment and management of automatic implantable cardiac defibrillator: Secondary | ICD-10-CM

## 2014-11-04 DIAGNOSIS — I4901 Ventricular fibrillation: Secondary | ICD-10-CM

## 2014-11-04 DIAGNOSIS — I255 Ischemic cardiomyopathy: Secondary | ICD-10-CM

## 2014-11-04 DIAGNOSIS — E782 Mixed hyperlipidemia: Secondary | ICD-10-CM

## 2014-11-04 NOTE — Assessment & Plan Note (Signed)
The patient has had no chest pain or angina

## 2014-11-04 NOTE — Assessment & Plan Note (Signed)
Patient's lipids were reviewed and are satisfactory.

## 2014-11-04 NOTE — Patient Instructions (Signed)
Your physician recommends that you continue on your current medications as directed. Please refer to the Current Medication list given to you today.  Your physician recommends that you schedule a follow-up appointment in: 4 months with fasting labs (lp/bmet/hfp)  

## 2014-11-04 NOTE — Progress Notes (Signed)
Arthor Captain Date of Birth:  10-Mar-1941 [facility] [address] [place], [place]  [address] [phone]        Fax   [phone]   History of Present Illness: This pleasant 73 year old gentleman is seen for a followup office visit. He has a complex past medical history. He has a history of known ischemic heart disease. He has a history of a remote myocardial infarction at the age of 66. That was a large posterolateral myocardial infarction in 1981. The patient had angioplasty and stent of his LAD in 1999. In 2009 he had an out of hospital cardiac arrest at home was successfully resuscitated and treated with hypothermia without significant neurologic sequelae. He now has a defibrillator in place and is followed by Dr. Caryl Comes. He has a history of hypercholesterolemia. His last nuclear stress test was 10/24/11 and showed an ejection fraction of 45% and a large posterolateral scar but no ischemia. His last echocardiogram was in 2009 and showed an ejection fraction of 35-40%. On 12/06/13 the patient underwent a stent graft for his abdominal aortic aneurysm. The patient did well and was discharged without complications. The patient has his defibrillator monitored through Dr. Olin Pia office. In January 2013 he had 2 episodes of ventricular tachycardia which were asymptomatic. In March 2014 he was rehospitalized after having 3 consecutive shocks from his defibrillator. He underwent cardiac catheterization by Dr. Burt Knack on 02/28/13 who told him that his arteries have not shown any progression of atherosclerosis since his previous cardiac catheterization in 2009.   since we last saw him he has completely retired.  He has been playing golf 18 holes every other week. He is not having any difficulty with shortness of breath or chest pain.   Current Outpatient Prescriptions  Medication Sig Dispense Refill  . aspirin 81 MG tablet Take 81 mg by mouth daily.     Marland Kitchen atenolol (TENORMIN)  25 MG tablet Take 0.5 tablets (12.5 mg total) by mouth 2 (two) times daily. 90 tablet 3  . Cholecalciferol (VITAMIN D-3 PO) Take 300 mg by mouth daily.     . colesevelam (WELCHOL) 625 MG tablet Take 1 tablet (625 mg total) by mouth 2 (two) times daily with a meal. 180 tablet 2  . diphenhydrAMINE (BENADRYL) 50 MG capsule Take 50 mg by mouth 1 hour prior to your procedure.CAT SCAN    . fish oil-omega-3 fatty acids 1000 MG capsule Take 1 g by mouth daily.     . Glucosamine Sulfate-MSM (GLUCOSAMINE-MSM DS PO) Take 1 tablet by mouth daily.    Marland Kitchen ibuprofen (ADVIL,MOTRIN) 200 MG tablet Take 400 mg by mouth every 8 (eight) hours as needed for pain.    Marland Kitchen lisinopril (PRINIVIL,ZESTRIL) 2.5 MG tablet Take 1 tablet (2.5 mg total) by mouth daily. 90 tablet 3  . loratadine (CLARITIN) 10 MG tablet Take 10 mg by mouth daily.    . Multiple Vitamin (MULTIVITAMIN WITH MINERALS) TABS Take 1 tablet by mouth daily.    . nitroGLYCERIN (NITRODUR - DOSED IN MG/24 HR) 0.2 mg/hr patch Place 1 patch (0.2 mg total) onto the skin daily. 90 patch 3  . rosuvastatin (CRESTOR) 5 MG tablet 1 tablet every other day or as directed. 90 tablet 3   No current facility-administered medications for this visit.    Allergies  Allergen Reactions  . Losartan Other (See Comments)    Irregular heartbeat  . Losartan Potassium Palpitations  . Metoprolol Other (See Comments)  Irregular heartbeat  . Mydriacyl [Tropicamide] Other (See Comments)    Elevated heartbeat  . Phenylephrine Hcl Other (See Comments)    Increased heartrate  . Clopidogrel Bisulfate Hives    Hives   . Epinephrine Other (See Comments)    Elevated heartbeat  . Iodine Hives    "Renografin"  . Statins Other (See Comments)    Muscle cramps    Patient Active Problem List   Diagnosis Date Noted  . CORONARY ATHEROSCLEROSIS 08/10/2009    Priority: High  . PVC's (premature ventricular contractions) 05/10/2011    Priority: Medium  . HYPERLIPIDEMIA, MIXED  08/10/2009    Priority: Medium  . Aftercare following surgery of the circulatory system, [place] 07/15/2014  . AAA (abdominal aortic aneurysm) 12/06/2013  . Implantable defibrillator check 09/25/2013  . Weight gain 12/24/2012  . Sinusitis 12/24/2012  . Abdominal aneurysm without mention of rupture 11/13/2012  . Ischemic cardiomyopathy 08/28/2012  . Paroxysmal ventricular tachycardia 03/22/2012  . Single implantable cardioverter-defibrillator [employer] 03/22/2012  . Abdominal aortic aneurysm 05/10/2011  . VENTRICULAR FIBRILLATION 08/10/2009    History  Smoking status  . Former Smoker -- 1.00 packs/day for 20 years  . Types: Cigarettes  . Quit date: 12/23/1979  Smokeless tobacco  . Never Used    History  Alcohol Use No    Family History  Problem Relation Age of Onset  . Coronary artery disease      strong family history of   . Hyperlipidemia Mother   . Heart disease Father     before age 71  . Hyperlipidemia Father     Review of Systems: Constitutional: no fever chills diaphoresis or fatigue or change in weight.  Head and neck: no hearing loss, no epistaxis, no photophobia or visual disturbance. Respiratory: No cough, shortness of breath or wheezing. Cardiovascular: No chest pain peripheral edema, palpitations. Gastrointestinal: No abdominal distention, no abdominal pain, no change in bowel habits hematochezia or melena. Genitourinary: No dysuria, no frequency, no urgency, no nocturia. Musculoskeletal:No arthralgias, no back pain, no gait disturbance or myalgias. Neurological: No dizziness, no headaches, no numbness, no seizures, no syncope, no weakness, no tremors. Hematologic: No lymphadenopathy, no easy bruising. Psychiatric: No confusion, no hallucinations, no sleep disturbance.    Physical Exam: Filed Vitals:   11/04/14 0837  BP: 116/64  Pulse: 57   the general appearance reveals a well-developed well-nourished gentleman in no distress.The head and neck exam  reveals pupils equal and reactive.  Extraocular movements are full.  There is no scleral icterus.  The mouth and pharynx are normal.  The neck is supple.  The carotids reveal no bruits.  The jugular venous pressure is normal.  The  thyroid is not enlarged.  There is no lymphadenopathy.  There is a defibrillator in the left upper chest.  The chest is clear to percussion and auscultation.  There are no rales or rhonchi.  Expansion of the chest is symmetrical.  The precordium is quiet.  The first heart sound is normal.  The second heart sound is physiologically split.  There is no murmur gallop rub or click.  There is no abnormal lift or heave.  The abdomen is soft and nontender.  The bowel sounds are normal.  The liver and spleen are not enlarged.  There are no abdominal masses.  There are no abdominal bruits.  Extremities reveal good pedal pulses.  There is no phlebitis or edema.  There is no cyanosis or clubbing.  Strength is normal and symmetrical in all  extremities.  There is no lateralizing weakness.  There are no sensory deficits.  The skin is warm and dry.  There is no rash.   EKG today shows sinus bradycardia with first-degree AV block and pattern of a old inferolateral myocardial infarction   Assessment / Plan: 1.  Ischemic heart disease status post remote posterolateral myocardial infarction in 1981.  Status post angioplasty and stent of LAD in 1999. 2. ICD in place following successful out of hospital ventricular fibrillation arrest and resuscitation in 2009 3. status post stent graft surgery for abdominal aortic aneurysm December 2014 4. dyslipidemia on low-dose Crestor  Disposition continue on same medication.  Recheck in 4 months for office visit  lipid panel hepatic function panel and basal metabolic panel.

## 2014-11-04 NOTE — Assessment & Plan Note (Signed)
The patient has not been aware of any shocks from his defibrillator.

## 2014-11-20 ENCOUNTER — Encounter (HOSPITAL_COMMUNITY): Payer: Self-pay | Admitting: Cardiovascular Disease

## 2015-01-01 ENCOUNTER — Ambulatory Visit (INDEPENDENT_AMBULATORY_CARE_PROVIDER_SITE_OTHER): Payer: Medicare Other | Admitting: *Deleted

## 2015-01-01 ENCOUNTER — Encounter: Payer: Self-pay | Admitting: Internal Medicine

## 2015-01-01 DIAGNOSIS — I469 Cardiac arrest, cause unspecified: Secondary | ICD-10-CM

## 2015-01-01 LAB — MDC_IDC_ENUM_SESS_TYPE_REMOTE
Battery Remaining Longevity: 28 mo
Battery Voltage: 2.56 V
Brady Statistic RV Percent Paced: 1 %
Date Time Interrogation Session: 20160121070332
HIGH POWER IMPEDANCE MEASURED VALUE: 43 Ohm
Implantable Pulse Generator Serial Number: 549899
Lead Channel Pacing Threshold Amplitude: 1.25 V
Lead Channel Setting Pacing Pulse Width: 0.6 ms
Lead Channel Setting Sensing Sensitivity: 0.3 mV
MDC IDC MSMT LEADCHNL RV IMPEDANCE VALUE: 290 Ohm
MDC IDC MSMT LEADCHNL RV PACING THRESHOLD PULSEWIDTH: 0.6 ms
MDC IDC MSMT LEADCHNL RV SENSING INTR AMPL: 5.8 mV
MDC IDC SET LEADCHNL RV PACING AMPLITUDE: 2.5 V
MDC IDC SET ZONE DETECTION INTERVAL: 280 ms
Zone Setting Detection Interval: 250 ms
Zone Setting Detection Interval: 330 ms

## 2015-01-01 NOTE — Progress Notes (Signed)
Remote ICD transmission.   

## 2015-01-19 ENCOUNTER — Encounter: Payer: Self-pay | Admitting: Cardiology

## 2015-01-20 ENCOUNTER — Other Ambulatory Visit: Payer: Self-pay | Admitting: Cardiology

## 2015-02-08 ENCOUNTER — Emergency Department (HOSPITAL_COMMUNITY): Payer: Medicare Other

## 2015-02-08 ENCOUNTER — Emergency Department (HOSPITAL_COMMUNITY)
Admission: EM | Admit: 2015-02-08 | Discharge: 2015-02-08 | Disposition: A | Discharge status: Home / Self Care | Payer: Medicare Other | Attending: Emergency Medicine | Admitting: Emergency Medicine

## 2015-02-08 ENCOUNTER — Telehealth: Payer: Self-pay | Admitting: Physician Assistant

## 2015-02-08 ENCOUNTER — Encounter (HOSPITAL_COMMUNITY): Payer: Self-pay | Admitting: Emergency Medicine

## 2015-02-08 DIAGNOSIS — I493 Ventricular premature depolarization: Secondary | ICD-10-CM | POA: Diagnosis not present

## 2015-02-08 DIAGNOSIS — Z7982 Long term (current) use of aspirin: Secondary | ICD-10-CM | POA: Insufficient documentation

## 2015-02-08 DIAGNOSIS — Z9861 Coronary angioplasty status: Secondary | ICD-10-CM | POA: Diagnosis not present

## 2015-02-08 DIAGNOSIS — T82198A Other mechanical complication of other cardiac electronic device, initial encounter: Secondary | ICD-10-CM | POA: Diagnosis not present

## 2015-02-08 DIAGNOSIS — I2510 Atherosclerotic heart disease of native coronary artery without angina pectoris: Secondary | ICD-10-CM | POA: Diagnosis not present

## 2015-02-08 DIAGNOSIS — I252 Old myocardial infarction: Secondary | ICD-10-CM | POA: Diagnosis not present

## 2015-02-08 DIAGNOSIS — Z8719 Personal history of other diseases of the digestive system: Secondary | ICD-10-CM | POA: Insufficient documentation

## 2015-02-08 DIAGNOSIS — Y831 Surgical operation with implant of artificial internal device as the cause of abnormal reaction of the patient, or of later complication, without mention of misadventure at the time of the procedure: Secondary | ICD-10-CM | POA: Diagnosis not present

## 2015-02-08 DIAGNOSIS — Z87891 Personal history of nicotine dependence: Secondary | ICD-10-CM | POA: Diagnosis not present

## 2015-02-08 DIAGNOSIS — Z8669 Personal history of other diseases of the nervous system and sense organs: Secondary | ICD-10-CM | POA: Insufficient documentation

## 2015-02-08 DIAGNOSIS — R0789 Other chest pain: Secondary | ICD-10-CM

## 2015-02-08 DIAGNOSIS — Z8709 Personal history of other diseases of the respiratory system: Secondary | ICD-10-CM | POA: Diagnosis not present

## 2015-02-08 DIAGNOSIS — Z79899 Other long term (current) drug therapy: Secondary | ICD-10-CM | POA: Diagnosis not present

## 2015-02-08 DIAGNOSIS — E785 Hyperlipidemia, unspecified: Secondary | ICD-10-CM | POA: Diagnosis not present

## 2015-02-08 DIAGNOSIS — Z9889 Other specified postprocedural states: Secondary | ICD-10-CM | POA: Diagnosis not present

## 2015-02-08 DIAGNOSIS — T829XXA Unspecified complication of cardiac and vascular prosthetic device, implant and graft, initial encounter: Secondary | ICD-10-CM

## 2015-02-08 LAB — CBC WITH DIFFERENTIAL/PLATELET
Basophils Absolute: 0 10*3/uL (ref 0.0–0.1)
Basophils Relative: 0 % (ref 0–1)
Eosinophils Absolute: 0.1 10*3/uL (ref 0.0–0.7)
Eosinophils Relative: 1 % (ref 0–5)
HCT: 45.8 % (ref 39.0–52.0)
Hemoglobin: 15.1 g/dL (ref 13.0–17.0)
Lymphocytes Relative: 20 % (ref 12–46)
Lymphs Abs: 1.3 10*3/uL (ref 0.7–4.0)
MCH: 27.9 pg (ref 26.0–34.0)
MCHC: 33 g/dL (ref 30.0–36.0)
MCV: 84.5 fL (ref 78.0–100.0)
Monocytes Absolute: 0.6 10*3/uL (ref 0.1–1.0)
Monocytes Relative: 9 % (ref 3–12)
Neutro Abs: 4.5 10*3/uL (ref 1.7–7.7)
Neutrophils Relative %: 70 % (ref 43–77)
Platelets: 135 10*3/uL — ABNORMAL LOW (ref 150–400)
RBC: 5.42 MIL/uL (ref 4.22–5.81)
RDW: 13.5 % (ref 11.5–15.5)
WBC: 6.4 10*3/uL (ref 4.0–10.5)

## 2015-02-08 LAB — COMPREHENSIVE METABOLIC PANEL
ALT: 41 U/L (ref 0–53)
AST: 40 U/L — AB (ref 0–37)
Albumin: 3.8 g/dL (ref 3.5–5.2)
Alkaline Phosphatase: 65 U/L (ref 39–117)
Anion gap: 5 (ref 5–15)
BUN: 20 mg/dL (ref 6–23)
CALCIUM: 9.4 mg/dL (ref 8.4–10.5)
CHLORIDE: 105 mmol/L (ref 96–112)
CO2: 30 mmol/L (ref 19–32)
Creatinine, Ser: 1.04 mg/dL (ref 0.50–1.35)
GFR calc Af Amer: 81 mL/min — ABNORMAL LOW (ref >90)
GFR, EST NON AFRICAN AMERICAN: 70 mL/min — AB (ref >90)
Glucose, Bld: 112 mg/dL — ABNORMAL HIGH (ref 70–99)
POTASSIUM: 4.3 mmol/L (ref 3.5–5.1)
Sodium: 140 mmol/L (ref 135–145)
Total Protein: 7 g/dL (ref 6.0–8.3)

## 2015-02-08 LAB — I-STAT TROPONIN, ED: Troponin i, poc: 0.01 ng/mL (ref 0.00–0.08)

## 2015-02-08 LAB — TROPONIN I: Troponin I: <0.03 ng/mL

## 2015-02-08 LAB — PHOSPHORUS: Phosphorus: 3 mg/dL (ref 2.3–4.6)

## 2015-02-08 LAB — MAGNESIUM: Magnesium: 2.1 mg/dL (ref 1.5–2.5)

## 2015-02-08 NOTE — ED Notes (Signed)
Pt sts his pacemaker went off twice last night, and once this morning. Pt had pacemaker placed in April 2009.

## 2015-02-08 NOTE — Telephone Encounter (Signed)
Pt called because his defibrillator is buzzing him. He thinks his HR is irregular and he has not slept much because of these symptoms.  [employer] Jude ICD  Advised pt he should be seen in the ER at [facility], pt states he will come.  Rosaria Ferries, PA-C 02/08/2015 7:16 AM Beeper [phone]

## 2015-02-08 NOTE — ED Provider Notes (Addendum)
CSN: [id number]     Arrival date & time 02/08/15  1700 History   First MD Initiated Contact with Patient 02/08/15 [phone]     Chief Complaint  Patient presents with  . Pacemaker Problem     (Consider location/radiation/quality/duration/timing/severity/associated sxs/prior Treatment) HPI Comments: Patient with a history of coronary artery disease with MI in 1981, cardiac arrest in 2009 thought to be from scar tissue as he had a V. fib arrest, AAA who has an implantable cardiac defibrillator and pacemaker from [place] who presents today after he states pacemaker fired 3 times since 11 PM last night. Patient states yesterday he was in his normal state of health when for a walk and felt his normal self until around 11 PM when he felt a sharp tinge over his pacemaker. At that time he noted his pulse to be irregular and slow it then fired a second time and he went to bed. This morning it fired a third time at 5 AM. He denies feeling short of breath, chest pain, lower extremity swelling, nausea, abdominal pain. No recent medication changes however he did recently finish a course of Augmentin.  The history is provided by the patient.    Past Medical History  Diagnosis Date  . Coronary artery disease     myoview 2011>>EF of 53% / large area of old inferolateral infarct but no reversible ischemia          . Cardiac arrest 03/29/2008    at home without resuscitation anywhere from 7-10 minutes before EMS arrived/ The patient was noted to be in ventricular fibrillation  required defibrillation x3               . Hypercoagulable state     Questionable history of a borderline hypercoagulable state  . Hyperlipidemia   . Anoxic brain damage      resolved  . Respiratory failure 2009    Respiratory failure and pulmonary collapse requiring intubation  . 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 PVCs (premature ventricular contractions)     occasionally  . Peripheral vascular  disease   . AAA (abdominal aortic aneurysm)   . Contrast media allergy   . Myocardial infarction 1981  . Hemorrhoid   . Pacemaker   . Automatic implantable cardioverter-defibrillator in situ    Past Surgical History  Procedure Laterality Date  . Cholecystectomy, laparoscopic  08/13/2009    Calculous cholecystitis  . Insert / replace / remove pacemaker  04/07/2008      cardioverter defibrillator, [employer] Single-chamber defibrillator implantation with intraoperative defibrillation threshold testing /  [employer]ata 7121 dual-coil active defibrillator lead, serial  #[id number]   . Cardiac catheterization  04/04/2008     LAD stent patent, D1 OK, OM1 90/80/80% (45mm vessel), RCA 30%; EF 35%.  . Coronary angioplasty with stent placement  1999    PTCA and stenting of his left anterior descending artery  . Cholecystectomy    . Tonsillectomy    . Abdominal aortic endovascular stent graft N/A 12/06/2013    Procedure: ABDOMINAL AORTIC ENDOVASCULAR STENT GRAFT;  Surgeon: Rosetta Posner, MD;  Location: [facility];  Service: Vascular;  Laterality: N/A;  . Left heart catheterization with coronary angiogram N/A 02/28/2013    Procedure: LEFT HEART CATHETERIZATION WITH CORONARY ANGIOGRAM;  Surgeon: Sherren Mocha, MD;  Location[facility] C[facility] LAB;  Service: Cardiovascular;  Laterality: N/A;   Family History  Problem Relation Age of Onset  . Coronary artery disease  strong family history of   . Hyperlipidemia Mother   . Heart disease Father     before age 60  . Hyperlipidemia Father    History  Substance Use Topics  . Smoking status: Former Smoker -- 1.00 packs/day for 20 years    Types: Cigarettes    Quit date: 12/23/1979  . Smokeless tobacco: Never Used  . Alcohol Use: No    Review of Systems  All other systems reviewed and are negative.     Allergies  Losartan; Losartan potassium; Metoprolol; Mydriacyl; Phenylephrine hcl; Clopidogrel bisulfate; Epinephrine; Iodine; and Statins  Home Medications    Prior to Admission medications   Medication Sig Start Date End Date Taking? Authorizing Provider  aspirin 81 MG tablet Take 81 mg by mouth daily.     Historical Provider, MD  atenolol (TENORMIN) 25 MG tablet Take one-half tablet by  mouth two times daily 01/21/15   Darlin Coco, MD  Cholecalciferol (VITAMIN D-3 PO) Take 300 mg by mouth daily.     Historical Provider, MD  colesevelam (WELCHOL) 625 MG tablet Take 1 tablet (625 mg total) by mouth 2 (two) times daily with a meal. 04/09/14   Darlin Coco, MD  diphenhydrAMINE (BENADRYL) 50 MG capsule Take 50 mg by mouth 1 hour prior to your procedure.CAT SCAN 12/10/13   Rosetta Posner, MD  fish oil-omega-3 fatty acids 1000 MG capsule Take 1 g by mouth daily.     Historical Provider, MD  Glucosamine Sulfate-MSM (GLUCOSAMINE-MSM DS PO) Take 1 tablet by mouth daily.    Historical Provider, MD  ibuprofen (ADVIL,MOTRIN) 200 MG tablet Take 400 mg by mouth every 8 (eight) hours as needed for pain.    Historical Provider, MD  lisinopril (PRINIVIL,ZESTRIL) 2.5 MG tablet Take 1 tablet by mouth  daily 01/21/15   Darlin Coco, MD  loratadine (CLARITIN) 10 MG tablet Take 10 mg by mouth daily.    Historical Provider, MD  Multiple Vitamin (MULTIVITAMIN WITH MINERALS) TABS Take 1 tablet by mouth daily.    Historical Provider, MD  nitroGLYCERIN (NITRODUR - DOSED IN MG/24 HR) 0.2 mg/hr patch Place 1 patch (0.2 mg total) onto the skin daily. 03/31/14   Darlin Coco, MD  rosuvastatin (CRESTOR) 5 MG tablet 1 tablet every other day or as directed. 02/10/14   Darlin Coco, MD   BP 159/77 mmHg  Pulse 67  Temp(Src) 98.4 F (36.9 C) (Oral)  Resp 18  Ht 5\' 9"  (1.753 m)  Wt 180 lb (81.647 kg)  BMI 26.57 kg/m2  SpO2 98% Physical Exam  Constitutional: He is oriented to person, place, and time. He appears well-developed and well-nourished. No distress.  HENT:  Head: Normocephalic and atraumatic.  Mouth/Throat: Oropharynx is clear and moist.  Eyes:  Conjunctivae and EOM are normal. Pupils are equal, round, and reactive to light.  Neck: Normal range of motion. Neck supple.  Cardiovascular: Normal rate, regular rhythm and intact distal pulses.   No murmur heard. Pulmonary/Chest: Effort normal and breath sounds normal. No respiratory distress. He has no wheezes. He has no rales.  Pacemaker in Left upper chest  Abdominal: Soft. He exhibits no distension. There is no tenderness. There is no rebound and no guarding.  Musculoskeletal: Normal range of motion. He exhibits no edema or tenderness.  Neurological: He is alert and oriented to person, place, and time.  Skin: Skin is warm and dry. No rash noted. No erythema.  Psychiatric: He has a normal mood and affect. His behavior is normal.  Nursing  note and vitals reviewed.   ED Course  Procedures (including critical care time) Labs Review Labs Reviewed  CBC WITH DIFFERENTIAL/PLATELET - Abnormal; Notable for the following:    Platelets 135 (*)    All other components within normal limits  COMPREHENSIVE METABOLIC PANEL - Abnormal; Notable for the following:    Glucose, Bld 112 (*)    AST 40 (*)    Total Bilirubin <0.1 (*)    GFR calc non Af Amer 70 (*)    GFR calc Af Amer 81 (*)    All other components within normal limits  MAGNESIUM  PHOSPHORUS  TROPONIN I  Randolm Idol, ED    Imaging Review Dg Chest 2 View  02/08/2015   CLINICAL DATA:  74 year old male with recent pacemaker discharge last night and again this morning. Otherwise, clinically asymptomatic.  EXAM: CHEST  2 VIEW  COMPARISON:  Prior chest x-ray 12/06/2013  FINDINGS: Stable position of left subclavian approach single lead intracardiac defibrillator. The lead remains unchanged in position directed toward the right ventricular apex. Cardiac and mediastinal contours remain within normal limits. Atherosclerotic calcifications are present in the transverse aorta. Mild right basilar atelectasis versus scarring. No evidence of  pulmonary edema, focal consolidation, pleural effusion or pneumothorax. No acute osseous abnormality. Surgical clips in the right upper quadrant consistent with prior cholecystectomy. Incompletely imaged abdominal aortic stent graft.  IMPRESSION: Stable and unchanged configuration in positioning of left subclavian approach single lead intracardiac defibrillator.  No acute cardiopulmonary process.   Electronically Signed   By: Jacqulynn Cadet M.D.   On: 02/08/2015 09:26     Date: 02/08/2015  Rate: 71  Rhythm: normal sinus rhythm and premature ventricular contractions (PVC)  QRS Axis: normal  Intervals: PR prolonged  ST/T Wave abnormalities: nonspecific T wave changes  Conduction Disutrbances:none  Narrative Interpretation:   Old EKG Reviewed: unchanged    MDM   Final diagnoses:  Pacemaker complications  Chest wall pain  PVC (premature ventricular contraction)    Patient with a history of coronary artery disease status post MI in 1981 who had a cardiac arrest in 2009 prompting that he have a pacemaker defibrillator implanted. He has been feeling his normal state of health when around 11 PM last night he felt his pacemaker fire. At that time his heart was irregular and fired twice and he otherwise felt normal. This morning again at 5 AM and happen for a third time he talked with low-power cardiology and came him for further evaluation. He denies chest pain, shortness of breath, fatigue, abdominal pain, nausea, vomiting. No recent medication changes however approximate 10 days ago he finished a course of Augmentin for sinus infection. He has not missed any medication doses.  He is currently well appearing on exam with a regular heart rate in the 60s.  CBC, CMP, magnesium, phosphorus, troponin, chest x-ray, EKG pending. [employer] called for pacemaker interrogation  10:05 AM Labs without acute findings. Chest x-ray is within normal limits. EKG without acute change.  [employer] interrogator  states that they did not see any activity other than some PVCs. They are faxing the report to Dr. Caryl Comes.  10:31 AM Spoke with Dr. Pierre Bali who agrees that no further testing is warranted at this time with normal interrogation and lab work. Patient is completely asymptomatic and could be musculoskeletal in nature. He was given strict return precautions and will call Dr. Olin Pia office tomorrow for follow up in device clinic  Blanchie Dessert, MD 02/08/15 [phone]  Blanchie Dessert, MD 02/08/15 1036

## 2015-02-09 ENCOUNTER — Telehealth: Payer: Self-pay | Admitting: *Deleted

## 2015-02-09 NOTE — Telephone Encounter (Signed)
Spoke w/pt in regards to ER visit on 02-08-15.  Per SK follow up with device clinic as planned. Pt aware.

## 2015-02-10 ENCOUNTER — Observation Stay (HOSPITAL_COMMUNITY)
Admission: EM | Admit: 2015-02-10 | Discharge: 2015-02-11 | Disposition: A | Discharge status: Home / Self Care | Payer: Medicare Other | Attending: Cardiovascular Disease | Admitting: Cardiovascular Disease

## 2015-02-10 ENCOUNTER — Emergency Department (HOSPITAL_COMMUNITY): Payer: Medicare Other

## 2015-02-10 ENCOUNTER — Encounter (HOSPITAL_COMMUNITY): Payer: Self-pay | Admitting: Neurology

## 2015-02-10 DIAGNOSIS — E785 Hyperlipidemia, unspecified: Secondary | ICD-10-CM | POA: Insufficient documentation

## 2015-02-10 DIAGNOSIS — Z7982 Long term (current) use of aspirin: Secondary | ICD-10-CM | POA: Diagnosis not present

## 2015-02-10 DIAGNOSIS — Z95810 Presence of automatic (implantable) cardiac defibrillator: Secondary | ICD-10-CM

## 2015-02-10 DIAGNOSIS — I472 Ventricular tachycardia, unspecified: Secondary | ICD-10-CM

## 2015-02-10 DIAGNOSIS — I2510 Atherosclerotic heart disease of native coronary artery without angina pectoris: Secondary | ICD-10-CM | POA: Diagnosis not present

## 2015-02-10 DIAGNOSIS — T82198A Other mechanical complication of other cardiac electronic device, initial encounter: Secondary | ICD-10-CM | POA: Diagnosis not present

## 2015-02-10 DIAGNOSIS — Z4502 Encounter for adjustment and management of automatic implantable cardiac defibrillator: Secondary | ICD-10-CM

## 2015-02-10 DIAGNOSIS — Z0389 Encounter for observation for other suspected diseases and conditions ruled out: Secondary | ICD-10-CM

## 2015-02-10 DIAGNOSIS — I739 Peripheral vascular disease, unspecified: Secondary | ICD-10-CM | POA: Insufficient documentation

## 2015-02-10 DIAGNOSIS — G931 Anoxic brain damage, not elsewhere classified: Secondary | ICD-10-CM | POA: Insufficient documentation

## 2015-02-10 DIAGNOSIS — Z79899 Other long term (current) drug therapy: Secondary | ICD-10-CM | POA: Insufficient documentation

## 2015-02-10 DIAGNOSIS — Z87891 Personal history of nicotine dependence: Secondary | ICD-10-CM | POA: Diagnosis not present

## 2015-02-10 DIAGNOSIS — I493 Ventricular premature depolarization: Secondary | ICD-10-CM

## 2015-02-10 DIAGNOSIS — Z9861 Coronary angioplasty status: Secondary | ICD-10-CM | POA: Diagnosis not present

## 2015-02-10 DIAGNOSIS — I255 Ischemic cardiomyopathy: Secondary | ICD-10-CM

## 2015-02-10 DIAGNOSIS — Y838 Other surgical procedures as the cause of abnormal reaction of the patient, or of later complication, without mention of misadventure at the time of the procedure: Secondary | ICD-10-CM | POA: Diagnosis not present

## 2015-02-10 DIAGNOSIS — I252 Old myocardial infarction: Secondary | ICD-10-CM | POA: Insufficient documentation

## 2015-02-10 DIAGNOSIS — E782 Mixed hyperlipidemia: Secondary | ICD-10-CM

## 2015-02-10 DIAGNOSIS — Z8719 Personal history of other diseases of the digestive system: Secondary | ICD-10-CM | POA: Diagnosis not present

## 2015-02-10 DIAGNOSIS — J9690 Respiratory failure, unspecified, unspecified whether with hypoxia or hypercapnia: Secondary | ICD-10-CM | POA: Insufficient documentation

## 2015-02-10 DIAGNOSIS — I469 Cardiac arrest, cause unspecified: Secondary | ICD-10-CM | POA: Diagnosis not present

## 2015-02-10 LAB — BASIC METABOLIC PANEL
ANION GAP: 11 (ref 5–15)
BUN: 27 mg/dL — AB (ref 6–23)
CO2: 21 mmol/L (ref 19–32)
Calcium: 9.5 mg/dL (ref 8.4–10.5)
Chloride: 106 mmol/L (ref 96–112)
Creatinine, Ser: 0.98 mg/dL (ref 0.50–1.35)
GFR calc Af Amer: >90 mL/min (ref >90)
GFR, EST NON AFRICAN AMERICAN: 80 mL/min — AB (ref >90)
GLUCOSE: 109 mg/dL — AB (ref 70–99)
Potassium: 3.9 mmol/L (ref 3.5–5.1)
Sodium: 138 mmol/L (ref 135–145)

## 2015-02-10 LAB — CBC WITH DIFFERENTIAL/PLATELET
BASOS ABS: 0 10*3/uL (ref 0.0–0.1)
BASOS PCT: 0 % (ref 0–1)
EOS ABS: 0.1 10*3/uL (ref 0.0–0.7)
EOS PCT: 1 % (ref 0–5)
HCT: 47.2 % (ref 39.0–52.0)
Hemoglobin: 15.7 g/dL (ref 13.0–17.0)
LYMPHS PCT: 15 % (ref 12–46)
Lymphs Abs: 1.3 10*3/uL (ref 0.7–4.0)
MCH: 28.3 pg (ref 26.0–34.0)
MCHC: 33.3 g/dL (ref 30.0–36.0)
MCV: 85.2 fL (ref 78.0–100.0)
MONO ABS: 1.2 10*3/uL — AB (ref 0.1–1.0)
Monocytes Relative: 14 % — ABNORMAL HIGH (ref 3–12)
Neutro Abs: 6.4 10*3/uL (ref 1.7–7.7)
Neutrophils Relative %: 70 % (ref 43–77)
PLATELETS: 128 10*3/uL — AB (ref 150–400)
RBC: 5.54 MIL/uL (ref 4.22–5.81)
RDW: 13.1 % (ref 11.5–15.5)
WBC: 9.1 10*3/uL (ref 4.0–10.5)

## 2015-02-10 LAB — MAGNESIUM: MAGNESIUM: 2.2 mg/dL (ref 1.5–2.5)

## 2015-02-10 LAB — I-STAT TROPONIN, ED: Troponin i, poc: 0.03 ng/mL (ref 0.00–0.08)

## 2015-02-10 LAB — PHOSPHORUS: Phosphorus: 2.5 mg/dL (ref 2.3–4.6)

## 2015-02-10 MED ORDER — ATENOLOL 25 MG PO TABS
25.0000 mg | ORAL_TABLET | Freq: Two times a day (BID) | ORAL | Status: DC
  Administered 2015-02-11: 25 mg via ORAL
  Filled 2015-02-10: qty 1

## 2015-02-10 MED ORDER — LISINOPRIL 2.5 MG PO TABS
2.5000 mg | ORAL_TABLET | Freq: Every day | ORAL | Status: DC
  Administered 2015-02-11 (×2): 2.5 mg via ORAL
  Filled 2015-02-10 (×2): qty 1

## 2015-02-10 MED ORDER — ATENOLOL 25 MG PO TABS
25.0000 mg | ORAL_TABLET | Freq: Once | ORAL | Status: DC
  Filled 2015-02-10: qty 1

## 2015-02-10 MED ORDER — AMIODARONE HCL 200 MG PO TABS
200.0000 mg | ORAL_TABLET | Freq: Two times a day (BID) | ORAL | Status: DC
  Administered 2015-02-10: 200 mg via ORAL
  Filled 2015-02-10 (×2): qty 1

## 2015-02-10 MED ORDER — COLESEVELAM HCL 625 MG PO TABS
625.0000 mg | ORAL_TABLET | Freq: Two times a day (BID) | ORAL | Status: DC
  Administered 2015-02-11: 625 mg via ORAL
  Filled 2015-02-10 (×3): qty 1

## 2015-02-10 MED ORDER — ATENOLOL 25 MG PO TABS
12.5000 mg | ORAL_TABLET | Freq: Once | ORAL | Status: AC
  Administered 2015-02-10: 12.5 mg via ORAL

## 2015-02-10 MED ORDER — ROSUVASTATIN CALCIUM 10 MG PO TABS: 5.0000 mg | ORAL_TABLET | ORAL | Status: DC

## 2015-02-10 MED ORDER — ASPIRIN 81 MG PO CHEW
81.0000 mg | CHEWABLE_TABLET | Freq: Every day | ORAL | Status: DC
  Administered 2015-02-11: 81 mg via ORAL
  Filled 2015-02-10 (×2): qty 1

## 2015-02-10 MED ORDER — NITROGLYCERIN 0.2 MG/HR TD PT24
0.2000 mg | MEDICATED_PATCH | Freq: Every day | TRANSDERMAL | Status: DC
  Administered 2015-02-11: 0.2 mg via TRANSDERMAL
  Filled 2015-02-10: qty 1

## 2015-02-10 NOTE — H&P (Signed)
Cardiologist: Joel Lin, Joel Lin is an 74 y.o. male.   Chief Complaint:  AICD shock  HPI:   This pleasant 74 year old gentleman has a complex past medical history. He has a history of known ischemic heart disease. He has a history of a remote myocardial infarction at the age of 57. That was a large posterolateral myocardial infarction in 1981. The patient had angioplasty and stent of his LAD in 1999. In 2009 he had an out of hospital cardiac arrest at home was successfully resuscitated and treated with hypothermia without significant neurologic sequelae. He now has a defibrillator in place and is followed by Dr. Caryl Comes. He has a history of hypercholesterolemia. His last nuclear stress test was 10/24/11 and showed an ejection fraction of 45% and a large posterolateral scar but no ischemia. His last echocardiogram was in 2009 and showed an ejection fraction of 35-40%. On 12/06/13 the patient underwent a stent graft for his abdominal aortic aneurysm. The patient did well and was discharged without complications. The patient has his defibrillator monitored through Dr. Olin Pia office. In January 2013 he had 2 episodes of ventricular tachycardia which were asymptomatic. In March 2014 he was rehospitalized after having 3 consecutive shocks from his defibrillator. He underwent cardiac catheterization by Dr. Burt Knack on 02/28/13 who told him that his arteries have not shown any progression of atherosclerosis since his previous cardiac catheterization in 2009. He has completely retired. He has been playing golf 18 holes every other week.   This patient patient states he was playing golf and just finished 9 holes and was starting on the back 9 when he suddenly felt dizzy. The next thing he knows his device shocked him. He mainly called 911 and [place] club[place] where EMS arrived and gave him four aspirin. He reports little tenderness over his eye AICD but other than that denies The patient  currently denies nausea, vomiting, fever, shortness of breath, orthopnea,  PND, cough, congestion, abdominal pain, hematochezia, melena, lower extremity edema, claudication. He was also seen in the ER on the 28th as he felt like his device was given him low level electric shocks.    Medications: Medication Sig  aspirin 81 MG tablet Take 81 mg by mouth daily.   atenolol (TENORMIN) 25 MG tablet Take one-half tablet by  mouth two times daily  Cholecalciferol (VITAMIN D-3 PO) Take 300 mg by mouth daily.   colesevelam (WELCHOL) 625 MG tablet Take 1 tablet (625 mg total) by mouth 2 (two) times daily with a meal.  diphenhydrAMINE (BENADRYL) 50 MG capsule Take 50 mg by mouth 1 hour prior to your procedure.CAT SCAN  fish oil-omega-3 fatty acids 1000 MG capsule Take 1 g by mouth daily.   Glucosamine Sulfate-MSM (GLUCOSAMINE-MSM DS PO) Take 1 tablet by mouth daily.  ibuprofen (ADVIL,MOTRIN) 200 MG tablet Take 400 mg by mouth every 8 (eight) hours as needed for pain.  lisinopril (PRINIVIL,ZESTRIL) 2.5 MG tablet Take 1 tablet by mouth  daily  loratadine (CLARITIN) 10 MG tablet Take 10 mg by mouth daily.  Multiple Vitamin (MULTIVITAMIN WITH MINERALS) TABS Take 1 tablet by mouth daily.  nitroGLYCERIN (NITRODUR - DOSED IN MG/24 HR) 0.2 mg/hr patch Place 1 patch (0.2 mg total) onto the skin daily.  rosuvastatin (CRESTOR) 5 MG tablet 1 tablet every other day or as directed.     Past Medical History  Diagnosis Date  . Coronary artery disease     myoview 2011>>EF of 53% / large area of  old inferolateral infarct but no reversible ischemia          . Cardiac arrest 03/29/2008    at home without resuscitation anywhere from 7-10 minutes before EMS arrived/ The patient was noted to be in ventricular fibrillation  required defibrillation x3               . Hypercoagulable state     Questionable history of a borderline hypercoagulable state  . Hyperlipidemia   . Anoxic brain damage      resolved  . Respiratory  failure 2009    Respiratory failure and pulmonary collapse requiring intubation  . 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 PVCs (premature ventricular contractions)     occasionally  . Peripheral vascular disease   . AAA (abdominal aortic aneurysm)   . Contrast media allergy   . Myocardial infarction 1981  . Hemorrhoid   . Pacemaker   . Automatic implantable cardioverter-defibrillator in situ     Past Surgical History  Procedure Laterality Date  . Cholecystectomy, laparoscopic  08/13/2009    Calculous cholecystitis  . Insert / replace / remove pacemaker  04/07/2008      cardioverter defibrillator, St. Jude Single-chamber defibrillator implantation with intraoperative defibrillation threshold testing /  Durata 7121 dual-coil active defibrillator lead, serial  #[id number]   . Cardiac catheterization  04/04/2008     LAD stent patent, D1 OK, OM1 90/80/80% (59m vessel), RCA 30%; EF 35%.  . Coronary angioplasty with stent placement  1999    PTCA and stenting of his left anterior descending artery  . Cholecystectomy    . Tonsillectomy    . Abdominal aortic endovascular stent graft N/A 12/06/2013    Procedure: ABDOMINAL AORTIC ENDOVASCULAR STENT GRAFT;  Surgeon: TRosetta Posner MD;  [place] [facility]  Service: Vascular;  Laterality: N/A;  . Left heart catheterization with coronary angiogram N/A 02/28/2013    Procedure: LEFT HEART CATHETERIZATION WITH CORONARY ANGIOGRAM;  Surgeon: MSherren Mocha MD;  [facility];  Service: Cardiovascular;  Laterality: N/A;    Family History  Problem Relation Age of Onset  . Coronary artery disease      strong family history of   . Hyperlipidemia Mother   . Heart disease Father     before age 73 . Hyperlipidemia Father    Social History:  reports that he quit smoking about 35 years ago. His smoking use included Cigarettes. He has a 20 pack-year smoking history. He has never used smokeless tobacco. He reports that  he does not drink alcohol or use illicit drugs.  Allergies:  Allergies  Allergen Reactions  . Losartan Other (See Comments)    Irregular heartbeat  . Losartan Potassium Palpitations  . Metoprolol Other (See Comments)    Irregular heartbeat  . Mydriacyl [Tropicamide] Other (See Comments)    Elevated heartbeat  . Phenylephrine Hcl Other (See Comments)    Increased heartrate  . Clopidogrel Bisulfate Hives    Hives   . Epinephrine Other (See Comments)    Elevated heartbeat  . Iodine Hives    "Renografin"  . Statins Other (See Comments)    Muscle cramps     (Not in a hospital admission)  Results for orders placed or performed during the hospital encounter of 02/10/15 (from the past 48 hour(s))  CBC with Differential     Status: Abnormal   Collection Time: 02/10/15  3:21 PM  Result Value Ref Range   WBC 9.1 4.0 - 10.5  K/uL   RBC 5.54 4.22 - 5.81 MIL/uL   Hemoglobin 15.7 13.0 - 17.0 g/dL   HCT 47.2 39.0 - 52.0 %   MCV 85.2 78.0 - 100.0 fL   MCH 28.3 26.0 - 34.0 pg   MCHC 33.3 30.0 - 36.0 g/dL   RDW 13.1 11.5 - 15.5 %   Platelets 128 (L) 150 - 400 K/uL   Neutrophils Relative % 70 43 - 77 %   Neutro Abs 6.4 1.7 - 7.7 K/uL   Lymphocytes Relative 15 12 - 46 %   Lymphs Abs 1.3 0.7 - 4.0 K/uL   Monocytes Relative 14 (H) 3 - 12 %   Monocytes Absolute 1.2 (H) 0.1 - 1.0 K/uL   Eosinophils Relative 1 0 - 5 %   Eosinophils Absolute 0.1 0.0 - 0.7 K/uL   Basophils Relative 0 0 - 1 %   Basophils Absolute 0.0 0.0 - 0.1 K/uL  Magnesium     Status: None   Collection Time: 02/10/15  3:21 PM  Result Value Ref Range   Magnesium 2.2 1.5 - 2.5 mg/dL  Basic metabolic panel     Status: Abnormal   Collection Time: 02/10/15  3:21 PM  Result Value Ref Range   Sodium 138 135 - 145 mmol/L   Potassium 3.9 3.5 - 5.1 mmol/L   Chloride 106 96 - 112 mmol/L   CO2 21 19 - 32 mmol/L   Glucose, Bld 109 (H) 70 - 99 mg/dL   BUN 27 (H) 6 - 23 mg/dL   Creatinine, Ser 0.98 0.50 - 1.35 mg/dL   Calcium  9.5 8.4 - 10.5 mg/dL   GFR calc non Af Amer 80 (L) >90 mL/min   GFR calc Af Amer >90 >90 mL/min    Comment: (NOTE) The eGFR has been calculated using the CKD EPI equation. This calculation has not been validated in all clinical situations. eGFR's persistently <90 mL/min signify possible Chronic Kidney Disease.    Anion gap 11 5 - 15  Phosphorus     Status: None   Collection Time: 02/10/15  3:21 PM  Result Value Ref Range   Phosphorus 2.5 2.3 - 4.6 mg/dL  I-Stat Troponin, ED (not at [facility])     Status: None   Collection Time: 02/10/15  4:51 PM  Result Value Ref Range   Troponin i, poc 0.03 0.00 - 0.08 ng/mL   Comment 3            Comment: Due to the release kinetics of cTnI, a negative result within the first hours of the onset of symptoms does not rule out myocardial infarction with certainty. If myocardial infarction is still suspected, repeat the test at appropriate intervals.    Dg Chest Portable 1 View  02/10/2015   CLINICAL DATA:  Defibrillator malfunction.  EXAM: PORTABLE CHEST - 1 VIEW  COMPARISON:  February 08, 2015.  FINDINGS: Stable cardiomediastinal silhouette. Stable position of single lead left-sided defibrillator. No pneumothorax or pleural effusion is noted. No acute pulmonary disease is noted. Bony thorax is intact.  IMPRESSION: No acute cardiopulmonary abnormality seen.   Electronically Signed   By: Marijo Conception, M.D.   On: 02/10/2015 16:21    Review of Systems  All other systems reviewed and are negative. +dizziness, +chest soreness  The patient currently denies nausea, vomiting, fever, shortness of breath, orthopnea, PND, cough, congestion, abdominal pain, hematochezia, melena, lower extremity edema, claudication.   Blood pressure 118/80, pulse 73, temperature 98.4 F (36.9 C), temperature source  Oral, resp. rate 18, SpO2 95 %. Physical Exam  Nursing note and vitals reviewed. Constitutional: He is oriented to person, place, and time. He appears  well-developed and well-nourished. No distress.  HENT:  Head: Normocephalic and atraumatic.  Mouth/Throat: No oropharyngeal exudate.  Eyes: EOM are normal. Pupils are equal, round, and reactive to light. No scleral icterus.  Neck: Normal range of motion. Neck supple. No JVD present.  Cardiovascular: Normal rate, regular rhythm, S1 normal and S2 normal.   No murmur heard. Pulses:      Radial pulses are 2+ on the right side, and 2+ on the left side.       Dorsalis pedis pulses are 2+ on the right side, and 2+ on the left side.  Respiratory: Effort normal and breath sounds normal. He has no wheezes. He has no rales.  GI: Soft. Bowel sounds are normal. He exhibits no distension. There is no tenderness.  Musculoskeletal: He exhibits no edema.  Neurological: He is alert and oriented to person, place, and time. He exhibits normal muscle tone.  Skin: Skin is warm and dry.  Psychiatric: He has a normal mood and affect.     Assessment/Plan    Ventricular tachycardia   HYPERLIPIDEMIA, MIXED   Coronary atherosclerosis   PVC's (premature ventricular contractions)   Single implantable cardioverter-defibrillator [employer] Judes   Ischemic cardiomyopathy  He appears to have been appropriately shocked for ventricular tachycardia. He will be admitted for observation and monitor on telemetry. We'll increase atenolol to 25 mg twice daily. He was given at a dose in the ER.  Magnesium and potassium are within normal limits.  Is not complaining of any angina or heart failure symptoms. Chest x-ray is without acute pulmonary pulmonary disease. Initial troponin is 0.03.  May need EP consult tomorrow.  Further plan per M.D.  Tarri Fuller, PA-C 02/10/2015, 7:47 PM   The patient was seen and examined, and I agree with the assessment and plan as documented above, with modifications as noted below. Very pleasant 74 yr old male with aforementioned cardiovascular history (CAD, PCI, cardiac arrest s/p ICD, AAA s/p stent  graft) who began experiencing the acute onset of dizziness while playing golf at the 10th hole when his ICD subsequently fired. Interrogation demonstrates it appropriately fired for ventricular tachycardia. EF 40% by echocardiogram in 12/2012. Had been on higher doses of atenolol (75 mg bid) in the past but made him bradycardic and "sluggish". Denies antecedent chest pain and shortness of breath. Has noticed frequent PVC's this past Sunday and "electrical activity/small shocks" last weekend. No marked abnormalities on physical exam (no murmurs or gallops, no rales) and K, Mg, Phos all normal. Mildly thrombocytopenic. No evidence for infection. ECG shows sinus rhythm with PVC's.  Will plan to increase atenolol to 25 mg bid and also start amiodarone 200 mg bid as it appears his prior myocardial scar is the substrate for today's event. I do not feel an ischemic evaluation is necessarily warranted at this time given prior cath results noted above with no disease progression from 2009-2014. Consider EP evaluation on 3/2. He is followed by Dr. Caryl Comes.

## 2015-02-10 NOTE — ED Provider Notes (Signed)
CSN: [id number]     Arrival date & time 02/10/15  1502 History   First MD Initiated Contact with Patient 02/10/15 1515     Chief Complaint  Patient presents with  . AICD Problem   (Consider location/radiation/quality/duration/timing/severity/associated sxs/prior Treatment) HPI Comments: 74 yo M hx of CAD, prior hx of cardiac arrest 2/2 V fib s/p AICD, AAA, presents with CC of AICD fire.  Pt was in ED on 2/28 after feeling two distinct twinges around his AICD site, and thought it may have fired.  Pt had labs which were normal at the time, and his device interrogated which did not record any abnormal events.  Cardiology was consulted, and pt was able to be d/c with cardiology f/u.  Pt states Sunday and Monday he had PVCs at home, and some left sided CP around his AICD site.  Today pt states he was golfing, standing in [place] when he suddenly felt lightheaded.  He states he sat down in the golf cart, and then felt a large shock from his AICD.  EMS was called and pt brought in for further evaluation.  Currently pt states he feels okay, and c/o only mild left chest discomfort around his device site.  He denies fever, chills, diaphoresis, SOB, cough, abdominal pain, nausea, vomiting, diarrhea, rash, myalgias, or any other symptoms.  He denies any recent medication changes.  No other concerns.    The history is provided by the patient. No language interpreter was used.    Past Medical History  Diagnosis Date  . Coronary artery disease     myoview 2011>>EF of 53% / large area of old inferolateral infarct but no reversible ischemia          . Cardiac arrest 03/29/2008    at home without resuscitation anywhere from 7-10 minutes before EMS arrived/ The patient was noted to be in ventricular fibrillation  required defibrillation x3               . Hypercoagulable state     Questionable history of a borderline hypercoagulable state  . Hyperlipidemia   . Anoxic brain damage      resolved  . Respiratory  failure 2009    Respiratory failure and pulmonary collapse requiring intubation  . 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 PVCs (premature ventricular contractions)     occasionally  . Peripheral vascular disease   . AAA (abdominal aortic aneurysm)   . Contrast media allergy   . Myocardial infarction 1981  . Hemorrhoid   . Pacemaker   . Automatic implantable cardioverter-defibrillator in situ    Past Surgical History  Procedure Laterality Date  . Cholecystectomy, laparoscopic  08/13/2009    Calculous cholecystitis  . Insert / replace / remove pacemaker  04/07/2008      cardioverter defibrillator, St. Jude Single-chamber defibrillator implantation with intraoperative defibrillation threshold testing /  Durata 7121 dual-coil active defibrillator lead, serial  #[id number]   . Cardiac catheterization  04/04/2008     LAD stent patent, D1 OK, OM1 90/80/80% (70mm vessel), RCA 30%; EF 35%.  . Coronary angioplasty with stent placement  1999    PTCA and stenting of his left anterior descending artery  . Cholecystectomy    . Tonsillectomy    . Abdominal aortic endovascular stent graft N/A 12/06/2013    Procedure: ABDOMINAL AORTIC ENDOVASCULAR STENT GRAFT;  Surgeon: Rosetta Posner, MD;  Location: [place];  Service: Vascular;  Laterality: N/A;  . Left heart catheterization  with coronary angiogram N/A 02/28/2013    Procedure: LEFT HEART CATHETERIZATION WITH CORONARY ANGIOGRAM;  Surgeon: Sherren Mocha, MD;  Location: [facility];  Service: Cardiovascular;  Laterality: N/A;   Family History  Problem Relation Age of Onset  . Coronary artery disease      strong family history of   . Hyperlipidemia Mother   . Heart disease Father     before age 59  . Hyperlipidemia Father    History  Substance Use Topics  . Smoking status: Former Smoker -- 1.00 packs/day for 20 years    Types: Cigarettes    Quit date: 12/23/1979  . Smokeless tobacco: Never Used  . Alcohol Use: No     Review of Systems  Constitutional: Negative for fever and chills.  Respiratory: Negative for cough and shortness of breath.   Cardiovascular: Negative for chest pain, palpitations and leg swelling.  Gastrointestinal: Negative for nausea, vomiting, abdominal pain and diarrhea.  Musculoskeletal: Negative for myalgias.  Skin: Negative for rash.  Neurological: Negative for dizziness, weakness, light-headedness, numbness and headaches.  Hematological: Negative for adenopathy. Does not bruise/bleed easily.  All other systems reviewed and are negative.     Allergies  Losartan; Losartan potassium; Metoprolol; Mydriacyl; Phenylephrine hcl; Clopidogrel bisulfate; Epinephrine; Iodine; and Statins  Home Medications   Prior to Admission medications   Medication Sig Start Date End Date Taking? Authorizing Provider  aspirin 81 MG tablet Take 81 mg by mouth daily.     Historical Provider, MD  atenolol (TENORMIN) 25 MG tablet Take one-half tablet by  mouth two times daily 01/21/15   Darlin Coco, MD  Cholecalciferol (VITAMIN D-3 PO) Take 300 mg by mouth daily.     Historical Provider, MD  colesevelam (WELCHOL) 625 MG tablet Take 1 tablet (625 mg total) by mouth 2 (two) times daily with a meal. 04/09/14   Darlin Coco, MD  diphenhydrAMINE (BENADRYL) 50 MG capsule Take 50 mg by mouth 1 hour prior to your procedure.CAT SCAN 12/10/13   Rosetta Posner, MD  fish oil-omega-3 fatty acids 1000 MG capsule Take 1 g by mouth daily.     Historical Provider, MD  Glucosamine Sulfate-MSM (GLUCOSAMINE-MSM DS PO) Take 1 tablet by mouth daily.    Historical Provider, MD  ibuprofen (ADVIL,MOTRIN) 200 MG tablet Take 400 mg by mouth every 8 (eight) hours as needed for pain.    Historical Provider, MD  lisinopril (PRINIVIL,ZESTRIL) 2.5 MG tablet Take 1 tablet by mouth  daily 01/21/15   Darlin Coco, MD  loratadine (CLARITIN) 10 MG tablet Take 10 mg by mouth daily.    Historical Provider, MD  Multiple Vitamin  (MULTIVITAMIN WITH MINERALS) TABS Take 1 tablet by mouth daily.    Historical Provider, MD  nitroGLYCERIN (NITRODUR - DOSED IN MG/24 HR) 0.2 mg/hr patch Place 1 patch (0.2 mg total) onto the skin daily. 03/31/14   Darlin Coco, MD  rosuvastatin (CRESTOR) 5 MG tablet 1 tablet every other day or as directed. 02/10/14   Darlin Coco, MD   BP 155/79 mmHg  Pulse 91  Temp(Src) 98.4 F (36.9 C) (Oral)  Resp 16  SpO2 94% Physical Exam  Constitutional: He is oriented to person, place, and time. He appears well-developed and well-nourished.  HENT:  Head: Normocephalic and atraumatic.  Right Ear: External ear normal.  Left Ear: External ear normal.  Mouth/Throat: Oropharynx is clear and moist.  Eyes: Conjunctivae and EOM are normal. Pupils are equal, round, and reactive to light.  Neck: Normal range of  motion. Neck supple.  Cardiovascular: Normal rate, regular rhythm, normal heart sounds and intact distal pulses.   Pulmonary/Chest: Effort normal and breath sounds normal. No respiratory distress. He has no wheezes. He has no rales. He exhibits no tenderness.  Abdominal: Soft. Bowel sounds are normal. He exhibits no distension and no mass. There is no tenderness. There is no rebound and no guarding.  Musculoskeletal: Normal range of motion.  Neurological: He is alert and oriented to person, place, and time.  Skin: Skin is warm.  Nursing note and vitals reviewed.   ED Course  Procedures (including critical care time) Labs Review Labs Reviewed  CBC WITH DIFFERENTIAL/PLATELET - Abnormal; Notable for the following:    Platelets 128 (*)    Monocytes Relative 14 (*)    Monocytes Absolute 1.2 (*)    All other components within normal limits  BASIC METABOLIC PANEL - Abnormal; Notable for the following:    Glucose, Bld 109 (*)    BUN 27 (*)    GFR calc non Af Amer 80 (*)    All other components within normal limits  MAGNESIUM  PHOSPHORUS  BASIC METABOLIC PANEL  I-STAT TROPOININ, ED     Imaging Review Dg Chest Portable 1 View  02/10/2015   CLINICAL DATA:  Defibrillator malfunction.  EXAM: PORTABLE CHEST - 1 VIEW  COMPARISON:  February 08, 2015.  FINDINGS: Stable cardiomediastinal silhouette. Stable position of single lead left-sided defibrillator. No pneumothorax or pleural effusion is noted. No acute pulmonary disease is noted. Bony thorax is intact.  IMPRESSION: No acute cardiopulmonary abnormality seen.   Electronically Signed   By: Marijo Conception, M.D.   On: 02/10/2015 16:21     EKG Interpretation   Date/Time:  Tuesday February 10 2015 15:03:54 EST Ventricular Rate:  93 PR Interval:  252 QRS Duration: 105 QT Interval:  348 QTC Calculation: 433 R Axis:   -101 Text Interpretation:  Sinus rhythm Multiple ventricular premature  complexes Prolonged PR interval Non-specific intra-ventricular conduction  delay No significant change since last tracing Confirmed by Ashok Cordia  MD,  Lennette Bihari ([id number]) on 02/10/2015 3:14:53 PM      MDM   Final diagnoses:  Ventricular tachycardia  AICD discharge   74 yo M hx of CAD, prior hx of cardiac arrest 2/2 V fib s/p AICD, AAA, presents with CC of AICD fire.   Physical exam as above.  VS WNL.  EKG with PVC, but no other acute changes from prior.  Pt with frequent PVC's on monitor every 4-5 beats.  Pt currently asymptomatic.   CBC, BMP, Mag, Phos, and Troponin all WNL.  CXR unremarkable.    Device was interrogated, and pt had VT around 2 PM, paced initially, and then defibrillated back into sinus rhythm.  Cardiology consulted.  Pt given atenolol, and admitted to hospital for further evaluation and management.  Pt understands and agrees with plan.   Sinda Du  Discussed pt with attending Dr. Ashok Cordia.    Sinda Du, MD 02/11/15 [id number]  Mirna Mires, MD 02/11/15 [phone]

## 2015-02-10 NOTE — ED Notes (Signed)
MD at bedside. 

## 2015-02-10 NOTE — ED Notes (Addendum)
CN made aware need for ST. Jude's device interrogation.

## 2015-02-10 NOTE — ED Notes (Signed)
[employer], approximately 2pm pt went into VT rate of 235, attempted to pace and was unsuccessful, shock x1 was delivered.  MD made aware.

## 2015-02-10 NOTE — ED Notes (Signed)
Per EMS- Pt was playing golf, felt dizzy then his defib fired x 1. ST EKG with PVCs. Denies cp or sob. Has st. Jude's defib/pacemaker. Was just checked on Sunday and told device was working well/ BP 156/98, HR 104, 18 RR, 96% RA, CBG 165. Pt is a x 4.

## 2015-02-11 LAB — BASIC METABOLIC PANEL
Anion gap: 5 (ref 5–15)
BUN: 25 mg/dL — AB (ref 6–23)
CO2: 29 mmol/L (ref 19–32)
Calcium: 8.9 mg/dL (ref 8.4–10.5)
Chloride: 104 mmol/L (ref 96–112)
Creatinine, Ser: 1.07 mg/dL (ref 0.50–1.35)
GFR calc Af Amer: 78 mL/min — ABNORMAL LOW (ref >90)
GFR, EST NON AFRICAN AMERICAN: 67 mL/min — AB (ref >90)
GLUCOSE: 105 mg/dL — AB (ref 70–99)
Potassium: 4.1 mmol/L (ref 3.5–5.1)
SODIUM: 138 mmol/L (ref 135–145)

## 2015-02-11 MED ORDER — BISOPROLOL FUMARATE 5 MG PO TABS: 2.5000 mg | ORAL_TABLET | Freq: Two times a day (BID) | ORAL | Status: DC

## 2015-02-11 MED ORDER — MAGNESIUM OXIDE 400 (241.3 MG) MG PO TABS: 400.0000 mg | ORAL_TABLET | Freq: Every day | ORAL | Status: DC

## 2015-02-11 MED ORDER — MAGNESIUM OXIDE 400 (241.3 MG) MG PO TABS
400.0000 mg | ORAL_TABLET | Freq: Every day | ORAL | Status: DC
  Administered 2015-02-11: 400 mg via ORAL
  Filled 2015-02-11: qty 1

## 2015-02-11 NOTE — Progress Notes (Signed)
Patient Name: Joel Lin      SUBJECTIVE: without complaints  Some increased ectopy of late  Past Medical History  Diagnosis Date  . Coronary artery disease     myoview 2011>>EF of 53% / large area of old inferolateral infarct but no reversible ischemia          . Cardiac arrest 03/29/2008    at home without resuscitation anywhere from 7-10 minutes before EMS arrived/ The patient was noted to be in ventricular fibrillation  required defibrillation x3               . Hypercoagulable state     Questionable history of a borderline hypercoagulable state  . Hyperlipidemia   . Anoxic brain damage      resolved  . Respiratory failure 2009    Respiratory failure and pulmonary collapse requiring intubation  . 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 PVCs (premature ventricular contractions)     occasionally  . Peripheral vascular disease   . AAA (abdominal aortic aneurysm)   . Contrast media allergy   . Myocardial infarction 1981  . Hemorrhoid   . Pacemaker   . Automatic implantable cardioverter-defibrillator in situ     Scheduled Meds:  Scheduled Meds: . amiodarone  200 mg Oral BID  . aspirin  81 mg Oral Daily  . atenolol  25 mg Oral BID  . colesevelam  625 mg Oral BID WC  . lisinopril  2.5 mg Oral Daily  . nitroGLYCERIN  0.2 mg Transdermal Daily  . rosuvastatin  5 mg Oral QODAY   Continuous Infusions:      PHYSICAL EXAM Filed Vitals:   02/10/15 2230 02/10/15 2300 02/10/15 2340 02/11/15 0500  BP: 108/65 111/60 139/72 107/64  Pulse: 57 58 63 55  Temp:   98.5 F (36.9 C) 98.6 F (37 C)  TempSrc:   Oral Oral  Resp: 14 12 16 16   Height:   5\' 9"  (1.753 m)   Weight:   181 lb 14.4 oz (82.509 kg) 181 lb 4.8 oz (82.237 kg)  SpO2: 93% 95% 95% 95%    Well developed and nourished in no acute distress HENT normal Neck supple with JVP-flat Clear Regular rate and rhythm, no murmurs or gallops Abd-soft with active BS No  Clubbing cyanosis edema Skin-warm and dry A & Oriented  Grossly normal sensory and motor function   TELEMETRY: Reviewed telemetry pt in some ventricular ectopy:    Intake/Output Summary (Last 24 hours) at 02/11/15 1039 Last data filed at 02/10/15 1808  Gross per 24 hour  Intake      0 ml  Output    600 ml  Net   -600 ml    LABS: Basic Metabolic Panel:  Recent Labs Lab 02/08/15 0900 02/10/15 1521 02/11/15 0335  NA 140 138 138  K 4.3 3.9 4.1  CL 105 106 104  CO2 30 21 29   GLUCOSE 112* 109* 105*  BUN 20 27* 25*  CREATININE 1.04 0.98 1.07  CALCIUM 9.4 9.5 8.9  MG 2.1 2.2  --   PHOS 3.0 2.5  --    Cardiac Enzymes: No results for input(s): CKTOTAL, CKMB, CKMBINDEX, TROPONINI in the last 72 hours. CBC:  Recent Labs Lab 02/08/15 0900 02/10/15 1521  WBC 6.4 9.1  NEUTROABS 4.5 6.4  HGB 15.1 15.7  HCT 45.8 47.2  MCV 84.5 85.2  PLT 135* 128*   Enzymes neg    Device Interrogation:  VT with failed ATP at 250 and tehn successful shock   ASSESSMENT AND PLAN:  Principal Problem:   Ventricular tachycardia Active Problems:   HYPERLIPIDEMIA, MIXED   Coronary atherosclerosis   PVC's (premature ventricular contractions)   Single implantable cardioverter-defibrillator [employer]   Ischemic cardiomyopathy  VT and 2 years ago,.  Will hold on initiating amiodarone given relative infrequency Will change betablockers and avoid lipophilic 2/2 behavioural changes Add MgSO4  Signed, Virl Axe MD  02/11/2015

## 2015-02-11 NOTE — Discharge Summary (Signed)
Physician Discharge Summary       Patient ID: Joel Lin MRN: [id number] DOB/AGE: 1941-08-01 74 y.o.  Admit date: 02/10/2015 Discharge date: 02/11/2015 Primary Cardiologist:Dr. Mare Ferrari EP: Dr. Caryl Comes   Discharge Diagnoses:  Principal Problem:   Ventricular tachycardia, s/p ICD shock Active Problems:   HYPERLIPIDEMIA, MIXED   Coronary atherosclerosis   PVC's (premature ventricular contractions)   Single implantable cardioverter-defibrillator [employer]   Ischemic cardiomyopathy   Discharged Condition: good  Procedures: none   Hospital Course: 74 year old gentleman with a complex past medical history of known ischemic heart disease. He has a history of a remote myocardial infarction at the age of 66. That was a large posterolateral myocardial infarction in 1981. The patient had angioplasty and stent of his LAD in 1999. In 2009 he had an out of hospital cardiac arrest at home was successfully resuscitated and treated with hypothermia without significant neurologic sequelae. He now has a defibrillator in place and is followed by Dr. Caryl Comes. He has a history of hypercholesterolemia. His last nuclear stress test was 10/24/11 and showed an ejection fraction of 45% and a large posterolateral scar but no ischemia. His last echocardiogram was in 2009 and showed an ejection fraction of 35-40%. On 12/06/13 the patient underwent a stent graft for his abdominal aortic aneurysm. The patient did well and was discharged without complications. The patient has his defibrillator monitored through Dr. Olin Pia office. In January 2013 he had 2 episodes of ventricular tachycardia which were asymptomatic. In March 2014 he was rehospitalized after having 3 consecutive shocks from his defibrillator. He underwent cardiac catheterization by Dr. Burt Knack on 02/28/13 who told him that his arteries have not shown any progression of atherosclerosis since his previous cardiac catheterization in 2009. He has completely  retired. He has been playing golf 18 holes every other week.  This patient patient presented to [facility] ED due to ICD shock.  He stated he was playing golf and just finished 9 holes and was starting on the back 9 when he suddenly felt dizzy. The next thing he knows his device shocked him. He called 911 and went back to the clubhouse where EMS arrived and gave him four aspirin. He reports little tenderness over his eye AICD but other than that denies The patient currently denies nausea, vomiting, fever, shortness of breath, orthopnea, PND, cough, congestion, abdominal pain, hematochezia, melena, lower extremity edema, claudication.  He was also seen in the ER on the 28th as he felt like his device was given him low level electric shocks.   Pt was transferred to [facility] for EP eval.  He did well overnight without any ICD shocks.  Troponin was negative.  He was seen by Dr. Caryl Comes and found stable for discharge.  He had been placed on po amiodarone but Dr. Caryl Comes held on initiating amiodarone given relative infrequency of V tach.  He changed betablocker to bisoprolol and avoid lipophilic 2/2 behavioural changes, Add MgSO4.  Consults: cardiology  Significant Diagnostic Studies:  BMET    Component Value Date/Time   NA 138 02/11/2015 0335   K 4.1 02/11/2015 0335   CL 104 02/11/2015 0335   CO2 29 02/11/2015 0335   GLUCOSE 105* 02/11/2015 0335   BUN 25* 02/11/2015 0335   CREATININE 1.07 02/11/2015 0335   CREATININE 0.82 11/06/2013 1035   CALCIUM 8.9 02/11/2015 0335   GFRNONAA 67* 02/11/2015 0335   GFRAA 78* 02/11/2015 0335    CBC    Component Value Date/Time   WBC 9.1  02/10/2015 1521   RBC 5.54 02/10/2015 1521   HGB 15.7 02/10/2015 1521   HCT 47.2 02/10/2015 1521   PLT 128* 02/10/2015 1521   MCV 85.2 02/10/2015 1521   MCH 28.3 02/10/2015 1521   MCHC 33.3 02/10/2015 1521   RDW 13.1 02/10/2015 1521   LYMPHSABS 1.3 02/10/2015 1521   MONOABS 1.2* 02/10/2015 1521   EOSABS 0.1  02/10/2015 1521   BASOSABS 0.0 02/10/2015 1521    Troponin <0.03  ;  0.03   PORTABLE CHEST - 1 VIEW COMPARISON: February 08, 2015. FINDINGS: Stable cardiomediastinal silhouette. Stable position of single lead left-sided defibrillator. No pneumothorax or pleural effusion is noted. No acute pulmonary disease is noted. Bony thorax is intact. IMPRESSION: No acute cardiopulmonary abnormality seen.  Discharge Exam: Blood pressure 127/68, pulse 55, temperature 98.6 F (37 C), temperature source Oral, resp. rate 16, height 5\' 9"  (1.753 m), weight 181 lb 4.8 oz (82.237 kg), SpO2 95 %.   Disposition: 01-Home or Self Care     Medication List    STOP taking these medications        atenolol 25 MG tablet  Commonly known as:  TENORMIN      TAKE these medications        aspirin 81 MG tablet  Take 81 mg by mouth daily.     bisoprolol 5 MG tablet  Commonly known as:  ZEBETA  Take 0.5 tablets (2.5 mg total) by mouth 2 (two) times daily.     colesevelam 625 MG tablet  Commonly known as:  WELCHOL  Take 1 tablet (625 mg total) by mouth 2 (two) times daily with a meal.     diphenhydrAMINE 50 MG capsule  Commonly known as:  BENADRYL  Take 50 mg by mouth 1 hour prior to your procedure.CAT SCAN     fish oil-omega-3 fatty acids 1000 MG capsule  Take 1 g by mouth daily.     GLUCOSAMINE-MSM DS PO  Take 1 tablet by mouth daily.     ibuprofen 200 MG tablet  Commonly known as:  ADVIL,MOTRIN  Take 400 mg by mouth every 8 (eight) hours as needed for pain.     lisinopril 2.5 MG tablet  Commonly known as:  PRINIVIL,ZESTRIL  Take 1 tablet by mouth  daily     loratadine 10 MG tablet  Commonly known as:  CLARITIN  Take 10 mg by mouth daily.     magnesium oxide 400 (241.3 MG) MG tablet  Commonly known as:  MAG-OX  Take 1 tablet (400 mg total) by mouth daily.     multivitamin with minerals Tabs tablet  Take 1 tablet by mouth daily.     nitroGLYCERIN 0.2 mg/hr patch  Commonly  known as:  NITRODUR - Dosed in mg/24 hr  Place 1 patch (0.2 mg total) onto the skin daily.     rosuvastatin 5 MG tablet  Commonly known as:  CRESTOR  1 tablet every other day or as directed.     VITAMIN D-3 PO  Take 300 mg by mouth daily.       Follow-up Information    Follow up with Virl Axe, MD.   Specialty:  Cardiology   Why:  the office will call with date and time.    Contact information:   [address] [address] [phone]        Discharge Instructions: Heart Healthy diet.  Call if any problems  Follow up as instructed.  Signed: [id number],[id number]  R Nurse Practitioner-Certified [facility]: [facility] 02/11/2015, 2:22 PM  Time spent on discharge :> 30 minutes.

## 2015-02-11 NOTE — Discharge Instructions (Signed)
Heart Healthy diet.  Call if any problems  Follow up as instructed.

## 2015-02-11 NOTE — Progress Notes (Signed)
UR completed 

## 2015-02-13 ENCOUNTER — Telehealth: Payer: Self-pay | Admitting: Cardiology

## 2015-02-13 NOTE — Telephone Encounter (Signed)
New Message     Pt c/o medication issue:  1. Name of Medication: Zebepa  2. How are you currently taking this medication (dosage and times per day)?12 and 1/2 mg twice a day   3. Are you having a reaction (difficulty breathing--STAT)? No   4. What is your medication issue? Beta blocker

## 2015-02-13 NOTE — Telephone Encounter (Signed)
Patient st his medications were changed at recent hospital stay. They stopped atenolol and started zebeta 12.5 BID. He also started magnesium.  Patient st 30-60 min after taking the zebeta he feels "fuzzy" and a little dizzy for a couple hours.  HR now 50-52 and was usually 60s before medications were changed. Pt does not report any other symptoms. Patient requests callback at [phone] for instruction.  To Dr. Mare Ferrari and Dr. Caryl Comes for review and recommendations.

## 2015-02-13 NOTE — Telephone Encounter (Signed)
I will defer to Dr. Caryl Comes who saw him recently in the hospital and initiated change in meds.

## 2015-02-18 NOTE — Telephone Encounter (Signed)
Late Entry: Reviewed with Dr. Caryl Comes yesterday after clinic.  Dr. Caryl Comes states he spoke with the patient about this.

## 2015-02-23 ENCOUNTER — Other Ambulatory Visit (INDEPENDENT_AMBULATORY_CARE_PROVIDER_SITE_OTHER): Payer: Medicare Other | Admitting: *Deleted

## 2015-02-23 DIAGNOSIS — Z4502 Encounter for adjustment and management of automatic implantable cardiac defibrillator: Secondary | ICD-10-CM

## 2015-02-23 DIAGNOSIS — E782 Mixed hyperlipidemia: Secondary | ICD-10-CM

## 2015-02-23 LAB — BASIC METABOLIC PANEL
BUN: 21 mg/dL (ref 6–23)
CO2: 31 meq/L (ref 19–32)
CREATININE: 1.03 mg/dL (ref 0.40–1.50)
Calcium: 9.3 mg/dL (ref 8.4–10.5)
Chloride: 104 mEq/L (ref 96–112)
GFR: 75.31 mL/min (ref >60.00)
Glucose, Bld: 90 mg/dL (ref 70–99)
Potassium: 3.8 mEq/L (ref 3.5–5.1)
Sodium: 137 mEq/L (ref 135–145)

## 2015-02-23 LAB — HEPATIC FUNCTION PANEL
ALK PHOS: 61 U/L (ref 39–117)
ALT: 30 U/L (ref 0–53)
AST: 29 U/L (ref 0–37)
Albumin: 4.1 g/dL (ref 3.5–5.2)
Bilirubin, Direct: 0.1 mg/dL (ref 0.0–0.3)
Total Bilirubin: 0.4 mg/dL (ref 0.2–1.2)
Total Protein: 6.9 g/dL (ref 6.0–8.3)

## 2015-02-23 LAB — LIPID PANEL
CHOL/HDL RATIO: 3
Cholesterol: 152 mg/dL (ref 0–200)
HDL: 44.4 mg/dL (ref >39.00)
LDL Cholesterol: 84 mg/dL (ref 0–99)
NONHDL: 107.6
Triglycerides: 117 mg/dL (ref 0.0–149.0)
VLDL: 23.4 mg/dL (ref 0.0–40.0)

## 2015-02-23 NOTE — Addendum Note (Signed)
Addended by: Eulis Foster on: 02/23/2015 08:05 AM   Modules accepted: Orders

## 2015-02-24 NOTE — Progress Notes (Signed)
Quick Note:  Please make copy of labs for patient visit. ______ 

## 2015-03-04 ENCOUNTER — Ambulatory Visit (INDEPENDENT_AMBULATORY_CARE_PROVIDER_SITE_OTHER): Payer: Medicare Other | Admitting: Cardiology

## 2015-03-04 ENCOUNTER — Encounter: Payer: Self-pay | Admitting: Cardiology

## 2015-03-04 VITALS — BP 146/72 | HR 64 | Ht 69.0 in | Wt 184.4 lb

## 2015-03-04 DIAGNOSIS — I255 Ischemic cardiomyopathy: Secondary | ICD-10-CM

## 2015-03-04 DIAGNOSIS — E782 Mixed hyperlipidemia: Secondary | ICD-10-CM | POA: Diagnosis not present

## 2015-03-04 NOTE — Patient Instructions (Signed)
STOP BISOPROLOL   RESTART ATENOLOL 25 MG 1/2 TWICE A DAY  CONTINUE MAGNESIUM 400 MG DAILY  Your physician recommends that you schedule a follow-up appointment in: 2 MONTH OV EKG, MAGNESIUM, BMET

## 2015-03-04 NOTE — Progress Notes (Signed)
Cardiology Office Note   Date:  03/04/2015   ID:  Joel Lin, DOB 17-Apr-1941, MRN [id number]  PCP:  Kandice Hams, MD  Cardiologist:   Darlin Coco, MD   No chief complaint on file.     History of Present Illness: Joel Lin is a 74 y.o. male who presents for follow-up office visit  This pleasant 74 year old gentleman is seen for a followup office visit. He has a complex past medical history. He has a history of known ischemic heart disease. He has a history of a remote myocardial infarction at the age of 50. That was a large posterolateral myocardial infarction in 1981. The patient had angioplasty and stent of his LAD in 1999. In 2009 he had an out of hospital cardiac arrest at home was successfully resuscitated and treated with hypothermia without significant neurologic sequelae. He now has a defibrillator in place and is followed by Dr. Caryl Comes. He has a history of hypercholesterolemia. His last nuclear stress test was 10/24/11 and showed an ejection fraction of 45% and a large posterolateral scar but no ischemia. His last echocardiogram was in 2009 and showed an ejection fraction of 35-40%. On 12/06/13 the patient underwent a stent graft for his abdominal aortic aneurysm. The patient did well and was discharged without complications. The patient has his defibrillator monitored through Dr. Olin Pia office. In January 2013 he had 2 episodes of ventricular tachycardia which were asymptomatic. In March 2014 he was rehospitalized after having 3 consecutive shocks from his defibrillator. He underwent cardiac catheterization by Dr. Burt Knack on 02/28/13 who told him that his arteries have not shown any progression of atherosclerosis since his previous cardiac catheterization in 2009.  since we last saw him he has completely retired. He has been playing golf 18 holes every other week. He is not having any difficulty with shortness of breath or chest pain. This patient recently  presented to [facility] ED due to ICD shock. He stated he was playing golf and just finished 9 holes and was starting on the back 9 when he suddenly felt dizzy. The next thing he knows his device shocked him. He called 911 and went back to the clubhouse where EMS arrived and gave him four aspirin.  Pt was transferred to [facility] for EP eval. He did well overnight without any ICD shocks. Troponin was negative. He was seen by Dr. Caryl Comes and found stable for discharge. He had been placed on po amiodarone but Dr. Caryl Comes held on initiating amiodarone given relative infrequency of V tach. He changed betablocker to bisoprolol and avoid lipophilic 2/2 behavioural changes, Add MgSO4. The patient reports that he has not done well since switching to bisoprolol.  His blood pressure has been elevated.  He has tried adjusting the dose without success.  At the present time to avoid side effects he has been taking just half of a 2.5 mg tablet.  He is requesting to go back on the atenolol which she had been on for many years.  In the hospital he was also placed empirically on magnesium 40 mg daily.  He thought that the magnesium was making him feel bad so he stopped that without any improvement in his symptoms.  In the past he has been on metoprolol and reported side effects.  He has also had side effects in the past 2 Inderal many years ago with depression. Past Medical History  Diagnosis Date  . Coronary artery disease     myoview 2011>>EF of 53% /  large area of old inferolateral infarct but no reversible ischemia          . Cardiac arrest 03/29/2008    at home without resuscitation anywhere from 7-10 minutes before EMS arrived/ The patient was noted to be in ventricular fibrillation  required defibrillation x3               . Hypercoagulable state     Questionable history of a borderline hypercoagulable state  . Hyperlipidemia   . Anoxic brain damage      resolved  . Respiratory failure 2009    Respiratory failure  and pulmonary collapse requiring intubation  . 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 PVCs (premature ventricular contractions)     occasionally  . Peripheral vascular disease   . AAA (abdominal aortic aneurysm)   . Contrast media allergy   . Myocardial infarction 1981  . Hemorrhoid   . Pacemaker   . Automatic implantable cardioverter-defibrillator in situ     Past Surgical History  Procedure Laterality Date  . Cholecystectomy, laparoscopic  08/13/2009    Calculous cholecystitis  . Insert / replace / remove pacemaker  04/07/2008      cardioverter defibrillator, [employer] Jude Single-chamber defibrillator implantation with intraoperative defibrillation threshold testing /  Durata 7121 dual-coil active defibrillator lead, serial  #[id number]   . Cardiac catheterization  04/04/2008     LAD stent patent, D1 OK, OM1 90/80/80% (2mm vessel), RCA 30%; EF 35%.  . Coronary angioplasty with stent placement  1999    PTCA and stenting of his left anterior descending artery  . Cholecystectomy    . Tonsillectomy    . Abdominal aortic endovascular stent graft N/A 12/06/2013    Procedure: ABDOMINAL AORTIC ENDOVASCULAR STENT GRAFT;  Surgeon: Rosetta Posner, MD;  Location: [facility];  Service: Vascular;  Laterality: N/A;  . Left heart catheterization with coronary angiogram N/A 02/28/2013    Procedure: LEFT HEART CATHETERIZATION WITH CORONARY ANGIOGRAM;  Surgeon: Sherren Mocha, MD;  Location: [facility] CATH LAB;  Service: Cardiovascular;  Laterality: N/A;     Current Outpatient Prescriptions  Medication Sig Dispense Refill  . aspirin 81 MG tablet Take 81 mg by mouth daily.     Marland Kitchen atenolol (TENORMIN) 25 MG tablet Take by mouth as directed. 1/2 tablet TWICE A DAY    . Cholecalciferol (VITAMIN D-3 PO) Take 300 mg by mouth daily.     . colesevelam (WELCHOL) 625 MG tablet Take 1 tablet (625 mg total) by mouth 2 (two) times daily with a meal. 180 tablet 2  . diphenhydrAMINE (BENADRYL) 50 MG  capsule Take 50 mg by mouth 1 hour prior to your procedure.CAT SCAN    . fish oil-omega-3 fatty acids 1000 MG capsule Take 1 g by mouth daily.     . Glucosamine Sulfate-MSM (GLUCOSAMINE-MSM DS PO) Take 1 tablet by mouth daily.    Marland Kitchen ibuprofen (ADVIL,MOTRIN) 200 MG tablet Take 400 mg by mouth every 8 (eight) hours as needed for pain.    Marland Kitchen lisinopril (PRINIVIL,ZESTRIL) 2.5 MG tablet Take 1 tablet by mouth  daily 90 tablet 3  . loratadine (CLARITIN) 10 MG tablet Take 10 mg by mouth daily.    . magnesium oxide (MAG-OX) 400 (241.3 MG) MG tablet Take 1 tablet (400 mg total) by mouth daily. 30 tablet 6  . Multiple Vitamin (MULTIVITAMIN WITH MINERALS) TABS Take 1 tablet by mouth daily.    . nitroGLYCERIN (NITRODUR - DOSED IN MG/24 HR) 0.2 mg/hr patch  Place 1 patch (0.2 mg total) onto the skin daily. 90 patch 3  . rosuvastatin (CRESTOR) 5 MG tablet 1 tablet every other day or as directed. 90 tablet 3   No current facility-administered medications for this visit.    Allergies:   Losartan; Losartan potassium; Metoprolol; Mydriacyl; Phenylephrine hcl; Clopidogrel bisulfate; Epinephrine; Iodine; and Statins    Social History:  The patient  reports that he quit smoking about 35 years ago. His smoking use included Cigarettes. He has a 20 pack-year smoking history. He has never used smokeless tobacco. He reports that he does not drink alcohol or use illicit drugs.   Family History:  The patient's family history includes Coronary artery disease in an other family member; Heart disease in his father; Hyperlipidemia in his father and mother.    ROS:  Please see the history of present illness.   Otherwise, review of systems are positive for none.   All other systems are reviewed and negative.    PHYSICAL EXAM: VS:  BP 146/72 mmHg  Pulse 64  Ht 5\' 9"  (1.753 m)  Wt 184 lb 6.4 oz (83.643 kg)  BMI 27.22 kg/m2 , BMI Body mass index is 27.22 kg/(m^2). GEN: Well nourished, well developed, in no acute  distress HEENT: normal Neck: no JVD, carotid bruits, or masses Cardiac: RRR; no murmurs, rubs, or gallops,no edema  Respiratory:  clear to auscultation bilaterally, normal work of breathing GI: soft, nontender, nondistended, + BS MS: no deformity or atrophy Skin: warm and dry, no rash Neuro:  Strength and sensation are intact Psych: euthymic mood, full affect   EKG:  EKG is not ordered today.    Recent Labs: 02/10/2015: Hemoglobin 15.7; Magnesium 2.2; Platelets 128* 02/23/2015: ALT 30; BUN 21; Creatinine 1.03; Potassium 3.8; Sodium 137    Lipid Panel    Component Value Date/Time   CHOL 152 02/23/2015 0805   TRIG 117.0 02/23/2015 0805   HDL 44.40 02/23/2015 0805   CHOLHDL 3 02/23/2015 0805   VLDL 23.4 02/23/2015 0805   LDLCALC 84 02/23/2015 0805      Wt Readings from Last 3 Encounters:  03/04/15 184 lb 6.4 oz (83.643 kg)  02/11/15 181 lb 4.8 oz (82.237 kg)  02/08/15 180 lb (81.647 kg)        ASSESSMENT AND PLAN:  1. Ischemic heart disease status post remote posterolateral myocardial infarction in 1981. Status post angioplasty and stent of LAD in 1999. 2. ICD in place following successful out of hospital ventricular fibrillation arrest and resuscitation in 2009 3. status post stent graft surgery for abdominal aortic aneurysm December 2014 4. dyslipidemia on low-dose Crestor  Disposition continue on same medication. Because of relative intolerance to bisoprolol, and prior intolerance to Lopressor and propranolol, we will put him back on atenolol 25 mg one half tablet twice a day as he requests.  We also want him to restart taking his magnesium 400 mg a day to help stabilize his heart rhythm.   Current medicines are reviewed at length with the patient today.  The patient has concerns regarding medicines.  He feels that the bisoprolol is not agreeing with him.  The following changes have been made:  Resume prior dose of atenolol    Orders Placed This Encounter   Procedures  . Basic metabolic panel  . Magnesium     Disposition: Return in 2 months for office visit EKG and basal metabolic panel and magnesium level.  Work harder on weight loss.  Increase activity.  We reviewed  his recent lab work which is satisfactory.  Signed, Darlin Coco, MD  03/04/2015 9:50 AM    [address], [place], [place]  [address] Phone: [phone]; Fax: [phone]

## 2015-03-05 ENCOUNTER — Other Ambulatory Visit: Payer: Self-pay

## 2015-03-05 MED ORDER — NITROGLYCERIN 0.2 MG/HR TD PT24: 0.2000 mg | MEDICATED_PATCH | Freq: Every day | TRANSDERMAL | Status: DC

## 2015-03-31 ENCOUNTER — Telehealth: Payer: Self-pay | Admitting: Cardiology

## 2015-03-31 NOTE — Telephone Encounter (Signed)
New Message   Patient is returning nurses call. Please call back. Thanks

## 2015-03-31 NOTE — Telephone Encounter (Signed)
Spoke with patient and he has not been feeling well since defibrillator went off Patient stated he did feel some better after  Dr. Mare Ferrari changed him back to Atenolol Per patient defib has not gone off Patient requested appointment to see  Dr. Mare Ferrari Scheduled appointment for Thursday, will be getting remote Defib check tomorrow night Patient agreeable to plan

## 2015-04-02 ENCOUNTER — Ambulatory Visit (INDEPENDENT_AMBULATORY_CARE_PROVIDER_SITE_OTHER): Payer: Medicare Other | Admitting: Cardiology

## 2015-04-02 ENCOUNTER — Encounter: Payer: Self-pay | Admitting: Cardiology

## 2015-04-02 ENCOUNTER — Ambulatory Visit (INDEPENDENT_AMBULATORY_CARE_PROVIDER_SITE_OTHER): Payer: Medicare Other | Admitting: *Deleted

## 2015-04-02 VITALS — BP 138/74 | HR 59 | Ht 69.0 in | Wt 182.1 lb

## 2015-04-02 DIAGNOSIS — Z79899 Other long term (current) drug therapy: Secondary | ICD-10-CM

## 2015-04-02 DIAGNOSIS — I469 Cardiac arrest, cause unspecified: Secondary | ICD-10-CM

## 2015-04-02 LAB — BASIC METABOLIC PANEL
BUN: 22 mg/dL (ref 6–23)
CO2: 30 meq/L (ref 19–32)
Calcium: 9.6 mg/dL (ref 8.4–10.5)
Chloride: 104 mEq/L (ref 96–112)
Creatinine, Ser: 1.01 mg/dL (ref 0.40–1.50)
GFR: 77.01 mL/min (ref >60.00)
GLUCOSE: 88 mg/dL (ref 70–99)
Potassium: 4.1 mEq/L (ref 3.5–5.1)
Sodium: 138 mEq/L (ref 135–145)

## 2015-04-02 MED ORDER — LISINOPRIL 2.5 MG PO TABS: 2.5000 mg | ORAL_TABLET | Freq: Two times a day (BID) | ORAL | Status: DC

## 2015-04-02 NOTE — Progress Notes (Signed)
Remote ICD transmission.   

## 2015-04-02 NOTE — Patient Instructions (Addendum)
Medication Instructions:  INCREASE YOUR LISINOPRIL TO 2.5 MG TWICE A DAY   Labwork: bmet  Testing/Procedures: NONE  Follow-Up: Keep May appointment   Any Other Special Instructions Will Be Listed Below (If Applicable).

## 2015-04-02 NOTE — Progress Notes (Signed)
Quick Note:  Please report to patient. The recent labs are stable. Continue same medication and careful diet. Potassium is good 4.1 ______

## 2015-04-02 NOTE — Progress Notes (Signed)
Cardiology Office Note   Date:  04/02/2015   ID:  Joel Lin, DOB 1941/12/04, MRN [id number]  PCP:  Kandice Hams, MD  Cardiologist:   Darlin Coco, MD   No chief complaint on file.     History of Present Illness: Joel Lin is a 74 y.o. male who presents for follow-up office visit History of Present Illness:   This pleasant 74 year old gentleman is seen for a work in office visit.  He is concerned because he still does not feel quite right.  He thinks that time his blood pressure is a little higher than desired.  He brought in a long list of his blood pressures and his symptoms appear to correlate with when his blood pressure is in the high normal range.  He has a complex past medical history. He has a history of known ischemic heart disease. He has a history of a remote myocardial infarction at the age of 31. That was a large posterolateral myocardial infarction in 1981. The patient had angioplasty and stent of his LAD in 1999. In 2009 he had an out of hospital cardiac arrest at home was successfully resuscitated and treated with hypothermia without significant neurologic sequelae. He now has a defibrillator in place and is followed by Dr. Caryl Comes. He has a history of hypercholesterolemia. His last nuclear stress test was 10/24/11 and showed an ejection fraction of 45% and a large posterolateral scar but no ischemia. His last echocardiogram was in 2009 and showed an ejection fraction of 35-40%. On 12/06/13 the patient underwent a stent graft for his abdominal aortic aneurysm. The patient did well and was discharged without complications. The patient has his defibrillator monitored through Dr. Olin Pia office. In January 2013 he had 2 episodes of ventricular tachycardia which were asymptomatic. In March 2014 he was rehospitalized after having 3 consecutive shocks from his defibrillator. He underwent cardiac catheterization by Dr. Burt Knack on 02/28/13 who told him that his  arteries have not shown any progression of atherosclerosis since his previous cardiac catheterization in 2009.  since we last saw him he has completely retired. He has been playing golf 18 holes every other week. He is not having any difficulty with shortness of breath or chest pain. This patient recently presented to [facility] ED due to ICD shock. He stated he was playing golf and just finished 9 holes and was starting on the back 9 when he suddenly felt dizzy. The next thing he knows his device shocked him. He called 911 and went back to the clubhouse where EMS arrived and gave him four aspirin.  Pt was transferred to [facility] for EP eval. He did well overnight without any ICD shocks. Troponin was negative. He was seen by Dr. Caryl Comes and found stable for discharge. He had been placed on po amiodarone but Dr. Caryl Comes held on initiating amiodarone given relative infrequency of V tach. He changed betablocker to bisoprolol and avoid lipophilic 2/2 behavioural changes, Add MgSO4. At the time of his hospitalization he was switched from atenolol to bisoprolol.  However, the patient did not feel well on the bisoprolol.  He cut the dose way back and continue to not feel well.  For this reason we switched him back to atenolol which she had been on and tolerated well for many years.  He comes back today states that he feels better but is still not feeling perfect.  Thinks that his blood pressure is still higher than desired at times.  He has  not had any shocks from his defibrillator.  He has not been getting much regular exercise but hopes to start walking more  Past Medical History  Diagnosis Date  . Coronary artery disease     myoview 2011>>EF of 53% / large area of old inferolateral infarct but no reversible ischemia          . Cardiac arrest 03/29/2008    at home without resuscitation anywhere from 7-10 minutes before EMS arrived/ The patient was noted to be in ventricular fibrillation  required  defibrillation x3               . Hypercoagulable state     Questionable history of a borderline hypercoagulable state  . Hyperlipidemia   . Anoxic brain damage      resolved  . Respiratory failure 2009    Respiratory failure and pulmonary collapse requiring intubation  . 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 PVCs (premature ventricular contractions)     occasionally  . Peripheral vascular disease   . AAA (abdominal aortic aneurysm)   . Contrast media allergy   . Myocardial infarction 1981  . Hemorrhoid   . Pacemaker   . Automatic implantable cardioverter-defibrillator in situ     Past Surgical History  Procedure Laterality Date  . Cholecystectomy, laparoscopic  08/13/2009    Calculous cholecystitis  . Insert / replace / remove pacemaker  04/07/2008      cardioverter defibrillator, St. Jude Single-chamber defibrillator implantation with intraoperative defibrillation threshold testing /  Durata 7121 dual-coil active defibrillator lead, serial  #[id number]   . Cardiac catheterization  04/04/2008     LAD stent patent, D1 OK, OM1 90/80/80% (83mm vessel), RCA 30%; EF 35%.  . Coronary angioplasty with stent placement  1999    PTCA and stenting of his left anterior descending artery  . Cholecystectomy    . Tonsillectomy    . Abdominal aortic endovascular stent graft N/A 12/06/2013    Procedure: ABDOMINAL AORTIC ENDOVASCULAR STENT GRAFT;  Surgeon: Rosetta Posner, MD;  Location: [place];  Service: Vascular;  Laterality: N/A;  . Left heart catheterization with coronary angiogram N/A 02/28/2013    Procedure: LEFT HEART CATHETERIZATION WITH CORONARY ANGIOGRAM;  Surgeon: Sherren Mocha, MD;  Location: [facility] C[facility] LAB;  Service: Cardiovascular;  Laterality: N/A;     Current Outpatient Prescriptions  Medication Sig Dispense Refill  . aspirin 81 MG tablet Take 81 mg by mouth daily.     Marland Kitchen atenolol (TENORMIN) 25 MG tablet Take by mouth as directed. 1/2 tablet TWICE A DAY     . Cholecalciferol (VITAMIN D-3 PO) Take 300 mg by mouth daily.     . colesevelam (WELCHOL) 625 MG tablet Take 1 tablet (625 mg total) by mouth 2 (two) times daily with a meal. 180 tablet 2  . diphenhydrAMINE (BENADRYL) 50 MG capsule Take 50 mg by mouth 1 hour prior to your procedure.CAT SCAN    . fish oil-omega-3 fatty acids 1000 MG capsule Take 1 g by mouth daily.     . Glucosamine Sulfate-MSM (GLUCOSAMINE-MSM DS PO) Take 1 tablet by mouth daily.    Marland Kitchen ibuprofen (ADVIL,MOTRIN) 200 MG tablet Take 400 mg by mouth every 8 (eight) hours as needed for pain.    Marland Kitchen lisinopril (PRINIVIL,ZESTRIL) 2.5 MG tablet Take 1 tablet (2.5 mg total) by mouth 2 (two) times daily. 180 tablet 3  . loratadine (CLARITIN) 10 MG tablet Take 10 mg by mouth daily.    Marland Kitchen  magnesium oxide (MAG-OX) 400 (241.3 MG) MG tablet Take 1 tablet (400 mg total) by mouth daily. 30 tablet 6  . Multiple Vitamin (MULTIVITAMIN WITH MINERALS) TABS Take 1 tablet by mouth daily.    . nitroGLYCERIN (NITRODUR - DOSED IN MG/24 HR) 0.2 mg/hr patch Place 1 patch (0.2 mg total) onto the skin daily. 90 patch 3  . rosuvastatin (CRESTOR) 5 MG tablet 1 tablet every other day or as directed. 90 tablet 3   No current facility-administered medications for this visit.    Allergies:   Losartan; Losartan potassium; Metoprolol; Mydriacyl; Phenylephrine hcl; Bisoprolol; Clopidogrel bisulfate; Epinephrine; Iodine; and Statins    Social History:  The patient  reports that he quit smoking about 35 years ago. His smoking use included Cigarettes. He has a 20 pack-year smoking history. He has never used smokeless tobacco. He reports that he does not drink alcohol or use illicit drugs.   Family History:  The patient's family history includes Coronary artery disease in an other family member; Heart disease in his father; Hyperlipidemia in his father and mother.    ROS:  Please see the history of present illness.   Otherwise, review of systems are positive for none.    All other systems are reviewed and negative.    PHYSICAL EXAM: VS:  BP 138/74 mmHg  Pulse 59  Ht 5\' 9"  (1.753 m)  Wt 182 lb 1.9 oz (82.609 kg)  BMI 26.88 kg/m2 , BMI Body mass index is 26.88 kg/(m^2). GEN: Well nourished, well developed, in no acute distress HEENT: normal Neck: no JVD, carotid bruits, or masses Cardiac: RRR; no murmurs, rubs, or gallops,no edema  Respiratory:  clear to auscultation bilaterally, normal work of breathing GI: soft, nontender, nondistended, + BS MS: no deformity or atrophy Skin: warm and dry, no rash Neuro:  Strength and sensation are intact Psych: euthymic mood, full affect   EKG:  EKG is not ordered today.    Recent Labs: 02/10/2015: Hemoglobin 15.7; Magnesium 2.2; Platelets 128* 02/23/2015: ALT 30; BUN 21; Creatinine 1.03; Potassium 3.8; Sodium 137    Lipid Panel    Component Value Date/Time   CHOL 152 02/23/2015 0805   TRIG 117.0 02/23/2015 0805   HDL 44.40 02/23/2015 0805   CHOLHDL 3 02/23/2015 0805   VLDL 23.4 02/23/2015 0805   LDLCALC 84 02/23/2015 0805      Wt Readings from Last 3 Encounters:  04/02/15 182 lb 1.9 oz (82.609 kg)  03/04/15 184 lb 6.4 oz (83.643 kg)  02/11/15 181 lb 4.8 oz (82.237 kg)         ASSESSMENT AND PLAN:  1. Ischemic heart disease status post remote posterolateral myocardial infarction in 1981. Status post angioplasty and stent of LAD in 1999. 2. ICD in place following successful out of hospital ventricular fibrillation arrest and resuscitation in 2009 3. status post stent graft surgery for abdominal aortic aneurysm December 2014 4. dyslipidemia on low-dose Crestor 5.  Borderline high normal blood pressures correlate with his feeling bad.  I am: We will increase his lisinopril up to 2.5 mg twice a day for better blood pressure control.  We will get a basal metabolic panel today.  He will keep his May 18 office visit he will get fasting labs ahead of time  Current medicines are reviewed at length  with the patient today.  The patient does not have concerns regarding medicines.  The following changes have been made:  Increase lisinopril to twice a day  Labs/ tests ordered today  include:   Orders Placed This Encounter  Procedures  . Basic metabolic panel     Signed, Darlin Coco, MD  04/02/2015 2:53 PM    [facility] [place], [place], [place]  [address] Phone: [phone]; Fax: [phone]

## 2015-04-05 LAB — MDC_IDC_ENUM_SESS_TYPE_REMOTE
Battery Voltage: 2.56 V
Brady Statistic RV Percent Paced: 1 %
Date Time Interrogation Session: 20160421073307
HighPow Impedance: 48 Ohm
Implantable Pulse Generator Serial Number: 549899
Lead Channel Impedance Value: 300 Ohm
Lead Channel Sensing Intrinsic Amplitude: 6 mV
Lead Channel Setting Pacing Amplitude: 2.5 V
Lead Channel Setting Sensing Sensitivity: 0.3 mV
MDC IDC MSMT BATTERY REMAINING LONGEVITY: 28 mo
MDC IDC MSMT LEADCHNL RV PACING THRESHOLD AMPLITUDE: 1.25 V
MDC IDC MSMT LEADCHNL RV PACING THRESHOLD PULSEWIDTH: 0.6 ms
MDC IDC SET LEADCHNL RV PACING PULSEWIDTH: 0.6 ms
MDC IDC SET ZONE DETECTION INTERVAL: 250 ms
MDC IDC SET ZONE DETECTION INTERVAL: 280 ms
Zone Setting Detection Interval: 330 ms

## 2015-04-28 ENCOUNTER — Other Ambulatory Visit (INDEPENDENT_AMBULATORY_CARE_PROVIDER_SITE_OTHER): Payer: Medicare Other | Admitting: *Deleted

## 2015-04-28 DIAGNOSIS — E782 Mixed hyperlipidemia: Secondary | ICD-10-CM

## 2015-04-28 DIAGNOSIS — I255 Ischemic cardiomyopathy: Secondary | ICD-10-CM

## 2015-04-28 LAB — BASIC METABOLIC PANEL
BUN: 18 mg/dL (ref 6–23)
CALCIUM: 9.5 mg/dL (ref 8.4–10.5)
CO2: 29 meq/L (ref 19–32)
Chloride: 103 mEq/L (ref 96–112)
Creatinine, Ser: 0.97 mg/dL (ref 0.40–1.50)
GFR: 80.67 mL/min (ref >60.00)
GLUCOSE: 101 mg/dL — AB (ref 70–99)
Potassium: 4.1 mEq/L (ref 3.5–5.1)
Sodium: 136 mEq/L (ref 135–145)

## 2015-04-28 LAB — MAGNESIUM: Magnesium: 2.3 mg/dL (ref 1.5–2.5)

## 2015-04-28 NOTE — Progress Notes (Signed)
Quick Note:  Please report to patient. The recent labs are stable. Continue same medication and careful diet. ______ 

## 2015-04-29 ENCOUNTER — Other Ambulatory Visit: Payer: Medicare Other

## 2015-04-30 ENCOUNTER — Telehealth: Payer: Self-pay | Admitting: Cardiology

## 2015-04-30 NOTE — Telephone Encounter (Signed)
New message       Returning Joel Lin's call to get lab results

## 2015-04-30 NOTE — Telephone Encounter (Signed)
-----   Message from Darlin Coco, MD sent at 04/28/2015  6:18 PM EDT ----- Please report to patient.  The recent labs are stable. Continue same medication and careful diet.

## 2015-04-30 NOTE — Telephone Encounter (Signed)
Advised wife of labs and will print copy for ov Monday

## 2015-05-01 ENCOUNTER — Encounter: Payer: Self-pay | Admitting: Cardiology

## 2015-05-04 ENCOUNTER — Other Ambulatory Visit: Payer: Medicare Other

## 2015-05-04 ENCOUNTER — Ambulatory Visit (INDEPENDENT_AMBULATORY_CARE_PROVIDER_SITE_OTHER): Payer: Medicare Other | Admitting: Cardiology

## 2015-05-04 ENCOUNTER — Encounter: Payer: Self-pay | Admitting: Cardiology

## 2015-05-04 VITALS — BP 126/70 | HR 57 | Ht 69.0 in | Wt 183.8 lb

## 2015-05-04 DIAGNOSIS — I255 Ischemic cardiomyopathy: Secondary | ICD-10-CM | POA: Diagnosis not present

## 2015-05-04 DIAGNOSIS — Z79899 Other long term (current) drug therapy: Secondary | ICD-10-CM | POA: Diagnosis not present

## 2015-05-04 NOTE — Patient Instructions (Signed)
Medication Instructions:  Your physician recommends that you continue on your current medications as directed. Please refer to the Current Medication list given to you today.  Labwork: none  Testing/Procedures: none  Follow-Up: Your physician recommends that you schedule a follow-up appointment in: 4 months with fasting labs (lp/bmet/hfp/cbc/magnesium)

## 2015-05-04 NOTE — Progress Notes (Signed)
Cardiology Office Note   Date:  05/04/2015   ID:  Joel Lin, DOB [birth year], MRN [id number]  PCP:  Kandice Hams, MD  Cardiologist: Darlin Coco MD  No chief complaint on file.     History of Present Illness: Joel Lin is a 74 y.o. male who presents for scheduled follow-up office visit.    He has a complex past medical history. He has a history of known ischemic heart disease. He has a history of a remote myocardial infarction at the age of 56. That was a large posterolateral myocardial infarction in 1981. The patient had angioplasty and stent of his LAD in 1999. In 2009 he had an out of hospital cardiac arrest at home was successfully resuscitated and treated with hypothermia without significant neurologic sequelae. He now has a defibrillator in place and is followed by Dr. Caryl Comes. He has a history of hypercholesterolemia. His last nuclear stress test was 10/24/11 and showed an ejection fraction of 45% and a large posterolateral scar but no ischemia. His last echocardiogram was in 2009 and showed an ejection fraction of 35-40%. On 12/06/13 the patient underwent a stent graft for his abdominal aortic aneurysm. The patient did well and was discharged without complications. The patient has his defibrillator monitored through Dr. Olin Pia office. In January 2013 he had 2 episodes of ventricular tachycardia which were asymptomatic. In March 2014 he was rehospitalized after having 3 consecutive shocks from his defibrillator. He underwent cardiac catheterization by Dr. Burt Knack on 02/28/13 who told him that his arteries have not shown any progression of atherosclerosis since his previous cardiac catheterization in 2009.   On 02/10/15 the patient presented to [facility] ED due to ICD shock. He stated he was playing golf and just finished 9 holes and was starting on the back 9 when he suddenly felt dizzy. The next thing he knows his device shocked him. He called 911 and went back  to the clubhouse where EMS arrived and gave him four aspirin.  Pt was transferred to [facility] for EP eval. He did well overnight without any ICD shocks. Troponin was negative. He was seen by Dr. Caryl Comes and found stable for discharge. He had been placed on po amiodarone but Dr. Caryl Comes held on initiating amiodarone given relative infrequency of V tach. He changed betablocker to bisoprolol and avoid lipophilic 2/2 behavioural changes, Add MgSO4. At the time of his hospitalization he was switched from atenolol to bisoprolol. However, the patient did not feel well on the bisoprolol. He cut the dose way back and continue to not feel well. For this reason we switched him back to atenolol which he had been on and tolerated well for many years. He has been feeling stronger.  He is starting to get more regular exercise.  He finds that his heart rate does not go up as high as it previously did and at the same time it is a little slower to return to baseline.  His baseline pulses in the 50s most of the time.  He has not had any further shocks from his defibrillator.  Past Medical History  Diagnosis Date  . Coronary artery disease     myoview 2011>>EF of 53% / large area of old inferolateral infarct but no reversible ischemia          . Cardiac arrest 03/29/2008    at home without resuscitation anywhere from 7-10 minutes before EMS arrived/ The patient was noted to be in ventricular fibrillation  required defibrillation  x3               . Hypercoagulable state     Questionable history of a borderline hypercoagulable state  . Hyperlipidemia   . Anoxic brain damage      resolved  . Respiratory failure 2009    Respiratory failure and pulmonary collapse requiring intubation  . 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 PVCs (premature ventricular contractions)     occasionally  . Peripheral vascular disease   . AAA (abdominal aortic aneurysm)   . Contrast media allergy   .  Myocardial infarction 1981  . Hemorrhoid   . Pacemaker   . Automatic implantable cardioverter-defibrillator in situ     Past Surgical History  Procedure Laterality Date  . Cholecystectomy, laparoscopic  08/13/2009    Calculous cholecystitis  . Insert / replace / remove pacemaker  04/07/2008      cardioverter defibrillator, St. Jude Single-chamber defibrillator implantation with intraoperative defibrillation threshold testing /  Durata 7121 dual-coil active defibrillator lead, serial  #[id number]   . Cardiac catheterization  04/04/2008     LAD stent patent, D1 OK, OM1 90/80/80% (71mm vessel), RCA 30%; EF 35%.  . Coronary angioplasty with stent placement  1999    PTCA and stenting of his left anterior descending artery  . Cholecystectomy    . Tonsillectomy    . Abdominal aortic endovascular stent graft N/A 12/06/2013    Procedure: ABDOMINAL AORTIC ENDOVASCULAR STENT GRAFT;  Surgeon: Rosetta Posner, MD;  Location: [place];  Service: Vascular;  Laterality: N/A;  . Left heart catheterization with coronary angiogram N/A 02/28/2013    Procedure: LEFT HEART CATHETERIZATION WITH CORONARY ANGIOGRAM;  Surgeon: Sherren Mocha, MD;  Location: [facility] CATH LAB;  Service: Cardiovascular;  Laterality: N/A;     Current Outpatient Prescriptions  Medication Sig Dispense Refill  . aspirin 81 MG tablet Take 81 mg by mouth daily.     Marland Kitchen atenolol (TENORMIN) 25 MG tablet Take 12.5 mg by mouth 2 (two) times daily. 1/2 tablet TWICE A DAY    . Cholecalciferol (VITAMIN D-3 PO) Take 300 mg by mouth daily.     . colesevelam (WELCHOL) 625 MG tablet Take 1 tablet (625 mg total) by mouth 2 (two) times daily with a meal. 180 tablet 2  . diphenhydrAMINE (BENADRYL) 50 MG capsule Take 50 mg by mouth 1 hour prior to your procedure.CAT SCAN    . fish oil-omega-3 fatty acids 1000 MG capsule Take 1 g by mouth daily.     . Glucosamine Sulfate-MSM (GLUCOSAMINE-MSM DS PO) Take 1 tablet by mouth daily.    Marland Kitchen ibuprofen (ADVIL,MOTRIN) 200 MG  tablet Take 400 mg by mouth every 8 (eight) hours as needed for pain.    Marland Kitchen lisinopril (PRINIVIL,ZESTRIL) 2.5 MG tablet Take 1 tablet (2.5 mg total) by mouth 2 (two) times daily. 180 tablet 3  . loratadine (CLARITIN) 10 MG tablet Take 10 mg by mouth daily.    . magnesium oxide (MAG-OX) 400 (241.3 MG) MG tablet Take 1 tablet (400 mg total) by mouth daily. 30 tablet 6  . Multiple Vitamin (MULTIVITAMIN WITH MINERALS) TABS Take 1 tablet by mouth daily.    . nitroGLYCERIN (NITRODUR - DOSED IN MG/24 HR) 0.2 mg/hr patch Place 1 patch (0.2 mg total) onto the skin daily. 90 patch 3  . nitroGLYCERIN (NITROSTAT) 0.4 MG SL tablet Place 0.4 mg under the tongue every 5 (five) minutes as needed for chest pain (for chest pain).    Marland Kitchen  rosuvastatin (CRESTOR) 5 MG tablet Take 5 mg by mouth every other day.     No current facility-administered medications for this visit.    Allergies:   Losartan; Losartan potassium; Metoprolol; Mydriacyl; Phenylephrine hcl; Bisoprolol; Clopidogrel bisulfate; Epinephrine; Iodine; and Statins    Social History:  The patient  reports that he quit smoking about 35 years ago. His smoking use included Cigarettes. He has a 20 pack-year smoking history. He has never used smokeless tobacco. He reports that he does not drink alcohol or use illicit drugs.   Family History:  The patient's family history includes Heart disease in his father and mother; Hyperlipidemia in his father and mother; Mitral valve prolapse in his sister; Stomach cancer in an other family member.    ROS:  Please see the history of present illness.   Otherwise, review of systems are positive for none.   All other systems are reviewed and negative.    PHYSICAL EXAM: VS:  BP 126/70 mmHg  Pulse 57  Ht 5\' 9"  (1.753 m)  Wt 183 lb 12.8 oz (83.371 kg)  BMI 27.13 kg/m2 , BMI Body mass index is 27.13 kg/(m^2). GEN: Well nourished, well developed, in no acute distress HEENT: normal Neck: no JVD, carotid bruits, or  masses Cardiac: RRR; no murmurs, rubs, or gallops,no edema  Respiratory:  clear to auscultation bilaterally, normal work of breathing GI: soft, nontender, nondistended, + BS MS: no deformity or atrophy Skin: warm and dry, no rash Neuro:  Strength and sensation are intact Psych: euthymic mood, full affect   EKG:  EKG is ordered today. The ekg ordered today demonstrates sinus bradycardia at 57 bpm.  There is first-degree AV block.  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  There is a pattern of an old inferior and lateral myocardial infarction.   Recent Labs: 02/10/2015: Hemoglobin 15.7; Platelets 128* 02/23/2015: ALT 30 04/28/2015: BUN 18; Creatinine 0.97; Magnesium 2.3; Potassium 4.1; Sodium 136    Lipid Panel    Component Value Date/Time   CHOL 152 02/23/2015 0805   TRIG 117.0 02/23/2015 0805   HDL 44.40 02/23/2015 0805   CHOLHDL 3 02/23/2015 0805   VLDL 23.4 02/23/2015 0805   LDLCALC 84 02/23/2015 0805      Wt Readings from Last 3 Encounters:  05/04/15 183 lb 12.8 oz (83.371 kg)  04/02/15 182 lb 1.9 oz (82.609 kg)  03/04/15 184 lb 6.4 oz (83.643 kg)         ASSESSMENT AND PLAN:  1. Ischemic heart disease status post remote posterolateral myocardial infarction in 1981. Status post angioplasty and stent of LAD in 1999. 2. ICD in place following successful out of hospital ventricular fibrillation arrest and resuscitation in 2009 3. status post stent graft surgery for abdominal aortic aneurysm December 2014 4. dyslipidemia on low-dose Crestor 5.History of hypertension.  Blood pressure now normal on low-dose atenolol and lisinopril  Plan: Continue lisinopril 2.5 mg twice a day for better blood pressure control.   Current medicines are reviewed at length with the patient today. The patient does not have concerns regarding medicines.  He will continue current medication.  Recheck in 4 months for office visit lipid panel hepatic function panel basal metabolic panel CBC  and magnesium level.    Current medicines are reviewed at length with the patient today.  The patient does not have concerns regarding medicines.  The following changes have been made:  no change  Labs/ tests ordered today include:   Orders Placed This Encounter  Procedures  .  Lipid panel  . Hepatic function panel  . Basic metabolic panel  . CBC with Differential/Platelet  . Magnesium  . EKG 12-Lead     Signed, Darlin Coco MD 05/04/2015 3:36 PM    [facility] [address], [place], [place]  [address] Phone: [phone]; Fax: [phone]

## 2015-05-05 ENCOUNTER — Encounter: Payer: Self-pay | Admitting: Internal Medicine

## 2015-06-24 ENCOUNTER — Encounter (HOSPITAL_COMMUNITY): Payer: Self-pay | Admitting: *Deleted

## 2015-06-24 ENCOUNTER — Inpatient Hospital Stay (HOSPITAL_COMMUNITY)
Admission: EM | Admit: 2015-06-24 | Discharge: 2015-06-26 | DRG: 309 | Disposition: A | Discharge status: Home / Self Care | Payer: Medicare Other | Attending: Cardiology | Admitting: Cardiology

## 2015-06-24 ENCOUNTER — Emergency Department (HOSPITAL_COMMUNITY): Payer: Medicare Other

## 2015-06-24 ENCOUNTER — Encounter: Payer: Self-pay | Admitting: Internal Medicine

## 2015-06-24 DIAGNOSIS — G931 Anoxic brain damage, not elsewhere classified: Secondary | ICD-10-CM | POA: Diagnosis present

## 2015-06-24 DIAGNOSIS — Z7982 Long term (current) use of aspirin: Secondary | ICD-10-CM

## 2015-06-24 DIAGNOSIS — Z955 Presence of coronary angioplasty implant and graft: Secondary | ICD-10-CM

## 2015-06-24 DIAGNOSIS — I255 Ischemic cardiomyopathy: Secondary | ICD-10-CM

## 2015-06-24 DIAGNOSIS — Z888 Allergy status to other drugs, medicaments and biological substances status: Secondary | ICD-10-CM

## 2015-06-24 DIAGNOSIS — Z91041 Radiographic dye allergy status: Secondary | ICD-10-CM

## 2015-06-24 DIAGNOSIS — R42 Dizziness and giddiness: Secondary | ICD-10-CM

## 2015-06-24 DIAGNOSIS — I10 Essential (primary) hypertension: Secondary | ICD-10-CM | POA: Diagnosis present

## 2015-06-24 DIAGNOSIS — I4729 Other ventricular tachycardia: Secondary | ICD-10-CM

## 2015-06-24 DIAGNOSIS — I472 Ventricular tachycardia, unspecified: Secondary | ICD-10-CM

## 2015-06-24 DIAGNOSIS — Z87891 Personal history of nicotine dependence: Secondary | ICD-10-CM

## 2015-06-24 DIAGNOSIS — I5022 Chronic systolic (congestive) heart failure: Secondary | ICD-10-CM | POA: Diagnosis present

## 2015-06-24 DIAGNOSIS — E785 Hyperlipidemia, unspecified: Secondary | ICD-10-CM | POA: Diagnosis not present

## 2015-06-24 DIAGNOSIS — Z9049 Acquired absence of other specified parts of digestive tract: Secondary | ICD-10-CM | POA: Diagnosis present

## 2015-06-24 DIAGNOSIS — I2510 Atherosclerotic heart disease of native coronary artery without angina pectoris: Secondary | ICD-10-CM | POA: Diagnosis present

## 2015-06-24 DIAGNOSIS — Z95810 Presence of automatic (implantable) cardiac defibrillator: Secondary | ICD-10-CM

## 2015-06-24 DIAGNOSIS — I739 Peripheral vascular disease, unspecified: Secondary | ICD-10-CM | POA: Diagnosis present

## 2015-06-24 DIAGNOSIS — E782 Mixed hyperlipidemia: Secondary | ICD-10-CM | POA: Diagnosis present

## 2015-06-24 DIAGNOSIS — I252 Old myocardial infarction: Secondary | ICD-10-CM

## 2015-06-24 DIAGNOSIS — Z8674 Personal history of sudden cardiac arrest: Secondary | ICD-10-CM

## 2015-06-24 DIAGNOSIS — D6859 Other primary thrombophilia: Secondary | ICD-10-CM | POA: Diagnosis present

## 2015-06-24 LAB — TROPONIN I: TROPONIN I: 0.05 ng/mL — AB (ref <0.031)

## 2015-06-24 LAB — CBC WITH DIFFERENTIAL/PLATELET
BASOS ABS: 0 10*3/uL (ref 0.0–0.1)
BASOS PCT: 1 % (ref 0–1)
EOS ABS: 0.2 10*3/uL (ref 0.0–0.7)
Eosinophils Relative: 4 % (ref 0–5)
HEMATOCRIT: 43.7 % (ref 39.0–52.0)
Hemoglobin: 14.6 g/dL (ref 13.0–17.0)
Lymphocytes Relative: 31 % (ref 12–46)
Lymphs Abs: 1.6 10*3/uL (ref 0.7–4.0)
MCH: 28.2 pg (ref 26.0–34.0)
MCHC: 33.4 g/dL (ref 30.0–36.0)
MCV: 84.4 fL (ref 78.0–100.0)
MONOS PCT: 14 % — AB (ref 3–12)
Monocytes Absolute: 0.7 10*3/uL (ref 0.1–1.0)
NEUTROS PCT: 50 % (ref 43–77)
Neutro Abs: 2.7 10*3/uL (ref 1.7–7.7)
PLATELETS: 126 10*3/uL — AB (ref 150–400)
RBC: 5.18 MIL/uL (ref 4.22–5.81)
RDW: 13 % (ref 11.5–15.5)
WBC: 5.2 10*3/uL (ref 4.0–10.5)

## 2015-06-24 LAB — BASIC METABOLIC PANEL
Anion gap: 8 (ref 5–15)
BUN: 20 mg/dL (ref 6–20)
CO2: 23 mmol/L (ref 22–32)
Calcium: 9.2 mg/dL (ref 8.9–10.3)
Chloride: 108 mmol/L (ref 101–111)
Creatinine, Ser: 1.1 mg/dL (ref 0.61–1.24)
GFR calc Af Amer: >60 mL/min (ref >60)
Glucose, Bld: 99 mg/dL (ref 65–99)
POTASSIUM: 4.2 mmol/L (ref 3.5–5.1)
Sodium: 139 mmol/L (ref 135–145)

## 2015-06-24 LAB — MRSA PCR SCREENING: MRSA BY PCR: NEGATIVE

## 2015-06-24 LAB — MAGNESIUM: Magnesium: 2.2 mg/dL (ref 1.7–2.4)

## 2015-06-24 LAB — TSH: TSH: 1.963 u[IU]/mL (ref 0.350–4.500)

## 2015-06-24 LAB — I-STAT TROPONIN, ED: TROPONIN I, POC: 0.02 ng/mL (ref 0.00–0.08)

## 2015-06-24 LAB — PHOSPHORUS: Phosphorus: 2.9 mg/dL (ref 2.5–4.6)

## 2015-06-24 MED ORDER — ADULT MULTIVITAMIN W/MINERALS CH: 1.0000 | ORAL_TABLET | Freq: Every day | ORAL | Status: DC

## 2015-06-24 MED ORDER — ASPIRIN EC 81 MG PO TBEC: 81.0000 mg | DELAYED_RELEASE_TABLET | Freq: Every day | ORAL | Status: DC

## 2015-06-24 MED ORDER — OMEGA-3 FATTY ACIDS 1000 MG PO CAPS
1.0000 g | ORAL_CAPSULE | Freq: Every day | ORAL | Status: DC
Start: 2015-06-24 — End: 2015-06-24

## 2015-06-24 MED ORDER — HEPARIN SODIUM (PORCINE) 5000 UNIT/ML IJ SOLN
5000.0000 [IU] | Freq: Three times a day (TID) | INTRAMUSCULAR | Status: DC
Start: 2015-06-24 — End: 2015-06-26
  Administered 2015-06-24 – 2015-06-26 (×6): 5000 [IU] via SUBCUTANEOUS
  Filled 2015-06-24 (×7): qty 1

## 2015-06-24 MED ORDER — ASPIRIN 81 MG PO CHEW: 324.0000 mg | CHEWABLE_TABLET | ORAL | Status: AC

## 2015-06-24 MED ORDER — LORATADINE 10 MG PO TABS
10.0000 mg | ORAL_TABLET | Freq: Every day | ORAL | Status: DC
  Administered 2015-06-25 – 2015-06-26 (×2): 10 mg via ORAL
  Filled 2015-06-24 (×2): qty 1

## 2015-06-24 MED ORDER — MAGNESIUM OXIDE 400 (241.3 MG) MG PO TABS
400.0000 mg | ORAL_TABLET | Freq: Every day | ORAL | Status: DC
  Administered 2015-06-25 – 2015-06-26 (×2): 400 mg via ORAL
  Filled 2015-06-24 (×2): qty 1

## 2015-06-24 MED ORDER — ACETAMINOPHEN 325 MG PO TABS: 650.0000 mg | ORAL_TABLET | ORAL | Status: DC | PRN

## 2015-06-24 MED ORDER — ASPIRIN 300 MG RE SUPP: 300.0000 mg | RECTAL | Status: AC

## 2015-06-24 MED ORDER — LORATADINE 10 MG PO TABS: 10.0000 mg | ORAL_TABLET | Freq: Every day | ORAL | Status: DC

## 2015-06-24 MED ORDER — ROSUVASTATIN CALCIUM 5 MG PO TABS
5.0000 mg | ORAL_TABLET | ORAL | Status: DC
  Administered 2015-06-24: 5 mg via ORAL
  Filled 2015-06-24 (×2): qty 1

## 2015-06-24 MED ORDER — ASPIRIN EC 81 MG PO TBEC
81.0000 mg | DELAYED_RELEASE_TABLET | Freq: Every day | ORAL | Status: DC
  Administered 2015-06-25 – 2015-06-26 (×2): 81 mg via ORAL
  Filled 2015-06-24 (×2): qty 1

## 2015-06-24 MED ORDER — NITROGLYCERIN 0.2 MG/HR TD PT24
0.2000 mg | MEDICATED_PATCH | Freq: Every day | TRANSDERMAL | Status: DC
  Administered 2015-06-25 – 2015-06-26 (×2): 0.2 mg via TRANSDERMAL
  Filled 2015-06-24 (×2): qty 1

## 2015-06-24 MED ORDER — COLESEVELAM HCL 625 MG PO TABS
625.0000 mg | ORAL_TABLET | Freq: Two times a day (BID) | ORAL | Status: DC
  Administered 2015-06-25 – 2015-06-26 (×3): 625 mg via ORAL
  Filled 2015-06-24 (×6): qty 1

## 2015-06-24 MED ORDER — ONDANSETRON HCL 4 MG/2ML IJ SOLN: 4.0000 mg | Freq: Four times a day (QID) | INTRAMUSCULAR | Status: DC | PRN

## 2015-06-24 MED ORDER — OMEGA-3-ACID ETHYL ESTERS 1 G PO CAPS
1.0000 g | ORAL_CAPSULE | Freq: Every day | ORAL | Status: DC
  Administered 2015-06-25 – 2015-06-26 (×2): 1 g via ORAL
  Filled 2015-06-24 (×2): qty 1

## 2015-06-24 MED ORDER — ADULT MULTIVITAMIN W/MINERALS CH
1.0000 | ORAL_TABLET | Freq: Every day | ORAL | Status: DC
  Administered 2015-06-25 – 2015-06-26 (×2): 1 via ORAL
  Filled 2015-06-24 (×2): qty 1

## 2015-06-24 MED ORDER — ROSUVASTATIN CALCIUM 5 MG PO TABS: 5.0000 mg | ORAL_TABLET | ORAL | Status: DC

## 2015-06-24 MED ORDER — NITROGLYCERIN 0.4 MG SL SUBL: 0.4000 mg | SUBLINGUAL_TABLET | SUBLINGUAL | Status: DC | PRN

## 2015-06-24 MED ORDER — LISINOPRIL 2.5 MG PO TABS
2.5000 mg | ORAL_TABLET | Freq: Two times a day (BID) | ORAL | Status: DC
  Administered 2015-06-24 – 2015-06-25 (×3): 2.5 mg via ORAL
  Filled 2015-06-24 (×5): qty 1

## 2015-06-24 MED ORDER — ATENOLOL 12.5 MG HALF TABLET
12.5000 mg | ORAL_TABLET | Freq: Two times a day (BID) | ORAL | Status: DC
  Administered 2015-06-24 – 2015-06-25 (×2): 12.5 mg via ORAL
  Filled 2015-06-24 (×3): qty 1

## 2015-06-24 NOTE — ED Notes (Signed)
Pt reports taking 324mg  asprin. Pt has a Metallurgist

## 2015-06-24 NOTE — ED Notes (Signed)
Interrogation Report given to [place], [place]

## 2015-06-24 NOTE — ED Notes (Signed)
Per EMS- pt was outside and felt dizzy. Pt went back inside and felt his defibrillator fire at 1140. Pt denies LOC. Alert and oriented at this time. Pt had recent medication changes. Pt noted to have a nitro patch in place from 8am today.

## 2015-06-24 NOTE — H&P (Signed)
Patient ID: GENNARO LIZOTTE MRN: [id number], DOB/AGE: October 05, 1941   Admit date: 06/24/2015   Primary Physician: Kandice Hams, MD Primary Cardiologist: Dr. Pilar Plate  Pt. Profile:  74 year old gentleman with history of remote large posterolateral myocardial infarction in 1981.  He has a past history of sudden cardiac death with successful resuscitation.  He has a defibrillator.  He had defibrillator shock today.  Problem List  Past Medical History  Diagnosis Date  . Coronary artery disease     myoview 2011>>EF of 53% / large area of old inferolateral infarct but no reversible ischemia          . Cardiac arrest 03/29/2008    at home without resuscitation anywhere from 7-10 minutes before EMS arrived/ The patient was noted to be in ventricular fibrillation  required defibrillation x3               . Hypercoagulable state     Questionable history of a borderline hypercoagulable state  . Hyperlipidemia   . Anoxic brain damage      resolved  . Respiratory failure 2009    Respiratory failure and pulmonary collapse requiring intubation  . 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 PVCs (premature ventricular contractions)     occasionally  . Peripheral vascular disease   . AAA (abdominal aortic aneurysm)   . Contrast media allergy   . Myocardial infarction 1981  . Hemorrhoid   . Pacemaker   . Automatic implantable cardioverter-defibrillator in situ     Past Surgical History  Procedure Laterality Date  . Cholecystectomy, laparoscopic  08/13/2009    Calculous cholecystitis  . Insert / replace / remove pacemaker  04/07/2008      cardioverter defibrillator, St. Jude Single-chamber defibrillator implantation with intraoperative defibrillation threshold testing /  Durata 7121 dual-coil active defibrillator lead, serial  #[id number]   . Cardiac catheterization  04/04/2008     LAD stent patent, D1 OK, OM1 90/80/80% (2mm vessel), RCA 30%; EF  35%.  . Coronary angioplasty with stent placement  1999    PTCA and stenting of his left anterior descending artery  . Cholecystectomy    . Tonsillectomy    . Abdominal aortic endovascular stent graft N/A 12/06/2013    Procedure: ABDOMINAL AORTIC ENDOVASCULAR STENT GRAFT;  Surgeon: Rosetta Posner, MD;  Location: [place];  Service: Vascular;  Laterality: N/A;  . Left heart catheterization with coronary angiogram N/A 02/28/2013    Procedure: LEFT HEART CATHETERIZATION WITH CORONARY ANGIOGRAM;  Surgeon: Sherren Mocha, MD;  Location: [facility] CATH LAB;  Service: Cardiovascular;  Laterality: N/A;     Allergies  Allergies  Allergen Reactions  . Losartan Other (See Comments)    Irregular heartbeat  . Losartan Potassium Palpitations  . Metoprolol Other (See Comments)    Irregular heartbeat  . Mydriacyl [Tropicamide] Other (See Comments)    Elevated heartbeat  . Phenylephrine Hcl Other (See Comments)    Increased heartrate  . Bisoprolol     Feels it does not agree with him  . Clopidogrel Bisulfate Hives    Hives   . Epinephrine Other (See Comments)    Elevated heartbeat  . Iodine Hives    "Renografin"  . Statins Other (See Comments)    Muscle cramps    HPI  This pleasant 74 year old gentleman has a history of known ischemic heart disease.He has a history of a remote myocardial infarction at the age of 72. That was a large posterolateral myocardial infarction in  1981. The patient had angioplasty and stent of his LAD in 1999. In 2009 he had an out of hospital cardiac arrest at home was successfully resuscitated and treated with hypothermia without significant neurologic sequelae. He now has a defibrillator in place and is followed by Dr. Caryl Comes. He has a history of hypercholesterolemia. His last nuclear stress test was 10/24/11 and showed an ejection fraction of 45% and a large posterolateral scar but no ischemia.  His last echocardiogram was 12/31/12 and showed an ejection fraction of 40%.  On  12/06/13 the patient underwent a stent graft for his abdominal aortic aneurysm. The patient did well and was discharged without complications. The patient has his defibrillator monitored through Dr. Olin Pia office. In January 2013 he had 2 episodes of ventricular tachycardia which were asymptomatic. In March 2014 he was rehospitalized after having 3 consecutive shocks from his defibrillator. He underwent cardiac catheterization by Dr. Burt Knack on 02/28/13 who told him that his arteries have not shown any progression of atherosclerosis since his previous cardiac catheterization in 2009.   On 02/10/15 the patient presented to [facility] ED due to ICD shock. He stated he was playing golf and just finished 9 holes and was starting on the back 9 when he suddenly felt dizzy. The next thing he knows his device shocked him. He called 911 and went back to [place] club[place] where EMS arrived and gave him four aspirin.  Pt was transferred to [facility] for EP eval. He did well overnight without any ICD shocks. Troponin was negative. He was seen by Dr. Caryl Comes and found stable for discharge. He had been placed on po amiodarone but Dr. Caryl Comes held on initiating amiodarone given relative infrequency of V tach. He changed betablocker to bisoprolol .  However he did not tolerate the bisoprolol and was switched back to his previous beta blocker which was atenolol 12.5 mg twice a day. He has been exercising regularly.  He has been walking in the park.  Yesterday he spent several hours at the zoo with his grandchildren.  Today he was helping to clean out his daughter's car and he suddenly felt dizzy.  He started walking into the house.  He tried to count his pulse but could not feel his radial pulse.  He then felt a shock of the defibrillation.  He was brought to the emergency room where interrogation shows that he had 2 episodes of ventricular tachycardia, the first which was terminated by ICD shock after tachycardia pacing failed to  convert him.  He then went back into monomorphic ventricular tachycardia and his defibrillator was getting ready to shock him again when he converted on his own.  He feels well now.  He did not have any angina or discomfort during the episodes.  He is being admitted for overnight observation and consideration of possible additional anti-rhythmic therapy  Home Medications  Prior to Admission medications   Medication Sig Start Date End Date Taking? Authorizing Provider  aspirin 81 MG tablet Take 81 mg by mouth daily.     Historical Provider, MD  atenolol (TENORMIN) 25 MG tablet Take 12.5 mg by mouth 2 (two) times daily. 1/2 tablet TWICE A DAY    Historical Provider, MD  Cholecalciferol (VITAMIN D-3 PO) Take 300 mg by mouth daily.     Historical Provider, MD  colesevelam [facility]) 625 MG tablet Take 1 tablet (625 mg total) by mouth 2 (two) times daily with a meal. 04/09/14   Darlin Coco, MD  diphenhydrAMINE (BENADRYL) 50  MG capsule Take 50 mg by mouth 1 hour prior to your procedure.CAT SCAN 12/10/13   Rosetta Posner, MD  fish oil-omega-3 fatty acids 1000 MG capsule Take 1 g by mouth daily.     Historical Provider, MD  Glucosamine Sulfate-MSM (GLUCOSAMINE-MSM DS PO) Take 1 tablet by mouth daily.    Historical Provider, MD  ibuprofen (ADVIL,MOTRIN) 200 MG tablet Take 400 mg by mouth every 8 (eight) hours as needed for pain.    Historical Provider, MD  lisinopril (PRINIVIL,ZESTRIL) 2.5 MG tablet Take 1 tablet (2.5 mg total) by mouth 2 (two) times daily. 04/02/15   Darlin Coco, MD  loratadine (CLARITIN) 10 MG tablet Take 10 mg by mouth daily.    Historical Provider, MD  magnesium oxide (MAG-OX) 400 (241.3 MG) MG tablet Take 1 tablet (400 mg total) by mouth daily. 02/11/15   Isaiah Serge, NP  Multiple Vitamin (MULTIVITAMIN WITH MINERALS) TABS Take 1 tablet by mouth daily.    Historical Provider, MD  nitroGLYCERIN (NITRODUR - DOSED IN MG/24 HR) 0.2 mg/hr patch Place 1 patch (0.2 mg total) onto the  skin daily. 03/05/15   Darlin Coco, MD  nitroGLYCERIN (NITROSTAT) 0.4 MG SL tablet Place 0.4 mg under the tongue every 5 (five) minutes as needed for chest pain (for chest pain).    Historical Provider, MD  rosuvastatin (CRESTOR) 5 MG tablet Take 5 mg by mouth every other day.    Historical Provider, MD    Family History  Family History  Problem Relation Age of Onset  . Stomach cancer    . Hyperlipidemia Mother   . Heart disease Mother   . Heart disease Father     before age 104  . Hyperlipidemia Father   . Mitral valve prolapse Sister     Social History  History   Social History  . Marital Status: Married    Spouse Name: N/A  . Number of Children: N/A  . Years of Education: N/A   Occupational History  . Retired Pharmacist, hospital    Social History Main Topics  . Smoking status: Former Smoker -- 1.00 packs/day for 20 years    Types: Cigarettes    Quit date: 12/23/1979  . Smokeless tobacco: Never Used  . Alcohol Use: No  . Drug Use: No  . Sexual Activity: Not on file   Other Topics Concern  . Not on file   Social History Narrative     Review of Systems General:  No chills, fever, night sweats or weight changes.  Cardiovascular:  No chest pain, dyspnea on exertion, edema, orthopnea, palpitations, paroxysmal nocturnal dyspnea. Dermatological: No rash, lesions/masses Respiratory: No cough, dyspnea Urologic: No hematuria, dysuria Abdominal:   No nausea, vomiting, diarrhea, bright red blood per rectum, melena, or hematemesis Neurologic:  No visual changes, wkns, changes in mental status. All other systems reviewed and are otherwise negative except as noted above.  Physical Exam  Blood pressure 129/70, pulse 65, temperature 98.1 F (36.7 C), temperature source Oral, resp. rate 15, height 5\' 9"  (1.753 m), weight 183 lb (83.008 kg), SpO2 97 %.  General: Pleasant, NAD Psych: Normal affect. Neuro: Alert and oriented X 3. Moves all extremities spontaneously. HEENT:  Normal  Neck: Supple without bruits or JVD. Lungs:  Resp regular and unlabored, CTA. Heart: RRR no s3, s4, or murmurs. Abdomen: Soft, non-tender, non-distended, BS + x 4.  Extremities: No clubbing, cyanosis or edema. DP/PT/Radials 2+ and equal bilaterally.  Labs  Troponin [facility] Test)  Recent  Labs  06/24/15 1301  TROPIPOC 0.02   No results for input(s): CKTOTAL, CKMB, TROPONINI in the last 72 hours. Lab Results  Component Value Date   WBC 5.2 06/24/2015   HGB 14.6 06/24/2015   HCT 43.7 06/24/2015   MCV 84.4 06/24/2015   PLT 126* 06/24/2015     Recent Labs Lab 06/24/15 1253  NA 139  K 4.2  CL 108  CO2 23  BUN 20  CREATININE 1.10  CALCIUM 9.2  GLUCOSE 99   Lab Results  Component Value Date   CHOL 152 02/23/2015   HDL 44.40 02/23/2015   LDLCALC 84 02/23/2015   TRIG 117.0 02/23/2015   No results found for: DDIMER   Radiology/Studies  Dg Chest 2 View  06/24/2015   CLINICAL DATA:  Defibrillator fired.  History of cardiac arrest.  EXAM: CHEST  2 VIEW  COMPARISON:  02/21/2015  FINDINGS: Again noted is a left single lead cardiac ICD. Heart size is normal. Both lungs are clear without pneumothorax. No pulmonary edema. Old right rib fractures. There is an abdominal aortic stent.  IMPRESSION: No acute chest abnormality.   Electronically Signed   By: Markus Daft M.D.   On: 06/24/2015 13:39    ECG  Normal sinus rhythm. 24-Jun-2015 12:32:51 [facility]/ED ROUTINE RECORD Sinus rhythm Prolonged PR interval Probable left atrial enlargement Probable right ventricular hypertrophy Inferolateral infarct, old No significant change since last tracing.  Personally reviewed ASSESSMENT AND PLAN 1.  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 Ischemic heart disease status post remote posterolateral myocardial infarction in 1981. Status post angioplasty and stent of LAD in 1999. 2. ICD in place following successful out of hospital ventricular  fibrillation arrest and resuscitation in 2009 3. status post stent graft surgery for abdominal aortic aneurysm December 2014 4. dyslipidemia on low-dose Crestor 5.History of hypertension. Blood pressure now normal on low-dose atenolol and lisinopril  Plan: We will admit to step down bed overnight for close observation.  His electrolytes are satisfactory.  We will continue current medication.  Consider switching from atenolol to a different beta blocker again.  In retrospect some of his symptoms from bisoprolol may have been related to borderline hypertension which improved after low-dose lisinopril was added to his regimen.  We will get serial cardiac enzymes.  We will ask EP to see.   Berna Spare MD  06/24/2015, 2:47 PM

## 2015-06-24 NOTE — ED Notes (Signed)
Pt placed in gown and in bed. Pt monitored by pulse ox, bp cuff, and 12-lead. 

## 2015-06-24 NOTE — ED Provider Notes (Signed)
CSN: [id number]     Arrival date & time 06/24/15  1228 History   First MD Initiated Contact with Patient 06/24/15 1244     Chief Complaint  Patient presents with  . Pacemaker Problem     (Consider location/radiation/quality/duration/timing/severity/associated sxs/prior Treatment) HPI   74 year old male with history of CAD, status post cardiac arrest, with ICD "St. Jude", AAA, hyperlipidemia brought here via EMS from home for evaluation of "pace maker misfiring".  Patient reported approximately an hour ago he was outside cleaning his car. States that he has been outside for nearly an hour in warm weather. He admits that he was sweaty from being outside. As he was walking he experiencing a dizziness sensation. He denies symptoms of room spinning or lightheadedness, just felt dizzy. He went inside to rest and as he was resting he experiencing a dull sensation to left side of chest similar to prior defibrillator firing. States this is the fourth time this has happened, last episode was in March. He was able to sit down and call for family member to contact EMS to bring patient to the ER. The incident lasting for less than 10 minutes and now patient is back to his normal baseline. He denies having any significant chest pain, shortness of breath, abdominal pain, back pain, focal numbness or weakness, nausea vomiting, fever or chills. He denies any recent medication changes.  He has been staying active. His cardiologist is Dr. Mare Ferrari. Patient states he received 4 baby aspirin prior to arrival. He also wears a Nitropatch daily and change it at 8 AM today.   Past Medical History  Diagnosis Date  . Coronary artery disease     myoview 2011>>EF of 53% / large area of old inferolateral infarct but no reversible ischemia          . Cardiac arrest 03/29/2008    at home without resuscitation anywhere from 7-10 minutes before EMS arrived/ The patient was noted to be in ventricular fibrillation  required  defibrillation x3               . Hypercoagulable state     Questionable history of a borderline hypercoagulable state  . Hyperlipidemia   . Anoxic brain damage      resolved  . Respiratory failure 2009    Respiratory failure and pulmonary collapse requiring intubation  . 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 PVCs (premature ventricular contractions)     occasionally  . Peripheral vascular disease   . AAA (abdominal aortic aneurysm)   . Contrast media allergy   . Myocardial infarction 1981  . Hemorrhoid   . Pacemaker   . Automatic implantable cardioverter-defibrillator in situ    Past Surgical History  Procedure Laterality Date  . Cholecystectomy, laparoscopic  08/13/2009    Calculous cholecystitis  . Insert / replace / remove pacemaker  04/07/2008      cardioverter defibrillator, St. Jude Single-chamber defibrillator implantation with intraoperative defibrillation threshold testing /  Durata 7121 dual-coil active defibrillator lead, serial  #[id number]   . Cardiac catheterization  04/04/2008     LAD stent patent, D1 OK, OM1 90/80/80% (62mm vessel), RCA 30%; EF 35%.  . Coronary angioplasty with stent placement  1999    PTCA and stenting of his left anterior descending artery  . Cholecystectomy    . Tonsillectomy    . Abdominal aortic endovascular stent graft N/A 12/06/2013    Procedure: ABDOMINAL AORTIC ENDOVASCULAR STENT GRAFT;  Surgeon: Rosetta Posner,  MD;  Location: [place];  Service: Vascular;  Laterality: N/A;  . Left heart catheterization with coronary angiogram N/A 02/28/2013    Procedure: LEFT HEART CATHETERIZATION WITH CORONARY ANGIOGRAM;  Surgeon: Sherren Mocha, MD;  Location: [facility] CATH LAB;  Service: Cardiovascular;  Laterality: N/A;   Family History  Problem Relation Age of Onset  . Stomach cancer    . Hyperlipidemia Mother   . Heart disease Mother   . Heart disease Father     before age 42  . Hyperlipidemia Father   . Mitral valve  prolapse Sister    History  Substance Use Topics  . Smoking status: Former Smoker -- 1.00 packs/day for 20 years    Types: Cigarettes    Quit date: 12/23/1979  . Smokeless tobacco: Never Used  . Alcohol Use: No    Review of Systems  All other systems reviewed and are negative.     Allergies  Losartan; Losartan potassium; Metoprolol; Mydriacyl; Phenylephrine hcl; Bisoprolol; Clopidogrel bisulfate; Epinephrine; Iodine; and Statins  Home Medications   Prior to Admission medications   Medication Sig Start Date End Date Taking? Authorizing Provider  aspirin 81 MG tablet Take 81 mg by mouth daily.     Historical Provider, MD  atenolol (TENORMIN) 25 MG tablet Take 12.5 mg by mouth 2 (two) times daily. 1/2 tablet TWICE A DAY    Historical Provider, MD  Cholecalciferol (VITAMIN D-3 PO) Take 300 mg by mouth daily.     Historical Provider, MD  colesevelam (WELCHOL) 625 MG tablet Take 1 tablet (625 mg total) by mouth 2 (two) times daily with a meal. 04/09/14   Darlin Coco, MD  diphenhydrAMINE (BENADRYL) 50 MG capsule Take 50 mg by mouth 1 hour prior to your procedure.CAT SCAN 12/10/13   Rosetta Posner, MD  fish oil-omega-3 fatty acids 1000 MG capsule Take 1 g by mouth daily.     Historical Provider, MD  Glucosamine Sulfate-MSM (GLUCOSAMINE-MSM DS PO) Take 1 tablet by mouth daily.    Historical Provider, MD  ibuprofen (ADVIL,MOTRIN) 200 MG tablet Take 400 mg by mouth every 8 (eight) hours as needed for pain.    Historical Provider, MD  lisinopril (PRINIVIL,ZESTRIL) 2.5 MG tablet Take 1 tablet (2.5 mg total) by mouth 2 (two) times daily. 04/02/15   Darlin Coco, MD  loratadine (CLARITIN) 10 MG tablet Take 10 mg by mouth daily.    Historical Provider, MD  magnesium oxide (MAG-OX) 400 (241.3 MG) MG tablet Take 1 tablet (400 mg total) by mouth daily. 02/11/15   Isaiah Serge, NP  Multiple Vitamin (MULTIVITAMIN WITH MINERALS) TABS Take 1 tablet by mouth daily.    Historical Provider, MD   nitroGLYCERIN (NITRODUR - DOSED IN MG/24 HR) 0.2 mg/hr patch Place 1 patch (0.2 mg total) onto the skin daily. 03/05/15   Darlin Coco, MD  nitroGLYCERIN (NITROSTAT) 0.4 MG SL tablet Place 0.4 mg under the tongue every 5 (five) minutes as needed for chest pain (for chest pain).    Historical Provider, MD  rosuvastatin (CRESTOR) 5 MG tablet Take 5 mg by mouth every other day.    Historical Provider, MD   BP 153/84 mmHg  Pulse 75  Temp(Src) 98.1 F (36.7 C) (Oral)  Resp 15  Ht 5\' 9"  (1.753 m)  Wt 183 lb (83.008 kg)  BMI 27.01 kg/m2  SpO2 97% Physical Exam  Constitutional: He is oriented to person, place, and time. He appears well-developed and well-nourished. No distress.  Caucasian male, appears stated age, in  no acute distress  HENT:  Head: Atraumatic.  Eyes: Conjunctivae are normal.  Neck: Neck supple.  Cardiovascular: Normal rate, regular rhythm and intact distal pulses.   Pulmonary/Chest: Effort normal and breath sounds normal.  Abdominal: Soft. There is no tenderness.  Musculoskeletal: He exhibits no edema.  Neurological: He is alert and oriented to person, place, and time.  Skin: No rash noted.  Psychiatric: He has a normal mood and affect.  Nursing note and vitals reviewed.   ED Course  Procedures (including critical care time)  Patient had an episode of dizziness, follows by sensation of the fibrillator firing. He is back to normal baseline. He has had similar firing of his defibrillator. His St. Jude defibrillator was interrogated which shows 2 series of V. tach lasting less than 30 seconds each. The remainder of his workup has been unremarkable. I have consult to cardiologist who has seen and evaluate patient and will determine further management. Care discussed with Dr. Tawnya Crook.   3:45 PM Pt has been seen and evaluated by our cardiologist.  Pt will be admitted to step down.  Will likely have his betablocker changed tomorrow.  He is currently resting, in NAD and is  aware of plan   Labs Review Labs Reviewed  CBC WITH DIFFERENTIAL/PLATELET - Abnormal; Notable for the following:    Platelets 126 (*)    Monocytes Relative 14 (*)    All other components within normal limits  BASIC METABOLIC PANEL  MAGNESIUM  PHOSPHORUS  I-STAT TROPOININ, ED    Imaging Review Dg Chest 2 View  06/24/2015   CLINICAL DATA:  Defibrillator fired.  History of cardiac arrest.  EXAM: CHEST  2 VIEW  COMPARISON:  02/21/2015  FINDINGS: Again noted is a left single lead cardiac ICD. Heart size is normal. Both lungs are clear without pneumothorax. No pulmonary edema. Old right rib fractures. There is an abdominal aortic stent.  IMPRESSION: No acute chest abnormality.   Electronically Signed   By: Markus Daft M.D.   On: 06/24/2015 13:39     EKG Interpretation   Date/Time:  Wednesday June 24 2015 12:32:51 EDT Ventricular Rate:  75 PR Interval:  242 QRS Duration: 109 QT Interval:  369 QTC Calculation: 412 R Axis:   -137 Text Interpretation:  Sinus rhythm Prolonged PR interval Probable left  atrial enlargement Probable right ventricular hypertrophy Inferolateral  infarct, old No significant change since last tracing Confirmed by  DOCHERTY  MD, [place] [phone]) on 06/24/2015 12:40:40 PM      MDM   Final diagnoses:  Ventricular tachycardia, s/p ICD shock  Dizziness    BP 130/79 mmHg  Pulse 58  Temp(Src) 98.1 F (36.7 C) (Oral)  Resp 16  Ht 5\' 9"  (1.753 m)  Wt 183 lb (83.008 kg)  BMI 27.01 kg/m2  SpO2 98%  I have reviewed nursing notes and vital signs. I personally viewed the imaging tests through PACS system and agrees with radiologist's intepretation I reviewed available ER/hospitalization records through the EMR     Domenic Moras, PA-C 06/24/15 [place], MD 06/24/15 1642

## 2015-06-25 DIAGNOSIS — Z91041 Radiographic dye allergy status: Secondary | ICD-10-CM | POA: Diagnosis not present

## 2015-06-25 DIAGNOSIS — I5022 Chronic systolic (congestive) heart failure: Secondary | ICD-10-CM | POA: Diagnosis present

## 2015-06-25 DIAGNOSIS — D6859 Other primary thrombophilia: Secondary | ICD-10-CM | POA: Diagnosis present

## 2015-06-25 DIAGNOSIS — Z7982 Long term (current) use of aspirin: Secondary | ICD-10-CM | POA: Diagnosis not present

## 2015-06-25 DIAGNOSIS — I2510 Atherosclerotic heart disease of native coronary artery without angina pectoris: Secondary | ICD-10-CM | POA: Diagnosis present

## 2015-06-25 DIAGNOSIS — R42 Dizziness and giddiness: Secondary | ICD-10-CM | POA: Diagnosis present

## 2015-06-25 DIAGNOSIS — I10 Essential (primary) hypertension: Secondary | ICD-10-CM | POA: Diagnosis present

## 2015-06-25 DIAGNOSIS — Z8674 Personal history of sudden cardiac arrest: Secondary | ICD-10-CM | POA: Diagnosis not present

## 2015-06-25 DIAGNOSIS — Z955 Presence of coronary angioplasty implant and graft: Secondary | ICD-10-CM | POA: Diagnosis not present

## 2015-06-25 DIAGNOSIS — I739 Peripheral vascular disease, unspecified: Secondary | ICD-10-CM | POA: Diagnosis present

## 2015-06-25 DIAGNOSIS — Z888 Allergy status to other drugs, medicaments and biological substances status: Secondary | ICD-10-CM | POA: Diagnosis not present

## 2015-06-25 DIAGNOSIS — Z9049 Acquired absence of other specified parts of digestive tract: Secondary | ICD-10-CM | POA: Diagnosis present

## 2015-06-25 DIAGNOSIS — I252 Old myocardial infarction: Secondary | ICD-10-CM | POA: Diagnosis not present

## 2015-06-25 DIAGNOSIS — I472 Ventricular tachycardia: Secondary | ICD-10-CM | POA: Diagnosis present

## 2015-06-25 DIAGNOSIS — Z95810 Presence of automatic (implantable) cardiac defibrillator: Secondary | ICD-10-CM | POA: Diagnosis not present

## 2015-06-25 DIAGNOSIS — E782 Mixed hyperlipidemia: Secondary | ICD-10-CM | POA: Diagnosis present

## 2015-06-25 DIAGNOSIS — Z87891 Personal history of nicotine dependence: Secondary | ICD-10-CM | POA: Diagnosis not present

## 2015-06-25 DIAGNOSIS — G931 Anoxic brain damage, not elsewhere classified: Secondary | ICD-10-CM | POA: Diagnosis present

## 2015-06-25 LAB — BASIC METABOLIC PANEL
Anion gap: 5 (ref 5–15)
BUN: 17 mg/dL (ref 6–20)
CO2: 28 mmol/L (ref 22–32)
CREATININE: 1.12 mg/dL (ref 0.61–1.24)
Calcium: 9 mg/dL (ref 8.9–10.3)
Chloride: 104 mmol/L (ref 101–111)
GFR calc Af Amer: >60 mL/min (ref >60)
GFR calc non Af Amer: >60 mL/min (ref >60)
GLUCOSE: 100 mg/dL — AB (ref 65–99)
Potassium: 4 mmol/L (ref 3.5–5.1)
SODIUM: 137 mmol/L (ref 135–145)

## 2015-06-25 LAB — TROPONIN I
TROPONIN I: 0.04 ng/mL — AB (ref <0.031)
Troponin I: 0.03 ng/mL (ref <0.031)

## 2015-06-25 MED ORDER — SOTALOL HCL 120 MG PO TABS
120.0000 mg | ORAL_TABLET | Freq: Two times a day (BID) | ORAL | Status: DC
  Administered 2015-06-26: 120 mg via ORAL
  Filled 2015-06-25 (×2): qty 1

## 2015-06-25 MED ORDER — SOTALOL HCL 80 MG PO TABS
80.0000 mg | ORAL_TABLET | Freq: Once | ORAL | Status: AC
  Administered 2015-06-25: 80 mg via ORAL
  Filled 2015-06-25: qty 1

## 2015-06-25 NOTE — Progress Notes (Signed)
Subjective:    Pt admitted overnight with ICD shock yesterday after he felt dizzy while cleaning out his daughter's car.  Interrogation showed 2 episodes of VT, terminated by ICD.  Briefly, he has h/o remote posterolateral MI 1981, stenting of LAD in 1999.  March 2014 had 3 shocks with subsequent LHC which did not show any new lesions.    VSS.  No overnight events.  Pt is feeling good  No CP  No SOB     Objective:   Temp:  [97.5 F (36.4 C)-98.1 F (36.7 C)] 97.6 F (36.4 C) (07/14 0500) Pulse Rate:  [50-75] 50 (07/14 0500) Resp:  [12-24] 16 (07/14 0500) BP: (112-153)/(55-84) 134/64 mmHg (07/14 0500) SpO2:  [92 %-98 %] 94 % (07/14 0500) Weight:  [83.008 kg (183 lb)] 83.008 kg (183 lb) (07/13 1234) Last BM Date: 06/24/15  Filed Weights   06/24/15 1234  Weight: 83.008 kg (183 lb)    Intake/Output Summary (Last 24 hours) at 06/25/15 0610 Last data filed at 06/24/15 2100  Gross per 24 hour  Intake    720 ml  Output    200 ml  Net    520 ml    Telemetry:   Physical Exam: General: NAD. HEENT: Conjunctiva and lids normal, oropharynx clear. Lungs: CTAB, nonlabored. Cardiac: RRR, no m/r/g. Abdomen: +BS, NT/ND.   Extremities: No LE edema. Radial cath site without hematoma. Neuro: Alert and oriented x3. Moving all extremities.   Lab Results:  Basic Metabolic Panel:  Recent Labs Lab 06/24/15 1253 06/25/15 0058  NA 139 137  K 4.2 4.0  CL 108 104  CO2 23 28  GLUCOSE 99 100*  BUN 20 17  CREATININE 1.10 1.12  CALCIUM 9.2 9.0  MG 2.2  --     Liver Function Tests: No results for input(s): AST, ALT, ALKPHOS, BILITOT, PROT, ALBUMIN in the last 168 hours.  CBC:  Recent Labs Lab 06/24/15 1253  WBC 5.2  HGB 14.6  HCT 43.7  MCV 84.4  PLT 126*    Cardiac Enzymes:  Recent Labs Lab 06/24/15 1902 06/25/15 0058  TROPONINI 0.05* 0.04*    BNP: No results for input(s): PROBNP in the last 8760 hours.  Coagulation: No results for input(s): INR in the  last 168 hours.  Radiology: Dg Chest 2 View  06/24/2015   CLINICAL DATA:  Defibrillator fired.  History of cardiac arrest.  EXAM: CHEST  2 VIEW  COMPARISON:  02/21/2015  FINDINGS: Again noted is a left single lead cardiac ICD. Heart size is normal. Both lungs are clear without pneumothorax. No pulmonary edema. Old right rib fractures. There is an abdominal aortic stent.  IMPRESSION: No acute chest abnormality.   Electronically Signed   By: Markus Daft M.D.   On: 06/24/2015 13:39     ECG:   Medications:   Scheduled Medications: . aspirin  324 mg Oral NOW   Or  . aspirin  300 mg Rectal NOW  . aspirin EC  81 mg Oral Daily  . atenolol  12.5 mg Oral BID  . colesevelam  625 mg Oral BID WC  . heparin  5,000 Units Subcutaneous 3 times per day  . lisinopril  2.5 mg Oral BID  . loratadine  10 mg Oral Daily  . magnesium oxide  400 mg Oral Daily  . multivitamin with minerals  1 tablet Oral Daily  . nitroGLYCERIN  0.2 mg Transdermal Daily  . omega-3 acid ethyl esters  1 g Oral Daily  .  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  He is followed by Dr. Caryl Comes.  Trops mildly elevated at 0.04.  No EKG changes.  No CP.    -consulted EP  -trend trops  NO evid for active ischemia    CAD Pt on atenolol, lisinopril, ASA, crestor/lovaza.  Was on metoprolol previously but was switched to atenolol d/t side effects? -cont current meds, consider switching BB to metoprolol, bisoprolol, carvedilol  HTN -cont current meds  Dyslipidemia -cont current meds   Jones Bales, MD PGY-3, Internal Medicine Teaching Service 06/25/2015, 6:10 AM   Patient seen and examined.  I have reviewed with Dr Lovena Le from EP   Recomm stopping atenolol and starting sotalol   80 mg  tonight the 120 bid Watch BP  May need to stop lisinopril    Dorris Carnes

## 2015-06-25 NOTE — Care Management Note (Signed)
Case Management Note  Patient Details  Name: Joel Lin MRN: [id number] Date of Birth: [birth year]  Subjective/Objective:       Pt adm w vtach,s/p icd shock             Action/Plan: lives w fam, pcp dr Chriss Czar polite   Expected Discharge Date:                  Expected Discharge Plan:  Home/Self Care  In-House Referral:     Discharge planning Services     Post Acute Care Choice:    Choice offered to:     DME Arranged:    DME Agency:     HH Arranged:    [place] Agency:     Status of Service:     Medicare Important Message Given:    Date Medicare IM Given:    Medicare IM give by:    Date Additional Medicare IM Given:    Additional Medicare Important Message give by:     If discussed at [place] of Stay Meetings, dates discussed:    Additional Comments: indep pta  Lacretia Leigh, RN 06/25/2015, 2:00 PM

## 2015-06-26 MED ORDER — LISINOPRIL 2.5 MG PO TABS: 1.2500 mg | ORAL_TABLET | Freq: Two times a day (BID) | ORAL | Status: DC

## 2015-06-26 MED ORDER — LISINOPRIL 2.5 MG PO TABS: 1.2500 mg | ORAL_TABLET | Freq: Every day | ORAL | Status: DC

## 2015-06-26 MED ORDER — SOTALOL HCL 120 MG PO TABS: 120.0000 mg | ORAL_TABLET | Freq: Two times a day (BID) | ORAL | Status: DC

## 2015-06-26 MED ORDER — LISINOPRIL 2.5 MG PO TABS
1.2500 mg | ORAL_TABLET | Freq: Two times a day (BID) | ORAL | Status: DC
  Administered 2015-06-26: 1.25 mg via ORAL
  Filled 2015-06-26 (×2): qty 0.5

## 2015-06-26 NOTE — Progress Notes (Signed)
Subjective:    Pt admitted with ICD shock after he felt dizzy while cleaning out his daughter's car.  Interrogation showed 2 episodes of VT, terminated by ICD.  Briefly, he has h/o remote posterolateral MI 1981, stenting of LAD in 1999.  March 2014 had 3 shocks with subsequent LHC which did not show any new lesions.    VSS.  No overnight events.  Pt is feeling good.  No or SOB.  Would like to decrease lisinopril to 0.5 tab of 2.5mg .   Objective:   Temp:  [97.5 F (36.4 C)-98.1 F (36.7 C)] 97.5 F (36.4 C) (07/15 0732) Pulse Rate:  [51-61] 54 (07/15 0800) Resp:  [16-18] 16 (07/15 0732) BP: (102-139)/(58-72) 120/58 mmHg (07/15 0732) SpO2:  [93 %-97 %] 94 % (07/15 0732) Last BM Date: 06/25/15  Filed Weights   06/24/15 1234  Weight: 83.008 kg (183 lb)    Intake/Output Summary (Last 24 hours) at 06/26/15 1004 Last data filed at 06/26/15 0900  Gross per 24 hour  Intake   1620 ml  Output      0 ml  Net   1620 ml    Telemetry: NSR  Physical Exam: General: NAD. HEENT: Conjunctiva and lids normal, oropharynx clear. Lungs: CTAB, nonlabored. Cardiac: RRR, no m/r/g. Abdomen: +BS, NT/ND.   Extremities: No LE edema.  Neuro: Alert and oriented x3. Moving all extremities.   Lab Results:  Basic Metabolic Panel:  Recent Labs Lab 06/24/15 1253 06/25/15 0058  NA 139 137  K 4.2 4.0  CL 108 104  CO2 23 28  GLUCOSE 99 100*  BUN 20 17  CREATININE 1.10 1.12  CALCIUM 9.2 9.0  MG 2.2  --     Liver Function Tests: No results for input(s): AST, ALT, ALKPHOS, BILITOT, PROT, ALBUMIN in the last 168 hours.  CBC:  Recent Labs Lab 06/24/15 1253  WBC 5.2  HGB 14.6  HCT 43.7  MCV 84.4  PLT 126*    Cardiac Enzymes:  Recent Labs Lab 06/24/15 1902 06/25/15 0058 06/25/15 0650  TROPONINI 0.05* 0.04* 0.03    BNP: No results for input(s): PROBNP in the last 8760 hours.  Coagulation: No results for input(s): INR in the last 168 hours.  Radiology: Dg Chest 2  View  06/24/2015   CLINICAL DATA:  Defibrillator fired.  History of cardiac arrest.  EXAM: CHEST  2 VIEW  COMPARISON:  02/21/2015  FINDINGS: Again noted is a left single lead cardiac ICD. Heart size is normal. Both lungs are clear without pneumothorax. No pulmonary edema. Old right rib fractures. There is an abdominal aortic stent.  IMPRESSION: No acute chest abnormality.   Electronically Signed   By: Markus Daft M.D.   On: 06/24/2015 13:39     ECG:   Medications:   Scheduled Medications: . aspirin EC  81 mg Oral Daily  . colesevelam  625 mg Oral BID WC  . heparin  5,000 Units Subcutaneous 3 times per day  . lisinopril  1.25 mg Oral BID  . loratadine  10 mg Oral Daily  . magnesium oxide  400 mg Oral Daily  . multivitamin with minerals  1 tablet Oral Daily  . nitroGLYCERIN  0.2 mg Transdermal Daily  . omega-3 acid ethyl esters  1 g Oral Daily  . rosuvastatin  5 mg Oral Q48H  . 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  He is followed by Dr. Caryl Comes.  Trops mildly elevated at 0.04 but trended down.   No EKG changes.  No CP.  Dr Lovena Le from EP recommends d/c atenolol and added sotalol which was started last PM (80mg  then 120mg  bid).   -cont sotalol 120mg  bid -ambulate today -d/c today   CAD No evid for active ischemia  Pt on atenolol, lisinopril, ASA, crestor/lovaza for home meds.   -cont current meds but d/c atenolol and added sotalol per EP recs   HTN Stable.  -decrease lisinopril to 1.25mg  bid.    Follow BP and increase if tolerates  Dyslipidemia -cont current meds   Jones Bales, MD PGY-3, Internal Medicine Teaching Service 06/26/2015, 10:04 AM    Pt seen and examined  I have amended note to reflect my findings.   Plan for d/c with  outpt f/u.  Dorris Carnes

## 2015-06-26 NOTE — Discharge Summary (Signed)
Physician Discharge Summary  Patient ID: Joel Lin MRN: [id number] DOB/AGE: [birth year] 74 y.o.   Primary Cardiologist: Dr. Mare Ferrari Electrophysiologist: Dr. Caryl Comes  Admit date: 06/24/2015 Discharge date: 06/26/2015  Admission Diagnoses: Ventricular Tachycardia, s/p ICD shock  Discharge Diagnoses:  Principal Problem:   Ventricular tachycardia, s/p ICD shock Active Problems:   HYPERLIPIDEMIA, MIXED   Coronary atherosclerosis   Paroxysmal ventricular tachycardia   VT (ventricular tachycardia)   Discharged Condition: stable  Cardiac History: The patient is a 74 year old gentleman, followed by Dr. Mare Ferrari and Dr. Caryl Comes with a history of known ischemic heart disease. He has a history of a remote myocardial infarction at the age of 74. That was a large posterolateral myocardial infarction in 1981. The patient had angioplasty and stent of his LAD in 1999. In 2009 he had an out of hospital cardiac arrest at home and was successfully resuscitated and treated with hypothermia without significant neurologic sequelae. He now has a defibrillator in place that is followed by Dr. Caryl Comes. In January 2013 he had 2 episodes of ventricular tachycardia which were asymptomatic. In March 2014 he was rehospitalized after having 3 consecutive shocks from his defibrillator. He underwent cardiac catheterization by Dr. Burt Knack on 02/28/13 who told him that his arteries have not shown any progression of atherosclerosis since his previous cardiac catheterization in 2009. Once again, on  02/10/72, the patient presented to [facility] ED due to an ICD shock.Pt was transferred to [facility] for EP eval. He did well overnight without any recurrent ICD shocks. Troponin was negative. He was seen by Dr. Caryl Comes and found stable for discharge. He had been placed on PO amiodarone but Dr. Caryl Comes held on initiating amiodarone given relative infrequency of V tach. He changed his betablocker to bisoprolol . However he did not  tolerate the bisoprolol and was switched back to his previous beta blocker which was atenolol 12.5 mg twice a day. His history is also notable for AAA s/p stent graft 12/06/13, HLD and chronic systolic CHF. His last 2D echo 12/2012 showed EF of 40%.     HPI: Patient presented to [facility] on 06/24/15 after an ICD shock at home. He was helping to clean out his daughter's car and he suddenly felt dizzy. He started walking into the house. He tried to count his pulse but could not feel his radial pulse. He then felt a defibrillator shock. He was brought to the emergency room where interrogation showed that he had 2 episodes of ventricular tachycardia, the first which was terminated by ICD shock after tachycardia pacing failed to convert him. He then went back into monomorphic ventricular tachycardia and his defibrillator was getting ready to shock him again when he converted on his own. He denied any angina or discomfort during the episodes.  Patient was admitted to stepdown for observation. Lab work was obtained revealing satisfactory electrolytes. Cardiac enzymes were cycled x 3. 2/3 returned positive but with flat/downward trend at 0.05, 0.04 and 0.03. There were no EKG changes and no evidence for active ischemia. EP was consulted for recommendations. Dr. Lovena Le recommended discontinuation of atenolol and initiation of sotalol, first dose of 80 mg, followed by 120 mg BID. He tolerated this well. No further VT or ICD shocks. BP remained stable but decision was made to reduce his lisinopril dose in half to 1.25 mg to avoid hypotension. The patient was last seen and examined by Dr. Harrington Challenger, who felt he was stable for discharge home. Post hospital f/u has been arranged with Dr. Caryl Comes  on 07/07/15.   Consults: None  Significant Diagnostic Studies:   Treatments: See Hospital Course  Discharge Exam: Blood pressure 119/60, pulse 48, temperature 97.7 F (36.5 C), temperature source Oral, resp. rate 16, height 5\' 9"   (1.753 m), weight 183 lb (83.008 kg), SpO2 95 %.   Disposition: 01-Home or Self Care      Discharge Instructions    Diet - low sodium heart healthy    Complete by:  As directed      Increase activity slowly    Complete by:  As directed             Medication List    STOP taking these medications        atenolol 25 MG tablet  Commonly known as:  TENORMIN      TAKE these medications        aspirin 81 MG tablet  Take 81 mg by mouth daily.     colesevelam 625 MG tablet  Commonly known as:  WELCHOL  Take 1 tablet (625 mg total) by mouth 2 (two) times daily with a meal.     fish oil-omega-3 fatty acids 1000 MG capsule  Take 1 g by mouth daily.     GLUCOSAMINE-MSM DS PO  Take 1 tablet by mouth daily.     lisinopril 2.5 MG tablet  Commonly known as:  PRINIVIL,ZESTRIL  Take 0.5 tablets (1.25 mg total) by mouth 2 (two) times daily.     loratadine 10 MG tablet  Commonly known as:  CLARITIN  Take 10 mg by mouth daily.     magnesium oxide 400 (241.3 MG) MG tablet  Commonly known as:  MAG-OX  Take 1 tablet (400 mg total) by mouth daily.     multivitamin with minerals Tabs tablet  Take 1 tablet by mouth daily.     nitroGLYCERIN 0.4 MG SL tablet  Commonly known as:  NITROSTAT  Place 0.4 mg under the tongue every 5 (five) minutes as needed for chest pain (for chest pain).     nitroGLYCERIN 0.2 mg/hr patch  Commonly known as:  NITRODUR - Dosed in mg/24 hr  Place 1 patch (0.2 mg total) onto the skin daily.     rosuvastatin 5 MG tablet  Commonly known as:  CRESTOR  Take 5 mg by mouth every other day.     sotalol 120 MG tablet  Commonly known as:  BETAPACE  Take 1 tablet (120 mg total) by mouth every 12 (twelve) hours.     VITAMIN D-3 PO  Take 300 mg by mouth daily.       Follow-up Information    Follow up with Virl Axe, MD On 07/07/2015.   Specialty:  Cardiology   Why:  at 4:15PM   Contact information:   [address]  [address] [phone]       TIME SPENT ON DISCHARGE, INCLUDING PHYSICIAN TIME: >30 MINUTES   Signed: Lyda Jester 06/26/2015, 12:37 PM

## 2015-07-02 ENCOUNTER — Ambulatory Visit: Payer: Medicare Other | Admitting: *Deleted

## 2015-07-02 DIAGNOSIS — I469 Cardiac arrest, cause unspecified: Secondary | ICD-10-CM

## 2015-07-02 NOTE — Progress Notes (Signed)
Remote ICD transmission.   

## 2015-07-07 ENCOUNTER — Ambulatory Visit (INDEPENDENT_AMBULATORY_CARE_PROVIDER_SITE_OTHER): Payer: Medicare Other | Admitting: Internal Medicine

## 2015-07-07 ENCOUNTER — Encounter: Payer: Self-pay | Admitting: Internal Medicine

## 2015-07-07 VITALS — BP 120/70 | HR 55 | Ht 69.0 in | Wt 182.4 lb

## 2015-07-07 DIAGNOSIS — I4729 Other ventricular tachycardia: Secondary | ICD-10-CM

## 2015-07-07 DIAGNOSIS — I469 Cardiac arrest, cause unspecified: Secondary | ICD-10-CM

## 2015-07-07 DIAGNOSIS — I255 Ischemic cardiomyopathy: Secondary | ICD-10-CM | POA: Diagnosis not present

## 2015-07-07 DIAGNOSIS — I472 Ventricular tachycardia: Secondary | ICD-10-CM | POA: Diagnosis not present

## 2015-07-07 DIAGNOSIS — Z4502 Encounter for adjustment and management of automatic implantable cardiac defibrillator: Secondary | ICD-10-CM

## 2015-07-07 LAB — CUP PACEART INCLINIC DEVICE CHECK
Battery Voltage: 2.54 V
Date Time Interrogation Session: 20160726165442
HighPow Impedance: 50 Ohm
Lead Channel Impedance Value: 300 Ohm
Lead Channel Pacing Threshold Amplitude: 1.25 V
Lead Channel Pacing Threshold Pulse Width: 0.6 ms
Lead Channel Setting Pacing Amplitude: 2.5 V
Lead Channel Setting Sensing Sensitivity: 0.3 mV
MDC IDC MSMT BATTERY REMAINING LONGEVITY: 19.2 mo
MDC IDC MSMT LEADCHNL RV SENSING INTR AMPL: 9.5 mV
MDC IDC SET LEADCHNL RV PACING PULSEWIDTH: 0.6 ms
MDC IDC STAT BRADY RV PERCENT PACED: 0.2 %
Pulse Gen Serial Number: 549899
Zone Setting Detection Interval: 250 ms
Zone Setting Detection Interval: 280 ms
Zone Setting Detection Interval: 330 ms

## 2015-07-07 NOTE — Patient Instructions (Signed)
Medication Instructions:  Your physician recommends that you continue on your current medications as directed. Please refer to the Current Medication list given to you today.  Labwork: None ordered  Testing/Procedures: None ordered  Follow-Up: Your physician recommends that you schedule a follow-up appointment in: 3 months with Chanetta Marshall, NP.  Your physician wants you to follow-up in: 6 months with Dr. Caryl Comes. You will receive a reminder letter in the mail two months in advance. If you don't receive a letter, please call our office to schedule the follow-up appointment.  Any Other Special Instructions Will Be Listed Below (If Applicable). Thank you for choosing [facility]!!

## 2015-07-07 NOTE — Progress Notes (Signed)
Patient Care Team: [name], MD as PCP - General (Internal Medicine) Darlin Coco, MD as Attending Physician (Cardiology) Deboraha Sprang, MD as Attending Physician (Cardiology)   HPI  Joel Lin is a 74 y.o. male is seen in followup for aborted sudden cardiac death in the setting of ischemic heart disease with  prior PCI and mild depression of LV systolic function. He is status post ICD implantation.    he was hospitalized 7/16; these records were reviewed. He had ventricular tachycardia failed terminated with ATP and for which he was shocked. A second episode of monomorphic ventricular tachycardia terminated on its own. He was started on sotalol and his atenolol was discontinued.  The patient denies chest pain, shortness of breath, nocturnal dyspnea, orthopnea or peripheral edema. There have been no palpitations, lightheadedness or syncope.    There has been Myoview scan in May 13; ejection fraction had improved to 53%; previous IMI evident. Recent echo was corroborating   He underwent stent grafting of the abdominal aortic aneurysm  He has a history of prior inappropriate ICD discharges secondary to sinus tachycardia  He also has in 2013 ventricular tachycardia terminated with antitachycardia pacing;  this was unassociated with symptoms and was treated with increased dose of beta blockers      Past Medical History  Diagnosis Date  . Coronary artery disease     myoview 2011>>EF of 53% / large area of old inferolateral infarct but no reversible ischemia          . Cardiac arrest 03/29/2008    at home without resuscitation anywhere from 7-10 minutes before EMS arrived/ The patient was noted to be in ventricular fibrillation  required defibrillation x3               . Hypercoagulable state     Questionable history of a borderline hypercoagulable state  . Hyperlipidemia   . Anoxic brain damage      resolved  . Respiratory failure 2009    Respiratory  failure and pulmonary collapse requiring intubation  . 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 PVCs (premature ventricular contractions)     occasionally  . Peripheral vascular disease   . AAA (abdominal aortic aneurysm)   . Contrast media allergy   . Myocardial infarction 1981  . Hemorrhoid   . Pacemaker   . Automatic implantable cardioverter-defibrillator in situ     Past Surgical History  Procedure Laterality Date  . Cholecystectomy, laparoscopic  08/13/2009    Calculous cholecystitis  . Insert / replace / remove pacemaker  04/07/2008      cardioverter defibrillator, [employer] Single-chamber defibrillator implantation with intraoperative defibrillation threshold testing /  Durata 7121 dual-coil active defibrillator lead, serial  #[id number]   . Cardiac catheterization  04/04/2008     LAD stent patent, D1 OK, OM1 90/80/80% (1mm vessel), RCA 30%; EF 35%.  . Coronary angioplasty with stent placement  1999    PTCA and stenting of his left anterior descending artery  . Cholecystectomy    . Tonsillectomy    . Abdominal aortic endovascular stent graft N/A 12/06/2013    Procedure: ABDOMINAL AORTIC ENDOVASCULAR STENT GRAFT;  Surgeon: Rosetta Posner, MD;  Location: [facility];  Service: Vascular;  Laterality: N/A;  . Left heart catheterization with coronary angiogram N/A 02/28/2013    Procedure: LEFT HEART CATHETERIZATION WITH CORONARY ANGIOGRAM;  Surgeon: Sherren Mocha, MD;  Location: [facility] CATH LAB;  Service: Cardiovascular;  Laterality:  N/A;    Current Outpatient Prescriptions  Medication Sig Dispense Refill  . aspirin 81 MG tablet Take 81 mg by mouth daily.     . Cholecalciferol (VITAMIN D-3 PO) Take 300 mg by mouth daily.     . colesevelam (WELCHOL) 625 MG tablet Take 1 tablet (625 mg total) by mouth 2 (two) times daily with a meal. 180 tablet 2  . fish oil-omega-3 fatty acids 1000 MG capsule Take 1 g by mouth daily.     . Glucosamine Sulfate-MSM (GLUCOSAMINE-MSM  DS PO) Take 1 tablet by mouth daily.    Marland Kitchen lisinopril (PRINIVIL,ZESTRIL) 2.5 MG tablet Take 0.5 tablets (1.25 mg total) by mouth 2 (two) times daily. 180 tablet 3  . loratadine (CLARITIN) 10 MG tablet Take 10 mg by mouth daily.    . magnesium oxide (MAG-OX) 400 (241.3 MG) MG tablet Take 1 tablet (400 mg total) by mouth daily. 30 tablet 6  . Multiple Vitamin (MULTIVITAMIN WITH MINERALS) TABS Take 1 tablet by mouth daily.    . nitroGLYCERIN (NITRODUR - DOSED IN MG/24 HR) 0.2 mg/hr patch Place 1 patch (0.2 mg total) onto the skin daily. 90 patch 3  . nitroGLYCERIN (NITROSTAT) 0.4 MG SL tablet Place 0.4 mg under the tongue every 5 (five) minutes as needed for chest pain (for chest pain).    . rosuvastatin (CRESTOR) 5 MG tablet Take 5 mg by mouth every other day.    . sotalol (BETAPACE) 120 MG tablet Take 1 tablet (120 mg total) by mouth every 12 (twelve) hours. 180 tablet 3   No current facility-administered medications for this visit.    Allergies  Allergen Reactions  . Epinephrine Other (See Comments)    Elevated heartbeat  . Losartan Palpitations and Other (See Comments)    Irregular heartbeat  . Metoprolol Other (See Comments)    Irregular heartbeat  . Mydriacyl [Tropicamide] Other (See Comments)    Elevated heartbeat  . Phenylephrine Hcl Other (See Comments)    Increased heartrate  . Bisoprolol Other (See Comments)    Feels it does not agree with him  . Clopidogrel Bisulfate Hives       . Iodine Hives    "Renografin"  . Statins Other (See Comments)    Muscle cramps (crestor low dose is ok)    Review of Systems negative except from HPI and PMH  Physical Exam BP 120/70 mmHg  Pulse 55  Ht 5\' 9"  (1.753 m)  Wt 182 lb 6.4 oz (82.736 kg)  BMI 26.92 kg/m2 Well developed and well nourished in no acute distress HENT normal E scleral and icterus clear Neck Supple JVP flat; carotids brisk and full Clear to ausculation Device pocket well healed; without hematoma or erythema.   There is no tethering Regular rate and rhythm, no murmurs gallops or rub Soft with active bowel sounds No clubbing cyanosis none Edema Alert and oriented, grossly normal motor and sensory function Skin Warm and Dry   ECG sinus rhythm at 57 Intervals 23/11/42 Otherwise normal   Assessment and  Plan  Aborted cardiac arrest  Ischemic Cardiomyopathy  Ventricular tachycardia  Implantable defibrillator The patient's device was interrogated.  The information was reviewed. No changes were made in the programming.     He is stable Without symptoms of ischemia  .No intercurrent Ventricular tachycardia  Will continue sotalol for VT  And no driving  Functional status

## 2015-07-18 NOTE — Progress Notes (Signed)
Pt seen in office 07/07/15.

## 2015-07-21 ENCOUNTER — Ambulatory Visit: Payer: Medicare Other | Admitting: Vascular Surgery

## 2015-07-21 ENCOUNTER — Other Ambulatory Visit (HOSPITAL_COMMUNITY): Payer: Medicare Other

## 2015-07-21 ENCOUNTER — Ambulatory Visit (HOSPITAL_COMMUNITY)
Admission: RE | Admit: 2015-07-21 | Discharge: 2015-07-21 | Disposition: A | Discharge status: Home / Self Care | Payer: Medicare Other | Source: Ambulatory Visit | Attending: Vascular Surgery | Admitting: Vascular Surgery

## 2015-07-21 DIAGNOSIS — I714 Abdominal aortic aneurysm, without rupture, unspecified: Secondary | ICD-10-CM

## 2015-07-21 DIAGNOSIS — Z95828 Presence of other vascular implants and grafts: Secondary | ICD-10-CM | POA: Diagnosis not present

## 2015-07-21 DIAGNOSIS — Z48812 Encounter for surgical aftercare following surgery on the circulatory system: Secondary | ICD-10-CM | POA: Diagnosis not present

## 2015-07-27 ENCOUNTER — Encounter: Payer: Self-pay | Admitting: Vascular Surgery

## 2015-07-28 ENCOUNTER — Encounter: Payer: Self-pay | Admitting: Vascular Surgery

## 2015-07-28 ENCOUNTER — Ambulatory Visit (INDEPENDENT_AMBULATORY_CARE_PROVIDER_SITE_OTHER): Payer: Medicare Other | Admitting: Vascular Surgery

## 2015-07-28 VITALS — BP 139/82 | HR 57 | Temp 98.4°F | Resp 18 | Ht 69.0 in | Wt 185.0 lb

## 2015-07-28 DIAGNOSIS — I714 Abdominal aortic aneurysm, without rupture, unspecified: Secondary | ICD-10-CM

## 2015-07-28 NOTE — Progress Notes (Signed)
Filed Vitals:   07/28/15 1037 07/28/15 1038  BP: 145/83 139/82  Pulse: 55 57  Temp: 98.4 F (36.9 C)   Resp: 18   Height: 5\' 9"  (1.753 m)   Weight: 185 lb (83.915 kg)   SpO2: 100%

## 2015-07-28 NOTE — Progress Notes (Signed)
Patient presents today for follow-up of stent graft repair of infrarenal abdominal aortic aneurysm by myself on 12/06/2013. He looks quite good today. He is here today with his wife. He does report that his defibrillator fired for V. tach in March 2016 in July 2016. He has had some adjustment in his medical treatment. He has no symptoms referable to his aneurysm  Past Medical History  Diagnosis Date  . Coronary artery disease     myoview 2011>>EF of 53% / large area of old inferolateral infarct but no reversible ischemia          . Cardiac arrest 03/29/2008    at home without resuscitation anywhere from 7-10 minutes before EMS arrived/ The patient was noted to be in ventricular fibrillation  required defibrillation x3               . Hypercoagulable state     Questionable history of a borderline hypercoagulable state  . Hyperlipidemia   . Anoxic brain damage      resolved  . Respiratory failure 2009    Respiratory failure and pulmonary collapse requiring intubation  . 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 PVCs (premature ventricular contractions)     occasionally  . Peripheral vascular disease   . AAA (abdominal aortic aneurysm)   . Contrast media allergy   . Myocardial infarction 1981  . Hemorrhoid   . Pacemaker   . Automatic implantable cardioverter-defibrillator in situ     Social History  Substance Use Topics  . Smoking status: Former Smoker -- 1.00 packs/day for 20 years    Types: Cigarettes    Quit date: 12/23/1979  . Smokeless tobacco: Never Used  . Alcohol Use: No    Family History  Problem Relation Age of Onset  . Stomach cancer    . Hyperlipidemia Mother   . Heart disease Mother   . Heart disease Father     before age 103  . Hyperlipidemia Father   . Mitral valve prolapse Sister     Allergies  Allergen Reactions  . Epinephrine Other (See Comments)    Elevated heartbeat  . Losartan Palpitations and Other (See Comments)   Irregular heartbeat  . Metoprolol Other (See Comments)    Irregular heartbeat  . Mydriacyl [Tropicamide] Other (See Comments)    Elevated heartbeat  . Phenylephrine Hcl Other (See Comments)    Increased heartrate  . Bisoprolol Other (See Comments)    Feels it does not agree with him  . Clopidogrel Bisulfate Hives       . Iodine Hives    "Renografin"  . Statins Other (See Comments)    Muscle cramps (crestor low dose is ok)     Current outpatient prescriptions:  .  aspirin 81 MG tablet, Take 81 mg by mouth daily. , Disp: , Rfl:  .  Cholecalciferol (VITAMIN D-3 PO), Take 300 mg by mouth daily. , Disp: , Rfl:  .  colesevelam (WELCHOL) 625 MG tablet, Take 1 tablet (625 mg total) by mouth 2 (two) times daily with a meal., Disp: 180 tablet, Rfl: 2 .  fish oil-omega-3 fatty acids 1000 MG capsule, Take 1 g by mouth daily. , Disp: , Rfl:  .  Glucosamine Sulfate-MSM (GLUCOSAMINE-MSM DS PO), Take 1 tablet by mouth daily., Disp: , Rfl:  .  lisinopril (PRINIVIL,ZESTRIL) 2.5 MG tablet, Take 0.5 tablets (1.25 mg total) by mouth 2 (two) times daily., Disp: 180 tablet, Rfl: 3 .  loratadine (CLARITIN) 10 MG tablet,  Take 10 mg by mouth daily., Disp: , Rfl:  .  magnesium oxide (MAG-OX) 400 (241.3 MG) MG tablet, Take 1 tablet (400 mg total) by mouth daily., Disp: 30 tablet, Rfl: 6 .  Multiple Vitamin (MULTIVITAMIN WITH MINERALS) TABS, Take 1 tablet by mouth daily., Disp: , Rfl:  .  nitroGLYCERIN (NITRODUR - DOSED IN MG/24 HR) 0.2 mg/hr patch, Place 1 patch (0.2 mg total) onto the skin daily., Disp: 90 patch, Rfl: 3 .  nitroGLYCERIN (NITROSTAT) 0.4 MG SL tablet, Place 0.4 mg under the tongue every 5 (five) minutes as needed for chest pain (for chest pain)., Disp: , Rfl:  .  rosuvastatin (CRESTOR) 5 MG tablet, Take 5 mg by mouth every other day., Disp: , Rfl:  .  sotalol (BETAPACE) 120 MG tablet, Take 1 tablet (120 mg total) by mouth every 12 (twelve) hours., Disp: 180 tablet, Rfl: 3  Filed Vitals:    07/28/15 1037 07/28/15 1038  BP: 145/83 139/82  Pulse: 55 57  Temp: 98.4 F (36.9 C)   Resp: 18   Height: 5\' 9"  (1.753 m)   Weight: 185 lb (83.915 kg)   SpO2: 100%     Body mass index is 27.31 kg/(m^2).       On physical exam: His abdomen is soft nontender. I do not feel pulsatile mass in his abdomen His femoral and dorsalis pedis pulses are 2+ bilaterally  He did undergo duplex of his stent graft in our office on 07/20/2015 and I reviewed these results with him. This shows a diminished size of his maximal diameter down to 4.8 cm from 5.1 cm.  Impression and plan stable stent graft repair of infrarenal abdominal aortic aneurysm. We'll see him again in one year with repeat ultrasound of his stent graft repair

## 2015-07-30 ENCOUNTER — Encounter: Payer: Self-pay | Admitting: Cardiology

## 2015-08-12 ENCOUNTER — Other Ambulatory Visit (INDEPENDENT_AMBULATORY_CARE_PROVIDER_SITE_OTHER): Payer: Medicare Other | Admitting: *Deleted

## 2015-08-12 DIAGNOSIS — Z79899 Other long term (current) drug therapy: Secondary | ICD-10-CM

## 2015-08-12 LAB — CBC WITH DIFFERENTIAL/PLATELET
BASOS PCT: 0.7 % (ref 0.0–3.0)
Basophils Absolute: 0 10*3/uL (ref 0.0–0.1)
EOS PCT: 5 % (ref 0.0–5.0)
Eosinophils Absolute: 0.3 10*3/uL (ref 0.0–0.7)
HCT: 45.9 % (ref 39.0–52.0)
Hemoglobin: 15.1 g/dL (ref 13.0–17.0)
LYMPHS ABS: 1.7 10*3/uL (ref 0.7–4.0)
Lymphocytes Relative: 28.9 % (ref 12.0–46.0)
MCHC: 33 g/dL (ref 30.0–36.0)
MCV: 84.4 fl (ref 78.0–100.0)
MONO ABS: 1.1 10*3/uL — AB (ref 0.1–1.0)
Monocytes Relative: 18.4 % — ABNORMAL HIGH (ref 3.0–12.0)
NEUTROS ABS: 2.8 10*3/uL (ref 1.4–7.7)
NEUTROS PCT: 47 % (ref 43.0–77.0)
PLATELETS: 144 10*3/uL — AB (ref 150.0–400.0)
RBC: 5.43 Mil/uL (ref 4.22–5.81)
RDW: 13.4 % (ref 11.5–15.5)
WBC: 5.9 10*3/uL (ref 4.0–10.5)

## 2015-08-12 LAB — LIPID PANEL
Cholesterol: 145 mg/dL (ref 0–200)
HDL: 33.1 mg/dL — ABNORMAL LOW (ref >39.00)
NonHDL: 112.23
TRIGLYCERIDES: 202 mg/dL — AB (ref 0.0–149.0)
Total CHOL/HDL Ratio: 4
VLDL: 40.4 mg/dL — AB (ref 0.0–40.0)

## 2015-08-12 LAB — HEPATIC FUNCTION PANEL
ALBUMIN: 3.9 g/dL (ref 3.5–5.2)
ALT: 29 U/L (ref 0–53)
AST: 29 U/L (ref 0–37)
Alkaline Phosphatase: 60 U/L (ref 39–117)
BILIRUBIN TOTAL: 0.6 mg/dL (ref 0.2–1.2)
Bilirubin, Direct: 0.1 mg/dL (ref 0.0–0.3)
Total Protein: 6.9 g/dL (ref 6.0–8.3)

## 2015-08-12 LAB — BASIC METABOLIC PANEL
BUN: 19 mg/dL (ref 6–23)
CO2: 30 mEq/L (ref 19–32)
Calcium: 9.2 mg/dL (ref 8.4–10.5)
Chloride: 104 mEq/L (ref 96–112)
Creatinine, Ser: 1.05 mg/dL (ref 0.40–1.50)
GFR: 73.56 mL/min (ref >60.00)
GLUCOSE: 99 mg/dL (ref 70–99)
POTASSIUM: 4 meq/L (ref 3.5–5.1)
SODIUM: 139 meq/L (ref 135–145)

## 2015-08-12 LAB — LDL CHOLESTEROL, DIRECT: Direct LDL: 80 mg/dL

## 2015-08-12 LAB — MAGNESIUM: MAGNESIUM: 2.2 mg/dL (ref 1.5–2.5)

## 2015-08-12 NOTE — Addendum Note (Signed)
Addended by: Eulis Foster on: 08/12/2015 08:06 AM   Modules accepted: Orders

## 2015-08-12 NOTE — Progress Notes (Signed)
Quick Note:  Please make copy of labs for patient visit. ______ 

## 2015-08-13 ENCOUNTER — Encounter: Payer: Self-pay | Admitting: *Deleted

## 2015-08-19 ENCOUNTER — Encounter: Payer: Self-pay | Admitting: Cardiology

## 2015-08-19 ENCOUNTER — Ambulatory Visit (INDEPENDENT_AMBULATORY_CARE_PROVIDER_SITE_OTHER): Payer: Medicare Other | Admitting: Cardiology

## 2015-08-19 VITALS — BP 130/52 | HR 56 | Ht 69.0 in | Wt 182.8 lb

## 2015-08-19 DIAGNOSIS — I255 Ischemic cardiomyopathy: Secondary | ICD-10-CM | POA: Diagnosis not present

## 2015-08-19 DIAGNOSIS — Z4502 Encounter for adjustment and management of automatic implantable cardiac defibrillator: Secondary | ICD-10-CM

## 2015-08-19 DIAGNOSIS — E782 Mixed hyperlipidemia: Secondary | ICD-10-CM

## 2015-08-19 NOTE — Patient Instructions (Signed)
Medication Instructions:  Your physician recommends that you continue on your current medications as directed. Please refer to the Current Medication list given to you today.  Labwork: none  Testing/Procedures: none  Follow-Up: Your physician wants you to follow-up in: 4 months with fasting labs (lp/bmet/hfp/magnsium)  You will receive a reminder letter in the mail two months in advance. If you don't receive a letter, please call our office to schedule the follow-up appointment.

## 2015-08-19 NOTE — Progress Notes (Signed)
Cardiology Office Note   Date:  08/19/2015   ID:  Joel Lin, DOB 1941/05/12, MRN [id number]  PCP:  Kandice Hams, MD  Cardiologist: Darlin Coco MD  No chief complaint on file.     History of Present Illness: Joel Lin is a 74 y.o. male who presents for a scheduled follow-up visit  He has a complex past medical history. He has a history of known ischemic heart disease. He has a history of a remote myocardial infarction at the age of 89. That was a large posterolateral myocardial infarction in 1981. The patient had angioplasty and stent of his LAD in 1999. In 2009 he had an out of hospital cardiac arrest at home was successfully resuscitated and treated with hypothermia without significant neurologic sequelae. He now has a defibrillator in place and is followed by Dr. Caryl Comes. He has a history of hypercholesterolemia. His last nuclear stress test was 10/24/11 and showed an ejection fraction of 45% and a large posterolateral scar but no ischemia. His last echocardiogram was in 2014 and showed an ejection fraction of 40%. On 12/06/13 the patient underwent a stent graft for his abdominal aortic aneurysm. The patient did well and was discharged without complications. The patient has his defibrillator monitored through Dr. Olin Pia office. In January 2013 he had 2 episodes of ventricular tachycardia which were asymptomatic. In March 2014 he was rehospitalized after having 3 consecutive shocks from his defibrillator. He underwent cardiac catheterization by Dr. Burt Knack on 02/28/13 who told him that his arteries have not shown any progression of atherosclerosis since his previous cardiac catheterization in 2009.   On 02/10/15 the patient presented to [facility] ED due to ICD shock. He stated he was playing golf and just finished 9 holes and was starting on the back 9 when he suddenly felt dizzy. The next thing he knows his device shocked him. He called 911 and [place] back [place]  [place] where EMS arrived and gave him four aspirin.  Pt was transferred to [facility] for EP eval. He did well overnight without any ICD shocks. Troponin was negative. He was seen by Dr. Caryl Comes and found stable for discharge. He had been placed on po amiodarone but Dr. Caryl Comes held on initiating amiodarone given relative infrequency of V tach. At the time of his hospitalization he was switched from atenolol to bisoprolol. His most recent hospitalization following a ICD discharge was on 06/24/15.  During that admission his atenolol was stopped and he was switched to sotalol initially 80 mg twice a day and subsequently increased to 120 mg twice a day.  He has had no further discharges from his defibrillator.  He was able to play 9 holes of golf last week. The patient has been experiencing some tenderness of his nipples.  He is not on spironolactone or any other agent known to cause problems with gynecomastia.    Past Medical History  Diagnosis Date  . Coronary artery disease     myoview 2011>>EF of 53% / large area of old inferolateral infarct but no reversible ischemia          . Cardiac arrest 03/29/2008    at home without resuscitation anywhere from 7-10 minutes before EMS arrived/ The patient was noted to be in ventricular fibrillation  required defibrillation x3               . Hypercoagulable state     Questionable history of a borderline hypercoagulable state  . Hyperlipidemia   .  Anoxic brain damage      resolved  . Respiratory failure 2009    Respiratory failure and pulmonary collapse requiring intubation  . 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 PVCs (premature ventricular contractions)     occasionally  . Peripheral vascular disease   . AAA (abdominal aortic aneurysm)   . Contrast media allergy   . Myocardial infarction 1981  . Hemorrhoid   . Pacemaker   . Automatic implantable cardioverter-defibrillator in situ     Past Surgical History    Procedure Laterality Date  . Cholecystectomy, laparoscopic  08/13/2009    Calculous cholecystitis  . Insert / replace / remove pacemaker  04/07/2008      cardioverter defibrillator, [employer] Jude Single-chamber defibrillator implantation with intraoperative defibrillation threshold testing /  Durata 7121 dual-coil active defibrillator lead, serial  #[id number]   . Cardiac catheterization  04/04/2008     LAD stent patent, D1 OK, OM1 90/80/80% (24mm vessel), RCA 30%; EF 35%.  . Coronary angioplasty with stent placement  1999    PTCA and stenting of his left anterior descending artery  . Cholecystectomy    . Tonsillectomy    . Abdominal aortic endovascular stent graft N/A 12/06/2013    Procedure: ABDOMINAL AORTIC ENDOVASCULAR STENT GRAFT;  Surgeon: Rosetta Posner, MD;  Location: [place];  Service: Vascular;  Laterality: N/A;  . Left heart catheterization with coronary angiogram N/A 02/28/2013    Procedure: LEFT HEART CATHETERIZATION WITH CORONARY ANGIOGRAM;  Surgeon: Sherren Mocha, MD;  Location: [facility] CATH LAB;  Service: Cardiovascular;  Laterality: N/A;     Current Outpatient Prescriptions  Medication Sig Dispense Refill  . aspirin 81 MG tablet Take 81 mg by mouth daily.     . Cholecalciferol (VITAMIN D-3 PO) Take 300 mg by mouth daily.     . colesevelam (WELCHOL) 625 MG tablet Take 1 tablet (625 mg total) by mouth 2 (two) times daily with a meal. 180 tablet 2  . Fexofenadine HCl (ALLEGRA PO) Take 1 tablet by mouth daily.     . fish oil-omega-3 fatty acids 1000 MG capsule Take 1 g by mouth daily.     . Glucosamine Sulfate-MSM (GLUCOSAMINE-MSM DS PO) Take 1 tablet by mouth daily.    Marland Kitchen lisinopril (PRINIVIL,ZESTRIL) 2.5 MG tablet Take 0.5 tablets (1.25 mg total) by mouth 2 (two) times daily. 180 tablet 3  . loratadine (CLARITIN) 10 MG tablet Take 10 mg by mouth daily.    . magnesium oxide (MAG-OX) 400 (241.3 MG) MG tablet Take 1 tablet (400 mg total) by mouth daily. 30 tablet 6  . Multiple Vitamin  (MULTIVITAMIN WITH MINERALS) TABS Take 1 tablet by mouth daily.    . nitroGLYCERIN (NITRODUR - DOSED IN MG/24 HR) 0.2 mg/hr patch Place 1 patch (0.2 mg total) onto the skin daily. 90 patch 3  . nitroGLYCERIN (NITROSTAT) 0.4 MG SL tablet Place 0.4 mg under the tongue every 5 (five) minutes as needed for chest pain (for chest pain).    . rosuvastatin (CRESTOR) 5 MG tablet Take 5 mg by mouth every other day.    . sotalol (BETAPACE) 120 MG tablet Take 1 tablet (120 mg total) by mouth every 12 (twelve) hours. 180 tablet 3   No current facility-administered medications for this visit.    Allergies:   Epinephrine; Losartan; Metoprolol; Mydriacyl; Phenylephrine hcl; Bisoprolol; Clopidogrel bisulfate; Iodine; and Statins    Social History:  The patient  reports that he quit smoking about 35 years ago.  His smoking use included Cigarettes. He has a 20 pack-year smoking history. He has never used smokeless tobacco. He reports that he does not drink alcohol or use illicit drugs.   Family History:  The patient's family history includes Heart disease in his father and mother; Hyperlipidemia in his father and mother; Mitral valve prolapse in his sister; Stomach cancer in an other family member.    ROS:  Please see the history of present illness.   Otherwise, review of systems are positive for none.   All other systems are reviewed and negative.    PHYSICAL EXAM: VS:  BP 130/52 mmHg  Pulse 56  Ht 5\' 9"  (1.753 m)  Wt 182 lb 12.8 oz (82.918 kg)  BMI 26.98 kg/m2 , BMI Body mass index is 26.98 kg/(m^2). GEN: Well nourished, well developed, in no acute distress HEENT: normal Neck: no JVD, carotid bruits, or masses Cardiac: RRR; no murmurs, rubs, or gallops,no edema  Respiratory:  clear to auscultation bilaterally, normal work of breathing GI: soft, nontender, nondistended, + BS MS: no deformity or atrophy Skin: warm and dry, no rash Neuro:  Strength and sensation are intact Psych: euthymic mood, full  affect   EKG:  EKG is not ordered today.    Recent Labs: 06/24/2015: TSH 1.963 08/12/2015: ALT 29; BUN 19; Creatinine, Ser 1.05; Hemoglobin 15.1; Magnesium 2.2; Platelets 144.0*; Potassium 4.0; Sodium 139    Lipid Panel    Component Value Date/Time   CHOL 145 08/12/2015 0806   TRIG 202.0* 08/12/2015 0806   HDL 33.10* 08/12/2015 0806   CHOLHDL 4 08/12/2015 0806   VLDL 40.4* 08/12/2015 0806   LDLCALC 84 02/23/2015 0805   LDLDIRECT 80.0 08/12/2015 0806      Wt Readings from Last 3 Encounters:  08/19/15 182 lb 12.8 oz (82.918 kg)  07/28/15 185 lb (83.915 kg)  07/07/15 182 lb 6.4 oz (82.736 kg)         ASSESSMENT AND PLAN:  1. Ischemic heart disease status post remote posterolateral myocardial infarction in 1981. Status post angioplasty and stent of LAD in 1999. 2. ICD in place following successful out of hospital ventricular fibrillation arrest and resuscitation in 2009 3. status post stent graft surgery for abdominal aortic aneurysm December 2014 4. dyslipidemia on low-dose Crestor 5.History of hypertension. Blood pressure now normal on low-dose sotolol and lisinopril   Current medicines are reviewed at length with the patient today.  The patient does not have concerns regarding medicines.  The following changes have been made:  We are going to change the timing of his lisinopril.  Instead of taking it at the same time that he takes his sotalol morning and night he will take it once a day at noon.  He will try taking a full 2.5 mg tablet at noon but if this causes his blood pressure did drop to much he will cut the dose in half.  Labs/ tests ordered today include:  No orders of the defined types were placed in this encounter.    Disposition: Recheck in 4 months for office visit lipid panel hepatic function panel and basal metabolic panel and magnesium  Signed, Darlin Coco MD 08/19/2015 5:54 PM    [facility] [place],  [place],   [address] Phone: [phone]; Fax: [phone]

## 2015-08-24 ENCOUNTER — Other Ambulatory Visit: Payer: Self-pay | Admitting: *Deleted

## 2015-08-24 MED ORDER — NITROGLYCERIN 0.4 MG SL SUBL: 0.4000 mg | SUBLINGUAL_TABLET | SUBLINGUAL | Status: DC | PRN

## 2015-08-31 ENCOUNTER — Other Ambulatory Visit: Payer: Self-pay | Admitting: *Deleted

## 2015-08-31 DIAGNOSIS — Z79899 Other long term (current) drug therapy: Secondary | ICD-10-CM

## 2015-09-10 ENCOUNTER — Other Ambulatory Visit: Payer: Self-pay | Admitting: Cardiology

## 2015-09-18 ENCOUNTER — Telehealth: Payer: Self-pay | Admitting: Cardiology

## 2015-09-18 MED ORDER — LISINOPRIL 2.5 MG PO TABS: 2.5000 mg | ORAL_TABLET | Freq: Every day | ORAL | Status: DC

## 2015-09-18 NOTE — Telephone Encounter (Signed)
New message       Talk to [facility] regarding patients medication

## 2015-09-18 NOTE — Telephone Encounter (Signed)
Agree 

## 2015-09-18 NOTE — Telephone Encounter (Signed)
Spoke with patient and he has been taking his Lisinopril 2.5 mg once daily and not 1/2 tablet twice a day Has been taking this way since d/c from hospital in July  Refilled medication as requested

## 2015-09-27 ENCOUNTER — Encounter: Payer: Self-pay | Admitting: Nurse Practitioner

## 2015-09-27 NOTE — Progress Notes (Signed)
Electrophysiology Office Note Date: 09/28/2015  ID:  Joel Lin, DOB 1941-03-17, MRN [id number]  PCP: Kandice Hams, MD Primary Cardiologist: Mare Ferrari Electrophysiologist: Caryl Comes  CC: Routine ICD follow-up  Joel Lin is a 74 y.o. male seen today for Dr Caryl Comes.  He was admitted in July of this year following appropriate therapy from ICD and was started on Sotalol 120mg  bid at that time.  He was seen by Dr Caryl Comes in follow and asked to follow up today for further evaluation. Since last being seen in our clinic, the patient reports that he has had decreased exercise tolerance and feels like there is a "governer" on his heart rate. Resting heart rates were previously in the 60's and have consistently been in the 40's recently. He denies chest pain, palpitations, dyspnea, PND, orthopnea, nausea, vomiting, dizziness, syncope, edema, weight gain, or early satiety.  He has not had ICD shocks.   Device History: STJ single chamber ICD implanted 2009 for secondary prevention History of appropriate therapy: yes, and inappropriate therapy  History of AAD therapy: yes - sotalol   Past Medical History  Diagnosis Date  . Coronary artery disease     myoview 2011>>EF of 53% / large area of old inferolateral infarct but no reversible ischemia          . Cardiac arrest ([place]) 03/29/2008    a. resuscitated OOH VF arrest  b. s/p STJ ICD  . Hypercoagulable state ([place])     Questionable history of a borderline hypercoagulable state  . Hyperlipidemia   . PVCs (premature ventricular contractions)     occasionally  . Peripheral vascular disease ([place])   . AAA (abdominal aortic aneurysm) ([place])   . Contrast media allergy   . Myocardial infarction ([place]) 1981  . Hemorrhoid   . Ventricular tachycardia ([place])     a. appropriate ICD therpay b. on Sotalol   Past Surgical History  Procedure Laterality Date  . Cholecystectomy, laparoscopic  08/13/2009    Calculous cholecystitis  . Cardiac  defibrillator placement  04/07/2008     STJ single chamber ICD implanted for secondary prevention  . Cardiac catheterization  04/04/2008     LAD stent patent, D1 OK, OM1 90/80/80% (23mm vessel), RCA 30%; EF 35%.  . Coronary angioplasty with stent placement  1999    PTCA and stenting of his left anterior descending artery  . Cholecystectomy    . Tonsillectomy    . Abdominal aortic endovascular stent graft N/A 12/06/2013    Procedure: ABDOMINAL AORTIC ENDOVASCULAR STENT GRAFT;  Surgeon: Rosetta Posner, MD;  Location: [place];  Service: Vascular;  Laterality: N/A;  . Left heart catheterization with coronary angiogram N/A 02/28/2013    Procedure: LEFT HEART CATHETERIZATION WITH CORONARY ANGIOGRAM;  Surgeon: Sherren Mocha, MD;  Location: [facility] CATH LAB;  Service: Cardiovascular;  Laterality: N/A;    Current Outpatient Prescriptions  Medication Sig Dispense Refill  . aspirin 81 MG tablet Take 81 mg by mouth daily.     . Cholecalciferol (VITAMIN D-3 PO) Take 300 mg by mouth daily.     . colesevelam (WELCHOL) 625 MG tablet Take 1 tablet (625 mg total) by mouth 2 (two) times daily with a meal. 180 tablet 2  . Fexofenadine HCl (ALLEGRA PO) Take 1 tablet by mouth daily.     . fish oil-omega-3 fatty acids 1000 MG capsule Take 1 g by mouth daily.     . Glucosamine Sulfate-MSM (GLUCOSAMINE-MSM DS PO) Take 1 tablet by mouth daily.    Marland Kitchen  lisinopril (PRINIVIL,ZESTRIL) 2.5 MG tablet Take 1 tablet (2.5 mg total) by mouth daily. 90 tablet 3  . magnesium oxide (MAG-OX) 400 (241.3 MG) MG tablet Take 1 tablet (400 mg total) by mouth daily. 30 tablet 6  . Multiple Vitamin (MULTIVITAMIN WITH MINERALS) TABS Take 1 tablet by mouth daily.    . nitroGLYCERIN (NITRODUR - DOSED IN MG/24 HR) 0.2 mg/hr patch Place 1 patch (0.2 mg total) onto the skin daily. 90 patch 3  . nitroGLYCERIN (NITROSTAT) 0.4 MG SL tablet Place 1 tablet (0.4 mg total) under the tongue every 5 (five) minutes as needed for chest pain (for chest pain). 25  tablet PRN  . rosuvastatin (CRESTOR) 5 MG tablet Take 5 mg by mouth every other day.    . sotalol (BETAPACE) 80 MG tablet Take 1 tablet (80 mg total) by mouth 2 (two) times daily. 60 tablet 0   No current facility-administered medications for this visit.    Allergies:   Epinephrine; Losartan; Metoprolol; Mydriacyl; Phenylephrine hcl; Bisoprolol; Clopidogrel bisulfate; Iodine; and Statins   Social History: Social History   Social History  . Marital Status: Married    Spouse Name: N/A  . Number of Children: N/A  . Years of Education: N/A   Occupational History  . Retired Pharmacist, hospital    Social History Main Topics  . Smoking status: Former Smoker -- 1.00 packs/day for 20 years    Types: Cigarettes    Quit date: 12/23/1979  . Smokeless tobacco: Never Used  . Alcohol Use: No  . Drug Use: No  . Sexual Activity: Not on file   Other Topics Concern  . Not on file   Social History Narrative    Family History: Family History  Problem Relation Age of Onset  . Stomach cancer    . Hyperlipidemia Mother   . Heart disease Mother   . Heart disease Father     before age 68  . Hyperlipidemia Father   . Mitral valve prolapse Sister     Review of Systems: All other systems reviewed and are otherwise negative except as noted above.   Physical Exam: VS:  BP 132/60 mmHg  Pulse 46  Ht 5\' 9"  (1.753 m)  Wt 177 lb (80.287 kg)  BMI 26.13 kg/m2 , BMI Body mass index is 26.13 kg/(m^2).  GEN- The patient is well appearing, alert and oriented x 3 today.   HEENT: normocephalic, atraumatic; sclera clear, conjunctiva pink; hearing intact; oropharynx clear; neck supple Lungs- Clear to ausculation bilaterally, normal work of breathing.  No wheezes, rales, rhonchi Heart- Bradycardic regular rate and rhythm, no murmurs, rubs or gallops GI- soft, non-tender, non-distended, bowel sounds present Extremities- no clubbing, cyanosis, or edema; DP/PT/radial pulses 2+ bilaterally MS- no significant  deformity or atrophy Skin- warm and dry, no rash or lesion; ICD pocket well healed Psych- euthymic mood, full affect Neuro- strength and sensation are intact  ICD interrogation- reviewed in detail today,  See PACEART report  EKG:  EKG is ordered today. EKG demonstrates sinus bradycardia, rate 46, 1st degree AV block, QTc 428  Recent Labs: 06/24/2015: TSH 1.963 08/12/2015: ALT 29; BUN 19; Creatinine, Ser 1.05; Hemoglobin 15.1; Magnesium 2.2; Platelets 144.0*; Potassium 4.0; Sodium 139   Wt Readings from Last 3 Encounters:  09/28/15 177 lb (80.287 kg)  08/19/15 182 lb 12.8 oz (82.918 kg)  07/28/15 185 lb (83.915 kg)     Other studies Reviewed: Additional studies/ records that were reviewed today include: Dr Caryl Comes and Dr Sherryl Barters office notes  Assessment and Plan:  1.  Chronic systolic dysfunction euvolemic today Stable on an appropriate medical regimen Normal ICD function See Pace Art report No changes today  2.  VT No recent recurrence Continue Sotalol, decrease dose to 80mg  bid today 2/2 symptomatic bradycardia Recent BMET, Mg normal EKG today with stable QT interval   3.  ICM/CAD No recent ischemic symptoms Continue medical therapy  4.  Symptomatic bradycardia Will decrease Sotalol dose to 80mg  twice daily today Pt may ultimately require addition of atrial lead to ensure chronotropic competence, but will try decreased dose of Sotalol first   Current medicines are reviewed at length with the patient today.   The patient does not have concerns regarding his medicines.  The following changes were made today:  Decrease sotalol to 80mg  bid  Labs/ tests ordered today include:  Orders Placed This Encounter  Procedures  . EKG 12-Lead     Disposition:   Follow up with Dr Caryl Comes in 3 months as scheduled   Signed, Chanetta Marshall, NP 09/28/2015 9:08 AM  [place] [address] [phone] (office) [phone]  (fax)

## 2015-09-28 ENCOUNTER — Encounter: Payer: Self-pay | Admitting: Nurse Practitioner

## 2015-09-28 ENCOUNTER — Ambulatory Visit (INDEPENDENT_AMBULATORY_CARE_PROVIDER_SITE_OTHER): Payer: Medicare Other | Admitting: Nurse Practitioner

## 2015-09-28 VITALS — BP 132/60 | HR 46 | Ht 69.0 in | Wt 177.0 lb

## 2015-09-28 DIAGNOSIS — I472 Ventricular tachycardia, unspecified: Secondary | ICD-10-CM

## 2015-09-28 DIAGNOSIS — I519 Heart disease, unspecified: Secondary | ICD-10-CM

## 2015-09-28 DIAGNOSIS — I255 Ischemic cardiomyopathy: Secondary | ICD-10-CM | POA: Diagnosis not present

## 2015-09-28 DIAGNOSIS — R001 Bradycardia, unspecified: Secondary | ICD-10-CM

## 2015-09-28 LAB — CUP PACEART INCLINIC DEVICE CHECK
Date Time Interrogation Session: 20161017091515
Implantable Lead Implant Date: 20090427
Lead Channel Setting Pacing Amplitude: 2.5 V
MDC IDC LEAD LOCATION: 753860
MDC IDC LEAD MODEL: 7121
MDC IDC SET LEADCHNL RV PACING PULSEWIDTH: 0.6 ms
MDC IDC SET LEADCHNL RV SENSING SENSITIVITY: 0.3 mV
Pulse Gen Serial Number: 549899

## 2015-09-28 MED ORDER — SOTALOL HCL 80 MG PO TABS: 80.0000 mg | ORAL_TABLET | Freq: Two times a day (BID) | ORAL | Status: DC

## 2015-09-28 NOTE — Patient Instructions (Addendum)
Medication Instructions:   START TAKING SOTALOL 80 MG  TWICE A DAY    Labwork:NONE ORDER TODAY   Testing/Procedures: NONE ORDER TODAY   Follow-Up:  IN  3 MONTHS WITH [facility] DEFIB CK   IN  4 MONTHS  WITH DR BRACKBILL  BEFORE RETIRMENT   Any Other Special Instructions Will Be Listed Below (If Applicable).

## 2015-10-14 ENCOUNTER — Encounter: Payer: Medicare Other | Admitting: Nurse Practitioner

## 2015-11-13 ENCOUNTER — Encounter: Payer: Self-pay | Admitting: Internal Medicine

## 2015-12-15 ENCOUNTER — Other Ambulatory Visit: Payer: Self-pay | Admitting: Cardiology

## 2015-12-29 ENCOUNTER — Encounter: Payer: Self-pay | Admitting: Internal Medicine

## 2015-12-29 ENCOUNTER — Ambulatory Visit (INDEPENDENT_AMBULATORY_CARE_PROVIDER_SITE_OTHER): Payer: Medicare Other | Admitting: Internal Medicine

## 2015-12-29 VITALS — BP 140/80 | HR 56 | Ht 69.5 in | Wt 186.8 lb

## 2015-12-29 DIAGNOSIS — I472 Ventricular tachycardia, unspecified: Secondary | ICD-10-CM

## 2015-12-29 DIAGNOSIS — I4589 Other specified conduction disorders: Secondary | ICD-10-CM

## 2015-12-29 DIAGNOSIS — Z95810 Presence of automatic (implantable) cardiac defibrillator: Secondary | ICD-10-CM

## 2015-12-29 DIAGNOSIS — I255 Ischemic cardiomyopathy: Secondary | ICD-10-CM

## 2015-12-29 LAB — MAGNESIUM: MAGNESIUM: 2 mg/dL (ref 1.5–2.5)

## 2015-12-29 LAB — BASIC METABOLIC PANEL
BUN: 18 mg/dL (ref 7–25)
CO2: 27 mmol/L (ref 20–31)
Calcium: 9.5 mg/dL (ref 8.6–10.3)
Chloride: 103 mmol/L (ref 98–110)
Creat: 0.93 mg/dL (ref 0.70–1.18)
GLUCOSE: 77 mg/dL (ref 65–99)
POTASSIUM: 4 mmol/L (ref 3.5–5.3)
Sodium: 140 mmol/L (ref 135–146)

## 2015-12-29 MED ORDER — LISINOPRIL 5 MG PO TABS: 5.0000 mg | ORAL_TABLET | Freq: Every day | ORAL | Status: DC

## 2015-12-29 NOTE — Progress Notes (Signed)
Patient Care Team: [name], MD as PCP - General (Internal Medicine) Darlin Coco, MD as Attending Physician (Cardiology) Deboraha Sprang, MD as Attending Physician (Cardiology)   HPI  Joel Lin is a 75 y.o. male is seen in followup for aborted sudden cardiac death in the setting of ischemic heart disease with  prior PCI and mild depression of LV systolic function. He is status post ICD implantation.    he was hospitalized 7/16; these records were reviewed. He had ventricular tachycardia failed terminated with ATP and for which he was shocked. A second episode of monomorphic ventricular tachycardia terminated on its own. He was started on sotalol and his atenolol was discontinued.  The patient has no chest pain. He does have significant exercise limitations. He is unable to generate a heart rate of more than 70 weeks feels a history of valve.   There has been Myoview scan in May 13; ejection fraction had improved to 53%; previous IMI evident. Recent echo was corroborating   He underwent stent grafting of the abdominal aortic aneurysm  He has a history of prior inappropriate ICD discharges secondary to sinus tachycardia  He also has in 2013 ventricular tachycardia terminated with antitachycardia pacing;  this was unassociated with symptoms and was treated with increased dose of beta blockers      Past Medical History  Diagnosis Date  . Coronary artery disease     myoview 2011>>EF of 53% / large area of old inferolateral infarct but no reversible ischemia          . Cardiac arrest ([place]) 03/29/2008    a. resuscitated OOH VF arrest  b. s/p STJ ICD  . Hypercoagulable state ([place])     Questionable history of a borderline hypercoagulable state  . Hyperlipidemia   . PVCs (premature ventricular contractions)     occasionally  . Peripheral vascular disease ([place])   . AAA (abdominal aortic aneurysm) ([place])   . Contrast media allergy   . Myocardial infarction ([place])  1981  . Hemorrhoid   . Ventricular tachycardia ([place])     a. appropriate ICD therpay b. on Sotalol    Past Surgical History  Procedure Laterality Date  . Cholecystectomy, laparoscopic  08/13/2009    Calculous cholecystitis  . Cardiac defibrillator placement  04/07/2008     STJ single chamber ICD implanted for secondary prevention  . Cardiac catheterization  04/04/2008     LAD stent patent, D1 OK, OM1 90/80/80% (18mm vessel), RCA 30%; EF 35%.  . Coronary angioplasty with stent placement  1999    PTCA and stenting of his left anterior descending artery  . Cholecystectomy    . Tonsillectomy    . Abdominal aortic endovascular stent graft N/A 12/06/2013    Procedure: ABDOMINAL AORTIC ENDOVASCULAR STENT GRAFT;  Surgeon: Rosetta Posner, MD;  Location: [facility];  Service: Vascular;  Laterality: N/A;  . Left heart catheterization with coronary angiogram N/A 02/28/2013    Procedure: LEFT HEART CATHETERIZATION WITH CORONARY ANGIOGRAM;  Surgeon: Sherren Mocha, MD;  Location: [facility] CATH LAB;  Service: Cardiovascular;  Laterality: N/A;    Current Outpatient Prescriptions  Medication Sig Dispense Refill  . aspirin 81 MG tablet Take 81 mg by mouth daily.     . Cholecalciferol (VITAMIN D-3 PO) Take 300 mg by mouth daily.     Marland Kitchen Fexofenadine HCl (ALLEGRA PO) Take 1 tablet by mouth daily.     . fish oil-omega-3 fatty acids 1000 MG capsule Take 1  g by mouth daily.     . Glucosamine Sulfate-MSM (GLUCOSAMINE-MSM DS PO) Take 1 tablet by mouth daily.    Marland Kitchen lisinopril (PRINIVIL,ZESTRIL) 2.5 MG tablet Take 1 tablet (2.5 mg total) by mouth daily. 90 tablet 3  . magnesium oxide (MAG-OX) 400 (241.3 MG) MG tablet Take 1 tablet (400 mg total) by mouth daily. 30 tablet 6  . Multiple Vitamin (MULTIVITAMIN WITH MINERALS) TABS Take 1 tablet by mouth daily.    . nitroGLYCERIN (NITRODUR - DOSED IN MG/24 HR) 0.2 mg/hr patch Place 1 patch (0.2 mg total) onto the skin daily. 90 patch 3  . nitroGLYCERIN (NITROSTAT) 0.4 MG SL tablet  Place 1 tablet (0.4 mg total) under the tongue every 5 (five) minutes as needed for chest pain (for chest pain). (Patient taking differently: Place 0.4 mg under the tongue every 5 (five) minutes as needed for chest pain (for chest pain). (up to 3 doses)) 25 tablet PRN  . rosuvastatin (CRESTOR) 5 MG tablet Take 5 mg by mouth every other day.    . sotalol (BETAPACE) 80 MG tablet Take 1 tablet (80 mg total) by mouth 2 (two) times daily. 60 tablet 0  . WELCHOL 625 MG tablet Take 1 tablet (625 mg  total) by mouth 2 (two)  times daily with a meal. 180 tablet 2   No current facility-administered medications for this visit.    Allergies  Allergen Reactions  . Epinephrine Other (See Comments)    Elevated heartbeat  . Losartan Palpitations and Other (See Comments)    Irregular heartbeat  . Metoprolol Other (See Comments)    Irregular heartbeat  . Mydriacyl [Tropicamide] Other (See Comments)    Elevated heartbeat  . Phenylephrine Hcl Other (See Comments)    Increased heartrate  . Bisoprolol Other (See Comments)    Feels it does not agree with him  . Clopidogrel Bisulfate Hives       . Iodine Hives    "Renografin"  . Statins Other (See Comments)    Muscle cramps (crestor low dose is ok)    Review of Systems negative except from HPI and PMH  Physical Exam BP 140/80 mmHg  Pulse 56  Ht 5' 9.5" (1.765 m)  Wt 186 lb 12.8 oz (84.732 kg)  BMI 27.20 kg/m2 Well developed and well nourished in no acute distress HENT normal E scleral and icterus clear Neck Supple JVP flat; carotids brisk and full Clear to ausculation Device pocket well healed; without hematoma or erythema.  There is no tethering Regular rate and rhythm, no murmurs gallops or rub Soft with active bowel sounds No clubbing cyanosis none Edema Alert and oriented, grossly normal motor and sensory function Skin Warm and Dry   ECG sinus rhythm at  56 Intervals 23/12/43 Axis (-78  Inferolateral infarct  Assessment and   Plan  Aborted cardiac arrest  Ischemic Cardiomyopathy  Ventricular tachycardia  Implantable defibrillator The patient's device was interrogated.  The information was reviewed. No changes were made in the programming.     He is stable Without symptoms of ischemia  .No intercurrent Ventricular tachycardia  Will continue sotalol for VT  He is functional limitations may be related to chronotropic incompetence. Device interrogation demonstrates no significant heart beats greater than 70. The sensation that he has with exercise and coming to with limits raises the possibility also that there is a change in activation. We will put him on a treadmill to sort this out.  We have reviewed to potential solutions one would  be medicinal with the change of sotalol to dofetilide removing the beta-blockade and the other would be upgrade of his device to dual-chamber device to allow for atrial pacing

## 2015-12-29 NOTE — Patient Instructions (Addendum)
Medication Instructions: - Increase lisinopril to 5 mg one tablet by mouth once daily  Labwork: - Your physician recommends that you have lab work today: BMP/ Magnesium   Procedures/Testing: - Your physician has requested that you have an exercise tolerance test with Dr. Caryl Comes (ok to double book). For further information please visit [email or web address]. Please also follow instruction sheet, as given.  Follow-Up: - Remote monitoring is used to monitor your Pacemaker of ICD from home. This monitoring reduces the number of office visits required to check your device to one time per year. It allows [place] to keep an eye on the functioning of your device to ensure it is working properly. You are scheduled for a device check from home on 03/29/16. You may send your transmission at any time that day. If you have a wireless device, the transmission will be sent automatically. After your physician reviews your transmission, you will receive a postcard with your next transmission date.  - Your physician wants you to follow-up in: 6 months with Dr. Caryl Comes. You will receive a reminder letter in the mail two months in advance. If you don't receive a letter, please call our office to schedule the follow-up appointment.  Any Additional Special Instructions Will Be Listed Below (If Applicable). - none

## 2016-01-01 LAB — CUP PACEART INCLINIC DEVICE CHECK
HIGH POWER IMPEDANCE MEASURED VALUE: 47.7443
Lead Channel Impedance Value: 287.5 Ohm
Lead Channel Pacing Threshold Amplitude: 1.25 V
Lead Channel Pacing Threshold Pulse Width: 0.6 ms
Lead Channel Sensing Intrinsic Amplitude: 7.8 mV
Lead Channel Setting Pacing Amplitude: 2.5 V
MDC IDC LEAD IMPLANT DT: 20090427
MDC IDC LEAD LOCATION: 753860
MDC IDC LEAD MODEL: 7121
MDC IDC MSMT BATTERY REMAINING LONGEVITY: 19.2
MDC IDC MSMT BATTERY VOLTAGE: 2.54 V
MDC IDC PG SERIAL: 549899
MDC IDC SESS DTM: 20170117190813
MDC IDC SET LEADCHNL RV PACING PULSEWIDTH: 0.6 ms
MDC IDC SET LEADCHNL RV SENSING SENSITIVITY: 0.3 mV
MDC IDC STAT BRADY RV PERCENT PACED: 0.73 %

## 2016-01-08 ENCOUNTER — Ambulatory Visit (INDEPENDENT_AMBULATORY_CARE_PROVIDER_SITE_OTHER): Payer: Medicare Other

## 2016-01-08 ENCOUNTER — Encounter: Payer: Medicare Other | Admitting: Internal Medicine

## 2016-01-08 DIAGNOSIS — I4589 Other specified conduction disorders: Secondary | ICD-10-CM

## 2016-01-08 LAB — EXERCISE TOLERANCE TEST
CHL CUP MPHR: 147 {beats}/min
CHL CUP RESTING HR STRESS: 50 {beats}/min

## 2016-01-28 ENCOUNTER — Ambulatory Visit (INDEPENDENT_AMBULATORY_CARE_PROVIDER_SITE_OTHER): Payer: Medicare Other | Admitting: Cardiology

## 2016-01-28 ENCOUNTER — Encounter: Payer: Self-pay | Admitting: Cardiology

## 2016-01-28 VITALS — BP 126/70 | HR 52 | Ht 69.0 in | Wt 185.1 lb

## 2016-01-28 DIAGNOSIS — I519 Heart disease, unspecified: Secondary | ICD-10-CM

## 2016-01-28 DIAGNOSIS — I472 Ventricular tachycardia, unspecified: Secondary | ICD-10-CM

## 2016-01-28 DIAGNOSIS — E782 Mixed hyperlipidemia: Secondary | ICD-10-CM | POA: Diagnosis not present

## 2016-01-28 DIAGNOSIS — I255 Ischemic cardiomyopathy: Secondary | ICD-10-CM | POA: Diagnosis not present

## 2016-01-28 NOTE — Progress Notes (Signed)
Cardiology Office Note   Date:  01/28/2016   ID:  Joel Lin, DOB [birth year], MRN [id number]  PCP:  Kandice Hams, MD  Cardiologist: Darlin Coco MD  Chief Complaint  Patient presents with  . routine scheduled visit    denies cp, sob, lee, or claudication      History of Present Illness: Joel Lin is a 75 y.o. male who presents for follow-up visit.  Joel Lin is a 75 y.o. male who presents for a scheduled follow-up visit  He has a complex past medical history. He has a history of known ischemic heart disease. He has a history of a remote myocardial infarction at the age of 47. That was a large posterolateral myocardial infarction in 1981. The patient had angioplasty and stent of his LAD in 1999. In 2009 he had an out of hospital cardiac arrest at home was successfully resuscitated and treated with hypothermia without significant neurologic sequelae. He now has a defibrillator in place and is followed by Dr. Caryl Comes. He has a history of hypercholesterolemia. His last nuclear stress test was 10/24/11 and showed an ejection fraction of 45% and a large posterolateral scar but no ischemia. His last echocardiogram was in 2014 and showed an ejection fraction of 40%. On 12/06/13 the patient underwent a stent graft for his abdominal aortic aneurysm. The patient did well and was discharged without complications. The patient has his defibrillator monitored through Dr. Olin Pia office. In January 2013 he had 2 episodes of ventricular tachycardia which were asymptomatic. In March 2014 he was rehospitalized after having 3 consecutive shocks from his defibrillator. He underwent cardiac catheterization by Dr. Burt Knack on 02/28/13 who told him that his arteries have not shown any progression of atherosclerosis since his previous cardiac catheterization in 2009.   On 02/10/15 the patient presented to [facility] ED due to ICD shock. He stated he was playing golf and just finished  9 holes and was starting on the back 9 when he suddenly felt dizzy. The next thing he knows his device shocked him. He called 911 and went back to the clubhouse where EMS arrived and gave him four aspirin.  Pt was transferred to [facility] for EP eval. He did well overnight without any ICD shocks. Troponin was negative. He was seen by Dr. Caryl Comes and found stable for discharge. He had been placed on po amiodarone but Dr. Caryl Comes held on initiating amiodarone given relative infrequency of V tach.  His most recent hospitalization following a ICD discharge was on 06/24/15. During that admission his atenolol was stopped and he was switched to sotalol initially 80 mg twice a day and subsequently increased to 120 mg twice a day.  More recently the dose was decreased again to 80 mg twice a day. He has had no further discharges from his defibrillator. Dr. Caryl Comes recently had him walk on the treadmill.  The patient reports that he was able to get his heart rate up to 114. The patient has not been as careful with his diet.  His weight is up 8 pounds since October 2016.  Past Medical History  Diagnosis Date  . Coronary artery disease     myoview 2011>>EF of 53% / large area of old inferolateral infarct but no reversible ischemia          . Cardiac arrest ([place]) 03/29/2008    a. resuscitated OOH VF arrest  b. s/p STJ ICD  . Hypercoagulable state ([place])     Questionable history  of a borderline hypercoagulable state  . Hyperlipidemia   . PVCs (premature ventricular contractions)     occasionally  . Peripheral vascular disease ([place])   . AAA (abdominal aortic aneurysm) ([place])   . Contrast media allergy   . Myocardial infarction ([place]) 1981  . Hemorrhoid   . Ventricular tachycardia ([place])     a. appropriate ICD therpay b. on Sotalol    Past Surgical History  Procedure Laterality Date  . Cholecystectomy, laparoscopic  08/13/2009    Calculous cholecystitis  . Cardiac defibrillator placement  04/07/2008     STJ  single chamber ICD implanted for secondary prevention  . Cardiac catheterization  04/04/2008     LAD stent patent, D1 OK, OM1 90/80/80% (88mm vessel), RCA 30%; EF 35%.  . Coronary angioplasty with stent placement  1999    PTCA and stenting of his left anterior descending artery  . Cholecystectomy    . Tonsillectomy    . Abdominal aortic endovascular stent graft N/A 12/06/2013    Procedure: ABDOMINAL AORTIC ENDOVASCULAR STENT GRAFT;  Surgeon: Rosetta Posner, MD;  Location: [facility];  Service: Vascular;  Laterality: N/A;  . Left heart catheterization with coronary angiogram N/A 02/28/2013    Procedure: LEFT HEART CATHETERIZATION WITH CORONARY ANGIOGRAM;  Surgeon: Sherren Mocha, MD;  Location: [facility] CATH LAB;  Service: Cardiovascular;  Laterality: N/A;     Current Outpatient Prescriptions  Medication Sig Dispense Refill  . aspirin 81 MG tablet Take 81 mg by mouth daily.     . Cholecalciferol (VITAMIN D-3 PO) Take 300 mg by mouth daily.     Marland Kitchen Fexofenadine HCl (ALLEGRA PO) Take 1 tablet by mouth daily.     . fish oil-omega-3 fatty acids 1000 MG capsule Take 1 g by mouth daily.     . Glucosamine Sulfate-MSM (GLUCOSAMINE-MSM DS PO) Take 1 tablet by mouth daily.    Marland Kitchen lisinopril (PRINIVIL,ZESTRIL) 5 MG tablet Take 1 tablet (5 mg total) by mouth daily. 90 tablet 3  . magnesium oxide (MAG-OX) 400 (241.3 MG) MG tablet Take 1 tablet (400 mg total) by mouth daily. 30 tablet 6  . Multiple Vitamin (MULTIVITAMIN WITH MINERALS) TABS Take 1 tablet by mouth daily.    . nitroGLYCERIN (NITRODUR - DOSED IN MG/24 HR) 0.2 mg/hr patch Place 1 patch (0.2 mg total) onto the skin daily. 90 patch 3  . nitroGLYCERIN (NITROSTAT) 0.4 MG SL tablet Place 0.4 mg under the tongue every 5 (five) minutes as needed for chest pain ((MAX 3 DOSES)).    . rosuvastatin (CRESTOR) 5 MG tablet Take 5 mg by mouth every other day.    . sotalol (BETAPACE) 80 MG tablet Take 1 tablet (80 mg total) by mouth 2 (two) times daily. 60 tablet 0  . WELCHOL  625 MG tablet Take 1 tablet (625 mg  total) by mouth 2 (two)  times daily with a meal. 180 tablet 2   No current facility-administered medications for this visit.    Allergies:   Epinephrine; Losartan; Metoprolol; Mydriacyl; Phenylephrine hcl; Bisoprolol; Clopidogrel bisulfate; Iodine; and Statins    Social History:  The patient  reports that he quit smoking about 36 years ago. His smoking use included Cigarettes. He has a 20 pack-year smoking history. He has never used smokeless tobacco. He reports that he does not drink alcohol or use illicit drugs.   Family History:  The patient's family history includes Heart disease in his father and mother; Hyperlipidemia in his father and mother; Mitral valve prolapse in  his sister.    ROS:  Please see the history of present illness.   Otherwise, review of systems are positive for none.   All other systems are reviewed and negative.    PHYSICAL EXAM: VS:  BP 126/70 mmHg  Pulse 52  Ht 5\' 9"  (1.753 m)  Wt 185 lb 1.9 oz (83.97 kg)  BMI 27.33 kg/m2 , BMI Body mass index is 27.33 kg/(m^2). GEN: Well nourished, well developed, in no acute distress HEENT: normal Neck: no JVD, carotid bruits, or masses Cardiac: RRR; no murmurs, rubs, or gallops,no edema  Respiratory:  clear to auscultation bilaterally, normal work of breathing GI: soft, nontender, nondistended, + BS MS: no deformity or atrophy Skin: warm and dry, no rash Neuro:  Strength and sensation are intact Psych: euthymic mood, full affect   EKG:  EKG is not ordered today.   Recent Labs: 06/24/2015: TSH 1.963 08/12/2015: ALT 29; Hemoglobin 15.1; Platelets 144.0* 12/29/2015: BUN 18; Creat 0.93; Magnesium 2.0; Potassium 4.0; Sodium 140    Lipid Panel    Component Value Date/Time   CHOL 145 08/12/2015 0806   TRIG 202.0* 08/12/2015 0806   HDL 33.10* 08/12/2015 0806   CHOLHDL 4 08/12/2015 0806   VLDL 40.4* 08/12/2015 0806   LDLCALC 84 02/23/2015 0805   LDLDIRECT 80.0 08/12/2015 0806        Wt Readings from Last 3 Encounters:  01/28/16 185 lb 1.9 oz (83.97 kg)  12/29/15 186 lb 12.8 oz (84.732 kg)  09/28/15 177 lb (80.287 kg)         ASSESSMENT AND PLAN:  1. Ischemic heart disease status post remote posterolateral myocardial infarction in 1981. Status post angioplasty and stent of LAD in 1999. 2. ICD in place following successful out of hospital ventricular fibrillation arrest and resuscitation in 2009 3. status post stent graft surgery for abdominal aortic aneurysm December 2014 4. dyslipidemia on low-dose Crestor 5.History of hypertension. Blood pressure now normal on low-dose sotolol and lisinopril   Current medicines are reviewed at length with the patient today.  The patient does not have concerns regarding medicines.  The following changes have been made:  no change  Labs/ tests ordered today include:  No orders of the defined types were placed in this encounter.   Disposition: Continue current medication.  Return in 4 months for office visit and EKG with Dr. Irish Lack..  Continue EP follow-up with Dr. Caryl Comes  Signed, Darlin Coco MD 01/28/2016 1:21 PM    [address], [place], [place]  [address] Phone: [phone]; Fax: [phone]

## 2016-01-28 NOTE — Patient Instructions (Signed)
Medication Instructions:  Your physician recommends that you continue on your current medications as directed. Please refer to the Current Medication list given to you today.  Labwork: none  Testing/Procedures: none  Follow-Up: Your physician recommends that you schedule a follow-up appointment in: 4 month ov with Dr Irish Lack  If you need a refill on your cardiac medications before your next appointment, please call your pharmacy.

## 2016-02-01 ENCOUNTER — Ambulatory Visit: Payer: Medicare Other | Admitting: Cardiology

## 2016-02-21 ENCOUNTER — Other Ambulatory Visit: Payer: Self-pay | Admitting: Cardiology

## 2016-03-29 ENCOUNTER — Ambulatory Visit (INDEPENDENT_AMBULATORY_CARE_PROVIDER_SITE_OTHER): Payer: Medicare Other | Admitting: *Deleted

## 2016-03-29 DIAGNOSIS — I255 Ischemic cardiomyopathy: Secondary | ICD-10-CM

## 2016-03-29 DIAGNOSIS — I469 Cardiac arrest, cause unspecified: Secondary | ICD-10-CM | POA: Diagnosis not present

## 2016-03-29 NOTE — Progress Notes (Signed)
Remote ICD transmission.   

## 2016-05-06 ENCOUNTER — Encounter: Payer: Self-pay | Admitting: Cardiology

## 2016-05-06 LAB — CUP PACEART REMOTE DEVICE CHECK
Battery Remaining Longevity: 18 mo
Brady Statistic RV Percent Paced: 1 %
HighPow Impedance: 51 Ohm
Implantable Lead Location: 753860
Lead Channel Sensing Intrinsic Amplitude: 6.4 mV
Lead Channel Setting Pacing Amplitude: 2.5 V
Lead Channel Setting Pacing Pulse Width: 0.6 ms
Lead Channel Setting Sensing Sensitivity: 0.3 mV
MDC IDC LEAD IMPLANT DT: 20090427
MDC IDC LEAD MODEL: 7121
MDC IDC MSMT BATTERY VOLTAGE: 2.54 V
MDC IDC MSMT LEADCHNL RV IMPEDANCE VALUE: 300 Ohm
MDC IDC PG SERIAL: 549899
MDC IDC SESS DTM: 20170418073327

## 2016-05-25 NOTE — Progress Notes (Signed)
Cardiology Office Note   Date:  05/27/2016   ID:  Joel Lin, DOB 12-Feb-1941, MRN [id number]  PCP:  Kandice Hams, MD    No chief complaint on file. CAD   Wt Readings from Last 3 Encounters:  05/27/16 186 lb (84.369 kg)  01/28/16 185 lb 1.9 oz (83.97 kg)  12/29/15 186 lb 12.8 oz (84.732 kg)       History of Present Illness: Joel Lin is a 75 y.o. male  He has a history of known ischemic heart disease. He has a history of a remote myocardial infarction at the age of 69. That was a large posterolateral myocardial infarction in 1981 in [place].  He moved to [place] in 1985.  The patient had angioplasty and stent of his LAD in 1999- Dr. Acie Fredrickson. In 2009 he had an out of hospital cardiac arrest at home was successfully resuscitated and treated with hypothermia without significant neurologic sequelae. No CAD by cath at that time.  He now has a defibrillator in place and is followed by Dr. Caryl Comes. He has a history of hypercholesterolemia. His last nuclear stress test was 10/24/11 and showed an ejection fraction of 45% and a large posterolateral scar but no ischemia. His last echocardiogram was in 2014 and showed an ejection fraction of 40%. On 12/06/13 the patient underwent a stent graft for his abdominal aortic aneurysm. The patient did well and was discharged without complications. The patient has his defibrillator monitored through Dr. Olin Pia office. In January 2013 he had 2 episodes of ventricular tachycardia which were asymptomatic. In March 2014 he was rehospitalized after having 3 consecutive shocks from his defibrillator. He underwent cardiac catheterization by Dr. Burt Knack on 02/28/13 who told him that his arteries have not shown any progression of atherosclerosis since his previous cardiac catheterization in 2009.   On 02/10/15 the patient presented to [facility] ED due to ICD shock. He stated he was playing golf and just finished 9 holes and was starting on the back 9 when  he suddenly felt dizzy. The next thing he knows his device shocked him. He called 911 and went back to the clubhouse where EMS arrived and gave him four aspirin.  Pt was transferred to [facility] for EP eval. He did well overnight without any ICD shocks. Troponin was negative. He was seen by Dr. Caryl Comes and found stable for discharge. He had been placed on po amiodarone but Dr. Caryl Comes held on initiating amiodarone given relative infrequency of V tach.  AAA repair in 12/14.  His most recent hospitalization following a ICD discharge was on 06/24/15. During that admission his atenolol was stopped and he was switched to sotalol initially 80 mg twice a day and subsequently increased to 120 mg twice a day. More recently the dose was decreased again to 80 mg twice a day. He has had no further discharges from his defibrillator. Dr. Caryl Comes recently had him walk on the treadmill. The patient reports that he was able to get his heart rate up to 114.  He reports some upper back pain.  His angina in the past has been very typical.  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  He has some pain from his AICD.  Since sotalol started, no AICD shocks. Tolerates 80 mg BID. Bradycardiac at home.    He does not walk that much.  He also wants to lose wieght.    He stopped his NTG patches due to skin irritation a week ago.  No change in sx.  He had been using them for years.    Past Medical History  Diagnosis Date  . Coronary artery disease     myoview 2011>>EF of 53% / large area of old inferolateral infarct but no reversible ischemia          . Cardiac arrest ([place]) 03/29/2008    a. resuscitated OOH VF arrest  b. s/p STJ ICD  . Hypercoagulable state ([place])     Questionable history of a borderline hypercoagulable state  . Hyperlipidemia   . PVCs (premature ventricular contractions)     occasionally  . Peripheral vascular disease ([place])   . AAA (abdominal aortic aneurysm) ([place])   . Contrast media allergy   . Myocardial infarction  ([place]) 1981  . Hemorrhoid   . Ventricular tachycardia ([place])     a. appropriate ICD therpay b. on Sotalol    Past Surgical History  Procedure Laterality Date  . Cholecystectomy, laparoscopic  08/13/2009    Calculous cholecystitis  . Cardiac defibrillator placement  04/07/2008     STJ single chamber ICD implanted for secondary prevention  . Cardiac catheterization  04/04/2008     LAD stent patent, D1 OK, OM1 90/80/80% (74mm vessel), RCA 30%; EF 35%.  . Coronary angioplasty with stent placement  1999    PTCA and stenting of his left anterior descending artery  . Cholecystectomy    . Tonsillectomy    . Abdominal aortic endovascular stent graft N/A 12/06/2013    Procedure: ABDOMINAL AORTIC ENDOVASCULAR STENT GRAFT;  Surgeon: Rosetta Posner, MD;  Location: [facility];  Service: Vascular;  Laterality: N/A;  . Left heart catheterization with coronary angiogram N/A 02/28/2013    Procedure: LEFT HEART CATHETERIZATION WITH CORONARY ANGIOGRAM;  Surgeon: Sherren Mocha, MD;  Location: [facility] CATH LAB;  Service: Cardiovascular;  Laterality: N/A;     Current Outpatient Prescriptions  Medication Sig Dispense Refill  . aspirin 81 MG tablet Take 81 mg by mouth daily.     . Cholecalciferol (VITAMIN D-3 PO) Take 300 mg by mouth daily.     Marland Kitchen Fexofenadine HCl (ALLEGRA PO) Take 1 tablet by mouth daily.     . fish oil-omega-3 fatty acids 1000 MG capsule Take 1 g by mouth daily.     . Glucosamine Sulfate-MSM (GLUCOSAMINE-MSM DS PO) Take 1 tablet by mouth daily.    Marland Kitchen lisinopril (PRINIVIL,ZESTRIL) 5 MG tablet Take 1 tablet (5 mg total) by mouth daily. 90 tablet 3  . magnesium oxide (MAG-OX) 400 MG tablet Take 400 mg by mouth every other day.    . Multiple Vitamin (MULTIVITAMIN WITH MINERALS) TABS Take 1 tablet by mouth daily.    . nitroGLYCERIN (NITRODUR - DOSED IN MG/24 HR) 0.2 mg/hr patch Apply 1 patch onto skin  daily 90 patch 3  . nitroGLYCERIN (NITROSTAT) 0.4 MG SL tablet Place 0.4 mg under the tongue every 5 (five)  minutes as needed for chest pain ((MAX 3 DOSES)).    . rosuvastatin (CRESTOR) 5 MG tablet Take 5 mg by mouth every other day.    . sotalol (BETAPACE) 80 MG tablet Take 1 tablet (80 mg total) by mouth 2 (two) times daily. 60 tablet 0  . WELCHOL 625 MG tablet Take 1 tablet (625 mg  total) by mouth 2 (two)  times daily with a meal. 180 tablet 2   No current facility-administered medications for this visit.    Allergies:   Epinephrine; Losartan; Metoprolol; Mydriacyl; Phenylephrine hcl; Bisoprolol; Clopidogrel bisulfate; Iodine; and Statins    Social  History:  The patient  reports that he quit smoking about 36 years ago. His smoking use included Cigarettes. He has a 20 pack-year smoking history. He has never used smokeless tobacco. He reports that he does not drink alcohol or use illicit drugs.   Family History:  The patient's family history includes Heart disease in his father and mother; Hyperlipidemia in his father and mother; Mitral valve prolapse in his sister. There is no history of Heart attack or Hypertension.    ROS:  Please see the history of present illness.   Otherwise, review of systems are positive for back pain.   All other systems are reviewed and negative.    PHYSICAL EXAM: VS:  BP 130/80 mmHg  Pulse 64  Ht 5\' 9"  (1.753 m)  Wt 186 lb (84.369 kg)  BMI 27.45 kg/m2 , BMI Body mass index is 27.45 kg/(m^2). GEN: Well nourished, well developed, in no acute distress HEENT: normal Neck: no JVD, carotid bruits, or masses Cardiac: RRR; no murmurs, rubs, or gallops,no edema  Respiratory:  clear to auscultation bilaterally, normal work of breathing GI: soft, nontender, nondistended, + BS MS: no deformity or atrophy Skin: warm and dry, no rash Neuro:  Strength and sensation are intact Psych: euthymic mood, full affect    Recent Labs: 06/24/2015: TSH 1.963 08/12/2015: ALT 29; Hemoglobin 15.1; Platelets 144.0* 12/29/2015: BUN 18; Creat 0.93; Magnesium 2.0; Potassium 4.0; Sodium  140   Lipid Panel    Component Value Date/Time   CHOL 145 08/12/2015 0806   TRIG 202.0* 08/12/2015 0806   HDL 33.10* 08/12/2015 0806   CHOLHDL 4 08/12/2015 0806   VLDL 40.4* 08/12/2015 0806   LDLCALC 84 02/23/2015 0805   LDLDIRECT 80.0 08/12/2015 0806     Other studies Reviewed: Additional studies/ records that were reviewed today with results demonstrating: EF 40% in 2014.   ASSESSMENT AND PLAN:  1.  Ischemic heart disease/CAD/Old MI: status post remote posterolateral myocardial infarction in 1981. Status post angioplasty and stent of LAD in 1999.  EF 40%.  He is feeling fine off of the NTG patch.  2. ICD in place following successful out of hospital ventricular fibrillation arrest and resuscitation in 2009.  Follwed by Dr Caryl Comes. 3. AAA: status post stent graft surgery for abdominal aortic aneurysm December 2014. Dr. Donnetta Hutching. 4. HYPERlipidemia : on low-dose Crestor.  Lipids well controlled in 8/16. Recheck labs today.   5. essential hypertension. Blood pressure now normal on low-dose sotolol and lisinopril.  Continue current meds.  Should also improve with exercise and weight loss.  Exercise recs below.    Current medicines are reviewed at length with the patient today.  The patient concerns regarding his medicines were addressed.  The following changes have been made:  No change  Labs/ tests ordered today include: Cmet , lipids No orders of the defined types were placed in this encounter.    Recommend 150 minutes/week of aerobic exercise Low fat, low carb, high fiber diet recommended  Disposition:   FU in 4-5 months   Signed, Larae Grooms, MD  05/27/2016 9:24 AM    [facility] [address], [place], [place]  [address] Phone: [phone]; Fax: [phone]

## 2016-05-27 ENCOUNTER — Ambulatory Visit (INDEPENDENT_AMBULATORY_CARE_PROVIDER_SITE_OTHER): Payer: Medicare Other | Admitting: Interventional Cardiology

## 2016-05-27 ENCOUNTER — Encounter: Payer: Self-pay | Admitting: Interventional Cardiology

## 2016-05-27 VITALS — BP 130/80 | HR 64 | Ht 69.0 in | Wt 186.0 lb

## 2016-05-27 DIAGNOSIS — E782 Mixed hyperlipidemia: Secondary | ICD-10-CM

## 2016-05-27 DIAGNOSIS — I2510 Atherosclerotic heart disease of native coronary artery without angina pectoris: Secondary | ICD-10-CM

## 2016-05-27 DIAGNOSIS — I10 Essential (primary) hypertension: Secondary | ICD-10-CM | POA: Diagnosis not present

## 2016-05-27 DIAGNOSIS — I252 Old myocardial infarction: Secondary | ICD-10-CM | POA: Diagnosis not present

## 2016-05-27 LAB — COMPREHENSIVE METABOLIC PANEL
ALK PHOS: 54 U/L (ref 40–115)
ALT: 35 U/L (ref 9–46)
AST: 33 U/L (ref 10–35)
Albumin: 4.4 g/dL (ref 3.6–5.1)
BUN: 19 mg/dL (ref 7–25)
CALCIUM: 9.5 mg/dL (ref 8.6–10.3)
CHLORIDE: 103 mmol/L (ref 98–110)
CO2: 26 mmol/L (ref 20–31)
Creat: 0.95 mg/dL (ref 0.70–1.18)
GLUCOSE: 104 mg/dL — AB (ref 65–99)
POTASSIUM: 4.6 mmol/L (ref 3.5–5.3)
SODIUM: 138 mmol/L (ref 135–146)
Total Bilirubin: 0.6 mg/dL (ref 0.2–1.2)
Total Protein: 7 g/dL (ref 6.1–8.1)

## 2016-05-27 LAB — LIPID PANEL
CHOLESTEROL: 147 mg/dL (ref 125–200)
HDL: 39 mg/dL — ABNORMAL LOW (ref >=40)
LDL CALC: 75 mg/dL (ref <130)
Total CHOL/HDL Ratio: 3.8 Ratio (ref <=5.0)
Triglycerides: 166 mg/dL — ABNORMAL HIGH (ref <150)
VLDL: 33 mg/dL — AB (ref <30)

## 2016-05-27 NOTE — Patient Instructions (Signed)
**Note De-Identified Joel Lin Obfuscation** Medication Instructions:  Same-no changes  Labwork: Today-Lipids and CMET  Testing/Procedures: None  Follow-Up: Your physician wants you to follow-up in: 5 months. You will receive a reminder letter in the mail two months in advance. If you don't receive a letter, please call our office to schedule the follow-up appointment.     If you need a refill on your cardiac medications before your next appointment, please call your pharmacy.

## 2016-06-15 ENCOUNTER — Other Ambulatory Visit: Payer: Self-pay | Admitting: Nurse Practitioner

## 2016-07-11 ENCOUNTER — Encounter: Payer: Self-pay | Admitting: Internal Medicine

## 2016-07-11 ENCOUNTER — Ambulatory Visit (INDEPENDENT_AMBULATORY_CARE_PROVIDER_SITE_OTHER): Payer: Medicare Other | Admitting: Internal Medicine

## 2016-07-11 VITALS — BP 142/74 | HR 50 | Ht 69.0 in | Wt 187.8 lb

## 2016-07-11 DIAGNOSIS — I469 Cardiac arrest, cause unspecified: Secondary | ICD-10-CM | POA: Diagnosis not present

## 2016-07-11 DIAGNOSIS — I472 Ventricular tachycardia, unspecified: Secondary | ICD-10-CM

## 2016-07-11 DIAGNOSIS — I255 Ischemic cardiomyopathy: Secondary | ICD-10-CM

## 2016-07-11 DIAGNOSIS — Z95810 Presence of automatic (implantable) cardiac defibrillator: Secondary | ICD-10-CM

## 2016-07-11 LAB — MAGNESIUM: Magnesium: 2.2 mg/dL (ref 1.5–2.5)

## 2016-07-11 MED ORDER — NITROGLYCERIN 0.4 MG SL SUBL: 0.4000 mg | SUBLINGUAL_TABLET | SUBLINGUAL | 3 refills | Status: DC | PRN

## 2016-07-11 MED ORDER — CARVEDILOL 3.125 MG PO TABS: 3.1250 mg | ORAL_TABLET | Freq: Two times a day (BID) | ORAL | 11 refills | Status: DC

## 2016-07-11 NOTE — Patient Instructions (Signed)
Medication Instructions: - Your physician has recommended you make the following change in your medication:  1) Start coreg (carvedilol) 3.125 mg one tablet by mouth twice daily  Labwork: - Your physician recommends that you have lab work today: Magnesium  Procedures/Testing: - none  Follow-Up: - Remote monitoring is used to monitor your Pacemaker of ICD from home. This monitoring reduces the number of office visits required to check your device to one time per year. It allows [place] to keep an eye on the functioning of your device to ensure it is working properly. You are scheduled for a device check from home on 10/10/16. You may send your transmission at any time that day. If you have a wireless device, the transmission will be sent automatically. After your physician reviews your transmission, you will receive a postcard with your next transmission date.  - Your physician wants you to follow-up in: 6 months with Chanetta Marshall, NP for Dr. Caryl Comes & 1 year with Dr. Caryl Comes. You will receive a reminder letter in the mail two months in advance. If you don't receive a letter, please call our office to schedule the follow-up appointment.    Any Additional Special Instructions Will Be Listed Below (If Applicable).     If you need a refill on your cardiac medications before your next appointment, please call your pharmacy.

## 2016-07-11 NOTE — Progress Notes (Signed)
Patient Care Team: [name], MD as PCP - General (Internal Medicine) Darlin Coco, MD as Attending Physician (Cardiology) Deboraha Sprang, MD as Attending Physician (Cardiology)   HPI  Joel Lin is a 75 y.o. male is seen in followup for aborted sudden cardiac death in the setting of ischemic heart disease with  prior PCI and mild depression of LV systolic function. He is status post ICD implantation.   He was hospitalized 7/16 with ventricular tachycardia failed termination with ATP and for which he was shocked. A second episode of monomorphic ventricular tachycardia terminated on its own. He was started on sotalol and his atenolol was discontinued.     DATE TEST    11/12    myoview   EF 45 % Old inferolateral   1/14    echo   EF 40 %     6/17  K  4.6 creatinine 0.95  He feels better than he has in a long time. He is walking a mile and a half a day. He has no chest pain lightheadedness or shortness of breath and was is quite humid. He has stopped his nitroglycerin   He underwent stent grafting of the abdominal aortic aneurysm  He has a history of prior inappropriate ICD discharges secondary to sinus tachycardia  He also has in 2013 ventricular tachycardia terminated with antitachycardia pacing;  this was unassociated with symptoms and was treated with increased dose of beta blockers      Past Medical History:  Diagnosis Date  . AAA (abdominal aortic aneurysm) ([place])   . Cardiac arrest ([place]) 03/29/2008   a. resuscitated OOH VF arrest  b. s/p STJ ICD  . Contrast media allergy   . Coronary artery disease    myoview 2011>>EF of 53% / large area of old inferolateral infarct but no reversible ischemia          . Hemorrhoid   . Hypercoagulable state ([place])    Questionable history of a borderline hypercoagulable state  . Hyperlipidemia   . Myocardial infarction ([place]) 1981  . Peripheral vascular disease ([place])   . PVCs (premature ventricular contractions)     occasionally  . Ventricular tachycardia ([place])    a. appropriate ICD therpay b. on Sotalol    Past Surgical History:  Procedure Laterality Date  . ABDOMINAL AORTIC ENDOVASCULAR STENT GRAFT N/A 12/06/2013   Procedure: ABDOMINAL AORTIC ENDOVASCULAR STENT GRAFT;  Surgeon: Rosetta Posner, MD;  Location: [facility];  Service: Vascular;  Laterality: N/A;  . CARDIAC CATHETERIZATION  04/04/2008    LAD stent patent, D1 OK, OM1 90/80/80% (67mm vessel), RCA 30%; EF 35%.  Marland Kitchen CARDIAC DEFIBRILLATOR PLACEMENT  04/07/2008    STJ single chamber ICD implanted for secondary prevention  . CHOLECYSTECTOMY    . CHOLECYSTECTOMY, LAPAROSCOPIC  08/13/2009   Calculous cholecystitis  . CORONARY ANGIOPLASTY WITH STENT PLACEMENT  1999   PTCA and stenting of his left anterior descending artery  . LEFT HEART CATHETERIZATION WITH CORONARY ANGIOGRAM N/A 02/28/2013   Procedure: LEFT HEART CATHETERIZATION WITH CORONARY ANGIOGRAM;  Surgeon: Sherren Mocha, MD;  Location: [facility] CATH LAB;  Service: Cardiovascular;  Laterality: N/A;  . TONSILLECTOMY      Current Outpatient Prescriptions  Medication Sig Dispense Refill  . aspirin 81 MG tablet Take 81 mg by mouth daily.     . Cholecalciferol (VITAMIN D-3 PO) Take 300 mg by mouth daily.     Marland Kitchen Fexofenadine HCl (ALLEGRA PO) Take 1 tablet by mouth daily.     Marland Kitchen  fish oil-omega-3 fatty acids 1000 MG capsule Take 1 g by mouth daily.     . Glucosamine Sulfate-MSM (GLUCOSAMINE-MSM DS PO) Take 1 tablet by mouth daily.    Marland Kitchen lisinopril (PRINIVIL,ZESTRIL) 5 MG tablet Take 1 tablet (5 mg total) by mouth daily. 90 tablet 3  . magnesium oxide (MAG-OX) 400 MG tablet Take 400 mg by mouth every other day.    . Multiple Vitamin (MULTIVITAMIN WITH MINERALS) TABS Take 1 tablet by mouth daily.    . nitroGLYCERIN (NITRODUR - DOSED IN MG/24 HR) 0.2 mg/hr patch Apply 1 patch onto skin  daily 90 patch 3  . nitroGLYCERIN (NITROSTAT) 0.4 MG SL tablet Place 0.4 mg under the tongue every 5 (five) minutes as needed  for chest pain ((MAX 3 DOSES)).    . rosuvastatin (CRESTOR) 5 MG tablet Take 5 mg by mouth every other day.    . sotalol (BETAPACE) 80 MG tablet Take 1 tablet by mouth two  times daily 180 tablet 3  . WELCHOL 625 MG tablet Take 1 tablet (625 mg  total) by mouth 2 (two)  times daily with a meal. 180 tablet 2   No current facility-administered medications for this visit.     Allergies  Allergen Reactions  . Epinephrine Other (See Comments)    Elevated heartbeat  . Losartan Palpitations and Other (See Comments)    Irregular heartbeat  . Metoprolol Other (See Comments)    Irregular heartbeat  . Mydriacyl [Tropicamide] Other (See Comments)    Elevated heartbeat  . Phenylephrine Hcl Other (See Comments)    Increased heartrate  . Bisoprolol Other (See Comments)    Feels it does not agree with him  . Clopidogrel Bisulfate Hives       . Iodine Hives    "Renografin"  . Statins Other (See Comments)    Muscle cramps (crestor low dose is ok)    Review of Systems negative except from HPI and PMH  Physical Exam There were no vitals taken for this visit. Well developed and well nourished in no acute distress HENT normal E scleral and icterus clear Neck Supple JVP flat; carotids brisk and full Clear to ausculation Device pocket well healed; without hematoma or erythema.  There is no tethering Regular rate and rhythm, no murmurs gallops or rub Soft with active bowel sounds No clubbing cyanosis none Edema Alert and oriented, grossly normal motor and sensory function Skin Warm and Dry   ECG sinus rhythm at  56 Intervals 23/12/43 Axis (-78  Inferolateral infarct  Assessment and  Plan  Aborted cardiac arrest  Ischemic Cardiomyopathy  Ventricular tachycardia  Implantable defibrillator The patient's device was interrogated.  The information was reviewed. No changes were made in the programming.     He is stable Without symptoms of ischemia  .No intercurrent Ventricular  tachycardia  Will continue sotalol for VT  We'll try a low dose beta blocker. I'm a little bit concerned about his slow heart rate. He may not tolerated.

## 2016-07-12 ENCOUNTER — Encounter: Payer: Self-pay | Admitting: Internal Medicine

## 2016-07-13 LAB — CUP PACEART INCLINIC DEVICE CHECK
Battery Remaining Longevity: 7.3
Date Time Interrogation Session: 20170731122112
HIGH POWER IMPEDANCE MEASURED VALUE: 51.1875
Lead Channel Pacing Threshold Amplitude: 1.25 V
Lead Channel Pacing Threshold Amplitude: 1.25 V
Lead Channel Sensing Intrinsic Amplitude: 7.5 mV
MDC IDC LEAD IMPLANT DT: 20090427
MDC IDC LEAD LOCATION: 753860
MDC IDC LEAD MODEL: 7121
MDC IDC MSMT BATTERY VOLTAGE: 2.51 V
MDC IDC MSMT LEADCHNL RV IMPEDANCE VALUE: 300 Ohm
MDC IDC MSMT LEADCHNL RV PACING THRESHOLD PULSEWIDTH: 0.6 ms
MDC IDC MSMT LEADCHNL RV PACING THRESHOLD PULSEWIDTH: 0.6 ms
MDC IDC PG SERIAL: 549899
MDC IDC SET LEADCHNL RV PACING AMPLITUDE: 2.5 V
MDC IDC SET LEADCHNL RV PACING PULSEWIDTH: 0.6 ms
MDC IDC SET LEADCHNL RV SENSING SENSITIVITY: 0.3 mV
MDC IDC STAT BRADY RV PERCENT PACED: 0.67 %

## 2016-07-18 ENCOUNTER — Other Ambulatory Visit: Payer: Self-pay | Admitting: *Deleted

## 2016-07-18 MED ORDER — ROSUVASTATIN CALCIUM 5 MG PO TABS: 5.0000 mg | ORAL_TABLET | Freq: Every day | ORAL | 3 refills | Status: DC

## 2016-07-29 ENCOUNTER — Other Ambulatory Visit: Payer: Self-pay | Admitting: *Deleted

## 2016-07-29 NOTE — Telephone Encounter (Signed)
Please refer to the pts PCP. Thanks. 

## 2016-07-29 NOTE — Telephone Encounter (Signed)
Previously filled by Dr Mare Ferrari. Ok to refill? Please advise. Thanks, MI

## 2016-08-02 ENCOUNTER — Ambulatory Visit: Payer: Medicare Other | Admitting: Family

## 2016-08-02 ENCOUNTER — Other Ambulatory Visit (HOSPITAL_COMMUNITY): Payer: Medicare Other

## 2016-08-03 ENCOUNTER — Other Ambulatory Visit: Payer: Self-pay

## 2016-08-03 MED ORDER — COLESEVELAM HCL 625 MG PO TABS: ORAL_TABLET | ORAL | 2 refills | Status: DC

## 2016-08-08 ENCOUNTER — Other Ambulatory Visit: Payer: Self-pay | Admitting: *Deleted

## 2016-08-08 MED ORDER — CARVEDILOL 3.125 MG PO TABS: 3.1250 mg | ORAL_TABLET | Freq: Two times a day (BID) | ORAL | 2 refills | Status: DC

## 2016-08-25 ENCOUNTER — Encounter: Payer: Self-pay | Admitting: Family

## 2016-08-27 ENCOUNTER — Other Ambulatory Visit: Payer: Self-pay | Admitting: *Deleted

## 2016-08-27 DIAGNOSIS — I714 Abdominal aortic aneurysm, without rupture, unspecified: Secondary | ICD-10-CM

## 2016-08-27 DIAGNOSIS — Z48812 Encounter for surgical aftercare following surgery on the circulatory system: Secondary | ICD-10-CM

## 2016-08-30 ENCOUNTER — Encounter: Payer: Self-pay | Admitting: Family

## 2016-08-30 ENCOUNTER — Ambulatory Visit (INDEPENDENT_AMBULATORY_CARE_PROVIDER_SITE_OTHER): Payer: Medicare Other | Admitting: Family

## 2016-08-30 ENCOUNTER — Ambulatory Visit (HOSPITAL_COMMUNITY)
Admission: RE | Admit: 2016-08-30 | Discharge: 2016-08-30 | Disposition: A | Discharge status: Home / Self Care | Payer: Medicare Other | Source: Ambulatory Visit | Attending: Family | Admitting: Family

## 2016-08-30 VITALS — BP 136/80 | HR 49 | Ht 69.0 in | Wt 185.4 lb

## 2016-08-30 DIAGNOSIS — Z48812 Encounter for surgical aftercare following surgery on the circulatory system: Secondary | ICD-10-CM

## 2016-08-30 DIAGNOSIS — I714 Abdominal aortic aneurysm, without rupture, unspecified: Secondary | ICD-10-CM

## 2016-08-30 DIAGNOSIS — Z95828 Presence of other vascular implants and grafts: Secondary | ICD-10-CM | POA: Diagnosis not present

## 2016-08-30 NOTE — Progress Notes (Signed)
[facility] [place]  CC: Follow up s/p EVAR  History of Present Illness  Joel Lin is a 75 y.o. (1941/04/04) male who returns today for follow-up of stent graft repair of infrarenal abdominal aortic aneurysm by Dr. Donnetta Hutching on 12/06/2013.   He is here today with his wife. He does report that his defibrillator fired for V. tach in March 2016 and July 2016. He has had some adjustment in his medical treatment. He has no symptoms referable to his aneurysm. Dr. Donnetta Hutching last saw pt on 07/28/15. At that time maximal diameter by duplex decreased to 4.8 cm from 5.1 cm. He takes  Daily 81 mg ASA, a statin, and a beta blocker. He walks 1.5 miles every morning.  He denies any hx of claudication with walking, denies a hx of stroke or TIA.  He denies any light-headedness, denies chest pain, denies dyspnea.  Pt Diabetic: No Pt smoker: former smoker, quit in 1981, smoked x 20 years   Past Medical History:  Diagnosis Date  . AAA (abdominal aortic aneurysm) ([place])   . Cardiac arrest ([place]) 03/29/2008   a. resuscitated OOH VF arrest  b. s/p STJ ICD  . Contrast media allergy   . Coronary artery disease    myoview 2011>>EF of 53% / large area of old inferolateral infarct but no reversible ischemia          . Hemorrhoid   . Hypercoagulable state ([place])    Questionable history of a borderline hypercoagulable state  . Hyperlipidemia   . Myocardial infarction ([place]) 1981  . Peripheral vascular disease ([place])   . PVCs (premature ventricular contractions)    occasionally  . Ventricular tachycardia ([place])    a. appropriate ICD therpay b. on Sotalol   Past Surgical History:  Procedure Laterality Date  . ABDOMINAL AORTIC ENDOVASCULAR STENT GRAFT N/A 12/06/2013   Procedure: ABDOMINAL AORTIC ENDOVASCULAR STENT GRAFT;  Surgeon: Rosetta Posner, MD;  Location: [facility];  Service: Vascular;  Laterality: N/A;  . CARDIAC CATHETERIZATION  04/04/2008    LAD stent patent, D1 OK, OM1 90/80/80% (16mm  vessel), RCA 30%; EF 35%.  Marland Kitchen CARDIAC DEFIBRILLATOR PLACEMENT  04/07/2008    STJ single chamber ICD implanted for secondary prevention  . CHOLECYSTECTOMY    . CHOLECYSTECTOMY, LAPAROSCOPIC  08/13/2009   Calculous cholecystitis  . CORONARY ANGIOPLASTY WITH STENT PLACEMENT  1999   PTCA and stenting of his left anterior descending artery  . LEFT HEART CATHETERIZATION WITH CORONARY ANGIOGRAM N/A 02/28/2013   Procedure: LEFT HEART CATHETERIZATION WITH CORONARY ANGIOGRAM;  Surgeon: Sherren Mocha, MD;  Location: [facility] CATH LAB;  Service: Cardiovascular;  Laterality: N/A;  . TONSILLECTOMY     Social History Social History  Substance Use Topics  . Smoking status: Former Smoker    Packs/day: 1.00    Years: 20.00    Types: Cigarettes    Quit date: 12/23/1979  . Smokeless tobacco: Never Used  . Alcohol use No   Family History Family History  Problem Relation Age of Onset  . Hyperlipidemia Mother   . Heart disease Mother   . Heart disease Father     before age 60  . Hyperlipidemia Father   . Mitral valve prolapse Sister   . Stomach cancer    . Heart attack Neg Hx   . Hypertension Neg Hx    Current Outpatient Prescriptions on File Prior to Visit  Medication Sig Dispense Refill  . aspirin 81 MG tablet Take 81 mg by mouth daily.     Marland Kitchen  carvedilol (COREG) 3.125 MG tablet Take 1 tablet (3.125 mg total) by mouth 2 (two) times daily. 180 tablet 2  . Cholecalciferol (VITAMIN D-3 PO) Take 300 mg by mouth daily.     . colesevelam (WELCHOL) 625 MG tablet Take 1 tablet (625 mg  total) by mouth 2 (two)  times daily with a meal. 180 tablet 2  . Fexofenadine HCl (ALLEGRA PO) Take 1 tablet by mouth daily.     . fish oil-omega-3 fatty acids 1000 MG capsule Take 1 g by mouth daily.     . Glucosamine Sulfate-MSM (GLUCOSAMINE-MSM DS PO) Take 1 tablet by mouth daily.    Marland Kitchen lisinopril (PRINIVIL,ZESTRIL) 5 MG tablet Take 1 tablet (5 mg total) by mouth daily. 90 tablet 3  . magnesium oxide (MAG-OX) 400 MG tablet  Take 400 mg by mouth every other day.    . Multiple Vitamin (MULTIVITAMIN WITH MINERALS) TABS Take 1 tablet by mouth daily.    . nitroGLYCERIN (NITROSTAT) 0.4 MG SL tablet Place 1 tablet (0.4 mg total) under the tongue every 5 (five) minutes as needed for chest pain ((MAX 3 DOSES)). 25 tablet 3  . rosuvastatin (CRESTOR) 5 MG tablet Take 1 tablet (5 mg total) by mouth daily. 90 tablet 3  . sotalol (BETAPACE) 80 MG tablet Take 1 tablet by mouth two  times daily 180 tablet 3   No current facility-administered medications on file prior to visit.    Allergies  Allergen Reactions  . Epinephrine Other (See Comments)    Elevated heartbeat  . Losartan Palpitations and Other (See Comments)    Irregular heartbeat  . Metoprolol Other (See Comments)    Irregular heartbeat  . Mydriacyl [Tropicamide] Other (See Comments)    Elevated heartbeat  . Phenylephrine Hcl Other (See Comments)    Increased heartrate  . Bisoprolol Other (See Comments)    Feels it does not agree with him  . Clopidogrel Bisulfate Hives       . Iodine Hives    "Renografin"  . Statins Other (See Comments)    Muscle cramps (crestor low dose is ok)     ROS: See HPI for pertinent positives and negatives.  Physical Examination  Vitals:   08/30/16 0849  BP: 136/80  Pulse: (!) 49  SpO2: 98%  Weight: 185 lb 6.4 oz (84.1 kg)  Height: 5\' 9"  (1.753 m)   Body mass index is 27.38 kg/m.  General: A&O x 3, WD, male.  Pulmonary: Sym exp, respirations are non labored, good air movt, CTAB, no rales, rhonchi, or wheezing.  Cardiac: Regular rhythm, bradycardic, Nl S1, S2, no murmur appreciated. Pacemaker/defibrillator subcutaneous left upper chest.  Vascular: Vessel Right Left  Radial Palpable Palpable  Carotid  without bruit  without bruit  Aorta Not palpable N/A  Femoral Palpable Palpable  Popliteal Not palpable Not palpable  PT Palpable Palpable  DP Palpable Palpable   Gastrointestinal: soft, NTND, -G/R, - HSM, -  palpable masses, - CVAT B.  Musculoskeletal: M/S 5/5 throughout, extremities without ischemic changes.  Neurologic: Pain and light touch intact in extremities, Motor exam as listed above  Non-Invasive Vascular Imaging  EVAR Duplex (Date: 08/30/16)  AAA sac size: 4.02 cm x 3.58 cm, normal diameters of CIA's  no endoleak detected  Previous (07/20/15): 4.78 cm    Medical Decision Making  Joel Lin is a 75 y.o. male who presents s/p EVAR (Date: 12/06/13).  Pt is asymptomatic with decreased sac size.  I discussed with the patient the importance  of surveillance of the endograft.  The next endograft duplex will be scheduled for 12 months.  The patient will follow up with [place] in 12 months with these studies.  I emphasized the importance of maximal medical management including strict control of blood pressure, blood glucose, and lipid levels, antiplatelet agents, obtaining regular exercise, and cessation of smoking.   Thank you for allowing [place] to participate in this patient's care.  Clemon Chambers, RN, MSN, FNP-C [facility] [place] Office: [phone]  Clinic Physician: Early  08/30/2016, 8:51 AM

## 2016-09-05 ENCOUNTER — Other Ambulatory Visit: Payer: Self-pay | Admitting: Interventional Cardiology

## 2016-09-05 MED ORDER — ROSUVASTATIN CALCIUM 5 MG PO TABS: 5.0000 mg | ORAL_TABLET | Freq: Every day | ORAL | 3 refills | Status: DC

## 2016-10-04 ENCOUNTER — Other Ambulatory Visit: Payer: Self-pay | Admitting: Internal Medicine

## 2016-10-10 ENCOUNTER — Ambulatory Visit (INDEPENDENT_AMBULATORY_CARE_PROVIDER_SITE_OTHER): Payer: Medicare Other | Admitting: *Deleted

## 2016-10-10 ENCOUNTER — Telehealth: Payer: Self-pay | Admitting: Cardiology

## 2016-10-10 DIAGNOSIS — I255 Ischemic cardiomyopathy: Secondary | ICD-10-CM | POA: Diagnosis not present

## 2016-10-10 NOTE — Telephone Encounter (Signed)
Confirmed remote transmission w/ pt wife.   

## 2016-10-11 ENCOUNTER — Encounter: Payer: Self-pay | Admitting: Interventional Cardiology

## 2016-10-18 NOTE — Progress Notes (Signed)
Remote ICD transmission.   

## 2016-10-19 ENCOUNTER — Encounter: Payer: Self-pay | Admitting: Cardiology

## 2016-10-20 ENCOUNTER — Other Ambulatory Visit: Payer: Self-pay | Admitting: *Deleted

## 2016-10-20 MED ORDER — SOTALOL HCL 80 MG PO TABS: 80.0000 mg | ORAL_TABLET | Freq: Two times a day (BID) | ORAL | 3 refills | Status: DC

## 2016-10-20 NOTE — Progress Notes (Signed)
ERROR WITH OPENING THIS  ENCOUNTER

## 2016-10-24 NOTE — Progress Notes (Signed)
Cardiology Office Note   Date:  10/25/2016   ID:  Joel Lin, DOB 1941-07-25, MRN [id number]  PCP:  Kandice Hams, MD    No chief complaint on file. CAD   Wt Readings from Last 3 Encounters:  10/25/16 83.3 kg (183 lb 9.6 oz)  08/30/16 84.1 kg (185 lb 6.4 oz)  07/11/16 85.2 kg (187 lb 12.8 oz)       History of Present Illness: Joel Lin is a 75 y.o. male  He has a history of known ischemic heart disease. He has a history of a remote myocardial infarction at the age of 36. That was a large posterolateral myocardial infarction in 1981 in [place].  He moved to [place] in 1985.  The patient had angioplasty and stent of his LAD in 1999- Dr. Acie Fredrickson. In 2009 he had an out of hospital cardiac arrest at home was successfully resuscitated and treated with hypothermia without significant neurologic sequelae. No CAD by cath at that time.  He now has a defibrillator in place and is followed by Dr. Caryl Comes. He has a history of hypercholesterolemia. His last nuclear stress test was 10/24/11 and showed an ejection fraction of 45% and a large posterolateral scar but no ischemia. His last echocardiogram was in 2014 and showed an ejection fraction of 40%. On 12/06/13 the patient underwent a stent graft for his abdominal aortic aneurysm. The patient did well and was discharged without complications. The patient has his defibrillator monitored through Dr. Olin Pia office. In January 2013 he had 2 episodes of ventricular tachycardia which were asymptomatic. In March 2014 he was rehospitalized after having 3 consecutive shocks from his defibrillator. He underwent cardiac catheterization by Dr. Burt Knack on 02/28/13 who told him that his arteries have not shown any progression of atherosclerosis since his previous cardiac catheterization in 2009.   On 02/10/15 the patient presented to [facility] ED due to ICD shock. He stated he was playing golf and just finished 9 holes and was starting on the back 9  when he suddenly felt dizzy. The next thing he knows his device shocked him. He called 911 and went back to the clubhouse where EMS arrived and gave him four aspirin.  Pt was transferred to [facility] for EP eval. He did well overnight without any ICD shocks. Troponin was negative. He was seen by Dr. Caryl Comes and found stable for discharge. He had been placed on po amiodarone but Dr. Caryl Comes held on initiating amiodarone given relative infrequency of V tach.  AAA repair in 12/14, stent graft with Dr. Donnetta Hutching.  His most recent hospitalization following a ICD discharge was on 06/24/15. During that admission his atenolol was stopped and he was switched to sotalol initially 80 mg twice a day and subsequently increased to 120 mg twice a day. More recently the dose was decreased again to 80 mg twice a day. He has had no further discharges from his defibrillator. Dr. Caryl Comes recently had him walk on the treadmill. The patient reports that he was able to get his heart rate up to 114.  He reports some upper back pain.  His angina in the past has been very typical.  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  He has some pain from his AICD.  Since sotalol started, no AICD shocks. Tolerates 80 mg BID. Bradycardiac at home.    He does not walk that much.  He also wants to lose wieght.    He stopped his NTG patches due to skin irritation in  early 2017.  There was no change in sx.  He had been using them for years.   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  THis pain is different than his angina.  It occurs with certain head movements.  He is walking more since the last visit.  No anginal pain.  He will need a generator change next year. BP readings at home are in the 0000000 range systolic.   Feels better after starting carvedilol.    Past Medical History:  Diagnosis Date  . AAA (abdominal aortic aneurysm) ([place])   . Cardiac arrest ([place]) 03/29/2008   a. resuscitated OOH VF arrest  b. s/p STJ ICD  .  Contrast media allergy   . Coronary artery disease    myoview 2011>>EF of 53% / large area of old inferolateral infarct but no reversible ischemia          . Hemorrhoid   . Hypercoagulable state ([place])    Questionable history of a borderline hypercoagulable state  . Hyperlipidemia   . Myocardial infarction 1981  . Peripheral vascular disease ([place])   . PVCs (premature ventricular contractions)    occasionally  . Ventricular tachycardia ([place])    a. appropriate ICD therpay b. on Sotalol    Past Surgical History:  Procedure Laterality Date  . ABDOMINAL AORTIC ENDOVASCULAR STENT GRAFT N/A 12/06/2013   Procedure: ABDOMINAL AORTIC ENDOVASCULAR STENT GRAFT;  Surgeon: Rosetta Posner, MD;  Location: [facility];  Service: Vascular;  Laterality: N/A;  . CARDIAC CATHETERIZATION  04/04/2008    LAD stent patent, D1 OK, OM1 90/80/80% (85mm vessel), RCA 30%; EF 35%.  Marland Kitchen CARDIAC DEFIBRILLATOR PLACEMENT  04/07/2008    STJ single chamber ICD implanted for secondary prevention  . CHOLECYSTECTOMY    . CHOLECYSTECTOMY, LAPAROSCOPIC  08/13/2009   Calculous cholecystitis  . CORONARY ANGIOPLASTY WITH STENT PLACEMENT  1999   PTCA and stenting of his left anterior descending artery  . LEFT HEART CATHETERIZATION WITH CORONARY ANGIOGRAM N/A 02/28/2013   Procedure: LEFT HEART CATHETERIZATION WITH CORONARY ANGIOGRAM;  Surgeon: Sherren Mocha, MD;  Location: [facility] CATH LAB;  Service: Cardiovascular;  Laterality: N/A;  . TONSILLECTOMY       Current Outpatient Prescriptions  Medication Sig Dispense Refill  . aspirin 81 MG tablet Take 81 mg by mouth daily.     . carvedilol (COREG) 3.125 MG tablet Take 1 tablet (3.125 mg total) by mouth 2 (two) times daily. 180 tablet 2  . Cholecalciferol (VITAMIN D-3 PO) Take 300 mg by mouth daily.     . colesevelam (WELCHOL) 625 MG tablet Take 1 tablet (625 mg  total) by mouth 2 (two)  times daily with a meal. 180 tablet 2  . Fexofenadine HCl (ALLEGRA PO) Take 1 tablet by mouth daily.     .  fish oil-omega-3 fatty acids 1000 MG capsule Take 1 g by mouth daily.     . Glucosamine Sulfate-MSM (GLUCOSAMINE-MSM DS PO) Take 1 tablet by mouth daily.    Marland Kitchen ibuprofen (ADVIL,MOTRIN) 200 MG tablet Take 200 mg by mouth every 6 (six) hours as needed (pain).    Marland Kitchen lisinopril (PRINIVIL,ZESTRIL) 5 MG tablet TAKE 1 TABLET BY MOUTH  DAILY 90 tablet 1  . magnesium oxide (MAG-OX) 400 MG tablet Take 400 mg by mouth every other day.    . meloxicam (MOBIC) 15 MG tablet Take 1 tablet by mouth daily.    . Multiple Vitamin (MULTIVITAMIN WITH MINERALS) TABS Take 1 tablet by mouth daily.    Marland Kitchen  nitroGLYCERIN (NITROSTAT) 0.4 MG SL tablet Place 1 tablet (0.4 mg total) under the tongue every 5 (five) minutes as needed for chest pain ((MAX 3 DOSES)). 25 tablet 3  . rosuvastatin (CRESTOR) 5 MG tablet Take 1 tablet (5 mg total) by mouth daily. 90 tablet 3  . sotalol (BETAPACE) 80 MG tablet Take 1 tablet (80 mg total) by mouth 2 (two) times daily. 180 tablet 3   No current facility-administered medications for this visit.     Allergies:   Epinephrine; Losartan; Metoprolol; Mydriacyl [tropicamide]; Phenylephrine hcl; Bisoprolol; Clopidogrel bisulfate; Iodine; and Statins    Social History:  The patient  reports that he quit smoking about 36 years ago. His smoking use included Cigarettes. He has a 20.00 pack-year smoking history. He has never used smokeless tobacco. He reports that he does not drink alcohol or use drugs.   Family History:  The patient's family history includes Heart disease in his father and mother; Hyperlipidemia in his father and mother; Mitral valve prolapse in his sister.    ROS:  Please see the history of present illness.   Otherwise, review of systems are positive for back pain.   All other systems are reviewed and negative.    PHYSICAL EXAM: VS:  BP 132/84   Pulse (!) 55   Ht 5' 8.5" (1.74 m)   Wt 83.3 kg (183 lb 9.6 oz)   BMI 27.51 kg/m  , BMI Body mass index is 27.51 kg/m. GEN: Well  nourished, well developed, in no acute distress  HEENT: normal  Neck: no JVD, carotid bruits, or masses Cardiac: RRR; no murmurs, rubs, or gallops,no edema  Respiratory:  clear to auscultation bilaterally, normal work of breathing GI: soft, nontender, nondistended, + BS MS: no deformity or atrophy  Skin: warm and dry, no rash Neuro:  Strength and sensation are intact Psych: euthymic mood, full affect    Recent Labs: 05/27/2016: ALT 35; BUN 19; Creat 0.95; Potassium 4.6; Sodium 138 07/11/2016: Magnesium 2.2   Lipid Panel    Component Value Date/Time   CHOL 147 05/27/2016 0939   TRIG 166 (H) 05/27/2016 0939   HDL 39 (L) 05/27/2016 0939   CHOLHDL 3.8 05/27/2016 0939   VLDL 33 (H) 05/27/2016 0939   LDLCALC 75 05/27/2016 0939   LDLDIRECT 80.0 08/12/2015 0806     Other studies Reviewed: Additional studies/ records that were reviewed today with results demonstrating: EF 40% in 2014.   ASSESSMENT AND PLAN:  1.  Ischemic heart disease/CAD/Old MI: status post remote posterolateral myocardial infarction in 1981. Status post angioplasty and stent of LAD in 1999.  EF 40%.  He is feeling fine off of the NTG patch.  He has restarted the NTG patch to complete his supply. 2. ICD in place following successful out of hospital ventricular fibrillation arrest and resuscitation in 2009.  Follwed by Dr Caryl Comes. 3. AAA: status post stent graft surgery for abdominal aortic aneurysm December 2014. Dr. Donnetta Hutching. 4. HYPERlipidemia : on low-dose Crestor.  Lipids well controlled in 8/16. Recheck labs today.   5. essential hypertension. Blood pressure now normal on low-dose sotolol and lisinopril.  Continue current meds.  Should also improve with exercise and weight loss.  Exercise recs below.    Current medicines are reviewed at length with the patient today.  The patient concerns regarding his medicines were addressed.  The following changes have been made:  No change  Labs/ tests ordered today include:  Cmet , lipids No orders of the defined types  were placed in this encounter.   Recommend 150 minutes/week of aerobic exercise Low fat, low carb, high fiber diet recommended  Disposition:   FU in 4-5 months   Signed, Larae Grooms, MD  10/25/2016 9:05 AM    [facility] [place], [place], [place]  [address] Phone: [phone]; Fax: [phone]

## 2016-10-25 ENCOUNTER — Encounter: Payer: Self-pay | Admitting: Interventional Cardiology

## 2016-10-25 ENCOUNTER — Ambulatory Visit (INDEPENDENT_AMBULATORY_CARE_PROVIDER_SITE_OTHER): Payer: Medicare Other | Admitting: Interventional Cardiology

## 2016-10-25 VITALS — BP 132/84 | HR 55 | Ht 68.5 in | Wt 183.6 lb

## 2016-10-25 DIAGNOSIS — I4729 Other ventricular tachycardia: Secondary | ICD-10-CM

## 2016-10-25 DIAGNOSIS — Z95810 Presence of automatic (implantable) cardiac defibrillator: Secondary | ICD-10-CM | POA: Diagnosis not present

## 2016-10-25 DIAGNOSIS — I2510 Atherosclerotic heart disease of native coronary artery without angina pectoris: Secondary | ICD-10-CM | POA: Diagnosis not present

## 2016-10-25 DIAGNOSIS — I472 Ventricular tachycardia: Secondary | ICD-10-CM

## 2016-10-25 DIAGNOSIS — I4901 Ventricular fibrillation: Secondary | ICD-10-CM

## 2016-10-25 NOTE — Patient Instructions (Signed)
Medication Instructions:  Same-no changes  Labwork: None  Testing/Procedures: None  Follow-Up: Your physician wants you to follow-up in: 6 months. You will receive a reminder letter in the mail two months in advance. If you don't receive a letter, please call our office to schedule the follow-up appointment.      If you need a refill on your cardiac medications before your next appointment, please call your pharmacy.   

## 2016-10-26 IMAGING — CR DG CHEST 2V
2 series · 2 of 2 positions shown · non-contrast
Comparison: 02/21/2015

CLINICAL DATA: Defibrillator fired.  History of cardiac arrest.

EXAM:
CHEST  2 VIEW

[chest pa]
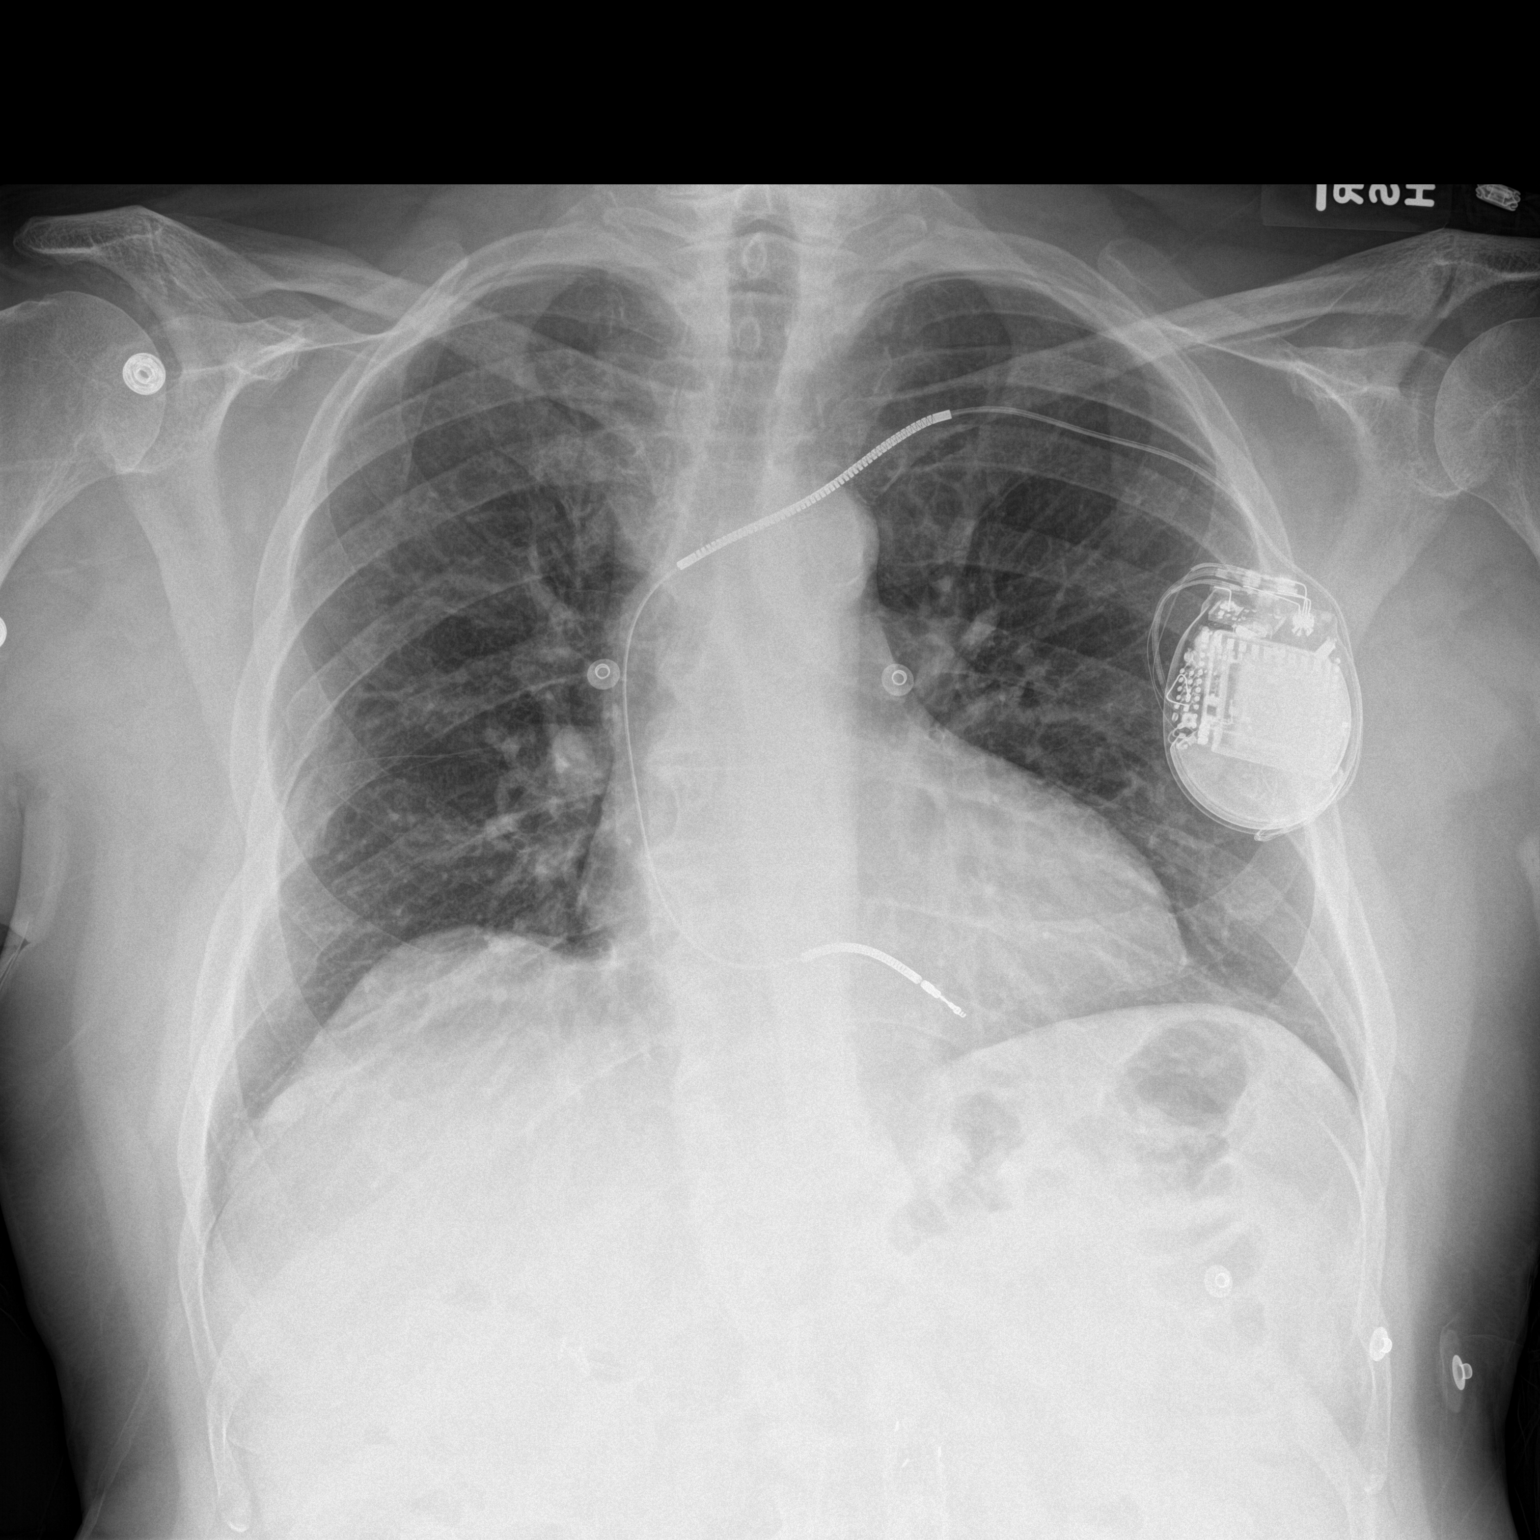

[chest lat]
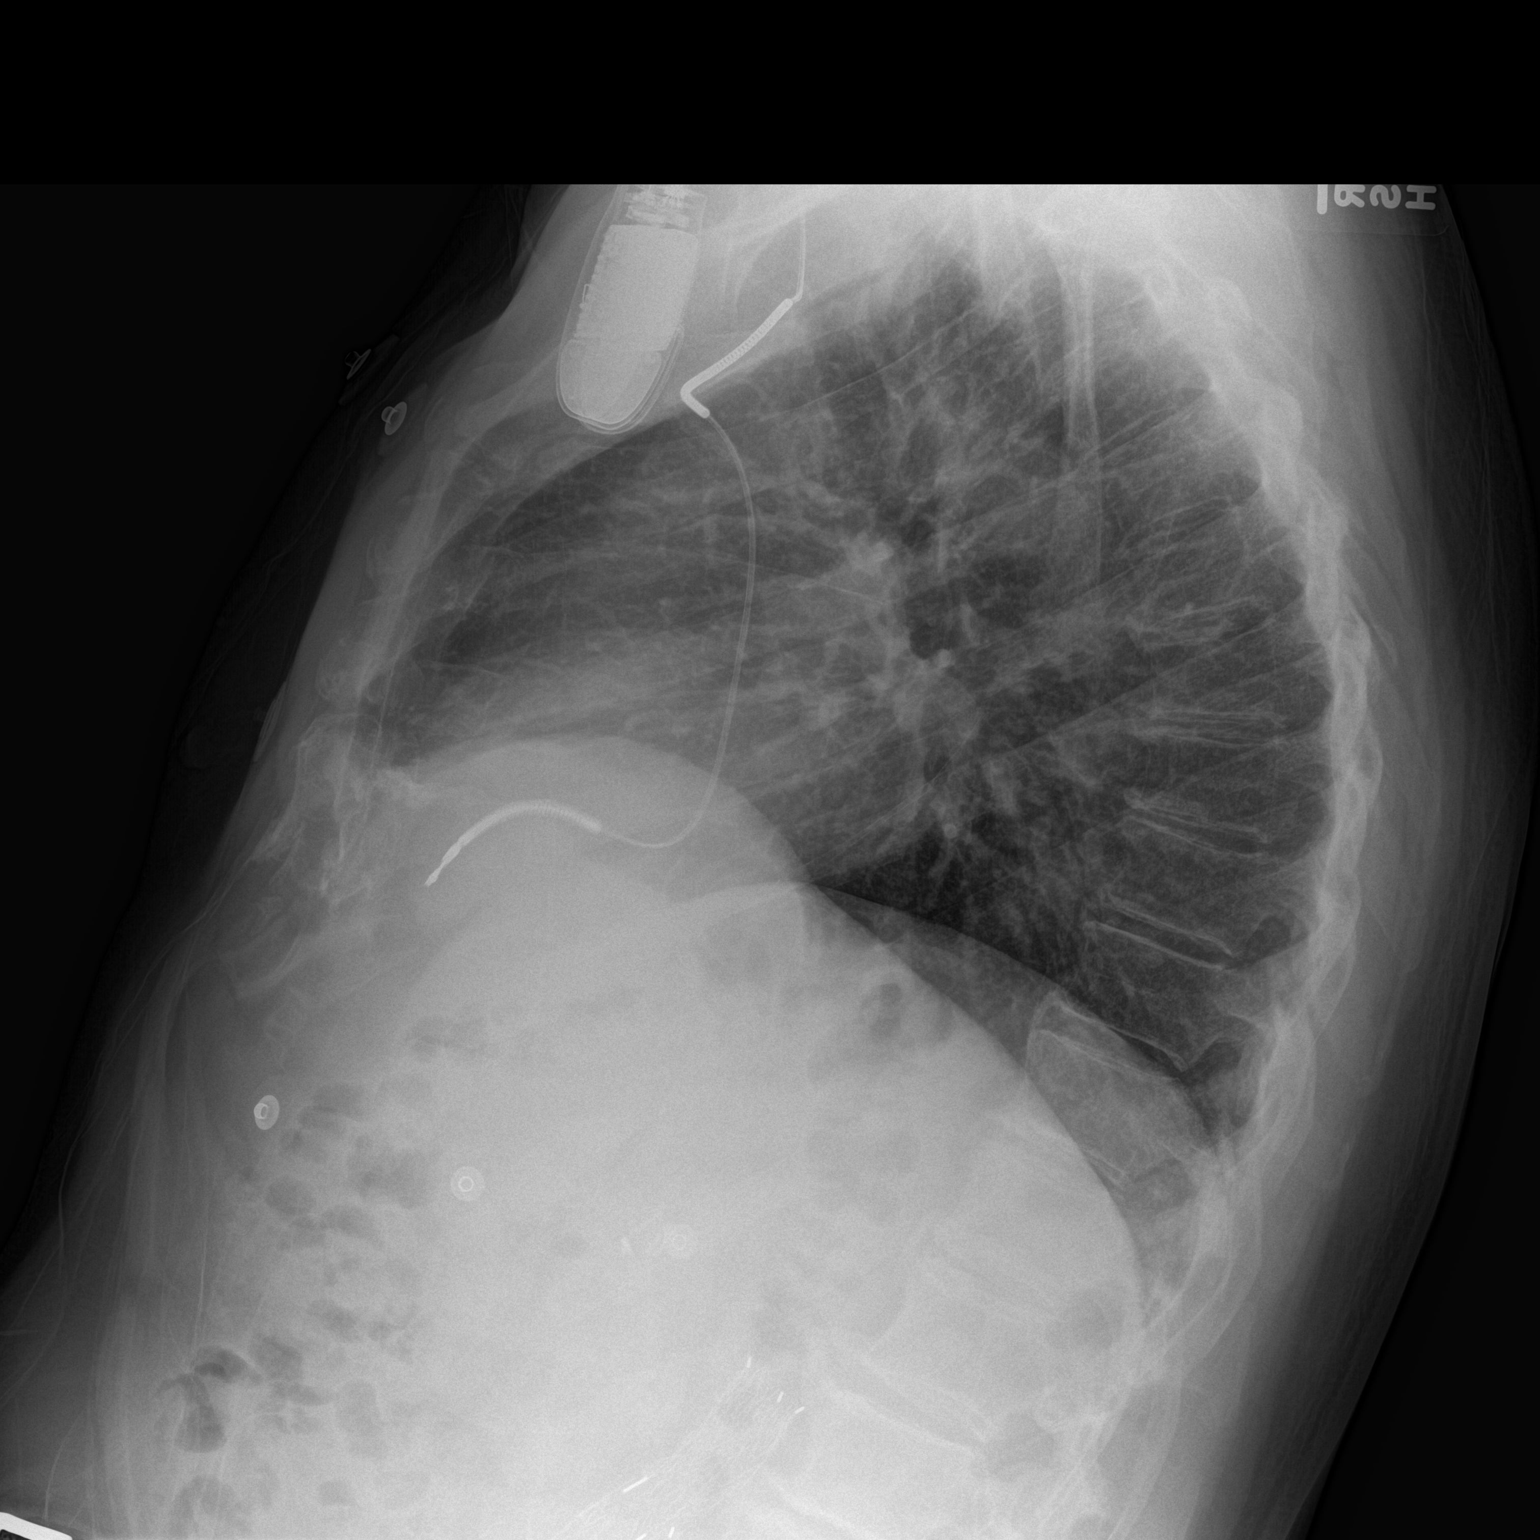

[2 of 2 positions shown; findings below may reference images not displayed]

FINDINGS: Again noted is a left single lead cardiac ICD. Heart size is normal.
Both lungs are clear without pneumothorax. No pulmonary edema. Old
right rib fractures. There is an abdominal aortic stent.
IMPRESSION: No acute chest abnormality.

## 2016-11-17 NOTE — Addendum Note (Signed)
Addended by: Lianne Cure A on: 11/17/2016 09:29 AM   Modules accepted: Orders

## 2016-11-21 LAB — CUP PACEART REMOTE DEVICE CHECK
Date Time Interrogation Session: 20171102023713
HighPow Impedance: 46 Ohm
Implantable Lead Implant Date: 20090427
Implantable Lead Location: 753860
Implantable Pulse Generator Implant Date: 20090427
Lead Channel Impedance Value: 280 Ohm
Lead Channel Pacing Threshold Amplitude: 1.25 V
Lead Channel Pacing Threshold Pulse Width: 0.6 ms
Lead Channel Sensing Intrinsic Amplitude: 6 mV
Lead Channel Setting Pacing Pulse Width: 0.6 ms
MDC IDC LEAD MODEL: 7121
MDC IDC MSMT BATTERY REMAINING LONGEVITY: 5 mo
MDC IDC MSMT BATTERY VOLTAGE: 2.5 V
MDC IDC SET LEADCHNL RV PACING AMPLITUDE: 2.5 V
MDC IDC SET LEADCHNL RV SENSING SENSITIVITY: 0.3 mV
MDC IDC STAT BRADY RV PERCENT PACED: 1 %
Pulse Gen Serial Number: 549899

## 2016-12-26 ENCOUNTER — Telehealth: Payer: Self-pay | Admitting: Cardiology

## 2016-12-26 NOTE — Telephone Encounter (Signed)
Pt called and stated that he felt a vibration from his device. Reviewed transmission. Informed pt that his device has reached ERI and that he will feel the vibration every 10 hours for 1-2 days and it will time itself out. Pt has appt w/ NP on 01-18-17 informed pt that procedure risk and benefits will be discussed at this appt. Informed pt that once his device reacher ERI that battery has about 3 months left on the battery life. Pt verbalized understanding.

## 2017-01-02 ENCOUNTER — Other Ambulatory Visit: Payer: Self-pay | Admitting: *Deleted

## 2017-01-02 DIAGNOSIS — N400 Enlarged prostate without lower urinary tract symptoms: Secondary | ICD-10-CM | POA: Diagnosis not present

## 2017-01-02 DIAGNOSIS — R972 Elevated prostate specific antigen [PSA]: Secondary | ICD-10-CM | POA: Diagnosis not present

## 2017-01-02 MED ORDER — COLESEVELAM HCL 625 MG PO TABS: ORAL_TABLET | ORAL | 2 refills | Status: DC

## 2017-01-02 MED ORDER — CARVEDILOL 3.125 MG PO TABS: 3.1250 mg | ORAL_TABLET | Freq: Two times a day (BID) | ORAL | 2 refills | Status: DC

## 2017-01-02 MED ORDER — SOTALOL HCL 80 MG PO TABS: 80.0000 mg | ORAL_TABLET | Freq: Two times a day (BID) | ORAL | 2 refills | Status: DC

## 2017-01-02 MED ORDER — LISINOPRIL 5 MG PO TABS: 5.0000 mg | ORAL_TABLET | Freq: Every day | ORAL | 2 refills | Status: DC

## 2017-01-06 ENCOUNTER — Telehealth: Payer: Self-pay | Admitting: Interventional Cardiology

## 2017-01-06 NOTE — Telephone Encounter (Signed)
°  New Prob  Has a question regarding possible therapeutic duplication for Carvedilol and Sotalol. Please call.

## 2017-01-06 NOTE — Telephone Encounter (Signed)
I have spoke with Lattie Haw at [facility] and made her aware of below. They will get these refills processed for the patient.

## 2017-01-06 NOTE — Telephone Encounter (Signed)
Will forward to Alvis Lemmings, Dr Olin Pia nurse, as Carvedilol was added at the pts last OV with Dr Caryl Comes.

## 2017-01-06 NOTE — Telephone Encounter (Signed)
Routing to [employer], LPN as this is Dr. Hassell Done patient.

## 2017-01-06 NOTE — Telephone Encounter (Signed)
Refill team- I gave you all a paper copy of this this morning- can you please call the # below and advise - OK per MD for the patient to be on sotalol and coreg- he is being followed.  Thanks!

## 2017-01-16 NOTE — Progress Notes (Signed)
Electrophysiology Office Note Date: 01/18/2017  ID:  Joel Lin, DOB 1941-05-02, MRN [id number]  PCP: Kandice Hams, MD Primary Cardiologist: [place] Electrophysiologist: Caryl Comes  CC: ICD at [facility] is a 76 y.o. male seen today for Dr Caryl Comes.  He was admitted in July of 2017 following appropriate therapy from ICD and was started on Sotalol 120mg  bid at that time.  He has since had trouble with chronotropic incompetence and exercise intolerance. His device was recently found to be at [facility] and he presents today to discuss generator change. He denies chest pain, palpitations, dyspnea, PND, orthopnea, nausea, vomiting, dizziness, syncope, edema, weight gain, or early satiety.  He has not had ICD shocks.   Device History: STJ single chamber ICD implanted 2009 for secondary prevention History of appropriate therapy: yes, and inappropriate therapy  History of AAD therapy: yes - sotalol   Past Medical History:  Diagnosis Date  . AAA (abdominal aortic aneurysm) ([place])   . Cardiac arrest ([place]) 03/29/2008   a. resuscitated OOH VF arrest  b. s/p STJ ICD  . Contrast media allergy   . Coronary artery disease    myoview 2011>>EF of 53% / large area of old inferolateral infarct but no reversible ischemia          . Hemorrhoid   . Hypercoagulable state ([place])    Questionable history of a borderline hypercoagulable state  . Hyperlipidemia   . Myocardial infarction 1981  . Peripheral vascular disease ([place])   . PVCs (premature ventricular contractions)    occasionally  . Ventricular tachycardia ([place])    a. appropriate ICD therpay b. on Sotalol   Past Surgical History:  Procedure Laterality Date  . ABDOMINAL AORTIC ENDOVASCULAR STENT GRAFT N/A 12/06/2013   Procedure: ABDOMINAL AORTIC ENDOVASCULAR STENT GRAFT;  Surgeon: Rosetta Posner, MD;  Location: [place];  Service: Vascular;  Laterality: N/A;  . CARDIAC CATHETERIZATION  04/04/2008    LAD stent patent, D1 OK,  OM1 90/80/80% (45mm vessel), RCA 30%; EF 35%.  Marland Kitchen CARDIAC DEFIBRILLATOR PLACEMENT  04/07/2008    STJ single chamber ICD implanted for secondary prevention  . CHOLECYSTECTOMY    . CHOLECYSTECTOMY, LAPAROSCOPIC  08/13/2009   Calculous cholecystitis  . CORONARY ANGIOPLASTY WITH STENT PLACEMENT  1999   PTCA and stenting of his left anterior descending artery  . LEFT HEART CATHETERIZATION WITH CORONARY ANGIOGRAM N/A 02/28/2013   Procedure: LEFT HEART CATHETERIZATION WITH CORONARY ANGIOGRAM;  Surgeon: Sherren Mocha, MD;  Location: [facility] CATH LAB;  Service: Cardiovascular;  Laterality: N/A;  . TONSILLECTOMY      Current Outpatient Prescriptions  Medication Sig Dispense Refill  . aspirin 81 MG tablet Take 81 mg by mouth daily.     . carvedilol (COREG) 3.125 MG tablet Take 1 tablet (3.125 mg total) by mouth 2 (two) times daily. 180 tablet 2  . Cholecalciferol (VITAMIN D-3 PO) Take 300 mg by mouth daily.     . colesevelam (WELCHOL) 625 MG tablet Take 1 tablet (625 mg  total) by mouth 2 (two)  times daily with a meal. 180 tablet 2  . Fexofenadine HCl (ALLEGRA PO) Take 1 tablet by mouth daily.     . fish oil-omega-3 fatty acids 1000 MG capsule Take 1 g by mouth daily.     . Glucosamine Sulfate-MSM (GLUCOSAMINE-MSM DS PO) Take 1 tablet by mouth daily.    Marland Kitchen ibuprofen (ADVIL,MOTRIN) 200 MG tablet Take 200 mg by mouth every 6 (six) hours as needed (pain).    Marland Kitchen  lisinopril (PRINIVIL,ZESTRIL) 5 MG tablet Take 1 tablet (5 mg total) by mouth daily. 90 tablet 2  . magnesium oxide (MAG-OX) 400 MG tablet Take 400 mg by mouth every other day.    . meloxicam (MOBIC) 15 MG tablet Take 1 tablet by mouth daily.    . Multiple Vitamin (MULTIVITAMIN WITH MINERALS) TABS Take 1 tablet by mouth daily.    . nitroGLYCERIN (NITROSTAT) 0.4 MG SL tablet Place 1 tablet (0.4 mg total) under the tongue every 5 (five) minutes as needed for chest pain ((MAX 3 DOSES)). 25 tablet 3  . rosuvastatin (CRESTOR) 5 MG tablet Take 1 tablet (5 mg  total) by mouth daily. 90 tablet 3  . sotalol (BETAPACE) 80 MG tablet Take 1 tablet (80 mg total) by mouth 2 (two) times daily. 180 tablet 2   No current facility-administered medications for this visit.     Allergies:   Epinephrine; Losartan; Metoprolol; Mydriacyl [tropicamide]; Phenylephrine hcl; Bisoprolol; Clopidogrel bisulfate; Iodine; and Statins   Social History: Social History   Social History  . Marital status: Married    Spouse name: N/A  . Number of children: N/A  . Years of education: N/A   Occupational History  . Retired Pharmacist, hospital    Social History Main Topics  . Smoking status: Former Smoker    Packs/day: 1.00    Years: 20.00    Types: Cigarettes    Quit date: 12/23/1979  . Smokeless tobacco: Never Used  . Alcohol use No  . Drug use: No  . Sexual activity: Yes   Other Topics Concern  . Not on file   Social History Narrative  . No narrative on file    Family History: Family History  Problem Relation Age of Onset  . Hyperlipidemia Mother   . Heart disease Mother   . Heart disease Father     before age 16  . Hyperlipidemia Father   . Mitral valve prolapse Sister   . Stomach cancer    . Heart attack Neg Hx   . Hypertension Neg Hx     Review of Systems: All other systems reviewed and are otherwise negative except as noted above.   Physical Exam: VS:  BP 120/70   Pulse (!) 54   Ht 5' 8.5" (1.74 m)   Wt 186 lb 12.8 oz (84.7 kg)   SpO2 97%   BMI 27.99 kg/m  , BMI Body mass index is 27.99 kg/m.  GEN- The patient is well appearing, alert and oriented x 3 today.   HEENT: normocephalic, atraumatic; sclera clear, conjunctiva pink; hearing intact; oropharynx clear; neck supple Lungs- Clear to ausculation bilaterally, normal work of breathing.  No wheezes, rales, rhonchi Heart- Bradycardic regular rate and rhythm, no murmurs, rubs or gallops GI- soft, non-tender, non-distended, bowel sounds present Extremities- no clubbing, cyanosis, or edema;  DP/PT/radial pulses 2+ bilaterally MS- no significant deformity or atrophy Skin- warm and dry, no rash or lesion; ICD pocket well healed Psych- euthymic mood, full affect Neuro- strength and sensation are intact  ICD interrogation- reviewed in detail today,  See PACEART report  EKG:  EKG is not ordered today.  Recent Labs: 05/27/2016: ALT 35; BUN 19; Creat 0.95; Potassium 4.6; Sodium 138 07/11/2016: Magnesium 2.2   Wt Readings from Last 3 Encounters:  01/18/17 186 lb 12.8 oz (84.7 kg)  10/25/16 183 lb 9.6 oz (83.3 kg)  08/30/16 185 lb 6.4 oz (84.1 kg)     Other studies Reviewed: Additional studies/ records that were reviewed today  include: Dr Caryl Comes and Dr Hassell Done office notes  Assessment and Plan:  1.  Chronic systolic dysfunction euvolemic today Stable on an appropriate medical regimen Normal ICD function See Pace Art report No changes today ICD at [facility].  Risks, benefits to generator change reviewed with patient today who wishes to proceed. With symptomatic bradycardia and need for AAD therapy, will plan upgrade to dual chamber ICD at time of gen change.  With narrow QRS, he does not meet criteria for CRT Update echo in anticipation of gen change   2.  VT No recent recurrence Continue Sotalol  BMET, Mg today  3.  ICM/CAD No recent ischemic symptoms Continue medical therapy  4.  Symptomatic bradycardia Plan upgrade to dual chamber device as above.   Current medicines are reviewed at length with the patient today.   The patient does not have concerns regarding his medicines.  The following changes were made today:  none  Labs/ tests ordered today include: BMET, Mg Orders Placed This Encounter  Procedures  . Basic metabolic panel  . Magnesium  . ECHOCARDIOGRAM COMPLETE     Disposition:   Follow up with Dr Caryl Comes after gen change    Signed, Chanetta Marshall, NP 01/18/2017 9:30 AM  [place]  [address] [phone] (office) [phone] (fax)

## 2017-01-18 ENCOUNTER — Encounter: Payer: Self-pay | Admitting: Nurse Practitioner

## 2017-01-18 ENCOUNTER — Ambulatory Visit (INDEPENDENT_AMBULATORY_CARE_PROVIDER_SITE_OTHER): Payer: Medicare HMO | Admitting: Nurse Practitioner

## 2017-01-18 VITALS — BP 120/70 | HR 54 | Ht 68.5 in | Wt 186.8 lb

## 2017-01-18 DIAGNOSIS — I472 Ventricular tachycardia, unspecified: Secondary | ICD-10-CM

## 2017-01-18 DIAGNOSIS — R001 Bradycardia, unspecified: Secondary | ICD-10-CM

## 2017-01-18 DIAGNOSIS — I5022 Chronic systolic (congestive) heart failure: Secondary | ICD-10-CM

## 2017-01-18 DIAGNOSIS — I255 Ischemic cardiomyopathy: Secondary | ICD-10-CM | POA: Diagnosis not present

## 2017-01-18 NOTE — Patient Instructions (Addendum)
Medication Instructions:  Your physician recommends that you continue on your current medications as directed. Please refer to the Current Medication list given to you today.   Labwork: Bmet and Mag today  Testing/Procedures: Your physician has requested that you have an echocardiogram. Echocardiography is a painless test that uses sound waves to create images of your heart. It provides your doctor with information about the size and shape of your heart and how well your heart's chambers and valves are working. This procedure takes approximately one hour. There are no restrictions for this procedure.    Follow-Up: Your physician has recommended that you have a generator change  This is done in the hospital and usually requires an overnight stay. Please see the instruction sheet given to you today for more information.    Any Other Special Instructions Will Be Listed Below (If Applicable).     If you need a refill on your cardiac medications before your next appointment, please call your pharmacy.

## 2017-01-19 ENCOUNTER — Telehealth: Payer: Self-pay | Admitting: *Deleted

## 2017-01-19 ENCOUNTER — Telehealth: Payer: Self-pay | Admitting: Nurse Practitioner

## 2017-01-19 LAB — BASIC METABOLIC PANEL
BUN / CREAT RATIO: 27 — AB (ref 10–24)
BUN: 27 mg/dL (ref 8–27)
CO2: 24 mmol/L (ref 18–29)
CREATININE: 1.01 mg/dL (ref 0.76–1.27)
Calcium: 9.3 mg/dL (ref 8.6–10.2)
Chloride: 101 mmol/L (ref 96–106)
GFR calc Af Amer: 84 mL/min/{1.73_m2} (ref >59)
GFR calc non Af Amer: 73 mL/min/{1.73_m2} (ref >59)
GLUCOSE: 87 mg/dL (ref 65–99)
Potassium: 4.4 mmol/L (ref 3.5–5.2)
Sodium: 140 mmol/L (ref 134–144)

## 2017-01-19 LAB — CUP PACEART INCLINIC DEVICE CHECK
Date Time Interrogation Session: 20180208081828
Implantable Lead Implant Date: 20090427
Implantable Lead Location: 753860
Implantable Lead Model: 7121
MDC IDC PG IMPLANT DT: 20090427
MDC IDC PG SERIAL: 549899

## 2017-01-19 LAB — MAGNESIUM: MAGNESIUM: 2.4 mg/dL — AB (ref 1.6–2.3)

## 2017-01-19 NOTE — Telephone Encounter (Signed)
LMOVM TO CALL BACK FOR RESULTS 

## 2017-01-19 NOTE — Telephone Encounter (Signed)
New Message  Pt daughter returning call to RN. Please call back to discuss

## 2017-01-19 NOTE — Telephone Encounter (Signed)
-----   Message from Patsey Berthold, NP sent at 01/19/2017  6:50 AM EST ----- Please notify patient of lab results.  Hold Magnesium supplement and will repeat Mg level at gen change. Thanks!

## 2017-01-20 ENCOUNTER — Telehealth: Payer: Self-pay | Admitting: *Deleted

## 2017-01-20 ENCOUNTER — Other Ambulatory Visit: Payer: Self-pay

## 2017-01-20 ENCOUNTER — Ambulatory Visit (HOSPITAL_COMMUNITY): Payer: Medicare HMO | Attending: Internal Medicine

## 2017-01-20 DIAGNOSIS — I071 Rheumatic tricuspid insufficiency: Secondary | ICD-10-CM | POA: Diagnosis not present

## 2017-01-20 DIAGNOSIS — I255 Ischemic cardiomyopathy: Secondary | ICD-10-CM | POA: Diagnosis not present

## 2017-01-20 DIAGNOSIS — I714 Abdominal aortic aneurysm, without rupture: Secondary | ICD-10-CM | POA: Insufficient documentation

## 2017-01-20 DIAGNOSIS — Z95810 Presence of automatic (implantable) cardiac defibrillator: Secondary | ICD-10-CM | POA: Insufficient documentation

## 2017-01-20 DIAGNOSIS — E785 Hyperlipidemia, unspecified: Secondary | ICD-10-CM | POA: Insufficient documentation

## 2017-01-20 DIAGNOSIS — I2510 Atherosclerotic heart disease of native coronary artery without angina pectoris: Secondary | ICD-10-CM | POA: Diagnosis not present

## 2017-01-20 DIAGNOSIS — I739 Peripheral vascular disease, unspecified: Secondary | ICD-10-CM | POA: Diagnosis not present

## 2017-01-20 DIAGNOSIS — I10 Essential (primary) hypertension: Secondary | ICD-10-CM | POA: Diagnosis not present

## 2017-01-20 DIAGNOSIS — Z87891 Personal history of nicotine dependence: Secondary | ICD-10-CM | POA: Diagnosis not present

## 2017-01-20 DIAGNOSIS — I252 Old myocardial infarction: Secondary | ICD-10-CM | POA: Diagnosis not present

## 2017-01-20 DIAGNOSIS — Z8249 Family history of ischemic heart disease and other diseases of the circulatory system: Secondary | ICD-10-CM | POA: Insufficient documentation

## 2017-01-20 NOTE — Telephone Encounter (Signed)
SPOKE TO PT ABOUT RESULTS 

## 2017-01-20 NOTE — Telephone Encounter (Signed)
-----   Message from Patsey Berthold, NP sent at 01/19/2017  6:50 AM EST ----- Please notify patient of lab results.  Hold Magnesium supplement and will repeat Mg level at gen change. Thanks!

## 2017-01-23 NOTE — Telephone Encounter (Signed)
Follow up       Talk to the nurse about "is there a presc for magnesium that is easy on the stomach"?  Also, pt want a copy of his 01-18-17 lab results.  Please call

## 2017-01-31 DIAGNOSIS — I10 Essential (primary) hypertension: Secondary | ICD-10-CM | POA: Diagnosis not present

## 2017-01-31 DIAGNOSIS — J309 Allergic rhinitis, unspecified: Secondary | ICD-10-CM | POA: Diagnosis not present

## 2017-01-31 DIAGNOSIS — I714 Abdominal aortic aneurysm, without rupture: Secondary | ICD-10-CM | POA: Diagnosis not present

## 2017-01-31 DIAGNOSIS — I2510 Atherosclerotic heart disease of native coronary artery without angina pectoris: Secondary | ICD-10-CM | POA: Diagnosis not present

## 2017-01-31 DIAGNOSIS — I255 Ischemic cardiomyopathy: Secondary | ICD-10-CM | POA: Diagnosis not present

## 2017-01-31 DIAGNOSIS — Z0000 Encounter for general adult medical examination without abnormal findings: Secondary | ICD-10-CM | POA: Diagnosis not present

## 2017-01-31 DIAGNOSIS — Z1389 Encounter for screening for other disorder: Secondary | ICD-10-CM | POA: Diagnosis not present

## 2017-01-31 DIAGNOSIS — E7800 Pure hypercholesterolemia, unspecified: Secondary | ICD-10-CM | POA: Diagnosis not present

## 2017-01-31 DIAGNOSIS — I472 Ventricular tachycardia: Secondary | ICD-10-CM | POA: Diagnosis not present

## 2017-02-02 DIAGNOSIS — L814 Other melanin hyperpigmentation: Secondary | ICD-10-CM | POA: Diagnosis not present

## 2017-02-02 DIAGNOSIS — D1800 Hemangioma unspecified site: Secondary | ICD-10-CM | POA: Diagnosis not present

## 2017-02-02 DIAGNOSIS — D225 Melanocytic nevi of trunk: Secondary | ICD-10-CM | POA: Diagnosis not present

## 2017-02-02 DIAGNOSIS — L821 Other seborrheic keratosis: Secondary | ICD-10-CM | POA: Diagnosis not present

## 2017-02-13 ENCOUNTER — Encounter (HOSPITAL_COMMUNITY)
Admission: RE | Disposition: A | Discharge status: Home / Self Care | Payer: Self-pay | Source: Ambulatory Visit | Attending: Cardiology

## 2017-02-13 ENCOUNTER — Encounter (HOSPITAL_COMMUNITY): Payer: Self-pay | Admitting: *Deleted

## 2017-02-13 ENCOUNTER — Ambulatory Visit (HOSPITAL_COMMUNITY)
Admission: RE | Admit: 2017-02-13 | Discharge: 2017-02-14 | Disposition: A | Discharge status: Home / Self Care | Payer: Medicare HMO | Source: Ambulatory Visit | Attending: Cardiology | Admitting: Cardiology

## 2017-02-13 DIAGNOSIS — Z95818 Presence of other cardiac implants and grafts: Secondary | ICD-10-CM

## 2017-02-13 DIAGNOSIS — I2510 Atherosclerotic heart disease of native coronary artery without angina pectoris: Secondary | ICD-10-CM | POA: Insufficient documentation

## 2017-02-13 DIAGNOSIS — I255 Ischemic cardiomyopathy: Secondary | ICD-10-CM | POA: Diagnosis not present

## 2017-02-13 DIAGNOSIS — Z888 Allergy status to other drugs, medicaments and biological substances status: Secondary | ICD-10-CM | POA: Insufficient documentation

## 2017-02-13 DIAGNOSIS — I252 Old myocardial infarction: Secondary | ICD-10-CM | POA: Diagnosis not present

## 2017-02-13 DIAGNOSIS — Z4502 Encounter for adjustment and management of automatic implantable cardiac defibrillator: Secondary | ICD-10-CM | POA: Insufficient documentation

## 2017-02-13 DIAGNOSIS — I495 Sick sinus syndrome: Secondary | ICD-10-CM | POA: Diagnosis not present

## 2017-02-13 DIAGNOSIS — I509 Heart failure, unspecified: Secondary | ICD-10-CM | POA: Diagnosis not present

## 2017-02-13 HISTORY — PX: ICD REVISION: EP1209

## 2017-02-13 LAB — CBC
HEMATOCRIT: 43.9 % (ref 39.0–52.0)
HEMOGLOBIN: 14.5 g/dL (ref 13.0–17.0)
MCH: 28.4 pg (ref 26.0–34.0)
MCHC: 33 g/dL (ref 30.0–36.0)
MCV: 85.9 fL (ref 78.0–100.0)
Platelets: 131 10*3/uL — ABNORMAL LOW (ref 150–400)
RBC: 5.11 MIL/uL (ref 4.22–5.81)
RDW: 13.5 % (ref 11.5–15.5)
WBC: 4.4 10*3/uL (ref 4.0–10.5)

## 2017-02-13 LAB — SURGICAL PCR SCREEN
MRSA, PCR: NEGATIVE
Staphylococcus aureus: POSITIVE — AB

## 2017-02-13 SURGERY — ICD REVISION

## 2017-02-13 MED ORDER — ACETAMINOPHEN 325 MG PO TABS: 325.0000 mg | ORAL_TABLET | ORAL | Status: DC | PRN

## 2017-02-13 MED ORDER — GLUCOSAMINE SULFATE-MSM 500-500 MG PO TABS: ORAL_TABLET | Freq: Every day | ORAL | Status: DC

## 2017-02-13 MED ORDER — CHLORHEXIDINE GLUCONATE 4 % EX LIQD: 60.0000 mL | Freq: Once | CUTANEOUS | Status: DC

## 2017-02-13 MED ORDER — FAMOTIDINE IN NACL 20-0.9 MG/50ML-% IV SOLN
INTRAVENOUS | Status: AC
  Filled 2017-02-13: qty 50

## 2017-02-13 MED ORDER — METHYLPREDNISOLONE SODIUM SUCC 125 MG IJ SOLR
INTRAMUSCULAR | Status: AC
  Filled 2017-02-13: qty 2

## 2017-02-13 MED ORDER — ADULT MULTIVITAMIN W/MINERALS CH
1.0000 | ORAL_TABLET | Freq: Every day | ORAL | Status: DC
  Administered 2017-02-14: 1 via ORAL
  Filled 2017-02-13: qty 1

## 2017-02-13 MED ORDER — NITROGLYCERIN 0.2 MG/HR TD PT24
0.2000 mg | MEDICATED_PATCH | Freq: Every day | TRANSDERMAL | Status: DC
  Administered 2017-02-14: 0.2 mg via TRANSDERMAL
  Filled 2017-02-13: qty 1

## 2017-02-13 MED ORDER — MIDAZOLAM HCL 5 MG/5ML IJ SOLN
INTRAMUSCULAR | Status: AC
  Filled 2017-02-13: qty 5

## 2017-02-13 MED ORDER — HEPARIN (PORCINE) IN NACL 2-0.9 UNIT/ML-% IJ SOLN
INTRAMUSCULAR | Status: AC
  Filled 2017-02-13: qty 500

## 2017-02-13 MED ORDER — SODIUM CHLORIDE 0.9 % IR SOLN
80.0000 mg | Status: AC
  Administered 2017-02-13: 80 mg

## 2017-02-13 MED ORDER — MUPIROCIN 2 % EX OINT
1.0000 | TOPICAL_OINTMENT | Freq: Once | CUTANEOUS | Status: AC
Start: 2017-02-13 — End: 2017-02-13
  Administered 2017-02-13: 1 via TOPICAL

## 2017-02-13 MED ORDER — CEFAZOLIN IN D5W 1 GM/50ML IV SOLN
1.0000 g | Freq: Four times a day (QID) | INTRAVENOUS | Status: AC
  Administered 2017-02-13 – 2017-02-14 (×3): 1 g via INTRAVENOUS
  Filled 2017-02-13 (×3): qty 50

## 2017-02-13 MED ORDER — CARVEDILOL 3.125 MG PO TABS
3.1250 mg | ORAL_TABLET | Freq: Two times a day (BID) | ORAL | Status: DC
  Administered 2017-02-13 – 2017-02-14 (×2): 3.125 mg via ORAL
  Filled 2017-02-13 (×2): qty 1

## 2017-02-13 MED ORDER — NITROGLYCERIN 0.4 MG SL SUBL: 0.4000 mg | SUBLINGUAL_TABLET | SUBLINGUAL | Status: DC | PRN

## 2017-02-13 MED ORDER — IOPAMIDOL (ISOVUE-370) INJECTION 76%
INTRAVENOUS | Status: DC | PRN
  Administered 2017-02-13: 15 mL via INTRAVENOUS

## 2017-02-13 MED ORDER — OMEGA-3 FATTY ACIDS 1000 MG PO CAPS: 1.0000 g | ORAL_CAPSULE | Freq: Every day | ORAL | Status: DC

## 2017-02-13 MED ORDER — LISINOPRIL 5 MG PO TABS
5.0000 mg | ORAL_TABLET | Freq: Every day | ORAL | Status: DC
  Administered 2017-02-13: 5 mg via ORAL
  Filled 2017-02-13 (×2): qty 1

## 2017-02-13 MED ORDER — VITAMIN D 1000 UNITS PO TABS
1000.0000 [IU] | ORAL_TABLET | Freq: Every day | ORAL | Status: DC
  Administered 2017-02-13 – 2017-02-14 (×2): 1000 [IU] via ORAL
  Filled 2017-02-13 (×2): qty 1

## 2017-02-13 MED ORDER — FAMOTIDINE IN NACL 20-0.9 MG/50ML-% IV SOLN
20.0000 mg | INTRAVENOUS | Status: AC
  Administered 2017-02-13: 20 mg via INTRAVENOUS

## 2017-02-13 MED ORDER — MELATONIN 3 MG PO TABS
3.0000 mg | ORAL_TABLET | Freq: Every evening | ORAL | Status: DC | PRN
  Administered 2017-02-13: 3 mg via ORAL
  Filled 2017-02-13 (×2): qty 1

## 2017-02-13 MED ORDER — LIDOCAINE HCL (PF) 1 % IJ SOLN
INTRAMUSCULAR | Status: AC
  Filled 2017-02-13: qty 30

## 2017-02-13 MED ORDER — FLUTICASONE PROPIONATE 50 MCG/ACT NA SUSP
1.0000 | Freq: Every day | NASAL | Status: DC | PRN
  Filled 2017-02-13: qty 16

## 2017-02-13 MED ORDER — ASPIRIN EC 81 MG PO TBEC
81.0000 mg | DELAYED_RELEASE_TABLET | Freq: Every evening | ORAL | Status: DC
  Administered 2017-02-13: 81 mg via ORAL
  Filled 2017-02-13: qty 1

## 2017-02-13 MED ORDER — ONDANSETRON HCL 4 MG/2ML IJ SOLN: 4.0000 mg | Freq: Four times a day (QID) | INTRAMUSCULAR | Status: DC | PRN

## 2017-02-13 MED ORDER — CEFAZOLIN SODIUM-DEXTROSE 2-4 GM/100ML-% IV SOLN
2.0000 g | INTRAVENOUS | Status: AC
  Administered 2017-02-13: 2 g via INTRAVENOUS

## 2017-02-13 MED ORDER — OMEGA-3-ACID ETHYL ESTERS 1 G PO CAPS
1.0000 g | ORAL_CAPSULE | Freq: Every day | ORAL | Status: DC
  Administered 2017-02-14: 1 g via ORAL
  Filled 2017-02-13: qty 1

## 2017-02-13 MED ORDER — MIDAZOLAM HCL 5 MG/5ML IJ SOLN
INTRAMUSCULAR | Status: DC | PRN
  Administered 2017-02-13 (×3): 1 mg via INTRAVENOUS

## 2017-02-13 MED ORDER — CEFAZOLIN SODIUM-DEXTROSE 2-4 GM/100ML-% IV SOLN
INTRAVENOUS | Status: AC
  Filled 2017-02-13: qty 100

## 2017-02-13 MED ORDER — DIPHENHYDRAMINE HCL 50 MG/ML IJ SOLN
25.0000 mg | INTRAMUSCULAR | Status: AC
  Administered 2017-02-13: 25 mg via INTRAVENOUS

## 2017-02-13 MED ORDER — COLESEVELAM HCL 625 MG PO TABS
625.0000 mg | ORAL_TABLET | Freq: Two times a day (BID) | ORAL | Status: DC
  Administered 2017-02-13 – 2017-02-14 (×2): 625 mg via ORAL
  Filled 2017-02-13 (×3): qty 1

## 2017-02-13 MED ORDER — FENTANYL CITRATE (PF) 100 MCG/2ML IJ SOLN
INTRAMUSCULAR | Status: AC
  Filled 2017-02-13: qty 2

## 2017-02-13 MED ORDER — ROSUVASTATIN CALCIUM 10 MG PO TABS
5.0000 mg | ORAL_TABLET | ORAL | Status: DC
  Administered 2017-02-13: 5 mg via ORAL
  Filled 2017-02-13 (×2): qty 1

## 2017-02-13 MED ORDER — IOPAMIDOL (ISOVUE-370) INJECTION 76%
INTRAVENOUS | Status: AC
  Filled 2017-02-13: qty 50

## 2017-02-13 MED ORDER — LORATADINE 10 MG PO TABS
10.0000 mg | ORAL_TABLET | Freq: Every day | ORAL | Status: DC
  Administered 2017-02-14: 10 mg via ORAL
  Filled 2017-02-13: qty 1

## 2017-02-13 MED ORDER — SODIUM CHLORIDE 0.9 % IV SOLN
INTRAVENOUS | Status: DC
  Administered 2017-02-13: 08:00:00 via INTRAVENOUS

## 2017-02-13 MED ORDER — HEPARIN (PORCINE) IN NACL 2-0.9 UNIT/ML-% IJ SOLN
INTRAMUSCULAR | Status: DC | PRN
  Administered 2017-02-13: 1000 mL

## 2017-02-13 MED ORDER — FENTANYL CITRATE (PF) 100 MCG/2ML IJ SOLN
INTRAMUSCULAR | Status: DC | PRN
  Administered 2017-02-13: 25 ug via INTRAVENOUS

## 2017-02-13 MED ORDER — SOTALOL HCL 80 MG PO TABS
80.0000 mg | ORAL_TABLET | Freq: Two times a day (BID) | ORAL | Status: DC
  Administered 2017-02-13 – 2017-02-14 (×2): 80 mg via ORAL
  Filled 2017-02-13 (×2): qty 1

## 2017-02-13 MED ORDER — DIPHENHYDRAMINE HCL 50 MG/ML IJ SOLN
INTRAMUSCULAR | Status: AC
  Filled 2017-02-13: qty 1

## 2017-02-13 MED ORDER — SODIUM CHLORIDE 0.9 % IR SOLN
Status: AC
  Filled 2017-02-13: qty 2

## 2017-02-13 MED ORDER — LIDOCAINE HCL (PF) 1 % IJ SOLN
INTRAMUSCULAR | Status: DC | PRN
  Administered 2017-02-13: 43 mL

## 2017-02-13 MED ORDER — IBUPROFEN 200 MG PO TABS: 400.0000 mg | ORAL_TABLET | Freq: Every day | ORAL | Status: DC | PRN

## 2017-02-13 MED ORDER — METHYLPREDNISOLONE SODIUM SUCC 125 MG IJ SOLR
125.0000 mg | INTRAMUSCULAR | Status: AC
  Administered 2017-02-13: 125 mg via INTRAVENOUS

## 2017-02-13 MED ORDER — MUPIROCIN 2 % EX OINT
TOPICAL_OINTMENT | CUTANEOUS | Status: AC
  Administered 2017-02-13: 1 via TOPICAL
  Filled 2017-02-13: qty 22

## 2017-02-13 SURGICAL SUPPLY — 9 items
CABLE SURGICAL S-101-97-12 (CABLE) ×2 IMPLANT
DEVICE DISSECT PLASMABLAD 3.0S (MISCELLANEOUS) ×1 IMPLANT
ELECT DEFIB PAD ADLT CADENCE (PAD) IMPLANT
ICD ELLIPSE DR CD2411-36C (ICD Generator) ×2 IMPLANT
LEAD TENDRIL MRI 52CM LPA1200M (Lead) ×2 IMPLANT
PAD DEFIB LIFELINK (PAD) ×2 IMPLANT
PLASMABLADE 3.0S (MISCELLANEOUS) ×2
SHEATH CLASSIC 8F (SHEATH) ×2 IMPLANT
TRAY PACEMAKER INSERTION (PACKS) ×2 IMPLANT

## 2017-02-13 NOTE — Discharge Summary (Signed)
ELECTROPHYSIOLOGY PROCEDURE DISCHARGE SUMMARY    Patient ID: Joel Lin,  MRN: [id number], DOB/AGE: 1941/01/12 76 y.o.  Admit date: 02/13/2017 Discharge date: 02/14/2017  Primary Care Physician: Kandice Hams, MD  Primary Cardiologist: Dr. Irish Lack Electrophysiologist: Dr. Caryl Comes  Primary Discharge Diagnosis:  1. ICD at [facility] 2. Chronotropic incompetence     Symptomatic bradycardia   Secondary Discharge Diagnosis:  1. VT 2. CAD  Allergies  Allergen Reactions  . Epinephrine Other (See Comments)    Elevated heartbeat  . Losartan Palpitations and Other (See Comments)    Irregular heartbeat  . Metoprolol Other (See Comments)    Irregular heartbeat  . Mydriacyl [Tropicamide] Other (See Comments)    Elevated heartbeat  . Phenylephrine Hcl Other (See Comments)    Increased heartrate  . Bisoprolol Other (See Comments)    Feels it does not agree with him  . Clopidogrel Bisulfate Hives       . Iodine Hives    "Renografin"  . Statins Other (See Comments)    Muscle cramps (crestor low dose is ok)     Procedures This Admission:  1.  Implantation/upgrade to a SJM dual chamber ICD on 02/13/17 by Dr Curt Bears.  The patient received a [place] [id number] (serial  Number [id number]) ICD, [place] [id number]  (serial # [id number]  ) right atrial lead .  DFT's were deferred at time of implant. There were no immediate post procedure complications. 2.  CXR on 02/14/17 demonstrated no pneumothorax status post device implantation.   Brief HPI: Joel Lin is a 76 y.o. male was noted out patient that his ICD had reached ERI, he was fairly recently placed on SOtalol for VT with subsequent chronotropic incompetence, symptomatic bradycardia and planned for upgrade to a dual chamber device wt the time of generator change  Past medical history includes ICM, VT, CAD, HLD.  Risks, benefits, and alternatives to the procedure were reviewed  with the patient who wished to proceed.   Hospital Course:  The patient was admitted and underwent implantation/upgrade to a dual chamber ICD with details as outlined above. He was monitored on telemetry overnight which demonstrated A paced rhythm, occ AV pacing.  Left chest was without hematoma or ecchymosis.  The device was interrogated and found to be functioning normally.  CXR was obtained and demonstrated no pneumothorax status post device implantation.  Wound care, arm mobility, and restrictions were reviewed with the patient.  The patient was examined by Dr. Curt Bears and considered stable for discharge to home.   The patient's discharge medications include an ACE-I (lisinopril) and beta blocker (carvedilol).   Physical Exam: Vitals:   02/13/17 1322 02/13/17 1555 02/13/17 2117 02/14/17 0400  BP: 129/75 114/68 (!) 144/87 118/81  Pulse: 60 67 92 63  Resp:    16  Temp:  97.8 F (36.6 C) 97.4 F (36.3 C) 98 F (36.7 C)  TempSrc:  Oral Oral Oral  SpO2: 98% 96% 96% 96%  Weight:    173 lb 11.2 oz (78.8 kg)  Height:        GEN- The patient is well appearing, alert and oriented x 3 today.   HEENT: normocephalic, atraumatic; sclera clear, conjunctiva pink; hearing intact; oropharynx clear Lungs- CTA b/l  normal work of breathing.  No wheezes, rales, rhonchi Heart- RRR, no murmurs, rubs or gallops, PMI not laterally displaced GI- soft, non-tender, non-distended Extremities- no clubbing, cyanosis, or edema MS- no significant deformity or  atrophy Skin- warm and dry, no rash or lesion, left chest without hematoma/ecchymosis Psych- euthymic mood, full affect Neuro- no gross defecits  Labs:   Lab Results  Component Value Date   WBC 4.4 02/13/2017   HGB 14.5 02/13/2017   HCT 43.9 02/13/2017   MCV 85.9 02/13/2017   PLT 131 (L) 02/13/2017   No results for input(s): NA, K, CL, CO2, BUN, CREATININE, CALCIUM, PROT, BILITOT, ALKPHOS, ALT, AST, GLUCOSE in the last 168 hours.  Invalid  input(s): LABALBU  Discharge Medications:  Allergies as of 02/14/2017      Reactions   Epinephrine Other (See Comments)   Elevated heartbeat   Losartan Palpitations, Other (See Comments)   Irregular heartbeat   Metoprolol Other (See Comments)   Irregular heartbeat   Mydriacyl [tropicamide] Other (See Comments)   Elevated heartbeat   Phenylephrine Hcl Other (See Comments)   Increased heartrate   Bisoprolol Other (See Comments)   Feels it does not agree with him   Clopidogrel Bisulfate Hives      Iodine Hives   "Renografin"   Statins Other (See Comments)   Muscle cramps (crestor low dose is ok)      Medication List    TAKE these medications   aspirin 81 MG tablet Take 81 mg by mouth every evening.   carvedilol 3.125 MG tablet Commonly known as:  COREG Take 1 tablet (3.125 mg total) by mouth 2 (two) times daily.   cholecalciferol 1000 units tablet Commonly known as:  VITAMIN D Take 1,000 Units by mouth daily.   colesevelam 625 MG tablet Commonly known as:  WELCHOL Take 1 tablet (625 mg  total) by mouth 2 (two)  times daily with a meal.   fexofenadine 180 MG tablet Commonly known as:  ALLEGRA Take 180 mg by mouth daily.   fish oil-omega-3 fatty acids 1000 MG capsule Take 1 g by mouth daily.   fluticasone 50 MCG/ACT nasal spray Commonly known as:  FLONASE Place 1 spray into both nostrils daily as needed for allergies or rhinitis.   GLUCOSAMINE-MSM DS PO Take 1 tablet by mouth daily.   ibuprofen 200 MG tablet Commonly known as:  ADVIL,MOTRIN Take 400 mg by mouth daily as needed for moderate pain.   lisinopril 5 MG tablet Commonly known as:  PRINIVIL,ZESTRIL Take 1 tablet (5 mg total) by mouth daily.   Melatonin 5 MG Caps Take 5 mg by mouth at bedtime as needed (sleep).   multivitamin with minerals Tabs tablet Take 1 tablet by mouth daily.   nitroGLYCERIN 0.2 mg/hr patch Commonly known as:  NITRODUR - Dosed in mg/24 hr Place 0.2 mg onto the skin  daily.   nitroGLYCERIN 0.4 MG SL tablet Commonly known as:  NITROSTAT Place 1 tablet (0.4 mg total) under the tongue every 5 (five) minutes as needed for chest pain ((MAX 3 DOSES)).   rosuvastatin 5 MG tablet Commonly known as:  CRESTOR Take 1 tablet (5 mg total) by mouth daily. What changed:  when to take this Notes to patient:  Continue your current home regime, no changes are being made   sotalol 80 MG tablet Commonly known as:  BETAPACE Take 1 tablet (80 mg total) by mouth 2 (two) times daily.       Disposition:  Home Discharge Instructions    Diet - low sodium heart healthy    Complete by:  As directed    Increase activity slowly    Complete by:  As directed  Follow-up Information    [facility] [place] [facility] Follow up on 02/27/2017.   Specialty:  Cardiology Why:  12:00PM (noon), wound check Contact information: [address] [place]       Virl Axe, MD Follow up on 05/16/2017.   Specialty:  Cardiology Why:  1:45PM Contact information: [address] [place] [address] [phone]           Duration of Discharge Encounter: Greater than 30 minutes including physician time.  Signed, Tommye Standard, PA-C 02/14/2017 8:30 AM  I have seen and examined this patient with Tommye Standard.  Agree with above, note added to reflect my findings.  On exam, regular rhythm, no murmurs, lungs clear. His added yesterday for upgrade of his single-chamber ICD to a dual-chamber ICD. Atrial lead added and stable today. Chest x-ray and interrogation without major abnormalities. Plan for discharge today with follow up in device clinic in 10 days.    Rigdon Macomber M. Dakia Schifano MD 02/14/2017 9:32 AM

## 2017-02-13 NOTE — H&P (Signed)
Joel Lin is a 76 y.o. male with an ischemic cardiomyopathy who presents for generator change of his ICD. He also has chronotropic incompitence and thus [name] be adding a RA lead. On exam, regular rhythm, no murmurs, lungs clear. Risks and benefits of the procedure were discussed. Risks include but not limied to bleeding,  Infection, tamponade, pneumothorax. He understands these risks and has agreed to the procedure.  ICD Criteria  Current LVEF:40-45%. Within 12 months prior to implant: Yes   Heart failure history: Yes, Class II  Cardiomyopathy history: Yes, Ischemic Cardiomyopathy - Prior MI.  Atrial Fibrillation/Atrial Flutter: No.  Ventricular tachycardia history: Yes, Hemodynamic instability present. VT Type is unknown.  Cardiac arrest history: Yes, Ventricular Tachycardia.  History of syndromes with risk of sudden death: No.  Previous ICD: Yes, Reason for ICD:  Secondary prevention.  Current ICD indication: Secondary  PPM indication: Yes. Pacing type: Atrial. Greater than 40% RV pacing requirement anticipated. Indication: Sick Sinus Syndrome   Class I or II Bradycardia indication present: Yes  Beta Blocker therapy for 3 or more months: Yes, prescribed.   Ace Inhibitor/ARB therapy for 3 or more months: Yes, prescribed.

## 2017-02-13 NOTE — Progress Notes (Signed)
PHARMACIST - PHYSICIAN ORDER COMMUNICATION  CONCERNING: P&T Medication Policy on Herbal Medications  DESCRIPTION:  This patient's order for: Glucosamine has been noted.  This product(s) is classified as an "herbal" or natural product. Due to a lack of definitive safety studies or FDA approval, nonstandard manufacturing practices, plus the potential risk of unknown drug-drug interactions while on inpatient medications, the Pharmacy and Therapeutics Committee does not permit the use of "herbal" or natural products of this type within [facility].   ACTION TAKEN: The pharmacy department is unable to verify this order at this time and your patient has been informed of this safety policy. Please reevaluate patient's clinical condition at discharge and address if the herbal or natural product(s) should be resumed at that time.  Vincenza Hews, PharmD, BCPS 02/13/2017, 1:14 PM

## 2017-02-14 ENCOUNTER — Encounter (HOSPITAL_COMMUNITY): Payer: Self-pay | Admitting: Cardiology

## 2017-02-14 ENCOUNTER — Ambulatory Visit (HOSPITAL_COMMUNITY): Payer: Medicare HMO

## 2017-02-14 DIAGNOSIS — I2510 Atherosclerotic heart disease of native coronary artery without angina pectoris: Secondary | ICD-10-CM | POA: Diagnosis not present

## 2017-02-14 DIAGNOSIS — I252 Old myocardial infarction: Secondary | ICD-10-CM | POA: Diagnosis not present

## 2017-02-14 DIAGNOSIS — Z452 Encounter for adjustment and management of vascular access device: Secondary | ICD-10-CM | POA: Diagnosis not present

## 2017-02-14 DIAGNOSIS — I255 Ischemic cardiomyopathy: Secondary | ICD-10-CM | POA: Diagnosis not present

## 2017-02-14 DIAGNOSIS — I509 Heart failure, unspecified: Secondary | ICD-10-CM | POA: Diagnosis not present

## 2017-02-14 DIAGNOSIS — Z4502 Encounter for adjustment and management of automatic implantable cardiac defibrillator: Secondary | ICD-10-CM | POA: Diagnosis not present

## 2017-02-14 DIAGNOSIS — Z888 Allergy status to other drugs, medicaments and biological substances status: Secondary | ICD-10-CM | POA: Diagnosis not present

## 2017-02-14 DIAGNOSIS — I495 Sick sinus syndrome: Secondary | ICD-10-CM | POA: Diagnosis not present

## 2017-02-14 MED FILL — Sodium Chloride Irrigation Soln 0.9%: Qty: 500 | Status: AC

## 2017-02-14 MED FILL — Cefazolin Sodium-Dextrose IV Solution 2 GM/100ML-4%: INTRAVENOUS | Qty: 100 | Status: AC

## 2017-02-14 MED FILL — Gentamicin Sulfate Inj 40 MG/ML: INTRAMUSCULAR | Qty: 2 | Status: AC

## 2017-02-14 NOTE — Progress Notes (Signed)
Pt left the floor for xray.

## 2017-02-14 NOTE — Discharge Instructions (Signed)
° ° °  Supplemental Discharge Instructions for  Pacemaker/Defibrillator Patients  Activity No heavy lifting or vigorous activity with your left/right arm for 6 to 8 weeks.  Do not raise your left/right arm above your head for one week.  Gradually raise your affected arm as drawn below.            02/17/17                        02/18/17                    02/19/17                   02/20/17 __  NO DRIVING for  1 week   ; you may begin driving on  [id number]   .  WOUND CARE - Keep the wound area clean and dry.  Do not get this area wet, no showers until cleared to at your wound check visit - The tape/steri-strips on your wound will fall off; do not pull them off.  No bandage is needed on the site.  DO  NOT apply any creams, oils, or ointments to the wound area. - If you notice any drainage or discharge from the wound, any swelling or bruising at the site, or you develop a fever > 101? F after you are discharged home, call the office at once.  Special Instructions - You are still able to use cellular telephones; use the ear opposite the side where you have your pacemaker/defibrillator.  Avoid carrying your cellular phone near your device. - When traveling through airports, show security personnel your identification card to avoid being screened in the metal detectors.  Ask the security personnel to use the hand wand. - Avoid arc welding equipment, MRI testing (magnetic resonance imaging), TENS units (transcutaneous nerve stimulators).  Call the office for questions about other devices. - Avoid electrical appliances that are in poor condition or are not properly grounded. - Microwave ovens are safe to be near or to operate.  Additional information for defibrillator patients should your device go off: - If your device goes off ONCE and you feel fine afterward, notify the device clinic nurses. - If your device goes off ONCE and you do not feel well afterward, call 911. - If your device goes off TWICE,  call 911. - If your device goes off THREE times in one day, call 911.  DO NOT DRIVE YOURSELF OR A FAMILY MEMBER WITH A DEFIBRILLATOR TO THE HOSPITAL--CALL 911.

## 2017-02-20 ENCOUNTER — Other Ambulatory Visit: Payer: Self-pay | Admitting: *Deleted

## 2017-02-20 ENCOUNTER — Telehealth: Payer: Self-pay | Admitting: *Deleted

## 2017-02-20 NOTE — Telephone Encounter (Signed)
Patient called in c/o throat soreness since his gen change. Patient states that the soreness first started on Saturday, and it only happens when he lies flat. Patient denies any pain upon inhalation. I asked patient if he had any problems with indigestion. Patient states that he hasn't had indigestion in a while. I told patient that he could send in a remote transmission to make sure that his device function is stable. Verbal instructions provided. Patient voiced understanding.  Will call patient back once the transmission is received.

## 2017-02-20 NOTE — Telephone Encounter (Signed)
Remote transmission received. Presenting rhythm: ApVs, AsVs. No episodes recorded. Stable lead measurements. I encouraged patient to call his PCP about his throat soreness. I told him to call us back if he develops worsening sx's r/t his device. Patient voiced understanding.  Patient to f/u on 3/19 as scheduled.

## 2017-02-20 NOTE — Telephone Encounter (Signed)
OK to refill NTG patches

## 2017-02-20 NOTE — Telephone Encounter (Signed)
Patient requested a refill on nitro patches. Dr Irish Lack has never refilled these for the patient. Okay to refill? Please advise. Thanks, MI

## 2017-02-21 ENCOUNTER — Other Ambulatory Visit: Payer: Self-pay | Admitting: *Deleted

## 2017-02-21 MED ORDER — NITROGLYCERIN 0.2 MG/HR TD PT24: 0.2000 mg | MEDICATED_PATCH | Freq: Every day | TRANSDERMAL | 2 refills | Status: DC

## 2017-02-24 ENCOUNTER — Ambulatory Visit
Admission: RE | Admit: 2017-02-24 | Discharge: 2017-02-24 | Disposition: A | Discharge status: Home / Self Care | Payer: Medicare HMO | Source: Ambulatory Visit | Attending: Internal Medicine | Admitting: Internal Medicine

## 2017-02-24 ENCOUNTER — Other Ambulatory Visit: Payer: Self-pay | Admitting: Internal Medicine

## 2017-02-24 DIAGNOSIS — M898X1 Other specified disorders of bone, shoulder: Secondary | ICD-10-CM

## 2017-02-24 DIAGNOSIS — M19011 Primary osteoarthritis, right shoulder: Secondary | ICD-10-CM | POA: Diagnosis not present

## 2017-02-27 ENCOUNTER — Other Ambulatory Visit: Payer: Medicare HMO | Admitting: *Deleted

## 2017-02-27 ENCOUNTER — Ambulatory Visit (INDEPENDENT_AMBULATORY_CARE_PROVIDER_SITE_OTHER): Payer: Medicare HMO | Admitting: *Deleted

## 2017-02-27 DIAGNOSIS — R69 Illness, unspecified: Secondary | ICD-10-CM | POA: Diagnosis not present

## 2017-02-27 DIAGNOSIS — I5022 Chronic systolic (congestive) heart failure: Secondary | ICD-10-CM | POA: Diagnosis not present

## 2017-02-27 DIAGNOSIS — I255 Ischemic cardiomyopathy: Secondary | ICD-10-CM | POA: Diagnosis not present

## 2017-02-27 DIAGNOSIS — I493 Ventricular premature depolarization: Secondary | ICD-10-CM

## 2017-02-27 LAB — CUP PACEART INCLINIC DEVICE CHECK
HighPow Impedance: 46.3648
Implantable Lead Implant Date: 20180305
Implantable Lead Model: 7121
Lead Channel Impedance Value: 437.5 Ohm
Lead Channel Pacing Threshold Amplitude: 0.5 V
Lead Channel Pacing Threshold Amplitude: 0.5 V
Lead Channel Pacing Threshold Amplitude: 1.25 V
Lead Channel Pacing Threshold Amplitude: 1.25 V
Lead Channel Pacing Threshold Pulse Width: 0.5 ms
Lead Channel Pacing Threshold Pulse Width: 0.5 ms
Lead Channel Sensing Intrinsic Amplitude: 10.6 mV
Lead Channel Sensing Intrinsic Amplitude: 3.4 mV
Lead Channel Setting Pacing Amplitude: 1.375
Lead Channel Setting Pacing Amplitude: 2.125
Lead Channel Setting Pacing Pulse Width: 0.5 ms
MDC IDC LEAD IMPLANT DT: 20090427
MDC IDC LEAD LOCATION: 753859
MDC IDC LEAD LOCATION: 753860
MDC IDC MSMT LEADCHNL RA PACING THRESHOLD PULSEWIDTH: 0.5 ms
MDC IDC MSMT LEADCHNL RA PACING THRESHOLD PULSEWIDTH: 0.5 ms
MDC IDC MSMT LEADCHNL RV IMPEDANCE VALUE: 300 Ohm
MDC IDC PG IMPLANT DT: 20180305
MDC IDC PG SERIAL: 1247854
MDC IDC SESS DTM: 20180319161005
MDC IDC SET LEADCHNL RV SENSING SENSITIVITY: 0.5 mV
MDC IDC STAT BRADY RA PERCENT PACED: 92 %
MDC IDC STAT BRADY RV PERCENT PACED: 9.4 %

## 2017-02-27 NOTE — Progress Notes (Signed)
Wound check appointment. Steri-strips removed. Wound without redness or edema. Incision edges approximated, wound well healed. Scabbed abrasions d/t tegaderm noted above and below device pocket--healing normally without drainage. Normal device function. Thresholds, sensing, and impedances consistent with implant measurements. Device programmed at 3.5V (RA) for extra safety margin until 3 month visit. RV autocap programmed on at implant. Histogram distribution appropriate for patient and level of activity. No mode switches or ventricular arrhythmias noted. Patient educated about wound care, arm mobility, lifting restrictions, shock plan. ROV in 3 months with SK.

## 2017-02-28 LAB — MAGNESIUM: Magnesium: 2.3 mg/dL (ref 1.6–2.3)

## 2017-03-08 DIAGNOSIS — M19011 Primary osteoarthritis, right shoulder: Secondary | ICD-10-CM | POA: Diagnosis not present

## 2017-03-13 DIAGNOSIS — H5213 Myopia, bilateral: Secondary | ICD-10-CM | POA: Diagnosis not present

## 2017-03-13 DIAGNOSIS — H01001 Unspecified blepharitis right upper eyelid: Secondary | ICD-10-CM | POA: Diagnosis not present

## 2017-03-13 DIAGNOSIS — H25813 Combined forms of age-related cataract, bilateral: Secondary | ICD-10-CM | POA: Diagnosis not present

## 2017-03-13 DIAGNOSIS — H16102 Unspecified superficial keratitis, left eye: Secondary | ICD-10-CM | POA: Diagnosis not present

## 2017-03-17 DIAGNOSIS — R0789 Other chest pain: Secondary | ICD-10-CM | POA: Diagnosis not present

## 2017-04-04 ENCOUNTER — Encounter: Payer: Self-pay | Admitting: Interventional Cardiology

## 2017-04-05 DIAGNOSIS — R0789 Other chest pain: Secondary | ICD-10-CM | POA: Diagnosis not present

## 2017-04-25 ENCOUNTER — Encounter (INDEPENDENT_AMBULATORY_CARE_PROVIDER_SITE_OTHER): Payer: Self-pay

## 2017-04-25 ENCOUNTER — Encounter: Payer: Self-pay | Admitting: Interventional Cardiology

## 2017-04-25 ENCOUNTER — Ambulatory Visit (INDEPENDENT_AMBULATORY_CARE_PROVIDER_SITE_OTHER): Payer: Medicare HMO | Admitting: Interventional Cardiology

## 2017-04-25 VITALS — BP 128/82 | HR 60 | Ht 68.5 in | Wt 187.1 lb

## 2017-04-25 DIAGNOSIS — E782 Mixed hyperlipidemia: Secondary | ICD-10-CM

## 2017-04-25 DIAGNOSIS — I252 Old myocardial infarction: Secondary | ICD-10-CM | POA: Diagnosis not present

## 2017-04-25 DIAGNOSIS — I2510 Atherosclerotic heart disease of native coronary artery without angina pectoris: Secondary | ICD-10-CM

## 2017-04-25 DIAGNOSIS — I10 Essential (primary) hypertension: Secondary | ICD-10-CM | POA: Diagnosis not present

## 2017-04-25 MED ORDER — NITROGLYCERIN 0.4 MG SL SUBL: 0.4000 mg | SUBLINGUAL_TABLET | SUBLINGUAL | 1 refills | Status: DC | PRN

## 2017-04-25 NOTE — Progress Notes (Signed)
Cardiology Office Note   Date:  04/25/2017   ID:  Joel Lin, DOB [birth year], MRN [id number]  PCP:  [name], MD    No chief complaint on file. CAD   Wt Readings from Last 3 Encounters:  04/25/17 187 lb 1.9 oz (84.9 kg)  02/14/17 173 lb 11.2 oz (78.8 kg)  01/18/17 186 lb 12.8 oz (84.7 kg)       History of Present Illness: Joel Lin is a 76 y.o. male  He has a history of known ischemic heart disease. He has a history of a remote myocardial infarction at the age of 60. That was a large posterolateral myocardial infarction in 1981 in [place].  He moved to [place] in 1985.  The patient had angioplasty and stent of his LAD in 1999- Dr. Acie Fredrickson. In 2009 he had an out of hospital cardiac arrest at home was successfully resuscitated and treated with hypothermia without significant neurologic sequelae. No CAD by cath at that time.  He now has a defibrillator in place and is followed by Dr. Caryl Comes. He has a history of hypercholesterolemia. His last nuclear stress test was 10/24/11 and showed an ejection fraction of 45% and a large posterolateral scar but no ischemia. His last echocardiogram was in 2014 and showed an ejection fraction of 40%. On 12/06/13 the patient underwent a stent graft for his abdominal aortic aneurysm. The patient did well and was discharged without complications. The patient has his defibrillator monitored through Dr. Olin Pia office. In January 2013 he had 2 episodes of ventricular tachycardia which were asymptomatic. In March 2014 he was rehospitalized after having 3 consecutive shocks from his defibrillator. He underwent cardiac catheterization by Dr. Burt Knack on 02/28/13 who told him that his arteries have not shown any progression of atherosclerosis since his previous cardiac catheterization in 2009.   On 02/10/15 the patient presented to [facility] ED due to ICD shock. He stated he was playing golf and just finished 9 holes and was starting on the back 9  when he suddenly felt dizzy. The next thing he knows his device shocked him. He called 911 and went back to the clubhouse where EMS arrived and gave him four aspirin.  Pt was transferred to [facility] for EP eval. He did well overnight without any ICD shocks. Troponin was negative. He was seen by Dr. Caryl Comes and found stable for discharge. He had been placed on po amiodarone but Dr. Caryl Comes held on initiating amiodarone given relative infrequency of V tach.  AAA repair in 12/14, stent graft with Dr. Donnetta Hutching.  He was hospitalizated following a ICD discharge was on 06/24/15. During that admission his atenolol was stopped and he was switched to sotalol initially 80 mg twice a day and subsequently increased to 120 mg twice a day. More recently the dose was decreased again to 80 mg twice a day. He has had no further discharges from his defibrillator. Dr. Caryl Comes recently had him walk on the treadmill. The patient reports that he was able to get his heart rate up to 114.  He reports some neck pain- xray was done and he went to ortho and saw some arthritis.  Certain movements make it worse, including swinging arms during a brisk walk.  He is doing some exercises.  He had an AICD changeout in 3/18 with addition of an atrial lead.  His angina in the past has been very typical.    Since sotalol started, no AICD shocks. Tolerates 80 mg BID.  Bradycardiac at home.    He does not walk that much.  He also wants to lose wieght.    He stopped his NTG patches due to skin irritation in early 2017.  There was no change in sx so he went back to them.  He had been using them for years.  He may decide to stop them after this current supply and see if there is any difference.  He has been on the patches for over 30 years.  He has some neck pain related to sternocleidomastoid inflammation, intermittently.  THis pain is different than his angina.  It occurs with certain head movements.  He is not walking more since the last  visit.  No anginal pain.   BP readings at home are in the 573-220 range systolic.    Will start walking more soon.  EF 40-45% in 2/18.   He follows with Dr. Donnetta Hutching for AAA repair.       Past Medical History:  Diagnosis Date  . AAA (abdominal aortic aneurysm) ([place])   . Cardiac arrest ([place]) 03/29/2008   a. resuscitated OOH VF arrest  b. s/p STJ ICD  . Contrast media allergy   . Coronary artery disease    myoview 2011>>EF of 53% / large area of old inferolateral infarct but no reversible ischemia          . Hemorrhoid   . Hypercoagulable state ([place])    Questionable history of a borderline hypercoagulable state  . Hyperlipidemia   . Myocardial infarction ([place]) 1981  . Peripheral vascular disease ([place])   . PVCs (premature ventricular contractions)    occasionally  . Ventricular tachycardia ([place])    a. appropriate ICD therpay b. on Sotalol    Past Surgical History:  Procedure Laterality Date  . ABDOMINAL AORTIC ENDOVASCULAR STENT GRAFT N/A 12/06/2013   Procedure: ABDOMINAL AORTIC ENDOVASCULAR STENT GRAFT;  Surgeon: Rosetta Posner, MD;  Location: [place];  Service: Vascular;  Laterality: N/A;  . CARDIAC CATHETERIZATION  04/04/2008    LAD stent patent, D1 OK, OM1 90/80/80% (62mm vessel), RCA 30%; EF 35%.  Marland Kitchen CARDIAC DEFIBRILLATOR PLACEMENT  04/07/2008    STJ single chamber ICD implanted for secondary prevention  . CHOLECYSTECTOMY    . CHOLECYSTECTOMY, LAPAROSCOPIC  08/13/2009   Calculous cholecystitis  . CORONARY ANGIOPLASTY WITH STENT PLACEMENT  1999   PTCA and stenting of his left anterior descending artery  . ICD REVISION N/A 02/13/2017   Procedure: Upgrade to Dual Chamber ICD;  Surgeon: Will Meredith Leeds, MD;  Location: [place] [facility];  Service: Cardiovascular;  Laterality: N/A;  . LEFT HEART CATHETERIZATION WITH CORONARY ANGIOGRAM N/A 02/28/2013   Procedure: LEFT HEART CATHETERIZATION WITH CORONARY ANGIOGRAM;  Surgeon: Sherren Mocha, MD;  Location: [facility] C[facility];  Service:  Cardiovascular;  Laterality: N/A;  . TONSILLECTOMY       Current Outpatient Prescriptions  Medication Sig Dispense Refill  . aspirin 81 MG tablet Take 81 mg by mouth every evening.     . carvedilol (COREG) 3.125 MG tablet Take 1 tablet (3.125 mg total) by mouth 2 (two) times daily. 180 tablet 2  . cholecalciferol (VITAMIN D) 1000 units tablet Take 1,000 Units by mouth daily.    . colesevelam (WELCHOL) 625 MG tablet Take 1 tablet (625 mg  total) by mouth 2 (two)  times daily with a meal. 180 tablet 2  . fexofenadine (ALLEGRA) 180 MG tablet Take 180 mg by mouth daily.    . fish oil-omega-3 fatty  acids 1000 MG capsule Take 1 g by mouth daily.     . fluticasone (FLONASE) 50 MCG/ACT nasal spray Place 1 spray into both nostrils daily as needed for allergies or rhinitis.    . Glucosamine Sulfate-MSM (GLUCOSAMINE-MSM DS PO) Take 1 tablet by mouth daily.    Marland Kitchen ibuprofen (ADVIL,MOTRIN) 200 MG tablet Take 400 mg by mouth daily as needed for moderate pain.     Marland Kitchen lisinopril (PRINIVIL,ZESTRIL) 5 MG tablet Take 1 tablet (5 mg total) by mouth daily. 90 tablet 2  . Melatonin 5 MG CAPS Take 5 mg by mouth at bedtime as needed (sleep).    . Multiple Vitamin (MULTIVITAMIN WITH MINERALS) TABS Take 1 tablet by mouth daily.    . nitroGLYCERIN (NITRODUR - DOSED IN MG/24 HR) 0.2 mg/hr patch Place 1 patch (0.2 mg total) onto the skin daily. 90 patch 2  . nitroGLYCERIN (NITROSTAT) 0.4 MG SL tablet Place 1 tablet (0.4 mg total) under the tongue every 5 (five) minutes as needed for chest pain ((MAX 3 DOSES)). 25 tablet 3  . rosuvastatin (CRESTOR) 5 MG tablet Take 5 mg by mouth every other day.    . sotalol (BETAPACE) 80 MG tablet Take 1 tablet (80 mg total) by mouth 2 (two) times daily. 180 tablet 2   No current facility-administered medications for this visit.     Allergies:   Epinephrine; Losartan; Metoprolol; Mydriacyl [tropicamide]; Phenylephrine hcl; Bisoprolol; Clopidogrel bisulfate; Iodine; and Statins     Social History:  The patient  reports that he quit smoking about 37 years ago. His smoking use included Cigarettes. He has a 20.00 pack-year smoking history. He has never used smokeless tobacco. He reports that he does not drink alcohol or use drugs.   Family History:  The patient's family history includes Heart disease in his father and mother; Hyperlipidemia in his father and mother; Mitral valve prolapse in his sister.    ROS:  Please see the history of present illness.   Otherwise, review of systems are positive for back pain.   All other systems are reviewed and negative.    PHYSICAL EXAM: VS:  BP 128/82   Pulse 60   Ht 5' 8.5" (1.74 m)   Wt 187 lb 1.9 oz (84.9 kg)   BMI 28.04 kg/m  , BMI Body mass index is 28.04 kg/m. GEN: Well nourished, well developed, in no acute distress  HEENT: normal  Neck: no JVD, carotid bruits, or masses Cardiac: RRR; no murmurs, rubs, or gallops,no edema  Respiratory:  clear to auscultation bilaterally, normal work of breathing GI: soft, nontender, nondistended, + BS MS: no deformity or atrophy  Skin: warm and dry, no rash Neuro:  Strength and sensation are intact Psych: euthymic mood, full affect  ECG: NSR, anterolateral MI, no ST changes; low voltage  Recent Labs: 05/27/2016: ALT 35 01/18/2017: BUN 27; Creatinine, Ser 1.01; Potassium 4.4; Sodium 140 02/13/2017: Hemoglobin 14.5; Platelets 131 02/27/2017: Magnesium 2.3   Lipid Panel    Component Value Date/Time   CHOL 147 05/27/2016 0939   TRIG 166 (H) 05/27/2016 0939   HDL 39 (L) 05/27/2016 0939   CHOLHDL 3.8 05/27/2016 0939   VLDL 33 (H) 05/27/2016 0939   LDLCALC 75 05/27/2016 0939   LDLDIRECT 80.0 08/12/2015 0806     Other studies Reviewed: Additional studies/ records that were reviewed today with results demonstrating: EF 40% in 2014.   ASSESSMENT AND PLAN:  1.  Ischemic heart disease/CAD/Old MI: status post remote posterolateral myocardial infarction in  1981. Status post  angioplasty and stent of LAD in 1999.  EF 40%.  He is feeling fine off of the NTG patch.  He has restarted the NTG patch to complete his supply. 2. ICD in place following successful out of hospital ventricular fibrillation arrest and resuscitation in 2009.  Follwed by Dr Caryl Comes. 3. AAA: status post stent graft surgery for abdominal aortic aneurysm December 2014. Dr. Donnetta Hutching. 4. HYPERlipidemia : on low-dose Crestor.  Lipids well controlled in 8/16. Recheck labs today.   5. essential hypertension. Blood pressure now normal on low-dose sotolol and lisinopril.  Continue current meds.  Should also improve with exercise and weight loss.  Exercise recs below.    Current medicines are reviewed at length with the patient today.  The patient concerns regarding his medicines were addressed.  The following changes have been made:  No change  Labs/ tests ordered today include: Cmet , lipids No orders of the defined types were placed in this encounter.   Recommend 150 minutes/week of aerobic exercise Low fat, low carb, high fiber diet recommended  Disposition:   FU in 4 months; he requests 4 month f/u so he can stagger his appts with me and EP MD.   Signed, Larae Grooms, MD  04/25/2017 9:44 AM    [facility] [address], [place], [place]  [address] Phone: [phone]; Fax: [phone]

## 2017-04-25 NOTE — Patient Instructions (Signed)
Medication Instructions:  Your physician recommends that you continue on your current medications as directed. Please refer to the Current Medication list given to you today.   Labwork: None ordered  Testing/Procedures: None ordered  Follow-Up: Your physician recommends that you schedule a follow-up appointment in: 4 months with Dr. Irish Lack.    Any Other Special Instructions Will Be Listed Below (If Applicable).     If you need a refill on your cardiac medications before your next appointment, please call your pharmacy.

## 2017-05-05 DIAGNOSIS — R972 Elevated prostate specific antigen [PSA]: Secondary | ICD-10-CM | POA: Diagnosis not present

## 2017-05-05 DIAGNOSIS — R102 Pelvic and perineal pain: Secondary | ICD-10-CM | POA: Diagnosis not present

## 2017-05-16 ENCOUNTER — Ambulatory Visit (INDEPENDENT_AMBULATORY_CARE_PROVIDER_SITE_OTHER): Payer: Medicare HMO | Admitting: Internal Medicine

## 2017-05-16 VITALS — BP 124/70 | HR 68 | Ht 69.0 in | Wt 188.0 lb

## 2017-05-16 DIAGNOSIS — I472 Ventricular tachycardia, unspecified: Secondary | ICD-10-CM

## 2017-05-16 DIAGNOSIS — Z95810 Presence of automatic (implantable) cardiac defibrillator: Secondary | ICD-10-CM | POA: Diagnosis not present

## 2017-05-16 DIAGNOSIS — Z79899 Other long term (current) drug therapy: Secondary | ICD-10-CM | POA: Diagnosis not present

## 2017-05-16 DIAGNOSIS — I469 Cardiac arrest, cause unspecified: Secondary | ICD-10-CM | POA: Diagnosis not present

## 2017-05-16 DIAGNOSIS — I255 Ischemic cardiomyopathy: Secondary | ICD-10-CM

## 2017-05-16 LAB — CUP PACEART INCLINIC DEVICE CHECK
Battery Remaining Longevity: 87 mo
Brady Statistic RA Percent Paced: 90 %
Brady Statistic RV Percent Paced: 14 %
Date Time Interrogation Session: 20180605161701
HIGH POWER IMPEDANCE MEASURED VALUE: 49.4511
Implantable Lead Implant Date: 20090427
Implantable Lead Implant Date: 20180305
Implantable Lead Location: 753860
Lead Channel Impedance Value: 325 Ohm
Lead Channel Impedance Value: 450 Ohm
Lead Channel Pacing Threshold Amplitude: 0.25 V
Lead Channel Pacing Threshold Amplitude: 1.5 V
Lead Channel Pacing Threshold Pulse Width: 0.5 ms
Lead Channel Pacing Threshold Pulse Width: 0.5 ms
Lead Channel Pacing Threshold Pulse Width: 0.5 ms
Lead Channel Sensing Intrinsic Amplitude: 4.5 mV
Lead Channel Setting Sensing Sensitivity: 0.5 mV
MDC IDC LEAD LOCATION: 753859
MDC IDC MSMT LEADCHNL RA PACING THRESHOLD AMPLITUDE: 0.25 V
MDC IDC MSMT LEADCHNL RV PACING THRESHOLD AMPLITUDE: 1.5 V
MDC IDC MSMT LEADCHNL RV PACING THRESHOLD PULSEWIDTH: 0.5 ms
MDC IDC MSMT LEADCHNL RV SENSING INTR AMPL: 12 mV
MDC IDC PG IMPLANT DT: 20180305
MDC IDC SET LEADCHNL RA PACING AMPLITUDE: 1.5 V
MDC IDC SET LEADCHNL RV PACING AMPLITUDE: 1.75 V
MDC IDC SET LEADCHNL RV PACING PULSEWIDTH: 0.5 ms
Pulse Gen Serial Number: 1247854

## 2017-05-16 NOTE — Patient Instructions (Signed)
Medication Instructions: - Your physician recommends that you continue on your current medications as directed. Please refer to the Current Medication list given to you today.  Labwork: - none ordered  Procedures/Testing: - none ordered  Follow-Up: - Remote monitoring is used to monitor your Pacemaker of ICD from home. This monitoring reduces the number of office visits required to check your device to one time per year. It allows [place] to keep an eye on the functioning of your device to ensure it is working properly. You are scheduled for a device check from home on 08/15/17. You may send your transmission at any time that day. If you have a wireless device, the transmission will be sent automatically. After your physician reviews your transmission, you will receive a postcard with your next transmission date.  - Your physician wants you to follow-up in: 9 months with Chanetta Marshall, NP for Dr. Caryl Comes. You will receive a reminder letter in the mail two months in advance. If you don't receive a letter, please call our office to schedule the follow-up appointment.   Any Additional Special Instructions Will Be Listed Below (If Applicable).     If you need a refill on your cardiac medications before your next appointment, please call your pharmacy.

## 2017-05-16 NOTE — Progress Notes (Signed)
Patient Care Team: [name], MD as PCP - General (Internal Medicine) Deboraha Sprang, MD as Attending Physician (Cardiology) Jettie Booze, MD as Consulting Physician (Cardiology)   HPI  Joel Lin is a 76 y.o. male is seen in followup for aborted sudden cardiac death in the setting of ischemic heart disease with  prior PCI and mild depression of LV systolic function. He is status post ICD implantation. His device reached ERI 2/18 and he underwent generator change with DDD upgrade secondary to symptomatic sinus bradycardia.  He was hospitalized 7/16 with ventricular tachycardia failed termination with ATP and for which he was shocked. A second episode of monomorphic ventricular tachycardia terminated on its own. He was started on sotalol and his atenolol was discontinued.  He underwent stent grafting of the abdominal aortic aneurysm   DATE TEST    11/12    myoview   EF 45 % Old inferolateral   1/14    echo   EF 40 %   218 Echo EF 40-45%         Date Cr K Mg  6/17  0.95 4.6  *  2/18  1.01 4.4 2.4     Appropriate Therapy yes -- VT ATP  Inappropriate Therapy yes-- sinus tachycardia Antiarrhythmics Date  sotalol        Since device generator replacement he has felt some better.  He has some sensation that his heart rate is too fast at rest.   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 Typically lasts 20-30 minutes following discontinuation of effort.      Past Medical History:  Diagnosis Date  . AAA (abdominal aortic aneurysm) ([place])   . Cardiac arrest ([place]) 03/29/2008   a. resuscitated OOH VF arrest  b. s/p STJ ICD  . Contrast media allergy   . Coronary artery disease    myoview 2011>>EF of 53% / large area of old inferolateral infarct but no reversible  ischemia          . Hemorrhoid   . Hypercoagulable state ([place])    Questionable history of a borderline hypercoagulable state  . Hyperlipidemia   . Myocardial infarction ([place]) 1981  . Peripheral vascular disease ([place])   . PVCs (premature ventricular contractions)    occasionally  . Ventricular tachycardia ([place])    a. appropriate ICD therpay b. on Sotalol    Past Surgical History:  Procedure Laterality Date  . ABDOMINAL AORTIC ENDOVASCULAR STENT GRAFT N/A 12/06/2013   Procedure: ABDOMINAL AORTIC ENDOVASCULAR STENT GRAFT;  Surgeon: Rosetta Posner, MD;  Location: [place];  Service: Vascular;  Laterality: N/A;  . CARDIAC CATHETERIZATION  04/04/2008    LAD stent patent, D1 OK, OM1 90/80/80% (58mm vessel), RCA 30%; EF 35%.  Marland Kitchen CARDIAC DEFIBRILLATOR PLACEMENT  04/07/2008    STJ single chamber ICD implanted for secondary prevention  . CHOLECYSTECTOMY    . CHOLECYSTECTOMY, LAPAROSCOPIC  08/13/2009   Calculous cholecystitis  . CORONARY ANGIOPLASTY WITH STENT PLACEMENT  1999   PTCA and stenting of his left anterior descending artery  . ICD REVISION N/A 02/13/2017   Procedure: Upgrade to Dual Chamber ICD;  Surgeon: Will Meredith Leeds, MD;  Location: [place] [facility];  Service: Cardiovascular;  Laterality: N/A;  . LEFT HEART CATHETERIZATION WITH CORONARY ANGIOGRAM N/A 02/28/2013  Procedure: LEFT HEART CATHETERIZATION WITH CORONARY ANGIOGRAM;  Surgeon: Sherren Mocha, MD;  Location: [facility] CATH LAB;  Service: Cardiovascular;  Laterality: N/A;  . TONSILLECTOMY      Current Outpatient Prescriptions  Medication Sig Dispense Refill  . aspirin 81 MG tablet Take 81 mg by mouth every evening.     . carvedilol (COREG) 3.125 MG tablet Take 1 tablet (3.125 mg total) by mouth 2 (two) times daily. 180 tablet 2  . cholecalciferol (VITAMIN D) 1000 units tablet Take 1,000 Units by mouth daily.    . colesevelam (WELCHOL) 625 MG tablet Take 1 tablet (625 mg  total) by mouth 2 (two)  times daily with a meal. 180 tablet  2  . fexofenadine (ALLEGRA) 180 MG tablet Take 180 mg by mouth daily.    . fish oil-omega-3 fatty acids 1000 MG capsule Take 1 g by mouth daily.     . fluticasone (FLONASE) 50 MCG/ACT nasal spray Place 1 spray into both nostrils daily as needed for allergies or rhinitis.    . Glucosamine Sulfate-MSM (GLUCOSAMINE-MSM DS PO) Take 1 tablet by mouth daily.    Marland Kitchen ibuprofen (ADVIL,MOTRIN) 200 MG tablet Take 400 mg by mouth daily as needed for moderate pain.     Marland Kitchen lisinopril (PRINIVIL,ZESTRIL) 5 MG tablet Take 1 tablet (5 mg total) by mouth daily. 90 tablet 2  . Melatonin 5 MG CAPS Take 5 mg by mouth at bedtime as needed (sleep).    . Multiple Vitamin (MULTIVITAMIN WITH MINERALS) TABS Take 1 tablet by mouth daily.    . nitroGLYCERIN (NITRODUR - DOSED IN MG/24 HR) 0.2 mg/hr patch Place 1 patch (0.2 mg total) onto the skin daily. 90 patch 2  . nitroGLYCERIN (NITROSTAT) 0.4 MG SL tablet Place 1 tablet (0.4 mg total) under the tongue every 5 (five) minutes as needed for chest pain ((MAX 3 DOSES)). 25 tablet 1  . rosuvastatin (CRESTOR) 5 MG tablet Take 5 mg by mouth every other day.    . sotalol (BETAPACE) 80 MG tablet Take 1 tablet (80 mg total) by mouth 2 (two) times daily. 180 tablet 2   No current facility-administered medications for this visit.     Allergies  Allergen Reactions  . Epinephrine Other (See Comments)    Elevated heartbeat  . Losartan Palpitations and Other (See Comments)    Irregular heartbeat  . Metoprolol Other (See Comments)    Irregular heartbeat  . Mydriacyl [Tropicamide] Other (See Comments)    Elevated heartbeat  . Phenylephrine Hcl Other (See Comments)    Increased heartrate  . Bisoprolol Other (See Comments)    Feels it does not agree with him  . Clopidogrel Bisulfate Hives       . Iodine Hives    "Renografin"  . Statins Other (See Comments)    Muscle cramps (crestor low dose is ok)    Review of Systems negative except from HPI and PMH  Physical Exam BP  124/70   Pulse 68   Ht 5\' 9"  (1.753 m)   Wt 188 lb (85.3 kg)   SpO2 96%   BMI 27.76 kg/m  Well developed and nourished in no acute distress HENT normal Neck supple with JVP-flat Carotids brisk and full without bruits Clear Tenderness at the right sternoclavicular junction Device pocket well healed; without hematoma or erythema.  There is no tethering  Regular rate and rhythm, no murmurs or gallops Abd-soft with active BS without hepatomegaly No Clubbing cyanosis edema Skin-warm and dry A & Oriented  Grossly normal sensory and motor function     ECG atrial paced @ 67 Intervals 28/12/38 Axis -64 Inferolateral infarct  Assessment and  Plan  Aborted cardiac arrest  Ischemic Cardiomyopathy  Ventricular tachycardia  Chest pain  Sinus node dysfunction   Implantable defibrillator The patient's device was interrogated.  The information was reviewed. No changes were made in the programming.    His chest pain is not consistent with cardiac pain.  We have reprogrammed his device so as to accelerate return to resting rates as well as decreasing his resting rate.  No interval ventricular tachycardia  Sinus excursion is supported by device function

## 2017-06-26 DIAGNOSIS — R972 Elevated prostate specific antigen [PSA]: Secondary | ICD-10-CM | POA: Diagnosis not present

## 2017-07-03 DIAGNOSIS — R102 Pelvic and perineal pain: Secondary | ICD-10-CM | POA: Diagnosis not present

## 2017-07-03 DIAGNOSIS — N401 Enlarged prostate with lower urinary tract symptoms: Secondary | ICD-10-CM | POA: Diagnosis not present

## 2017-07-03 DIAGNOSIS — R972 Elevated prostate specific antigen [PSA]: Secondary | ICD-10-CM | POA: Diagnosis not present

## 2017-07-03 DIAGNOSIS — R350 Frequency of micturition: Secondary | ICD-10-CM | POA: Diagnosis not present

## 2017-07-18 DIAGNOSIS — R102 Pelvic and perineal pain: Secondary | ICD-10-CM | POA: Diagnosis not present

## 2017-07-18 DIAGNOSIS — M62838 Other muscle spasm: Secondary | ICD-10-CM | POA: Diagnosis not present

## 2017-07-18 DIAGNOSIS — R350 Frequency of micturition: Secondary | ICD-10-CM | POA: Diagnosis not present

## 2017-07-18 DIAGNOSIS — M6281 Muscle weakness (generalized): Secondary | ICD-10-CM | POA: Diagnosis not present

## 2017-07-24 ENCOUNTER — Other Ambulatory Visit: Payer: Self-pay | Admitting: *Deleted

## 2017-07-24 MED ORDER — ROSUVASTATIN CALCIUM 5 MG PO TABS: 5.0000 mg | ORAL_TABLET | ORAL | 2 refills | Status: DC

## 2017-07-25 DIAGNOSIS — M6281 Muscle weakness (generalized): Secondary | ICD-10-CM | POA: Diagnosis not present

## 2017-07-25 DIAGNOSIS — M62838 Other muscle spasm: Secondary | ICD-10-CM | POA: Diagnosis not present

## 2017-07-25 DIAGNOSIS — R102 Pelvic and perineal pain: Secondary | ICD-10-CM | POA: Diagnosis not present

## 2017-07-25 DIAGNOSIS — R300 Dysuria: Secondary | ICD-10-CM | POA: Diagnosis not present

## 2017-08-02 DIAGNOSIS — M62838 Other muscle spasm: Secondary | ICD-10-CM | POA: Diagnosis not present

## 2017-08-02 DIAGNOSIS — R300 Dysuria: Secondary | ICD-10-CM | POA: Diagnosis not present

## 2017-08-02 DIAGNOSIS — M6281 Muscle weakness (generalized): Secondary | ICD-10-CM | POA: Diagnosis not present

## 2017-08-02 DIAGNOSIS — R102 Pelvic and perineal pain: Secondary | ICD-10-CM | POA: Diagnosis not present

## 2017-08-02 DIAGNOSIS — R350 Frequency of micturition: Secondary | ICD-10-CM | POA: Diagnosis not present

## 2017-08-07 DIAGNOSIS — M9903 Segmental and somatic dysfunction of lumbar region: Secondary | ICD-10-CM | POA: Diagnosis not present

## 2017-08-07 DIAGNOSIS — M9902 Segmental and somatic dysfunction of thoracic region: Secondary | ICD-10-CM | POA: Diagnosis not present

## 2017-08-07 DIAGNOSIS — M542 Cervicalgia: Secondary | ICD-10-CM | POA: Diagnosis not present

## 2017-08-07 DIAGNOSIS — M9901 Segmental and somatic dysfunction of cervical region: Secondary | ICD-10-CM | POA: Diagnosis not present

## 2017-08-08 DIAGNOSIS — M9901 Segmental and somatic dysfunction of cervical region: Secondary | ICD-10-CM | POA: Diagnosis not present

## 2017-08-08 DIAGNOSIS — M9902 Segmental and somatic dysfunction of thoracic region: Secondary | ICD-10-CM | POA: Diagnosis not present

## 2017-08-08 DIAGNOSIS — M9903 Segmental and somatic dysfunction of lumbar region: Secondary | ICD-10-CM | POA: Diagnosis not present

## 2017-08-08 DIAGNOSIS — M542 Cervicalgia: Secondary | ICD-10-CM | POA: Diagnosis not present

## 2017-08-09 DIAGNOSIS — M9902 Segmental and somatic dysfunction of thoracic region: Secondary | ICD-10-CM | POA: Diagnosis not present

## 2017-08-09 DIAGNOSIS — M542 Cervicalgia: Secondary | ICD-10-CM | POA: Diagnosis not present

## 2017-08-09 DIAGNOSIS — M9903 Segmental and somatic dysfunction of lumbar region: Secondary | ICD-10-CM | POA: Diagnosis not present

## 2017-08-09 DIAGNOSIS — M9901 Segmental and somatic dysfunction of cervical region: Secondary | ICD-10-CM | POA: Diagnosis not present

## 2017-08-10 DIAGNOSIS — M9902 Segmental and somatic dysfunction of thoracic region: Secondary | ICD-10-CM | POA: Diagnosis not present

## 2017-08-10 DIAGNOSIS — M542 Cervicalgia: Secondary | ICD-10-CM | POA: Diagnosis not present

## 2017-08-10 DIAGNOSIS — M9903 Segmental and somatic dysfunction of lumbar region: Secondary | ICD-10-CM | POA: Diagnosis not present

## 2017-08-10 DIAGNOSIS — M9901 Segmental and somatic dysfunction of cervical region: Secondary | ICD-10-CM | POA: Diagnosis not present

## 2017-08-15 ENCOUNTER — Ambulatory Visit (INDEPENDENT_AMBULATORY_CARE_PROVIDER_SITE_OTHER): Payer: Medicare HMO | Admitting: *Deleted

## 2017-08-15 DIAGNOSIS — I255 Ischemic cardiomyopathy: Secondary | ICD-10-CM

## 2017-08-16 DIAGNOSIS — M9901 Segmental and somatic dysfunction of cervical region: Secondary | ICD-10-CM | POA: Diagnosis not present

## 2017-08-16 DIAGNOSIS — M542 Cervicalgia: Secondary | ICD-10-CM | POA: Diagnosis not present

## 2017-08-16 DIAGNOSIS — M9902 Segmental and somatic dysfunction of thoracic region: Secondary | ICD-10-CM | POA: Diagnosis not present

## 2017-08-16 DIAGNOSIS — M9903 Segmental and somatic dysfunction of lumbar region: Secondary | ICD-10-CM | POA: Diagnosis not present

## 2017-08-16 NOTE — Progress Notes (Signed)
Remote ICD transmission.   

## 2017-08-17 DIAGNOSIS — M9902 Segmental and somatic dysfunction of thoracic region: Secondary | ICD-10-CM | POA: Diagnosis not present

## 2017-08-17 DIAGNOSIS — M9901 Segmental and somatic dysfunction of cervical region: Secondary | ICD-10-CM | POA: Diagnosis not present

## 2017-08-17 DIAGNOSIS — R350 Frequency of micturition: Secondary | ICD-10-CM | POA: Diagnosis not present

## 2017-08-17 DIAGNOSIS — M542 Cervicalgia: Secondary | ICD-10-CM | POA: Diagnosis not present

## 2017-08-17 DIAGNOSIS — M6281 Muscle weakness (generalized): Secondary | ICD-10-CM | POA: Diagnosis not present

## 2017-08-17 DIAGNOSIS — M9903 Segmental and somatic dysfunction of lumbar region: Secondary | ICD-10-CM | POA: Diagnosis not present

## 2017-08-17 DIAGNOSIS — M62838 Other muscle spasm: Secondary | ICD-10-CM | POA: Diagnosis not present

## 2017-08-17 DIAGNOSIS — R102 Pelvic and perineal pain: Secondary | ICD-10-CM | POA: Diagnosis not present

## 2017-08-18 DIAGNOSIS — M545 Low back pain: Secondary | ICD-10-CM | POA: Diagnosis not present

## 2017-08-18 DIAGNOSIS — R350 Frequency of micturition: Secondary | ICD-10-CM | POA: Diagnosis not present

## 2017-08-22 DIAGNOSIS — M9903 Segmental and somatic dysfunction of lumbar region: Secondary | ICD-10-CM | POA: Diagnosis not present

## 2017-08-22 DIAGNOSIS — M542 Cervicalgia: Secondary | ICD-10-CM | POA: Diagnosis not present

## 2017-08-22 DIAGNOSIS — M9901 Segmental and somatic dysfunction of cervical region: Secondary | ICD-10-CM | POA: Diagnosis not present

## 2017-08-22 DIAGNOSIS — M9902 Segmental and somatic dysfunction of thoracic region: Secondary | ICD-10-CM | POA: Diagnosis not present

## 2017-08-22 NOTE — Progress Notes (Signed)
Cardiology Office Note   Date:  08/23/2017   ID:  Joel Lin, DOB 16-Nov-1941, MRN [id number]  PCP:  [name], MD    No chief complaint on file. f/u chronic systolic heart failure   Wt Readings from Last 3 Encounters:  08/23/17 185 lb (83.9 kg)  05/16/17 188 lb (85.3 kg)  04/25/17 187 lb 1.9 oz (84.9 kg)       History of Present Illness: Joel Lin is a 76 y.o. male  has a history of known ischemic heart disease. He has a history of a remote myocardial infarction at the age of 40. That was a large posterolateral myocardial infarction in 1981 in [place].  He moved to [place] in 1985.  The patient had angioplasty and stent of his LAD in 1999- Dr. Acie Fredrickson. In 2009 he had an out of hospital cardiac arrest at home was successfully resuscitated and treated with hypothermia without significant neurologic sequelae. No CAD by cath at that time.  He now has a defibrillator in place and is followed by Dr. Caryl Comes. He has a history of hypercholesterolemia. His last nuclear stress test was 10/24/11 and showed an ejection fraction of 45% and a large posterolateral scar but no ischemia. His last echocardiogram was in 2014 and showed an ejection fraction of 40%. On 12/06/13 the patient underwent a stent graft for his abdominal aortic aneurysm. The patient did well and was discharged without complications. The patient has his defibrillator monitored through Dr. Olin Pia office. In January 2013 he had 2 episodes of ventricular tachycardia which were asymptomatic. In March 2014 he was rehospitalized after having 3 consecutive shocks from his defibrillator. He underwent cardiac catheterization by Dr. Burt Knack on 02/28/13 who told him that his arteries have not shown any progression of atherosclerosis since his previous cardiac catheterization in 2009.   On 02/10/15 the patient presented to [facility] ED due to ICD shock. He stated he was playing golf and just finished 9 holes and was starting on  the back 9 when he suddenly felt dizzy. The next thing he knows his device shocked him. He called 911 and went back to the clubhouse where EMS arrived and gave him four aspirin.  Pt was transferred to [facility] for EP eval. He did well overnight without any ICD shocks. Troponin was negative. He was seen by Dr. Caryl Comes and found stable for discharge. He had been placed on po amiodarone but Dr. Caryl Comes held on initiating amiodarone given relative infrequency of V tach.  AAA repair in 12/14, stent graft with Dr. Donnetta Hutching.  He was hospitalizated following a ICD discharge was on 06/24/15. During that admission his atenolol was stopped and he was switched to sotalol initially 80 mg twice a day and subsequently increased to 120 mg twice a day. More recently the dose was decreased again to 80 mg twice a day. He has had no further discharges from his defibrillator. The patient reports that he was able to get his heart rate up to 114.  He had an AICD changeout in 3/18 with addition of an atrial lead.  Since the last visit, he had his device was adjusted and the patient feels much better.  He has been walking regularly and does some elastic band exercises.    Denies : Chest pain. Dizziness. Leg edema. Nitroglycerin use. Orthopnea. Palpitations. Paroxysmal nocturnal dyspnea. Shortness of breath. Syncope.   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  No bleeding issues.   Not limiting salt intake but  no signs of fluid overload.    Past Medical History:  Diagnosis Date  . AAA (abdominal aortic aneurysm) ([place])   . Cardiac arrest ([place]) 03/29/2008   a. resuscitated OOH VF arrest  b. s/p STJ ICD  . Contrast media allergy   . Coronary artery disease    myoview 2011>>EF of 53% / large area of old inferolateral infarct but no reversible ischemia          . Hemorrhoid   . Hypercoagulable state ([place])    Questionable history of a borderline hypercoagulable state  . Hyperlipidemia   . Myocardial  infarction ([place]) 1981  . Peripheral vascular disease ([place])   . PVCs (premature ventricular contractions)    occasionally  . Ventricular tachycardia ([place])    a. appropriate ICD therpay b. on Sotalol    Past Surgical History:  Procedure Laterality Date  . ABDOMINAL AORTIC ENDOVASCULAR STENT GRAFT N/A 12/06/2013   Procedure: ABDOMINAL AORTIC ENDOVASCULAR STENT GRAFT;  Surgeon: Rosetta Posner, MD;  Location: [facility];  Service: Vascular;  Laterality: N/A;  . CARDIAC CATHETERIZATION  04/04/2008    LAD stent patent, D1 OK, OM1 90/80/80% (17mm vessel), RCA 30%; EF 35%.  Marland Kitchen CARDIAC DEFIBRILLATOR PLACEMENT  04/07/2008    STJ single chamber ICD implanted for secondary prevention  . CHOLECYSTECTOMY    . CHOLECYSTECTOMY, LAPAROSCOPIC  08/13/2009   Calculous cholecystitis  . CORONARY ANGIOPLASTY WITH STENT PLACEMENT  1999   PTCA and stenting of his left anterior descending artery  . ICD REVISION N/A 02/13/2017   Procedure: Upgrade to Dual Chamber ICD;  Surgeon: Will Meredith Leeds, MD;  Location: [facility];  Service: Cardiovascular;  Laterality: N/A;  . LEFT HEART CATHETERIZATION WITH CORONARY ANGIOGRAM N/A 02/28/2013   Procedure: LEFT HEART CATHETERIZATION WITH CORONARY ANGIOGRAM;  Surgeon: Sherren Mocha, MD;  Location[facility] CATH LAB;  Service: Cardiovascular;  Laterality: N/A;  . TONSILLECTOMY       Current Outpatient Prescriptions  Medication Sig Dispense Refill  . aspirin 81 MG tablet Take 81 mg by mouth every evening.     . carvedilol (COREG) 3.125 MG tablet Take 1 tablet (3.125 mg total) by mouth 2 (two) times daily. 180 tablet 2  . cholecalciferol (VITAMIN D) 1000 units tablet Take 1,000 Units by mouth daily.    . colesevelam (WELCHOL) 625 MG tablet Take 1 tablet (625 mg  total) by mouth 2 (two)  times daily with a meal. 180 tablet 2  . fexofenadine (ALLEGRA) 180 MG tablet Take 180 mg by mouth daily.    . fish oil-omega-3 fatty acids 1000 MG capsule Take 1 g by mouth daily.     .  fluticasone (FLONASE) 50 MCG/ACT nasal spray Place 1 spray into both nostrils daily as needed for allergies or rhinitis.    . Glucosamine Sulfate-MSM (GLUCOSAMINE-MSM DS PO) Take 1 tablet by mouth daily.    Marland Kitchen ibuprofen (ADVIL,MOTRIN) 200 MG tablet Take 400 mg by mouth daily as needed for moderate pain.     Marland Kitchen lisinopril (PRINIVIL,ZESTRIL) 5 MG tablet Take 1 tablet (5 mg total) by mouth daily. 90 tablet 2  . Melatonin 5 MG CAPS Take 5 mg by mouth at bedtime as needed (sleep).    . Multiple Vitamin (MULTIVITAMIN WITH MINERALS) TABS Take 1 tablet by mouth daily.    . nitroGLYCERIN (NITRODUR - DOSED IN MG/24 HR) 0.2 mg/hr patch Place 1 patch (0.2 mg total) onto the skin daily. 90 patch 2  . rosuvastatin (CRESTOR) 5 MG tablet  Take 1 tablet (5 mg total) by mouth every other day. 45 tablet 2  . sotalol (BETAPACE) 80 MG tablet Take 1 tablet (80 mg total) by mouth 2 (two) times daily. 180 tablet 2  . nitroGLYCERIN (NITROSTAT) 0.4 MG SL tablet Place 1 tablet (0.4 mg total) under the tongue every 5 (five) minutes as needed for chest pain ((MAX 3 DOSES)). 25 tablet 1   No current facility-administered medications for this visit.     Allergies:   Epinephrine; Losartan; Metoprolol; Mydriacyl [tropicamide]; Phenylephrine hcl; Bisoprolol; Clopidogrel bisulfate; Iodine; and Statins    Social History:  The patient  reports that he quit smoking about 37 years ago. His smoking use included Cigarettes. He has a 20.00 pack-year smoking history. He has never used smokeless tobacco. He reports that he does not drink alcohol or use drugs.   Family History:  The patient's family history includes Heart disease in his father and mother; Hyperlipidemia in his father and mother; Mitral valve prolapse in his sister; Stomach cancer in his unknown relative.    ROS:  Please see the history of present illness.   Otherwise, review of systems are positive for improved exercise tolerance.   All other systems are reviewed and  negative.    PHYSICAL EXAM: VS:  BP 124/72   Pulse (!) 56   Ht 5\' 9"  (1.753 m)   Wt 185 lb (83.9 kg)   BMI 27.32 kg/m  , BMI Body mass index is 27.32 kg/m. GEN: Well nourished, well developed, in no acute distress  HEENT: normal  Neck: no JVD, carotid bruits, or masses Cardiac: RRR; no murmurs, rubs, or gallops,no edema  Respiratory:  clear to auscultation bilaterally, normal work of breathing GI: soft, nontender, nondistended, + BS MS: no deformity or atrophy  Skin: warm and dry, no rash Neuro:  Strength and sensation are intact Psych: euthymic mood, full affect    Recent Labs: 01/18/2017: BUN 27; Creatinine, Ser 1.01; Potassium 4.4; Sodium 140 02/13/2017: Hemoglobin 14.5; Platelets 131 02/27/2017: Magnesium 2.3   Lipid Panel    Component Value Date/Time   CHOL 147 05/27/2016 0939   TRIG 166 (H) 05/27/2016 0939   HDL 39 (L) 05/27/2016 0939   CHOLHDL 3.8 05/27/2016 0939   VLDL 33 (H) 05/27/2016 0939   LDLCALC 75 05/27/2016 0939   LDLDIRECT 80.0 08/12/2015 0806     Other studies Reviewed: Additional studies/ records that were reviewed today with results demonstrating: .   ASSESSMENT AND PLAN:  1. Chronic systolic heart failure: EF 40-45% in February 2018.  He had an MI in 50. He had a stent placed in the LAD in 1999.  Euvolemic. 2. CAD: As above. No angina on medical therapy. He has been on a nitroglycerin patch in the past, and restarted this out of habit.  3. AAA: Stent graft surgery with Dr. early performed in 2014 December. 4. Hyperlipidemia: LDL 75 in June 2017.  Checked in 2/18 and LDL 76, TG 123 at that time. 5. AICD: followed by Dr. Caryl Comes.   Current medicines are reviewed at length with the patient today.  The patient concerns regarding his medicines were addressed.  The following changes have been made:  No change  Labs/ tests ordered today include:  No orders of the defined types were placed in this encounter.   Recommend 150 minutes/week of aerobic  exercise Low fat, low carb, high fiber diet recommended  Disposition:   FU in 6 months   Signed, Larae Grooms, MD  08/23/2017  [facility] [address], [place], [place]  [address] Phone: [phone]; Fax: [phone]

## 2017-08-23 ENCOUNTER — Encounter: Payer: Self-pay | Admitting: Cardiology

## 2017-08-23 ENCOUNTER — Encounter: Payer: Self-pay | Admitting: Interventional Cardiology

## 2017-08-23 ENCOUNTER — Ambulatory Visit (INDEPENDENT_AMBULATORY_CARE_PROVIDER_SITE_OTHER): Payer: Medicare HMO | Admitting: Interventional Cardiology

## 2017-08-23 VITALS — BP 124/72 | HR 56 | Ht 69.0 in | Wt 185.0 lb

## 2017-08-23 DIAGNOSIS — I714 Abdominal aortic aneurysm, without rupture, unspecified: Secondary | ICD-10-CM

## 2017-08-23 DIAGNOSIS — E782 Mixed hyperlipidemia: Secondary | ICD-10-CM | POA: Diagnosis not present

## 2017-08-23 DIAGNOSIS — I5022 Chronic systolic (congestive) heart failure: Secondary | ICD-10-CM | POA: Diagnosis not present

## 2017-08-23 DIAGNOSIS — I25119 Atherosclerotic heart disease of native coronary artery with unspecified angina pectoris: Secondary | ICD-10-CM

## 2017-08-23 MED ORDER — COLESEVELAM HCL 625 MG PO TABS: 625.0000 mg | ORAL_TABLET | Freq: Two times a day (BID) | ORAL | 3 refills | Status: DC

## 2017-08-23 MED ORDER — CARVEDILOL 3.125 MG PO TABS: 3.1250 mg | ORAL_TABLET | Freq: Two times a day (BID) | ORAL | 3 refills | Status: DC

## 2017-08-23 MED ORDER — SOTALOL HCL 80 MG PO TABS: 80.0000 mg | ORAL_TABLET | Freq: Two times a day (BID) | ORAL | 3 refills | Status: DC

## 2017-08-23 MED ORDER — LISINOPRIL 5 MG PO TABS: 5.0000 mg | ORAL_TABLET | Freq: Every day | ORAL | 3 refills | Status: DC

## 2017-08-23 MED ORDER — NITROGLYCERIN 0.2 MG/HR TD PT24: 0.2000 mg | MEDICATED_PATCH | Freq: Every day | TRANSDERMAL | 3 refills | Status: DC

## 2017-08-23 NOTE — Patient Instructions (Signed)

## 2017-08-29 LAB — CUP PACEART REMOTE DEVICE CHECK
Brady Statistic AP VS Percent: 29 %
Brady Statistic AS VP Percent: 2.5 %
Brady Statistic AS VS Percent: 66 %
Brady Statistic RA Percent Paced: 29 %
Date Time Interrogation Session: 20180904060021
HIGH POWER IMPEDANCE MEASURED VALUE: 47 Ohm
HighPow Impedance: 47 Ohm
Implantable Lead Implant Date: 20090427
Implantable Lead Location: 753860
Implantable Pulse Generator Implant Date: 20180305
Lead Channel Impedance Value: 440 Ohm
Lead Channel Pacing Threshold Amplitude: 0.375 V
Lead Channel Pacing Threshold Pulse Width: 0.5 ms
Lead Channel Sensing Intrinsic Amplitude: 1.8 mV
Lead Channel Setting Sensing Sensitivity: 0.5 mV
MDC IDC LEAD IMPLANT DT: 20180305
MDC IDC LEAD LOCATION: 753859
MDC IDC MSMT BATTERY REMAINING LONGEVITY: 91 mo
MDC IDC MSMT BATTERY REMAINING PERCENTAGE: 93 %
MDC IDC MSMT BATTERY VOLTAGE: 3.17 V
MDC IDC MSMT LEADCHNL RV IMPEDANCE VALUE: 300 Ohm
MDC IDC MSMT LEADCHNL RV PACING THRESHOLD AMPLITUDE: 1.5 V
MDC IDC MSMT LEADCHNL RV PACING THRESHOLD PULSEWIDTH: 0.5 ms
MDC IDC MSMT LEADCHNL RV SENSING INTR AMPL: 10.3 mV
MDC IDC SET LEADCHNL RA PACING AMPLITUDE: 1.375
MDC IDC SET LEADCHNL RV PACING AMPLITUDE: 1.75 V
MDC IDC SET LEADCHNL RV PACING PULSEWIDTH: 0.5 ms
MDC IDC STAT BRADY AP VP PERCENT: 2.2 %
MDC IDC STAT BRADY RV PERCENT PACED: 4.7 %
Pulse Gen Serial Number: 1247854

## 2017-08-30 DIAGNOSIS — M9903 Segmental and somatic dysfunction of lumbar region: Secondary | ICD-10-CM | POA: Diagnosis not present

## 2017-08-30 DIAGNOSIS — M9902 Segmental and somatic dysfunction of thoracic region: Secondary | ICD-10-CM | POA: Diagnosis not present

## 2017-08-30 DIAGNOSIS — M9901 Segmental and somatic dysfunction of cervical region: Secondary | ICD-10-CM | POA: Diagnosis not present

## 2017-08-30 DIAGNOSIS — M542 Cervicalgia: Secondary | ICD-10-CM | POA: Diagnosis not present

## 2017-08-31 DIAGNOSIS — R102 Pelvic and perineal pain: Secondary | ICD-10-CM | POA: Diagnosis not present

## 2017-08-31 DIAGNOSIS — R300 Dysuria: Secondary | ICD-10-CM | POA: Diagnosis not present

## 2017-08-31 DIAGNOSIS — M6281 Muscle weakness (generalized): Secondary | ICD-10-CM | POA: Diagnosis not present

## 2017-08-31 DIAGNOSIS — M62838 Other muscle spasm: Secondary | ICD-10-CM | POA: Diagnosis not present

## 2017-08-31 DIAGNOSIS — R350 Frequency of micturition: Secondary | ICD-10-CM | POA: Diagnosis not present

## 2017-09-05 ENCOUNTER — Ambulatory Visit (HOSPITAL_COMMUNITY)
Admission: RE | Admit: 2017-09-05 | Discharge: 2017-09-05 | Disposition: A | Discharge status: Home / Self Care | Payer: Medicare HMO | Source: Ambulatory Visit | Attending: Family | Admitting: Family

## 2017-09-05 ENCOUNTER — Encounter: Payer: Self-pay | Admitting: Family

## 2017-09-05 ENCOUNTER — Ambulatory Visit (INDEPENDENT_AMBULATORY_CARE_PROVIDER_SITE_OTHER): Payer: Medicare HMO | Admitting: Family

## 2017-09-05 VITALS — BP 140/85 | HR 62 | Temp 97.4°F | Resp 20 | Ht 69.0 in | Wt 186.0 lb

## 2017-09-05 DIAGNOSIS — I714 Abdominal aortic aneurysm, without rupture, unspecified: Secondary | ICD-10-CM

## 2017-09-05 DIAGNOSIS — Z95828 Presence of other vascular implants and grafts: Secondary | ICD-10-CM | POA: Diagnosis not present

## 2017-09-05 DIAGNOSIS — Z87891 Personal history of nicotine dependence: Secondary | ICD-10-CM

## 2017-09-05 NOTE — Patient Instructions (Signed)
Before your next abdominal ultrasound:  Take two Extra-Strength Gas-X capsules at bedtime the night before the test. Take another two Extra-Strength Gas-X capsules 3 hours before the test.  Avoid gas forming foods the day before the test.       

## 2017-09-05 NOTE — Progress Notes (Signed)
[facility]  CC: Follow up s/p Endovascular Repair of Abdominal Aortic Aneurysm    History of Present Illness  Joel Lin is a 76 y.o. (May 07, 1941) male who returns today for follow-up of stent graft repair of infrarenal abdominal aortic aneurysm by Dr. Donnetta Hutching on 12/06/2013.   He is here today with his wife.  He does report that his defibrillator fired for V. tach in March 2016 and July 2016. He has had some adjustment in his medical treatment. He has no symptoms referable to his aneurysm. Dr. Donnetta Hutching last saw pt on 07/28/15. At that time maximal diameter by duplex decreased to 4.8 cm from 5.1 cm. He takes  Daily 81 mg ASA, a statin, and a beta blocker. He walks 1.5 miles most mornings.  He denies any hx of claudication with walking, denies a hx of stroke or TIA.  He denies any light-headedness, denies chest pain, denies dyspnea.  Pt Diabetic: No Pt smoker: former smoker, quit in 1981, smoked x 20 years   Past Medical History:  Diagnosis Date  . AAA (abdominal aortic aneurysm) ([place])   . Cardiac arrest ([place]) 03/29/2008   a. resuscitated OOH VF arrest  b. s/p STJ ICD  . Contrast media allergy   . Coronary artery disease    myoview 2011>>EF of 53% / large area of old inferolateral infarct but no reversible ischemia          . Hemorrhoid   . Hypercoagulable state ([place])    Questionable history of a borderline hypercoagulable state  . Hyperlipidemia   . Myocardial infarction ([place]) 1981  . Peripheral vascular disease ([place])   . PVCs (premature ventricular contractions)    occasionally  . Ventricular tachycardia ([place])    a. appropriate ICD therpay b. on Sotalol   Past Surgical History:  Procedure Laterality Date  . ABDOMINAL AORTIC ENDOVASCULAR STENT GRAFT N/A 12/06/2013   Procedure: ABDOMINAL AORTIC ENDOVASCULAR STENT GRAFT;  Surgeon: Rosetta Posner, MD;  Location: [facility];  Service: Vascular;  Laterality: N/A;  . CARDIAC CATHETERIZATION  04/04/2008    LAD stent patent, D1 OK, OM1 90/80/80% (17mm vessel), RCA 30%; EF 35%.  Marland Kitchen CARDIAC DEFIBRILLATOR PLACEMENT  04/07/2008    STJ single chamber ICD implanted for secondary prevention  . CHOLECYSTECTOMY    . CHOLECYSTECTOMY, LAPAROSCOPIC  08/13/2009   Calculous cholecystitis  . CORONARY ANGIOPLASTY WITH STENT PLACEMENT  1999   PTCA and stenting of his left anterior descending artery  . ICD REVISION N/A 02/13/2017   Procedure: Upgrade to Dual Chamber ICD;  Surgeon: Will Meredith Leeds, MD;  Location: [facility];  Service: Cardiovascular;  Laterality: N/A;  . LEFT HEART CATHETERIZATION WITH CORONARY ANGIOGRAM N/A 02/28/2013   Procedure: LEFT HEART CATHETERIZATION WITH CORONARY ANGIOGRAM;  Surgeon: Sherren Mocha, MD;  Location: [facility] CATH LAB;  Service: Cardiovascular;  Laterality: N/A;  . TONSILLECTOMY     Social History Social History  Substance Use Topics  . Smoking status: Former Smoker    Packs/day: 1.00    Years: 20.00    Types: Cigarettes    Quit date: 12/23/1979  . Smokeless tobacco: Never Used  . Alcohol use No   Family History Family History  Problem Relation Age of Onset  . Hyperlipidemia Mother   . Heart disease Mother   . Heart disease Father        before age 59  . Hyperlipidemia Father   . Mitral valve prolapse Sister   . Stomach cancer  Unknown   . Heart attack Neg Hx   . Hypertension Neg Hx    Current Outpatient Prescriptions on File Prior to Visit  Medication Sig Dispense Refill  . aspirin 81 MG tablet Take 81 mg by mouth every evening.     . carvedilol (COREG) 3.125 MG tablet Take 1 tablet (3.125 mg total) by mouth 2 (two) times daily. 180 tablet 3  . cholecalciferol (VITAMIN D) 1000 units tablet Take 1,000 Units by mouth daily.    . colesevelam (WELCHOL) 625 MG tablet Take 1 tablet (625 mg total) by mouth 2 (two) times daily with a meal. 180 tablet 3  . fexofenadine (ALLEGRA) 180 MG tablet Take 180 mg by mouth daily.    . fish oil-omega-3 fatty acids 1000 MG  capsule Take 1 g by mouth daily.     . fluticasone (FLONASE) 50 MCG/ACT nasal spray Place 1 spray into both nostrils daily as needed for allergies or rhinitis.    . Glucosamine Sulfate-MSM (GLUCOSAMINE-MSM DS PO) Take 1 tablet by mouth daily.    Marland Kitchen ibuprofen (ADVIL,MOTRIN) 200 MG tablet Take 400 mg by mouth daily as needed for moderate pain.     Marland Kitchen lisinopril (PRINIVIL,ZESTRIL) 5 MG tablet Take 1 tablet (5 mg total) by mouth daily. 90 tablet 3  . Melatonin 5 MG CAPS Take 5 mg by mouth at bedtime as needed (sleep).    . Multiple Vitamin (MULTIVITAMIN WITH MINERALS) TABS Take 1 tablet by mouth daily.    . nitroGLYCERIN (NITRODUR - DOSED IN MG/24 HR) 0.2 mg/hr patch Place 1 patch (0.2 mg total) onto the skin daily. 90 patch 3  . nitroGLYCERIN (NITROSTAT) 0.4 MG SL tablet Place 1 tablet (0.4 mg total) under the tongue every 5 (five) minutes as needed for chest pain ((MAX 3 DOSES)). 25 tablet 1  . rosuvastatin (CRESTOR) 5 MG tablet Take 1 tablet (5 mg total) by mouth every other day. 45 tablet 2  . sotalol (BETAPACE) 80 MG tablet Take 1 tablet (80 mg total) by mouth 2 (two) times daily. 180 tablet 3   No current facility-administered medications on file prior to visit.    Allergies  Allergen Reactions  . Epinephrine Other (See Comments)    Elevated heartbeat  . Losartan Palpitations and Other (See Comments)    Irregular heartbeat  . Metoprolol Other (See Comments)    Irregular heartbeat  . Mydriacyl [Tropicamide] Other (See Comments)    Elevated heartbeat  . Phenylephrine Hcl Other (See Comments)    Increased heartrate  . Bisoprolol Other (See Comments)    Feels it does not agree with him  . Clopidogrel Bisulfate Hives       . Iodine Hives    "Renografin"  . Statins Other (See Comments)    Muscle cramps (crestor low dose is ok)     ROS: See HPI for pertinent positives and negatives.  Physical Examination  Vitals:   09/05/17 0856  BP: 140/85  Pulse: 62  Resp: 20  Temp: (!) 97.4  F (36.3 C)  TempSrc: Oral  SpO2: 96%  Weight: 186 lb (84.4 kg)  Height: 5\' 9"  (1.753 m)   Body mass index is 27.47 kg/m.  General: A&O x 3, WD male.  Pulmonary: Sym exp, respirations are non labored, good air movt, CTAB, no rales, rhonchi, or wheezing.  Cardiac: Regular rhythm and rate, no murmur appreciated. Pacemaker/defibrillator subcutaneous left upper chest.  Vascular: Vessel Right Left  Radial Palpable Palpable  Carotid  without bruit  without  bruit  Aorta Not palpable N/A  Femoral Palpable Palpable  Popliteal Not palpable Not palpable  PT Palpable Palpable  DP Palpable Palpable   Gastrointestinal: soft, NTND, -G/R, - HSM, - palpable masses, - CVAT B.  Musculoskeletal: M/S 5/5 throughout, extremities without ischemic changes.  Neurologic: Pain and light touch intact in extremities, Motor exam as listed above   DATA  EVAR Duplex (Date: 09/05/17):  AAA sac size: 3.97 cm; Right CIA: 1.22 cm; Left CIA: 1.19 cm  no endoleak detected Previous: Date: (08/30/16) AAA sac size: 4.02 cm x 3.58 cm, normal diameters of CIA's   Medical Decision Making  Joel Lin is a 76 y.o. male who presents s/p EVAR (Date: 12/06/2013).  Pt is asymptomatic with stable sac size.  I discussed with the patient the importance of surveillance of the endograft.  The next endograft duplex will be scheduled for 12 months.  The patient will follow up with [place] in 12 months with these studies.  I emphasized the importance of maximal medical management including strict control of blood pressure, blood glucose, and lipid levels, antiplatelet agents, obtaining regular exercise, and cessation of smoking.   Thank you for allowing [place] to participate in this patient's care.  Clemon Chambers, RN, MSN, FNP-C [facility] Office: [phone]  Clinic Physician: Early  09/05/2017, 8:57 AM

## 2017-09-06 ENCOUNTER — Other Ambulatory Visit: Payer: Self-pay | Admitting: *Deleted

## 2017-09-06 MED ORDER — WELCHOL 625 MG PO TABS: 625.0000 mg | ORAL_TABLET | Freq: Two times a day (BID) | ORAL | 3 refills | Status: DC

## 2017-09-06 NOTE — Telephone Encounter (Signed)
Patient had refills sent in for this medication but it was written for generic. He stated that he received a letter from his insurance company informing him that the brand name would be cheaper. He is aware that I will resend the rx for brand name. Patient appreciative.

## 2017-09-12 DIAGNOSIS — M6281 Muscle weakness (generalized): Secondary | ICD-10-CM | POA: Diagnosis not present

## 2017-09-12 DIAGNOSIS — R351 Nocturia: Secondary | ICD-10-CM | POA: Diagnosis not present

## 2017-09-12 DIAGNOSIS — R350 Frequency of micturition: Secondary | ICD-10-CM | POA: Diagnosis not present

## 2017-09-12 DIAGNOSIS — M62838 Other muscle spasm: Secondary | ICD-10-CM | POA: Diagnosis not present

## 2017-09-12 DIAGNOSIS — R102 Pelvic and perineal pain: Secondary | ICD-10-CM | POA: Diagnosis not present

## 2017-09-13 NOTE — Addendum Note (Signed)
Addended by: Lianne Cure A on: 09/13/2017 10:27 AM   Modules accepted: Orders

## 2017-09-14 DIAGNOSIS — R69 Illness, unspecified: Secondary | ICD-10-CM | POA: Diagnosis not present

## 2017-09-28 DIAGNOSIS — R102 Pelvic and perineal pain: Secondary | ICD-10-CM | POA: Diagnosis not present

## 2017-09-28 DIAGNOSIS — M6281 Muscle weakness (generalized): Secondary | ICD-10-CM | POA: Diagnosis not present

## 2017-09-28 DIAGNOSIS — R351 Nocturia: Secondary | ICD-10-CM | POA: Diagnosis not present

## 2017-09-28 DIAGNOSIS — M62838 Other muscle spasm: Secondary | ICD-10-CM | POA: Diagnosis not present

## 2017-09-28 DIAGNOSIS — R350 Frequency of micturition: Secondary | ICD-10-CM | POA: Diagnosis not present

## 2017-10-11 DIAGNOSIS — M6281 Muscle weakness (generalized): Secondary | ICD-10-CM | POA: Diagnosis not present

## 2017-10-11 DIAGNOSIS — R102 Pelvic and perineal pain: Secondary | ICD-10-CM | POA: Diagnosis not present

## 2017-10-11 DIAGNOSIS — R350 Frequency of micturition: Secondary | ICD-10-CM | POA: Diagnosis not present

## 2017-10-11 DIAGNOSIS — M62838 Other muscle spasm: Secondary | ICD-10-CM | POA: Diagnosis not present

## 2017-10-31 DIAGNOSIS — M62838 Other muscle spasm: Secondary | ICD-10-CM | POA: Diagnosis not present

## 2017-10-31 DIAGNOSIS — R300 Dysuria: Secondary | ICD-10-CM | POA: Diagnosis not present

## 2017-10-31 DIAGNOSIS — M6281 Muscle weakness (generalized): Secondary | ICD-10-CM | POA: Diagnosis not present

## 2017-10-31 DIAGNOSIS — R102 Pelvic and perineal pain: Secondary | ICD-10-CM | POA: Diagnosis not present

## 2017-11-14 ENCOUNTER — Ambulatory Visit (INDEPENDENT_AMBULATORY_CARE_PROVIDER_SITE_OTHER): Payer: Medicare HMO | Admitting: *Deleted

## 2017-11-14 DIAGNOSIS — I469 Cardiac arrest, cause unspecified: Secondary | ICD-10-CM | POA: Diagnosis not present

## 2017-11-14 LAB — CUP PACEART REMOTE DEVICE CHECK
Battery Voltage: 3.1 V
Brady Statistic AP VS Percent: 24 %
Brady Statistic AS VP Percent: 3.2 %
Brady Statistic RA Percent Paced: 24 %
Brady Statistic RV Percent Paced: 5.6 %
Date Time Interrogation Session: 20181204070015
HighPow Impedance: 50 Ohm
HighPow Impedance: 50 Ohm
Implantable Lead Implant Date: 20090427
Implantable Lead Location: 753859
Implantable Lead Model: 7121
Lead Channel Impedance Value: 340 Ohm
Lead Channel Impedance Value: 430 Ohm
Lead Channel Pacing Threshold Amplitude: 1.625 V
Lead Channel Pacing Threshold Pulse Width: 0.5 ms
Lead Channel Sensing Intrinsic Amplitude: 11.9 mV
Lead Channel Sensing Intrinsic Amplitude: 4.4 mV
Lead Channel Setting Pacing Amplitude: 1.375
Lead Channel Setting Pacing Amplitude: 1.875
Lead Channel Setting Pacing Pulse Width: 0.5 ms
MDC IDC LEAD IMPLANT DT: 20180305
MDC IDC LEAD LOCATION: 753860
MDC IDC MSMT BATTERY REMAINING LONGEVITY: 89 mo
MDC IDC MSMT BATTERY REMAINING PERCENTAGE: 90 %
MDC IDC MSMT LEADCHNL RA PACING THRESHOLD AMPLITUDE: 0.375 V
MDC IDC MSMT LEADCHNL RA PACING THRESHOLD PULSEWIDTH: 0.5 ms
MDC IDC PG IMPLANT DT: 20180305
MDC IDC SET LEADCHNL RV SENSING SENSITIVITY: 0.5 mV
MDC IDC STAT BRADY AP VP PERCENT: 2.4 %
MDC IDC STAT BRADY AS VS PERCENT: 70 %
Pulse Gen Serial Number: 1247854

## 2017-11-14 NOTE — Progress Notes (Signed)
Remote ICD transmission.   

## 2017-11-15 ENCOUNTER — Encounter: Payer: Self-pay | Admitting: Cardiology

## 2017-11-21 DIAGNOSIS — M62838 Other muscle spasm: Secondary | ICD-10-CM | POA: Diagnosis not present

## 2017-11-21 DIAGNOSIS — R300 Dysuria: Secondary | ICD-10-CM | POA: Diagnosis not present

## 2017-11-21 DIAGNOSIS — M6281 Muscle weakness (generalized): Secondary | ICD-10-CM | POA: Diagnosis not present

## 2017-11-21 DIAGNOSIS — R102 Pelvic and perineal pain: Secondary | ICD-10-CM | POA: Diagnosis not present

## 2017-12-07 DIAGNOSIS — R300 Dysuria: Secondary | ICD-10-CM | POA: Diagnosis not present

## 2017-12-07 DIAGNOSIS — R102 Pelvic and perineal pain: Secondary | ICD-10-CM | POA: Diagnosis not present

## 2017-12-07 DIAGNOSIS — M6281 Muscle weakness (generalized): Secondary | ICD-10-CM | POA: Diagnosis not present

## 2017-12-07 DIAGNOSIS — M62838 Other muscle spasm: Secondary | ICD-10-CM | POA: Diagnosis not present

## 2017-12-18 DIAGNOSIS — R972 Elevated prostate specific antigen [PSA]: Secondary | ICD-10-CM | POA: Diagnosis not present

## 2017-12-25 DIAGNOSIS — R350 Frequency of micturition: Secondary | ICD-10-CM | POA: Diagnosis not present

## 2017-12-25 DIAGNOSIS — R972 Elevated prostate specific antigen [PSA]: Secondary | ICD-10-CM | POA: Diagnosis not present

## 2017-12-25 DIAGNOSIS — N401 Enlarged prostate with lower urinary tract symptoms: Secondary | ICD-10-CM | POA: Diagnosis not present

## 2017-12-26 DIAGNOSIS — J069 Acute upper respiratory infection, unspecified: Secondary | ICD-10-CM | POA: Diagnosis not present

## 2017-12-27 DIAGNOSIS — M6281 Muscle weakness (generalized): Secondary | ICD-10-CM | POA: Diagnosis not present

## 2017-12-27 DIAGNOSIS — R300 Dysuria: Secondary | ICD-10-CM | POA: Diagnosis not present

## 2017-12-27 DIAGNOSIS — R102 Pelvic and perineal pain: Secondary | ICD-10-CM | POA: Diagnosis not present

## 2017-12-27 DIAGNOSIS — M62838 Other muscle spasm: Secondary | ICD-10-CM | POA: Diagnosis not present

## 2018-01-18 DIAGNOSIS — M62838 Other muscle spasm: Secondary | ICD-10-CM | POA: Diagnosis not present

## 2018-01-18 DIAGNOSIS — M6281 Muscle weakness (generalized): Secondary | ICD-10-CM | POA: Diagnosis not present

## 2018-01-18 DIAGNOSIS — R102 Pelvic and perineal pain: Secondary | ICD-10-CM | POA: Diagnosis not present

## 2018-01-18 DIAGNOSIS — R300 Dysuria: Secondary | ICD-10-CM | POA: Diagnosis not present

## 2018-01-18 DIAGNOSIS — R351 Nocturia: Secondary | ICD-10-CM | POA: Diagnosis not present

## 2018-02-04 ENCOUNTER — Other Ambulatory Visit: Payer: Self-pay | Admitting: Interventional Cardiology

## 2018-02-06 DIAGNOSIS — D225 Melanocytic nevi of trunk: Secondary | ICD-10-CM | POA: Diagnosis not present

## 2018-02-06 DIAGNOSIS — D1800 Hemangioma unspecified site: Secondary | ICD-10-CM | POA: Diagnosis not present

## 2018-02-06 DIAGNOSIS — L814 Other melanin hyperpigmentation: Secondary | ICD-10-CM | POA: Diagnosis not present

## 2018-02-06 DIAGNOSIS — L821 Other seborrheic keratosis: Secondary | ICD-10-CM | POA: Diagnosis not present

## 2018-02-06 DIAGNOSIS — L570 Actinic keratosis: Secondary | ICD-10-CM | POA: Diagnosis not present

## 2018-02-06 DIAGNOSIS — Z23 Encounter for immunization: Secondary | ICD-10-CM | POA: Diagnosis not present

## 2018-02-08 DIAGNOSIS — M62838 Other muscle spasm: Secondary | ICD-10-CM | POA: Diagnosis not present

## 2018-02-08 DIAGNOSIS — R300 Dysuria: Secondary | ICD-10-CM | POA: Diagnosis not present

## 2018-02-08 DIAGNOSIS — M6281 Muscle weakness (generalized): Secondary | ICD-10-CM | POA: Diagnosis not present

## 2018-02-08 DIAGNOSIS — R102 Pelvic and perineal pain: Secondary | ICD-10-CM | POA: Diagnosis not present

## 2018-02-13 ENCOUNTER — Ambulatory Visit (INDEPENDENT_AMBULATORY_CARE_PROVIDER_SITE_OTHER): Payer: Medicare HMO | Admitting: *Deleted

## 2018-02-13 DIAGNOSIS — I469 Cardiac arrest, cause unspecified: Secondary | ICD-10-CM | POA: Diagnosis not present

## 2018-02-13 NOTE — Progress Notes (Signed)
Remote ICD transmission.   

## 2018-02-14 ENCOUNTER — Encounter: Payer: Self-pay | Admitting: Cardiology

## 2018-02-14 DIAGNOSIS — I10 Essential (primary) hypertension: Secondary | ICD-10-CM | POA: Diagnosis not present

## 2018-02-14 DIAGNOSIS — Z0000 Encounter for general adult medical examination without abnormal findings: Secondary | ICD-10-CM | POA: Diagnosis not present

## 2018-02-14 DIAGNOSIS — I714 Abdominal aortic aneurysm, without rupture: Secondary | ICD-10-CM | POA: Diagnosis not present

## 2018-02-14 DIAGNOSIS — I255 Ischemic cardiomyopathy: Secondary | ICD-10-CM | POA: Diagnosis not present

## 2018-02-14 DIAGNOSIS — Z1389 Encounter for screening for other disorder: Secondary | ICD-10-CM | POA: Diagnosis not present

## 2018-02-14 DIAGNOSIS — I2510 Atherosclerotic heart disease of native coronary artery without angina pectoris: Secondary | ICD-10-CM | POA: Diagnosis not present

## 2018-02-14 DIAGNOSIS — Z23 Encounter for immunization: Secondary | ICD-10-CM | POA: Diagnosis not present

## 2018-02-14 DIAGNOSIS — E7800 Pure hypercholesterolemia, unspecified: Secondary | ICD-10-CM | POA: Diagnosis not present

## 2018-02-14 NOTE — Progress Notes (Signed)
Electrophysiology Office Note Date: 02/15/2018  ID:  THORNTON DOHRMANN, DOB 1941-10-28, MRN [id number]  PCP: [name], MD Primary Cardiologist: Irish Lack Electrophysiologist: Caryl Comes  CC: routine ICD follow up  Joel Lin is a 77 y.o. male seen today for Dr Caryl Comes.  He presents today for routine EP follow up.  Since last being seen in clinic, he reports doing very well. He denies chest pain, palpitations, dyspnea, PND, orthopnea, nausea, vomiting, dizziness, syncope, edema, weight gain, or early satiety.  He has not had ICD shocks.   Device History: STJ single chamber ICD implanted 2009 for secondary prevention History of appropriate therapy: yes, and inappropriate therapy  History of AAD therapy: yes - sotalol   Past Medical History:  Diagnosis Date  . AAA (abdominal aortic aneurysm) ([place])   . Cardiac arrest ([place]) 03/29/2008   a. resuscitated OOH VF arrest  b. s/p STJ ICD  . Contrast media allergy   . Coronary artery disease    myoview 2011>>EF of 53% / large area of old inferolateral infarct but no reversible ischemia          . Hemorrhoid   . Hypercoagulable state ([place])    Questionable history of a borderline hypercoagulable state  . Hyperlipidemia   . Myocardial infarction ([place]) 1981  . Peripheral vascular disease ([place])   . PVCs (premature ventricular contractions)    occasionally  . Ventricular tachycardia ([place])    a. appropriate ICD therpay b. on Sotalol   Past Surgical History:  Procedure Laterality Date  . ABDOMINAL AORTIC ENDOVASCULAR STENT GRAFT N/A 12/06/2013   Procedure: ABDOMINAL AORTIC ENDOVASCULAR STENT GRAFT;  Surgeon: Rosetta Posner, MD;  Location: [place];  Service: Vascular;  Laterality: N/A;  . CARDIAC CATHETERIZATION  04/04/2008    LAD stent patent, D1 OK, OM1 90/80/80% (11mm vessel), RCA 30%; EF 35%.  Marland Kitchen CARDIAC DEFIBRILLATOR PLACEMENT  04/07/2008    STJ single chamber ICD implanted for secondary prevention  . CHOLECYSTECTOMY    .  CHOLECYSTECTOMY, LAPAROSCOPIC  08/13/2009   Calculous cholecystitis  . CORONARY ANGIOPLASTY WITH STENT PLACEMENT  1999   PTCA and stenting of his left anterior descending artery  . ICD REVISION N/A 02/13/2017   Procedure: Upgrade to Dual Chamber ICD;  Surgeon: Will Meredith Leeds, MD;  Location: [place] [facility];  Service: Cardiovascular;  Laterality: N/A;  . LEFT HEART CATHETERIZATION WITH CORONARY ANGIOGRAM N/A 02/28/2013   Procedure: LEFT HEART CATHETERIZATION WITH CORONARY ANGIOGRAM;  Surgeon: Sherren Mocha, MD;  [facility] CATH [facility];  Service: Cardiovascular;  Laterality: N/A;  . TONSILLECTOMY      Current Outpatient Medications  Medication Sig Dispense Refill  . aspirin 81 MG tablet Take 81 mg by mouth every evening.     . carvedilol (COREG) 3.125 MG tablet Take 1 tablet (3.125 mg total) by mouth 2 (two) times daily. 180 tablet 3  . cholecalciferol (VITAMIN D) 1000 units tablet Take 1,000 Units by mouth daily.    . fexofenadine (ALLEGRA) 180 MG tablet Take 180 mg by mouth daily.    . fish oil-omega-3 fatty acids 1000 MG capsule Take 1 g by mouth daily.     . fluticasone (FLONASE) 50 MCG/ACT nasal spray Place 1 spray into both nostrils daily as needed for allergies or rhinitis.    . Glucosamine Sulfate-MSM (GLUCOSAMINE-MSM DS PO) Take 1 tablet by mouth daily.    Marland Kitchen ibuprofen (ADVIL,MOTRIN) 200 MG tablet Take 400 mg by mouth daily as needed for moderate pain.     Marland Kitchen  lisinopril (PRINIVIL,ZESTRIL) 5 MG tablet Take 1 tablet (5 mg total) by mouth daily. 90 tablet 3  . Melatonin 5 MG CAPS Take 5 mg by mouth at bedtime as needed (sleep).    . Multiple Vitamin (MULTIVITAMIN WITH MINERALS) TABS Take 1 tablet by mouth daily.    . nitroGLYCERIN (NITRODUR - DOSED IN MG/24 HR) 0.2 mg/hr patch Place 1 patch (0.2 mg total) onto the skin daily. 90 patch 3  . nitroGLYCERIN (NITROSTAT) 0.4 MG SL tablet Place 1 tablet (0.4 mg total) under the tongue every 5 (five) minutes as needed for chest pain ((MAX 3  DOSES)). 25 tablet 1  . rosuvastatin (CRESTOR) 5 MG tablet TAKE 1 TABLET EVERY OTHER  DAY 45 tablet 1  . sotalol (BETAPACE) 80 MG tablet Take 1 tablet (80 mg total) by mouth 2 (two) times daily. 180 tablet 3  . WELCHOL 625 MG tablet Take 1 tablet (625 mg total) by mouth 2 (two) times daily with a meal. 180 tablet 3   No current facility-administered medications for this visit.     Allergies:   Epinephrine; Losartan; Metoprolol; Mydriacyl [tropicamide]; Phenylephrine hcl; Bisoprolol; Clopidogrel bisulfate; Iodine; and Statins   Social History: Social History   Socioeconomic History  . Marital status: Married    Spouse name: Not on file  . Number of children: Not on file  . Years of education: Not on file  . Highest education level: Not on file  Social Needs  . Financial resource strain: Not on file  . Food insecurity - worry: Not on file  . Food insecurity - inability: Not on file  . Transportation needs - medical: Not on file  . Transportation needs - non-medical: Not on file  Occupational History  . Occupation: Retired Pharmacist, hospital  Tobacco Use  . Smoking status: Former Smoker    Packs/day: 1.00    Years: 20.00    Pack years: 20.00    Types: Cigarettes    Last attempt to quit: 12/23/1979    Years since quitting: 38.1  . Smokeless tobacco: Never Used  Substance and Sexual Activity  . Alcohol use: No    Alcohol/week: 0.0 oz  . Drug use: No  . Sexual activity: Yes  Other Topics Concern  . Not on file  Social History Narrative  . Not on file    Family History: Family History  Problem Relation Age of Onset  . Hyperlipidemia Mother   . Heart disease Mother   . Heart disease Father        before age 14  . Hyperlipidemia Father   . Mitral valve prolapse Sister   . Stomach cancer Unknown   . Heart attack Neg Hx   . Hypertension Neg Hx     Review of Systems: All other systems reviewed and are otherwise negative except as noted above.   Physical Exam: VS:  BP 110/72    Pulse (!) 57   Ht 5\' 9"  (1.753 m)   Wt 176 lb (79.8 kg)   SpO2 98%   BMI 25.99 kg/m  , BMI Body mass index is 25.99 kg/m.  GEN- The patient is well appearing, alert and oriented x 3 today.   HEENT: normocephalic, atraumatic; sclera clear, conjunctiva pink; hearing intact; oropharynx clear; neck supple Lungs- Clear to ausculation bilaterally, normal work of breathing.  No wheezes, rales, rhonchi Heart- regular rate and rhythm  GI- soft, non-tender, non-distended, bowel sounds present Extremities- no clubbing, cyanosis, or edema  MS- no significant deformity or atrophy  Skin- warm and dry, no rash or lesion; ICD pocket well healed Psych- euthymic mood, full affect Neuro- strength and sensation are intact  ICD interrogation- reviewed in detail today,  See PACEART report  EKG:  EKG is not ordered today.  Recent Labs: 02/27/2017: Magnesium 2.3   Wt Readings from Last 3 Encounters:  02/15/18 176 lb (79.8 kg)  09/05/17 186 lb (84.4 kg)  08/23/17 185 lb (83.9 kg)     Other studies Reviewed: Additional studies/ records that were reviewed today include: Dr Caryl Comes and Dr Hassell Done office notes  Assessment and Plan:  1.  Chronic systolic dysfunction euvolemic today Stable on an appropriate medical regimen Normal ICD function See Pace Art report No changes today  2.  VT No recent recurrence Continue Sotalol  BMET, Mg today  3.  ICM/CAD No recent ischemic symptoms Continue medical therapy   Current medicines are reviewed at length with the patient today.   The patient does not have concerns regarding his medicines.  The following changes were made today:  none  Labs/ tests ordered today include: BMET, Mg Orders Placed This Encounter  Procedures  . Basic metabolic panel  . Magnesium     Disposition:   Follow up with Dr Caryl Comes in 1 year, Dr Irish Lack as scheduled, [place]   Signed, Chanetta Marshall, NP 02/15/2018 1:00 PM  [address] [place] [address] [phone] (office) [phone] (fax)

## 2018-02-15 ENCOUNTER — Encounter: Payer: Self-pay | Admitting: Nurse Practitioner

## 2018-02-15 ENCOUNTER — Ambulatory Visit: Payer: Medicare HMO | Admitting: Nurse Practitioner

## 2018-02-15 VITALS — BP 110/72 | HR 57 | Ht 69.0 in | Wt 176.0 lb

## 2018-02-15 DIAGNOSIS — I472 Ventricular tachycardia, unspecified: Secondary | ICD-10-CM

## 2018-02-15 DIAGNOSIS — I255 Ischemic cardiomyopathy: Secondary | ICD-10-CM

## 2018-02-15 DIAGNOSIS — I5022 Chronic systolic (congestive) heart failure: Secondary | ICD-10-CM

## 2018-02-15 DIAGNOSIS — I25119 Atherosclerotic heart disease of native coronary artery with unspecified angina pectoris: Secondary | ICD-10-CM | POA: Diagnosis not present

## 2018-02-15 LAB — CUP PACEART REMOTE DEVICE CHECK
Battery Remaining Percentage: 88 %
Battery Voltage: 3.05 V
Brady Statistic AP VP Percent: 2.8 %
Brady Statistic AS VP Percent: 3.6 %
Brady Statistic RA Percent Paced: 27 %
HighPow Impedance: 52 Ohm
HighPow Impedance: 52 Ohm
Implantable Lead Implant Date: 20180305
Implantable Lead Location: 753859
Implantable Lead Model: 7121
Implantable Pulse Generator Implant Date: 20180305
Lead Channel Impedance Value: 310 Ohm
Lead Channel Impedance Value: 430 Ohm
Lead Channel Pacing Threshold Amplitude: 1.875 V
Lead Channel Pacing Threshold Pulse Width: 0.5 ms
Lead Channel Pacing Threshold Pulse Width: 0.5 ms
Lead Channel Sensing Intrinsic Amplitude: 10.8 mV
Lead Channel Setting Pacing Amplitude: 1.5 V
Lead Channel Setting Pacing Amplitude: 2.125
Lead Channel Setting Pacing Pulse Width: 0.5 ms
MDC IDC LEAD IMPLANT DT: 20090427
MDC IDC LEAD LOCATION: 753860
MDC IDC MSMT BATTERY REMAINING LONGEVITY: 86 mo
MDC IDC MSMT LEADCHNL RA PACING THRESHOLD AMPLITUDE: 0.5 V
MDC IDC MSMT LEADCHNL RA SENSING INTR AMPL: 4.2 mV
MDC IDC PG SERIAL: 1247854
MDC IDC SESS DTM: 20190305070017
MDC IDC SET LEADCHNL RV SENSING SENSITIVITY: 0.5 mV
MDC IDC STAT BRADY AP VS PERCENT: 26 %
MDC IDC STAT BRADY AS VS PERCENT: 67 %
MDC IDC STAT BRADY RV PERCENT PACED: 6.4 %

## 2018-02-15 LAB — BASIC METABOLIC PANEL
BUN/Creatinine Ratio: 26 — ABNORMAL HIGH (ref 10–24)
BUN: 23 mg/dL (ref 8–27)
CALCIUM: 9.3 mg/dL (ref 8.6–10.2)
CO2: 26 mmol/L (ref 20–29)
Chloride: 104 mmol/L (ref 96–106)
Creatinine, Ser: 0.88 mg/dL (ref 0.76–1.27)
GFR calc Af Amer: 97 mL/min/{1.73_m2} (ref >59)
GFR, EST NON AFRICAN AMERICAN: 84 mL/min/{1.73_m2} (ref >59)
Glucose: 92 mg/dL (ref 65–99)
POTASSIUM: 4.6 mmol/L (ref 3.5–5.2)
Sodium: 142 mmol/L (ref 134–144)

## 2018-02-15 LAB — MAGNESIUM: MAGNESIUM: 2.3 mg/dL (ref 1.6–2.3)

## 2018-02-15 NOTE — Patient Instructions (Addendum)
Medication Instructions:   Your physician recommends that you continue on your current medications as directed. Please refer to the Current Medication list given to you today.   If you need a refill on your cardiac medications before your next appointment, please call your pharmacy.  Labwork: BMET AND MAG  TODAY    Testing/Procedures: NONE ORDERED  TODAY    Follow-Up:  Your physician wants you to follow-up in: [place] will receive a reminder letter in the mail two months in advance. If you don't receive a letter, please call our office to schedule the follow-up appointment.   Remote monitoring is used to monitor your Pacemaker of ICD from home. This monitoring reduces the number of office visits required to check your device to one time per year. It allows [place] to keep an eye on the functioning of your device to ensure it is working properly. You are scheduled for a device check from home on . 6-4-19You may send your transmission at any time that day. If you have a wireless device, the transmission will be sent automatically. After your physician reviews your transmission, you will receive a postcard with your next transmission date.      Any Other Special Instructions Will Be Listed Below (If Applicable).

## 2018-02-16 ENCOUNTER — Encounter: Payer: Self-pay | Admitting: *Deleted

## 2018-02-16 LAB — CUP PACEART INCLINIC DEVICE CHECK
Implantable Lead Implant Date: 20180305
Implantable Lead Model: 7121
Implantable Pulse Generator Implant Date: 20180305
MDC IDC LEAD IMPLANT DT: 20090427
MDC IDC LEAD LOCATION: 753859
MDC IDC LEAD LOCATION: 753860
MDC IDC SESS DTM: 20190308064338
Pulse Gen Serial Number: 1247854

## 2018-03-08 DIAGNOSIS — M6281 Muscle weakness (generalized): Secondary | ICD-10-CM | POA: Diagnosis not present

## 2018-03-08 DIAGNOSIS — R102 Pelvic and perineal pain: Secondary | ICD-10-CM | POA: Diagnosis not present

## 2018-03-08 DIAGNOSIS — R300 Dysuria: Secondary | ICD-10-CM | POA: Diagnosis not present

## 2018-03-08 DIAGNOSIS — M62838 Other muscle spasm: Secondary | ICD-10-CM | POA: Diagnosis not present

## 2018-03-15 DIAGNOSIS — H43813 Vitreous degeneration, bilateral: Secondary | ICD-10-CM | POA: Diagnosis not present

## 2018-03-15 DIAGNOSIS — H5213 Myopia, bilateral: Secondary | ICD-10-CM | POA: Diagnosis not present

## 2018-03-15 DIAGNOSIS — H04123 Dry eye syndrome of bilateral lacrimal glands: Secondary | ICD-10-CM | POA: Diagnosis not present

## 2018-03-15 DIAGNOSIS — H25813 Combined forms of age-related cataract, bilateral: Secondary | ICD-10-CM | POA: Diagnosis not present

## 2018-03-22 DIAGNOSIS — M6281 Muscle weakness (generalized): Secondary | ICD-10-CM | POA: Diagnosis not present

## 2018-03-22 DIAGNOSIS — M62838 Other muscle spasm: Secondary | ICD-10-CM | POA: Diagnosis not present

## 2018-03-22 DIAGNOSIS — R102 Pelvic and perineal pain: Secondary | ICD-10-CM | POA: Diagnosis not present

## 2018-03-22 DIAGNOSIS — R350 Frequency of micturition: Secondary | ICD-10-CM | POA: Diagnosis not present

## 2018-04-25 DIAGNOSIS — R69 Illness, unspecified: Secondary | ICD-10-CM | POA: Diagnosis not present

## 2018-04-30 NOTE — Progress Notes (Signed)
Cardiology Office Note   Date:  05/03/2018   ID:  Joel Lin, DOB 1941/11/04, MRN [id number]  PCP:  [name], MD    No chief complaint on file.  CAD  Wt Readings from Last 3 Encounters:  05/03/18 170 lb (77.1 kg)  02/15/18 176 lb (79.8 kg)  09/05/17 186 lb (84.4 kg)       History of Present Illness: Joel Lin is a 77 y.o. male  has a history of known ischemic heart disease. He has a history of a remote myocardial infarction at the age of 23. That was a large posterolateral myocardial infarction in 1981 in [place]. He moved to [place] in 1985. The patient had angioplasty and stent of his LAD in 1999- Dr. Acie Fredrickson. In 2009 he had an out of hospital cardiac arrest at home was successfully resuscitated and treated with hypothermia without significant neurologic sequelae. No CAD by cath at that time. He now has a defibrillator in place and is followed by Dr. Caryl Comes. He has a history of hypercholesterolemia. His last nuclear stress test was 10/24/11 and showed an ejection fraction of 45% and a large posterolateral scar but no ischemia. His last echocardiogram was in 2014 and showed an ejection fraction of 40%. On 12/06/13 the patient underwent a stent graft for his abdominal aortic aneurysm. The patient did well and was discharged without complications. The patient has his defibrillator monitored through Dr. Olin Pia office. In January 2013 he had 2 episodes of ventricular tachycardia which were asymptomatic. In March 2014 he was rehospitalized after having 3 consecutive shocks from his defibrillator. He underwent cardiac catheterization by Dr. Burt Knack on 02/28/13 who told him that his arteries have not shown any progression of atherosclerosis since his previous cardiac catheterization in 2009.   On 02/10/15 the patient presented to [facility] ED due to ICD shock. He stated he was playing golf and just finished 9 holes and was starting on the back 9 when he suddenly felt  dizzy. The next thing he knows his device shocked him. He called 911 and went back to the clubhouse where EMS arrived and gave him four aspirin.  Pt was transferred to [facility] for EP eval. He did well overnight without any ICD shocks. Troponin was negative. He was seen by Dr. Caryl Comes and found stable for discharge. He had been placed on po amiodarone but Dr. Caryl Comes held on initiating amiodarone given relative infrequency of V tach.  AAA repair in 12/14, stent graft with Dr. Donnetta Hutching.  He was hospitalizatedfollowing a ICD discharge was on 06/24/15. During that admission his atenolol was stopped and he was switched to sotalol initially 80 mg twice a day and subsequently increased to 120 mg twice a day. More recently the dose was decreased again to 80 mg twice a day. He has had no further discharges from his defibrillator. The patient reports that he was able to get his heart rate up to 114.  He had an AICD changeout in 3/18 with addition of an atrial lead.  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  Denies : Chest pain. Dizziness. Leg edema. Nitroglycerin use. Orthopnea. Palpitations. Paroxysmal nocturnal dyspnea. Shortness of breath. Syncope.   He is intentionally losing weight.  He does some activities, but not consistently walking.    Past Medical History:  Diagnosis Date  . AAA (abdominal aortic aneurysm) ([place])   . Cardiac arrest ([place]) 03/29/2008   a. resuscitated OOH VF arrest  b. s/p STJ ICD  .  Contrast media allergy   . Coronary artery disease    myoview 2011>>EF of 53% / large area of old inferolateral infarct but no reversible ischemia          . Hemorrhoid   . Hypercoagulable state ([place])    Questionable history of a borderline hypercoagulable state  . Hyperlipidemia   . Myocardial infarction ([place]) 1981  . Peripheral vascular disease ([place])   . PVCs (premature ventricular contractions)    occasionally  . Ventricular tachycardia ([place])    a. appropriate ICD therpay b.  on Sotalol    Past Surgical History:  Procedure Laterality Date  . ABDOMINAL AORTIC ENDOVASCULAR STENT GRAFT N/A 12/06/2013   Procedure: ABDOMINAL AORTIC ENDOVASCULAR STENT GRAFT;  Surgeon: Rosetta Posner, MD;  Location: [place];  Service: Vascular;  Laterality: N/A;  . CARDIAC CATHETERIZATION  04/04/2008    LAD stent patent, D1 OK, OM1 90/80/80% (33mm vessel), RCA 30%; EF 35%.  Marland Kitchen CARDIAC DEFIBRILLATOR PLACEMENT  04/07/2008    STJ single chamber ICD implanted for secondary prevention  . CHOLECYSTECTOMY    . CHOLECYSTECTOMY, LAPAROSCOPIC  08/13/2009   Calculous cholecystitis  . CORONARY ANGIOPLASTY WITH STENT PLACEMENT  1999   PTCA and stenting of his left anterior descending artery  . ICD REVISION N/A 02/13/2017   Procedure: Upgrade to Dual Chamber ICD;  Surgeon: Will Meredith Leeds, MD;  Location: [place] [facility];  Service: Cardiovascular;  Laterality: N/A;  . LEFT HEART CATHETERIZATION WITH CORONARY ANGIOGRAM N/A 02/28/2013   Procedure: LEFT HEART CATHETERIZATION WITH CORONARY ANGIOGRAM;  Surgeon: Sherren Mocha, MD;  Location: [facility] CATH LAB;  Service: Cardiovascular;  Laterality: N/A;  . TONSILLECTOMY       Current Outpatient Medications  Medication Sig Dispense Refill  . aspirin 81 MG tablet Take 81 mg by mouth every evening.     . carvedilol (COREG) 3.125 MG tablet Take 1 tablet (3.125 mg total) by mouth 2 (two) times daily. 180 tablet 3  . cholecalciferol (VITAMIN D) 1000 units tablet Take 1,000 Units by mouth daily.    . fexofenadine (ALLEGRA) 180 MG tablet Take 180 mg by mouth daily.    . fish oil-omega-3 fatty acids 1000 MG capsule Take 1 g by mouth daily.     . fluticasone (FLONASE) 50 MCG/ACT nasal spray Place 1 spray into both nostrils daily as needed for allergies or rhinitis.    . Glucosamine Sulfate-MSM (GLUCOSAMINE-MSM DS PO) Take 1 tablet by mouth daily.    Marland Kitchen ibuprofen (ADVIL,MOTRIN) 200 MG tablet Take 400 mg by mouth daily as needed for moderate pain.     Marland Kitchen lisinopril  (PRINIVIL,ZESTRIL) 5 MG tablet Take 1 tablet (5 mg total) by mouth daily. 90 tablet 3  . Melatonin 5 MG CAPS Take 5 mg by mouth at bedtime as needed (sleep).    . Multiple Vitamin (MULTIVITAMIN WITH MINERALS) TABS Take 1 tablet by mouth daily.    . nitroGLYCERIN (NITRODUR - DOSED IN MG/24 HR) 0.2 mg/hr patch Place 1 patch (0.2 mg total) onto the skin daily. 90 patch 3  . nitroGLYCERIN (NITROSTAT) 0.4 MG SL tablet Place 1 tablet (0.4 mg total) under the tongue every 5 (five) minutes as needed for chest pain ((MAX 3 DOSES)). 25 tablet 1  . rosuvastatin (CRESTOR) 5 MG tablet TAKE 1 TABLET EVERY OTHER  DAY 45 tablet 1  . sotalol (BETAPACE) 80 MG tablet Take 1 tablet (80 mg total) by mouth 2 (two) times daily. 180 tablet 3  . WELCHOL 625 MG  tablet Take 1 tablet (625 mg total) by mouth 2 (two) times daily with a meal. 180 tablet 3   No current facility-administered medications for this visit.     Allergies:   Epinephrine; Losartan; Metoprolol; Mydriacyl [tropicamide]; Phenylephrine hcl; Bisoprolol; Clopidogrel bisulfate; Iodine; and Statins    Social History:  The patient  reports that he quit smoking about 38 years ago. His smoking use included cigarettes. He has a 20.00 pack-year smoking history. He has never used smokeless tobacco. He reports that he does not drink alcohol or use drugs.   Family History:  The patient's family history includes Heart disease in his father and mother; Hyperlipidemia in his father and mother; Mitral valve prolapse in his sister; Stomach cancer in his unknown relative.    ROS:  Please see the history of present illness.   Otherwise, review of systems are positive for intentional weight loss.   All other systems are reviewed and negative.    PHYSICAL EXAM: VS:  BP 106/78   Pulse (!) 50   Ht 5\' 9"  (1.753 m)   Wt 170 lb (77.1 kg)   SpO2 97%   BMI 25.10 kg/m  , BMI Body mass index is 25.1 kg/m. GEN: Well nourished, well developed, in no acute distress  HEENT:  normal  Neck: no JVD, carotid bruits, or masses Cardiac: RRR; no murmurs, rubs, or gallops,no edema  Respiratory:  clear to auscultation bilaterally, normal work of breathing GI: soft, nontender, nondistended, + BS MS: no deformity or atrophy  Skin: warm and dry, no rash Neuro:  Strength and sensation are intact Psych: euthymic mood, full affect   EKG:   The ekg ordered today demonstrates atrial pacing, no ST changes   Recent Labs: 02/15/2018: BUN 23; Creatinine, Ser 0.88; Magnesium 2.3; Potassium 4.6; Sodium 142   Lipid Panel    Component Value Date/Time   CHOL 147 05/27/2016 0939   TRIG 166 (H) 05/27/2016 0939   HDL 39 (L) 05/27/2016 0939   CHOLHDL 3.8 05/27/2016 0939   VLDL 33 (H) 05/27/2016 0939   LDLCALC 75 05/27/2016 0939   LDLDIRECT 80.0 08/12/2015 0806     Other studies Reviewed: Additional studies/ records that were reviewed today with results demonstrating: Stable potassium, magnesium and kidney function in March 2019.   ASSESSMENT AND PLAN:  1. Chronic systolic heart failure: He appears euvolemic.  He intentionally losing weight.  Continue current medicines. 2. CAD: No angina. 3. AAA: Stent graft in 2014 with Dr. Donnetta Hutching. 4. Hyperlipidemia: We will recheck lipids tomorrow when he is fasting. 5. AICD: Follow-up with EP.   Current medicines are reviewed at length with the patient today.  The patient concerns regarding his medicines were addressed.  The following changes have been made:  No change  Labs/ tests ordered today include:  No orders of the defined types were placed in this encounter.   Recommend 150 minutes/week of aerobic exercise Low fat, low carb, high fiber diet recommended  Disposition:   FU in 6 months   Signed, Larae Grooms, MD  05/03/2018 10:38 AM    [facility] [place], [place], [place]  [address] Phone: [phone]; Fax: [phone]

## 2018-05-03 ENCOUNTER — Ambulatory Visit: Payer: Medicare HMO | Admitting: Interventional Cardiology

## 2018-05-03 ENCOUNTER — Encounter: Payer: Self-pay | Admitting: Interventional Cardiology

## 2018-05-03 VITALS — BP 106/78 | HR 50 | Ht 69.0 in | Wt 170.0 lb

## 2018-05-03 DIAGNOSIS — I714 Abdominal aortic aneurysm, without rupture, unspecified: Secondary | ICD-10-CM

## 2018-05-03 DIAGNOSIS — I5022 Chronic systolic (congestive) heart failure: Secondary | ICD-10-CM

## 2018-05-03 DIAGNOSIS — I25119 Atherosclerotic heart disease of native coronary artery with unspecified angina pectoris: Secondary | ICD-10-CM

## 2018-05-03 DIAGNOSIS — Z95810 Presence of automatic (implantable) cardiac defibrillator: Secondary | ICD-10-CM | POA: Diagnosis not present

## 2018-05-03 NOTE — Patient Instructions (Signed)
Medication Instructions:  Your physician recommends that you continue on your current medications as directed. Please refer to the Current Medication list given to you today.   Labwork: Your physician recommends that you return for a FASTING lipid profile and liver function panel tomorrow 5/24   Testing/Procedures: None ordered  Follow-Up: Your physician wants you to follow-up in: 6 months with Dr. Irish Lack. You will receive a reminder letter in the mail two months in advance. If you don't receive a letter, please call our office to schedule the follow-up appointment.   Any Other Special Instructions Will Be Listed Below (If Applicable).     If you need a refill on your cardiac medications before your next appointment, please call your pharmacy.

## 2018-05-04 ENCOUNTER — Other Ambulatory Visit: Payer: Medicare HMO | Admitting: *Deleted

## 2018-05-04 DIAGNOSIS — I25119 Atherosclerotic heart disease of native coronary artery with unspecified angina pectoris: Secondary | ICD-10-CM

## 2018-05-04 LAB — HEPATIC FUNCTION PANEL
ALT: 19 IU/L (ref 0–44)
AST: 26 IU/L (ref 0–40)
Albumin: 4 g/dL (ref 3.5–4.8)
Alkaline Phosphatase: 69 IU/L (ref 39–117)
BILIRUBIN TOTAL: 0.3 mg/dL (ref 0.0–1.2)
BILIRUBIN, DIRECT: 0.1 mg/dL (ref 0.00–0.40)
TOTAL PROTEIN: 6.2 g/dL (ref 6.0–8.5)

## 2018-05-04 LAB — LIPID PANEL
CHOLESTEROL TOTAL: 127 mg/dL (ref 100–199)
Chol/HDL Ratio: 3.8 ratio (ref 0.0–5.0)
HDL: 33 mg/dL — AB (ref >39)
LDL CALC: 80 mg/dL (ref 0–99)
Triglycerides: 71 mg/dL (ref 0–149)
VLDL CHOLESTEROL CAL: 14 mg/dL (ref 5–40)

## 2018-05-15 ENCOUNTER — Ambulatory Visit (INDEPENDENT_AMBULATORY_CARE_PROVIDER_SITE_OTHER): Payer: Medicare HMO | Admitting: *Deleted

## 2018-05-15 DIAGNOSIS — I469 Cardiac arrest, cause unspecified: Secondary | ICD-10-CM

## 2018-05-15 NOTE — Progress Notes (Signed)
Remote ICD transmission.   

## 2018-06-05 LAB — CUP PACEART REMOTE DEVICE CHECK
Battery Remaining Longevity: 83 mo
Battery Remaining Percentage: 86 %
Battery Voltage: 3.02 V
Brady Statistic AS VP Percent: 1.4 %
Brady Statistic AS VS Percent: 54 %
Brady Statistic RA Percent Paced: 42 %
Date Time Interrogation Session: 20190604060015
HIGH POWER IMPEDANCE MEASURED VALUE: 47 Ohm
HighPow Impedance: 47 Ohm
Implantable Lead Implant Date: 20180305
Implantable Lead Location: 753860
Implantable Lead Model: 7121
Implantable Pulse Generator Implant Date: 20180305
Lead Channel Impedance Value: 300 Ohm
Lead Channel Impedance Value: 400 Ohm
Lead Channel Pacing Threshold Amplitude: 1.625 V
Lead Channel Pacing Threshold Pulse Width: 0.5 ms
Lead Channel Sensing Intrinsic Amplitude: 9.4 mV
Lead Channel Setting Pacing Amplitude: 1.5 V
Lead Channel Setting Pacing Amplitude: 1.875
Lead Channel Setting Pacing Pulse Width: 0.5 ms
MDC IDC LEAD IMPLANT DT: 20090427
MDC IDC LEAD LOCATION: 753859
MDC IDC MSMT LEADCHNL RA PACING THRESHOLD AMPLITUDE: 0.5 V
MDC IDC MSMT LEADCHNL RA SENSING INTR AMPL: 3.9 mV
MDC IDC MSMT LEADCHNL RV PACING THRESHOLD PULSEWIDTH: 0.5 ms
MDC IDC SET LEADCHNL RV SENSING SENSITIVITY: 0.5 mV
MDC IDC STAT BRADY AP VP PERCENT: 2.3 %
MDC IDC STAT BRADY AP VS PERCENT: 42 %
MDC IDC STAT BRADY RV PERCENT PACED: 3.7 %
Pulse Gen Serial Number: 1247854

## 2018-06-19 IMAGING — DX DG CHEST 2V
2 series · 2 of 2 positions shown · non-contrast
Comparison: Two-view chest x-ray 06/24/2015

CLINICAL DATA: New left-sided pacemaker.

EXAM:
CHEST  2 VIEW

[chest pa]
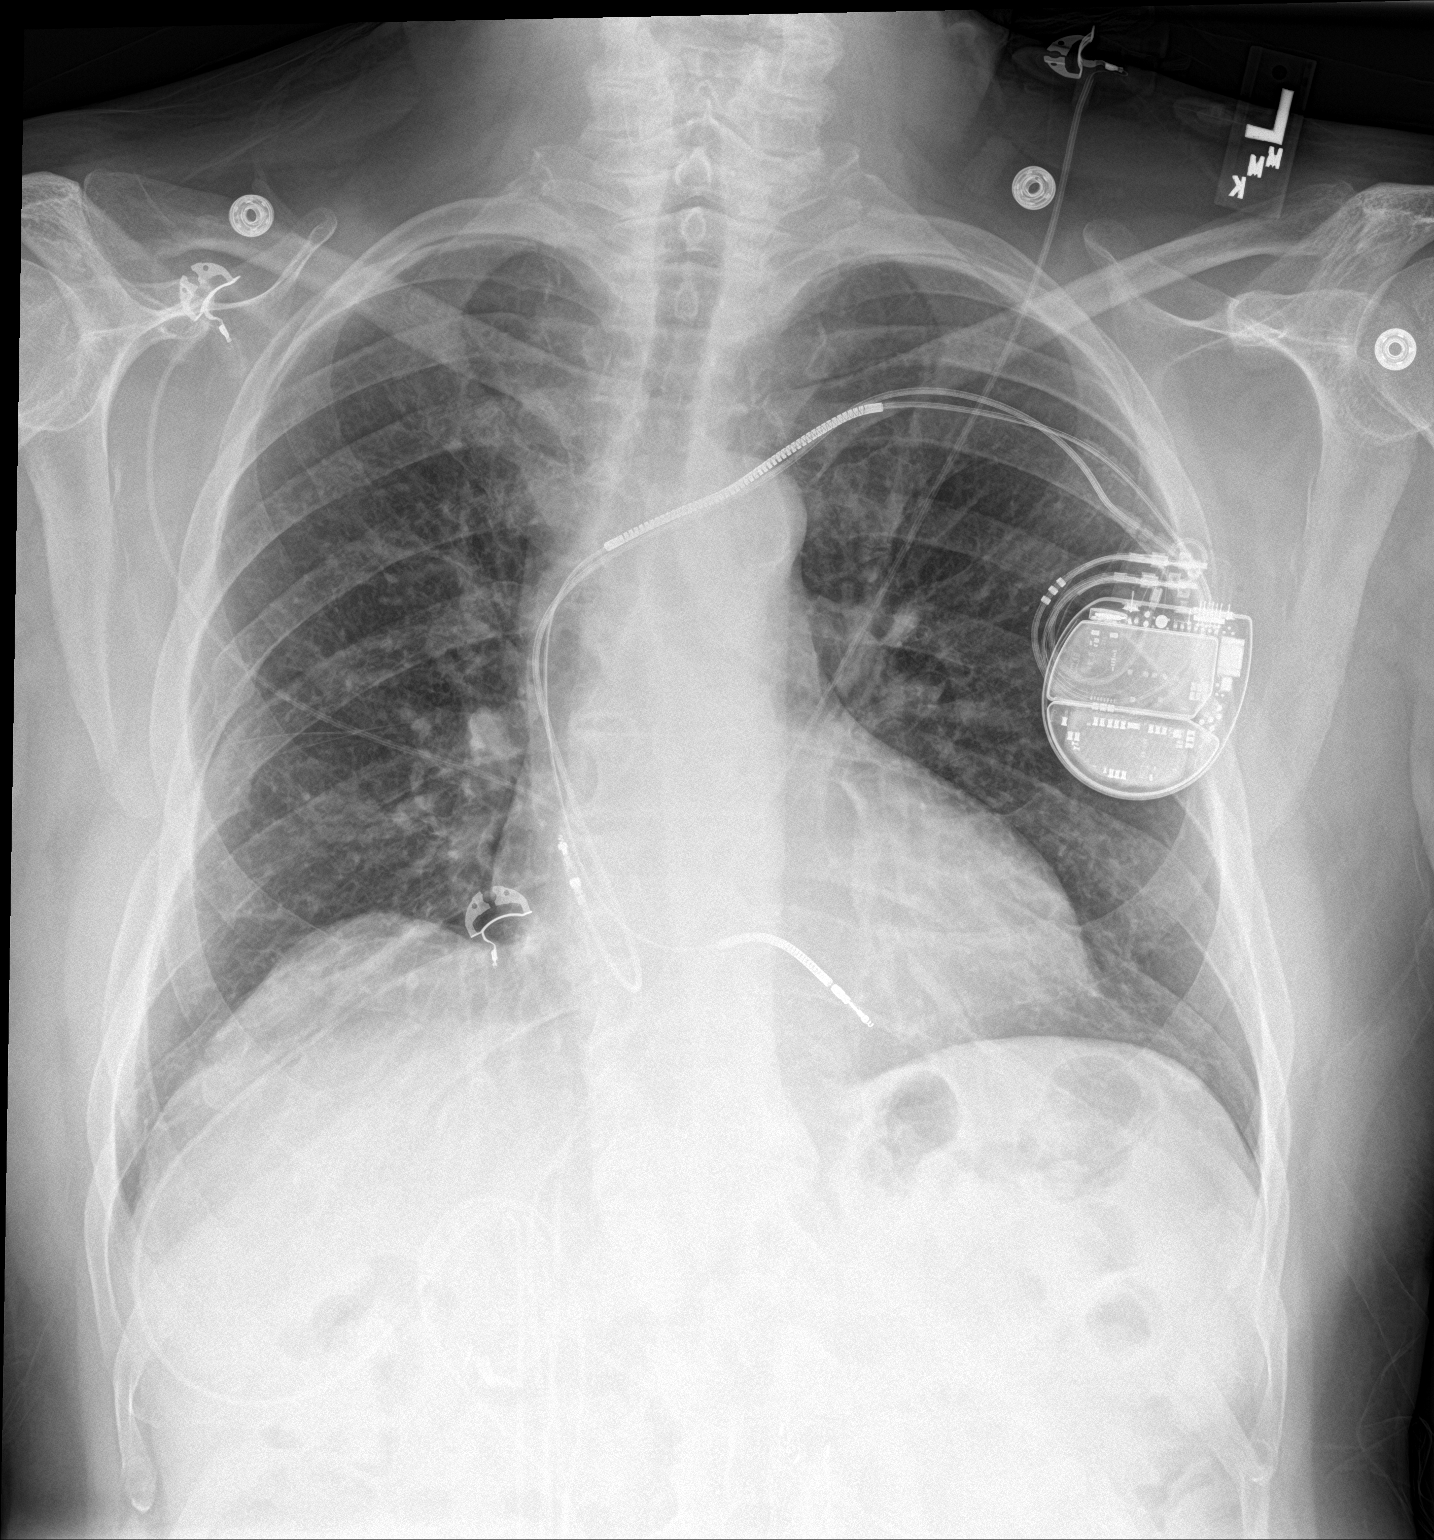

[chest lat]
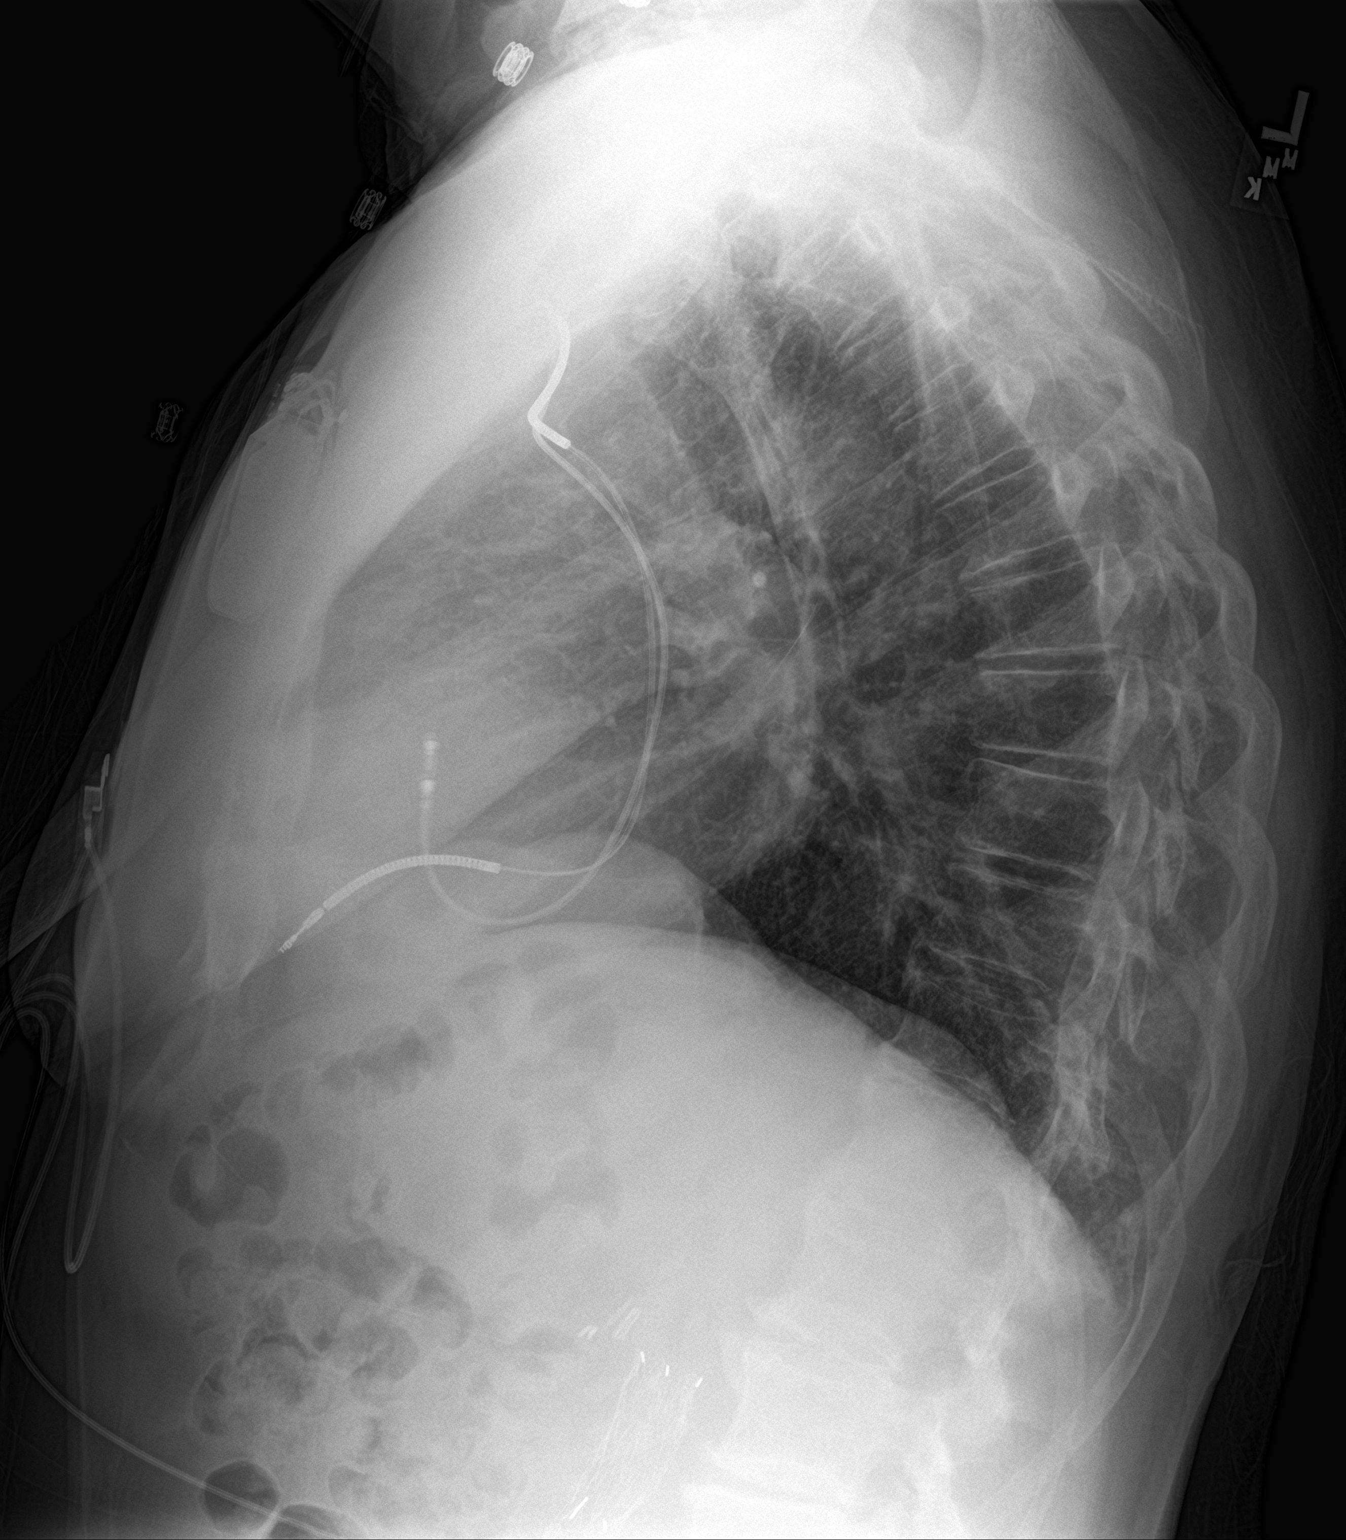

[2 of 2 positions shown; findings below may reference images not displayed]

FINDINGS: The heart size is normal. Pacemaker has been replaced. And atrial
lead has been added. There is no pneumothorax. Lung volumes are
somewhat low. There is no edema or effusion. Remote right-sided rib
fractures are seen. An aortic stent is in place. Cholecystectomy is
noted. The visualized soft tissues and bony thorax are otherwise
unremarkable.
IMPRESSION: 1. No acute cardiopulmonary disease.
2. Revision of left subclavian pacemaker with addition of an atrial
lead. No complications.
3. Low lung volumes.

## 2018-06-25 DIAGNOSIS — R972 Elevated prostate specific antigen [PSA]: Secondary | ICD-10-CM | POA: Diagnosis not present

## 2018-07-02 DIAGNOSIS — R351 Nocturia: Secondary | ICD-10-CM | POA: Diagnosis not present

## 2018-07-02 DIAGNOSIS — N401 Enlarged prostate with lower urinary tract symptoms: Secondary | ICD-10-CM | POA: Diagnosis not present

## 2018-07-02 DIAGNOSIS — R972 Elevated prostate specific antigen [PSA]: Secondary | ICD-10-CM | POA: Diagnosis not present

## 2018-08-07 ENCOUNTER — Other Ambulatory Visit: Payer: Self-pay | Admitting: Interventional Cardiology

## 2018-08-08 ENCOUNTER — Other Ambulatory Visit: Payer: Self-pay | Admitting: *Deleted

## 2018-08-08 MED ORDER — NITROGLYCERIN 0.2 MG/HR TD PT24: 0.2000 mg | MEDICATED_PATCH | Freq: Every day | TRANSDERMAL | 2 refills | Status: DC

## 2018-08-14 ENCOUNTER — Ambulatory Visit (INDEPENDENT_AMBULATORY_CARE_PROVIDER_SITE_OTHER): Payer: Medicare HMO | Admitting: *Deleted

## 2018-08-14 DIAGNOSIS — I469 Cardiac arrest, cause unspecified: Secondary | ICD-10-CM | POA: Diagnosis not present

## 2018-08-14 NOTE — Progress Notes (Signed)
Remote ICD transmission.   

## 2018-08-21 ENCOUNTER — Telehealth: Payer: Self-pay

## 2018-08-21 ENCOUNTER — Encounter: Payer: Medicare HMO | Admitting: Internal Medicine

## 2018-08-21 DIAGNOSIS — I472 Ventricular tachycardia, unspecified: Secondary | ICD-10-CM

## 2018-08-21 DIAGNOSIS — Z8674 Personal history of sudden cardiac arrest: Secondary | ICD-10-CM

## 2018-08-21 NOTE — Telephone Encounter (Signed)
Spoke with pt informed him of the rate setting on his device

## 2018-08-21 NOTE — Telephone Encounter (Signed)
Spoke with pt today. He was inappropriatly scheduled for his follow up with Dr Caryl Comes. Pt was slated to follow up in March of 2020. Appt has been rescheduled for 3/2. Appt was also made for a follow up with Irish Lack for his 6 month follow up per his recall letter. Pt will come by for BMP and Mg on 9/11 since he is due on his Sotolol.   Pt would also like for the device clinic to call him and explain his ICD settings. I advised pt I would message them to address his questions.

## 2018-08-22 ENCOUNTER — Other Ambulatory Visit: Payer: Medicare HMO | Admitting: *Deleted

## 2018-08-22 DIAGNOSIS — Z8674 Personal history of sudden cardiac arrest: Secondary | ICD-10-CM

## 2018-08-22 DIAGNOSIS — I2510 Atherosclerotic heart disease of native coronary artery without angina pectoris: Secondary | ICD-10-CM | POA: Diagnosis not present

## 2018-08-22 DIAGNOSIS — E7800 Pure hypercholesterolemia, unspecified: Secondary | ICD-10-CM | POA: Diagnosis not present

## 2018-08-22 DIAGNOSIS — I472 Ventricular tachycardia, unspecified: Secondary | ICD-10-CM

## 2018-08-22 DIAGNOSIS — I10 Essential (primary) hypertension: Secondary | ICD-10-CM | POA: Diagnosis not present

## 2018-08-22 DIAGNOSIS — I255 Ischemic cardiomyopathy: Secondary | ICD-10-CM | POA: Diagnosis not present

## 2018-08-22 LAB — BASIC METABOLIC PANEL
BUN/Creatinine Ratio: 28 — ABNORMAL HIGH (ref 10–24)
BUN: 24 mg/dL (ref 8–27)
CALCIUM: 9.5 mg/dL (ref 8.6–10.2)
CHLORIDE: 101 mmol/L (ref 96–106)
CO2: 25 mmol/L (ref 20–29)
Creatinine, Ser: 0.87 mg/dL (ref 0.76–1.27)
GFR calc Af Amer: 97 mL/min/{1.73_m2} (ref >59)
GFR calc non Af Amer: 84 mL/min/{1.73_m2} (ref >59)
GLUCOSE: 90 mg/dL (ref 65–99)
POTASSIUM: 4.2 mmol/L (ref 3.5–5.2)
Sodium: 140 mmol/L (ref 134–144)

## 2018-08-22 LAB — MAGNESIUM: Magnesium: 2.3 mg/dL (ref 1.6–2.3)

## 2018-09-06 ENCOUNTER — Telehealth: Payer: Self-pay | Admitting: Internal Medicine

## 2018-09-06 NOTE — Telephone Encounter (Signed)
° °  Patient calling to report  "vibrations" in chest,  Started 3 weeks.  No other symptoms    1. Has your device fired? NO   2. Is you device beeping? NO  3. Are you experiencing draining or swelling at device site? NO  4. Are you calling to see if we received your device transmission? NO  5. Have you passed out? NO    Please route to [place]

## 2018-09-06 NOTE — Telephone Encounter (Signed)
Spoke with patient, requested manual transmission for review. Assisted him with sending transmission. Advised patient I will call back after transmission is received.  Called patient back after reviewing transmission with Karilyn Cota, [employer] rep. No vibratory alert delivered. Presenting rhythm As/Vs @ 62bpm. No arrhythmia episodes since last remote transmission on 08/14/18. Ap 43%, Vp 3.6%. Histograms appropriate. PVC burden <1%. Lead trends stable, RA and RV autocapture on. Remaining longevity 6.5-7 years. Patient made aware of findings.   Patient reports he recently started feeling a vibration in his center chest for a few seconds early in the morning before he gets up out of bed. He denies any pain, ShOB, or other associated symptoms. He is taking carvedilol and sotalol as prescribed, but decreased his lisinopril to 2.5mg  daily at lunchtime due to what he feels are low BPs and HRs. BP each AM (prior to meds) runs 100s-110s/60s, HRs 50s-60s. He has not had symptoms.. Advised that per our notes, these HRs and BPs are stable for him. He has intentionally lost 25lbs since the beginning of the year.  Joel Lin recommended that only other thought would be to turn off RV autocapture, though it has been enabled since implant and is set to run every 8hrs.  Patient aware that this is an option. He does not feel the sensation any other time except early in the morning, so he plans to continue monitoring for now. He agrees to call back if he wishes to have RV autocapture turned off, or if he feels vibratory alert from ICD. Patient verbalizes appreciation of call and information. He denies additional questions or concerns at this time.

## 2018-09-10 LAB — CUP PACEART REMOTE DEVICE CHECK
Battery Remaining Longevity: 82 mo
Battery Remaining Percentage: 84 %
Battery Voltage: 2.99 V
Brady Statistic AP VP Percent: 2.3 %
Brady Statistic AS VP Percent: 1.2 %
Brady Statistic RA Percent Paced: 43 %
HIGH POWER IMPEDANCE MEASURED VALUE: 49 Ohm
HighPow Impedance: 49 Ohm
Implantable Lead Implant Date: 20090427
Implantable Lead Location: 753859
Implantable Lead Model: 7121
Implantable Pulse Generator Implant Date: 20180305
Lead Channel Impedance Value: 310 Ohm
Lead Channel Impedance Value: 400 Ohm
Lead Channel Pacing Threshold Amplitude: 1.625 V
Lead Channel Pacing Threshold Pulse Width: 0.5 ms
Lead Channel Sensing Intrinsic Amplitude: 10.2 mV
Lead Channel Setting Pacing Amplitude: 1.5 V
Lead Channel Setting Pacing Pulse Width: 0.5 ms
MDC IDC LEAD IMPLANT DT: 20180305
MDC IDC LEAD LOCATION: 753860
MDC IDC MSMT LEADCHNL RA PACING THRESHOLD AMPLITUDE: 0.5 V
MDC IDC MSMT LEADCHNL RA SENSING INTR AMPL: 3.9 mV
MDC IDC MSMT LEADCHNL RV PACING THRESHOLD PULSEWIDTH: 0.5 ms
MDC IDC SESS DTM: 20190903060016
MDC IDC SET LEADCHNL RV PACING AMPLITUDE: 1.875
MDC IDC SET LEADCHNL RV SENSING SENSITIVITY: 0.5 mV
MDC IDC STAT BRADY AP VS PERCENT: 43 %
MDC IDC STAT BRADY AS VS PERCENT: 53 %
MDC IDC STAT BRADY RV PERCENT PACED: 3.5 %
Pulse Gen Serial Number: 1247854

## 2018-09-10 MED ORDER — LISINOPRIL 2.5 MG PO TABS: 2.5000 mg | ORAL_TABLET | Freq: Every day | ORAL | 3 refills | Status: DC

## 2018-09-10 NOTE — Telephone Encounter (Signed)
Called and made patient aware that Dr. Irish Lack was okay with him taking lisinopril 2.5 mg QD. Instructed patient to continue to monitor BP. New Rx sent to preferred pharmacy.

## 2018-09-10 NOTE — Telephone Encounter (Signed)
OK.  Agree with decrease in lisinopril. Continue to monitor BP.

## 2018-09-10 NOTE — Addendum Note (Signed)
Addended by: Drue Novel I on: 09/10/2018 05:52 PM   Modules accepted: Orders

## 2018-10-31 ENCOUNTER — Telehealth: Payer: Self-pay | Admitting: Interventional Cardiology

## 2018-10-31 NOTE — Telephone Encounter (Signed)
Reference#[id number]      Nitroglycerin A 1 rated generic have been discontinued-Wants to know if they can use Nitroglycerin T 0.2mg ? Need to know this before 11-02-18, if not prescription will be cancelled.

## 2018-10-31 NOTE — Telephone Encounter (Signed)
Called and made Pharmacist at [facility] aware that it is okay to fill generic Nitro patch 0.2 mg.

## 2018-11-06 DIAGNOSIS — M545 Low back pain: Secondary | ICD-10-CM | POA: Diagnosis not present

## 2018-11-13 ENCOUNTER — Ambulatory Visit (INDEPENDENT_AMBULATORY_CARE_PROVIDER_SITE_OTHER): Payer: Medicare HMO

## 2018-11-13 DIAGNOSIS — I472 Ventricular tachycardia, unspecified: Secondary | ICD-10-CM

## 2018-11-13 NOTE — Progress Notes (Signed)
Remote ICD transmission.   

## 2018-11-19 ENCOUNTER — Encounter: Payer: Self-pay | Admitting: Interventional Cardiology

## 2018-11-19 ENCOUNTER — Ambulatory Visit: Payer: Medicare HMO | Admitting: Interventional Cardiology

## 2018-11-19 VITALS — BP 126/70 | HR 51 | Ht 69.0 in | Wt 161.2 lb

## 2018-11-19 DIAGNOSIS — I714 Abdominal aortic aneurysm, without rupture, unspecified: Secondary | ICD-10-CM

## 2018-11-19 DIAGNOSIS — E782 Mixed hyperlipidemia: Secondary | ICD-10-CM

## 2018-11-19 DIAGNOSIS — I25119 Atherosclerotic heart disease of native coronary artery with unspecified angina pectoris: Secondary | ICD-10-CM | POA: Diagnosis not present

## 2018-11-19 DIAGNOSIS — I5022 Chronic systolic (congestive) heart failure: Secondary | ICD-10-CM | POA: Diagnosis not present

## 2018-11-19 DIAGNOSIS — Z95810 Presence of automatic (implantable) cardiac defibrillator: Secondary | ICD-10-CM

## 2018-11-19 DIAGNOSIS — I4901 Ventricular fibrillation: Secondary | ICD-10-CM | POA: Diagnosis not present

## 2018-11-19 MED ORDER — NITROGLYCERIN 0.2 MG/HR TD PT24: 0.2000 mg | MEDICATED_PATCH | TRANSDERMAL | 2 refills | Status: DC

## 2018-11-19 MED ORDER — NITROGLYCERIN 0.4 MG SL SUBL: 0.4000 mg | SUBLINGUAL_TABLET | SUBLINGUAL | 3 refills | Status: DC | PRN

## 2018-11-19 NOTE — Progress Notes (Signed)
Cardiology Office Note   Date:  11/19/2018   ID:  ALEXIUS HANGARTNER, DOB [birth year], MRN [id number]  PCP:  [name], MD    No chief complaint on file.  Chronic systolic heart failure  Wt Readings from Last 3 Encounters:  11/19/18 161 lb 3.2 oz (73.1 kg)  05/03/18 170 lb (77.1 kg)  02/15/18 176 lb (79.8 kg)       History of Present Illness: Joel Lin is a 77 y.o. male   has a history of known ischemic heart disease. He has a history of a remote myocardial infarction at the age of 55. That was a large posterolateral myocardial infarction in 1981 in [place]. He moved to [place] in 1985. The patient had angioplasty and stent of his LAD in 1999- Dr. Acie Fredrickson. In 2009 he had an out of hospital cardiac arrest at home was successfully resuscitated and treated with hypothermia without significant neurologic sequelae. No CAD by cath at that time. He now has a defibrillator in place and is followed by Dr. Caryl Comes. He has a history of hypercholesterolemia. His last nuclear stress test was 10/24/11 and showed an ejection fraction of 45% and a large posterolateral scar but no ischemia. His last echocardiogram was in 2014 and showed an ejection fraction of 40%. On 12/06/13 the patient underwent a stent graft for his abdominal aortic aneurysm. The patient did well and was discharged without complications. The patient has his defibrillator monitored through Dr. Olin Pia office. In January 2013 he had 2 episodes of ventricular tachycardia which were asymptomatic. In March 2014 he was rehospitalized after having 3 consecutive shocks from his defibrillator. He underwent cardiac catheterization by Dr. Burt Knack on 02/28/13 who told him that his arteries have not shown any progression of atherosclerosis since his previous cardiac catheterization in 2009.   On 02/10/15 the patient presented to [facility] ED due to ICD shock. He stated he was playing golf and just finished 9 holes and was starting on the  back 9 when he suddenly felt dizzy. The next thing he knows his device shocked him. He called 911 and went back to the clubhouse where EMS arrived and gave him four aspirin.  Pt was transferred to [facility] for EP eval. He did well overnight without any ICD shocks. Troponin was negative. He was seen by Dr. Caryl Comes and found stable for discharge. He had been placed on po amiodarone but Dr. Caryl Comes held on initiating amiodarone given relative infrequency of V tach.  AAA repair in 12/14, stent graft with Dr. Donnetta Hutching.  He was hospitalizatedfollowing a ICD discharge was on 06/24/15. During that admission his atenolol was stopped and he was switched to sotalol initially 80 mg twice a day and subsequently increased to 120 mg twice a day. More recently the dose was decreased again to 80 mg twice a day. He has had no further discharges from his defibrillator. The patient reports that he was able to get his heart rate up to 114.  He had an AICD changeout in 3/18 with addition of an atrial lead.  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  Since the last visit, he has done well.    Denies : Chest pain. Dizziness. Leg edema. Nitroglycerin use. Orthopnea. Palpitations. Paroxysmal nocturnal dyspnea. Shortness of breath. Syncope.   Past Medical History:  Diagnosis Date  . AAA (abdominal aortic aneurysm) ([place])   . Cardiac arrest ([place]) 03/29/2008   a. resuscitated OOH VF arrest  b. s/p STJ ICD  .  Contrast media allergy   . Coronary artery disease    myoview 2011>>EF of 53% / large area of old inferolateral infarct but no reversible ischemia          . Hemorrhoid   . Hypercoagulable state ([place])    Questionable history of a borderline hypercoagulable state  . Hyperlipidemia   . Myocardial infarction ([place]) 1981  . Peripheral vascular disease ([place])   . PVCs (premature ventricular contractions)    occasionally  . Ventricular tachycardia ([place])    a. appropriate ICD therpay b. on Sotalol    Past  Surgical History:  Procedure Laterality Date  . ABDOMINAL AORTIC ENDOVASCULAR STENT GRAFT N/A 12/06/2013   Procedure: ABDOMINAL AORTIC ENDOVASCULAR STENT GRAFT;  Surgeon: Rosetta Posner, MD;  Location: [place];  Service: Vascular;  Laterality: N/A;  . CARDIAC CATHETERIZATION  04/04/2008    LAD stent patent, D1 OK, OM1 90/80/80% (74mm vessel), RCA 30%; EF 35%.  Marland Kitchen CARDIAC DEFIBRILLATOR PLACEMENT  04/07/2008    STJ single chamber ICD implanted for secondary prevention  . CHOLECYSTECTOMY    . CHOLECYSTECTOMY, LAPAROSCOPIC  08/13/2009   Calculous cholecystitis  . CORONARY ANGIOPLASTY WITH STENT PLACEMENT  1999   PTCA and stenting of his left anterior descending artery  . ICD REVISION N/A 02/13/2017   Procedure: Upgrade to Dual Chamber ICD;  Surgeon: Will Meredith Leeds, MD;  Location: [place] CV [facility];  Service: Cardiovascular;  Laterality: N/A;  . LEFT HEART CATHETERIZATION WITH CORONARY ANGIOGRAM N/A 02/28/2013   Procedure: LEFT HEART CATHETERIZATION WITH CORONARY ANGIOGRAM;  Surgeon: Sherren Mocha, MD;  Location: [facility] CATH LAB;  Service: Cardiovascular;  Laterality: N/A;  . TONSILLECTOMY       Current Outpatient Medications  Medication Sig Dispense Refill  . aspirin 81 MG tablet Take 81 mg by mouth every evening.     . carvedilol (COREG) 3.125 MG tablet TAKE 1 TABLET TWICE A DAY 180 tablet 2  . cholecalciferol (VITAMIN D) 1000 units tablet Take 1,000 Units by mouth daily.    . fexofenadine (ALLEGRA) 180 MG tablet Take 180 mg by mouth daily.    . fish oil-omega-3 fatty acids 1000 MG capsule Take 1 g by mouth daily.     . fluticasone (FLONASE) 50 MCG/ACT nasal spray Place 1 spray into both nostrils daily as needed for allergies or rhinitis.    . Glucosamine Sulfate-MSM (GLUCOSAMINE-MSM DS PO) Take 1 tablet by mouth daily.    Marland Kitchen ibuprofen (ADVIL,MOTRIN) 200 MG tablet Take 400 mg by mouth daily as needed for moderate pain.     Marland Kitchen lisinopril (PRINIVIL,ZESTRIL) 2.5 MG tablet Take 1 tablet (2.5 mg  total) by mouth daily. 90 tablet 3  . Melatonin 5 MG CAPS Take 5 mg by mouth at bedtime as needed (sleep).    . Multiple Vitamin (MULTIVITAMIN WITH MINERALS) TABS Take 1 tablet by mouth daily.    . nitroGLYCERIN (NITRODUR - DOSED IN MG/24 HR) 0.2 mg/hr patch Place 1 patch (0.2 mg total) onto the skin daily. 90 patch 2  . nitroGLYCERIN (NITROSTAT) 0.4 MG SL tablet Place 1 tablet (0.4 mg total) under the tongue every 5 (five) minutes as needed for chest pain ((MAX 3 DOSES)). 25 tablet 1  . rosuvastatin (CRESTOR) 5 MG tablet TAKE 1 TABLET EVERY OTHER  DAY 45 tablet 2  . sotalol (BETAPACE) 80 MG tablet TAKE 1 TABLET TWICE A DAY 180 tablet 2  . WELCHOL 625 MG tablet TAKE 1 TABLET TWICE DAILY  WITH MEALS 180 tablet 2  No current facility-administered medications for this visit.     Allergies:   Epinephrine; Losartan; Metoprolol; Mydriacyl [tropicamide]; Phenylephrine hcl; Bisoprolol; Clopidogrel bisulfate; Iodine; and Statins    Social History:  The patient  reports that he quit smoking about 38 years ago. His smoking use included cigarettes. He has a 20.00 pack-year smoking history. He has never used smokeless tobacco. He reports that he does not drink alcohol or use drugs.   Family History:  The patient's family history includes Heart disease in his father and mother; Hyperlipidemia in his father and mother; Mitral valve prolapse in his sister; Stomach cancer in his unknown relative.    ROS:  Please see the history of present illness.   Otherwise, review of systems are positive for intentional weight loss.   All other systems are reviewed and negative.    PHYSICAL EXAM: VS:  BP 126/70   Pulse (!) 51   Ht 5\' 9"  (1.753 m)   Wt 161 lb 3.2 oz (73.1 kg)   SpO2 97%   BMI 23.81 kg/m  , BMI Body mass index is 23.81 kg/m. GEN: Well nourished, well developed, in no acute distress  HEENT: normal  Neck: no JVD, carotid bruits, or masses Cardiac: RRR; no murmurs, rubs, or gallops,no edema    Respiratory:  clear to auscultation bilaterally, normal work of breathing GI: soft, nontender, nondistended, + BS MS: no deformity or atrophy  Skin: warm and dry, no rash Neuro:  Strength and sensation are intact Psych: euthymic mood, full affect   EKG:   The ekg ordered today demonstrates sinus bradycardia, no ST changes   Recent Labs: 05/04/2018: ALT 19 08/22/2018: BUN 24; Creatinine, Ser 0.87; Magnesium 2.3; Potassium 4.2; Sodium 140   Lipid Panel    Component Value Date/Time   CHOL 127 05/04/2018 0902   TRIG 71 05/04/2018 0902   HDL 33 (L) 05/04/2018 0902   CHOLHDL 3.8 05/04/2018 0902   CHOLHDL 3.8 05/27/2016 0939   VLDL 33 (H) 05/27/2016 0939   LDLCALC 80 05/04/2018 0902   LDLDIRECT 80.0 08/12/2015 0806     Other studies Reviewed: Additional studies/ records that were reviewed today with results demonstrating: .   ASSESSMENT AND PLAN:  1. Chronic systolic heart failure: He has decreased NTG patches to every other day.  COntinue medical therapy. 2. CAD: RX for SL NTG given.  3. AAA: Prior stent graft with Dr. early in 2014. 4. Hyperlipidemia: lipids well controlled.  5. AICD: Followed by EP.   Current medicines are reviewed at length with the patient today.  The patient concerns regarding his medicines were addressed.  The following changes have been made:  No change  Labs/ tests ordered today include:  No orders of the defined types were placed in this encounter.   Recommend 150 minutes/week of aerobic exercise Low fat, low carb, high fiber diet recommended  Disposition:   FU in 6 months   Signed, Larae Grooms, MD  11/19/2018 9:27 AM    [facility] [address], [place], [place]  [address] Phone: [phone]; Fax: [phone]

## 2018-11-19 NOTE — Patient Instructions (Signed)

## 2019-01-01 ENCOUNTER — Other Ambulatory Visit: Payer: Self-pay

## 2019-01-03 ENCOUNTER — Telehealth: Payer: Self-pay | Admitting: Interventional Cardiology

## 2019-01-03 LAB — CUP PACEART REMOTE DEVICE CHECK
Battery Remaining Percentage: 81 %
Battery Voltage: 2.98 V
Brady Statistic AP VP Percent: 2.4 %
Brady Statistic AP VS Percent: 44 %
Brady Statistic AS VP Percent: 1.2 %
Brady Statistic AS VS Percent: 52 %
Brady Statistic RV Percent Paced: 3.6 %
HighPow Impedance: 47 Ohm
HighPow Impedance: 47 Ohm
Implantable Lead Implant Date: 20090427
Implantable Lead Implant Date: 20180305
Implantable Lead Location: 753859
Implantable Lead Model: 7121
Implantable Pulse Generator Implant Date: 20180305
Lead Channel Impedance Value: 300 Ohm
Lead Channel Pacing Threshold Amplitude: 0.5 V
Lead Channel Pacing Threshold Pulse Width: 0.5 ms
Lead Channel Setting Pacing Amplitude: 1.875
Lead Channel Setting Pacing Pulse Width: 0.5 ms
MDC IDC LEAD LOCATION: 753860
MDC IDC MSMT BATTERY REMAINING LONGEVITY: 79 mo
MDC IDC MSMT LEADCHNL RA IMPEDANCE VALUE: 400 Ohm
MDC IDC MSMT LEADCHNL RA PACING THRESHOLD PULSEWIDTH: 0.5 ms
MDC IDC MSMT LEADCHNL RA SENSING INTR AMPL: 4.1 mV
MDC IDC MSMT LEADCHNL RV PACING THRESHOLD AMPLITUDE: 1.625 V
MDC IDC MSMT LEADCHNL RV SENSING INTR AMPL: 9.3 mV
MDC IDC PG SERIAL: 1247854
MDC IDC SESS DTM: 20191203070019
MDC IDC SET LEADCHNL RA PACING AMPLITUDE: 1.5 V
MDC IDC SET LEADCHNL RV SENSING SENSITIVITY: 0.5 mV
MDC IDC STAT BRADY RA PERCENT PACED: 44 %

## 2019-01-03 NOTE — Telephone Encounter (Signed)
° ° °  [facility] calling [phone] Ref # [id number]    Pt c/o medication issue:  1. Name of Medication: nitroGLYCERIN (NITRODUR - DOSED IN MG/24 HR) 0.2 mg/hr patch  2. How are you currently taking this medication (dosage and times per day)? n/a  3. Are you having a reaction (difficulty breathing--STAT)? no  4. What is your medication issue? [facility] is out of stock, has questions regarding alternative patch

## 2019-01-03 NOTE — Telephone Encounter (Signed)
Spoke with Allie at [facility] and she is wanting to know if it is okay for the patient to switch to generic Transderm Nitro 0.2 mg/hr patch as they are out of the the brand name. Made them aware that this is fine and Dr. Irish Lack is in agreement. She verbalized understanding and thanked me for the call.

## 2019-01-08 ENCOUNTER — Other Ambulatory Visit: Payer: Self-pay

## 2019-01-08 MED ORDER — NITROGLYCERIN 0.2 MG/HR TD PT24: 0.2000 mg | MEDICATED_PATCH | Freq: Every day | TRANSDERMAL | 2 refills | Status: DC

## 2019-01-21 MED ORDER — NITROGLYCERIN 0.2 MG/HR TD PT24: 0.2000 mg | MEDICATED_PATCH | Freq: Every day | TRANSDERMAL | 2 refills | Status: DC

## 2019-01-21 NOTE — Addendum Note (Signed)
Addended by: Derl Barrow on: 01/21/2019 12:31 PM   Modules accepted: Orders

## 2019-01-22 DIAGNOSIS — R69 Illness, unspecified: Secondary | ICD-10-CM | POA: Diagnosis not present

## 2019-01-23 ENCOUNTER — Other Ambulatory Visit: Payer: Self-pay | Admitting: Interventional Cardiology

## 2019-01-23 NOTE — Telephone Encounter (Signed)
Follow Up:    Nikki from [facility] called and said the manufacturer is out of Nitroglycerin and would like to use the [facility], not the [employer]2Rated Form. The reference# is [id number]

## 2019-01-23 NOTE — Telephone Encounter (Signed)
Called and spoke to Legrand Como at [facility] who states that the patient's nitroderm patch has been discontinued and is wanting to know if the patient can take generic form Nitroderm 0.2mg /hr patch. Made him aware that this was okay.

## 2019-02-01 ENCOUNTER — Other Ambulatory Visit: Payer: Self-pay

## 2019-02-01 DIAGNOSIS — Z95828 Presence of other vascular implants and grafts: Secondary | ICD-10-CM

## 2019-02-01 DIAGNOSIS — Z87891 Personal history of nicotine dependence: Secondary | ICD-10-CM

## 2019-02-01 DIAGNOSIS — I714 Abdominal aortic aneurysm, without rupture, unspecified: Secondary | ICD-10-CM

## 2019-02-05 DIAGNOSIS — L814 Other melanin hyperpigmentation: Secondary | ICD-10-CM | POA: Diagnosis not present

## 2019-02-05 DIAGNOSIS — L821 Other seborrheic keratosis: Secondary | ICD-10-CM | POA: Diagnosis not present

## 2019-02-05 DIAGNOSIS — D225 Melanocytic nevi of trunk: Secondary | ICD-10-CM | POA: Diagnosis not present

## 2019-02-05 DIAGNOSIS — Z23 Encounter for immunization: Secondary | ICD-10-CM | POA: Diagnosis not present

## 2019-02-07 ENCOUNTER — Ambulatory Visit: Payer: Medicare HMO | Admitting: Physician Assistant

## 2019-02-07 ENCOUNTER — Ambulatory Visit (HOSPITAL_COMMUNITY)
Admission: RE | Admit: 2019-02-07 | Discharge: 2019-02-07 | Disposition: A | Discharge status: Home / Self Care | Payer: Medicare HMO | Source: Ambulatory Visit | Attending: Vascular Surgery | Admitting: Vascular Surgery

## 2019-02-07 ENCOUNTER — Other Ambulatory Visit: Payer: Self-pay

## 2019-02-07 ENCOUNTER — Encounter: Payer: Self-pay | Admitting: Family

## 2019-02-07 VITALS — BP 126/77 | HR 50 | Temp 97.0°F | Resp 20 | Ht 69.0 in | Wt 161.0 lb

## 2019-02-07 DIAGNOSIS — Z95828 Presence of other vascular implants and grafts: Secondary | ICD-10-CM | POA: Diagnosis not present

## 2019-02-07 DIAGNOSIS — I714 Abdominal aortic aneurysm, without rupture, unspecified: Secondary | ICD-10-CM

## 2019-02-07 DIAGNOSIS — Z87891 Personal history of nicotine dependence: Secondary | ICD-10-CM

## 2019-02-07 NOTE — Progress Notes (Signed)
Established EVAR   History of Present Illness   Joel Lin is a 78 y.o. (02-Oct-1941) male who presents for routine follow up s/p EVAR by Dr. Donnetta Hutching (Date: 11/2013).  He denies any new or changing abdominal or back pain.  He also denies any claudication, rest pain, or tissue changes of bilateral lower extremities.  Past medical history also significant for MI requiring stenting as well as a cardiac arrest resulting in a ICD.  He follows regularly with his cardiologist.  He is taking aspirin and statin daily.  He is a former smoker who quit in 1981 after his first heart attack.  The patient's PMH, PSH, SH, and FamHx were reviewed and are unchanged from prior visit.  Current Outpatient Medications  Medication Sig Dispense Refill  . aspirin 81 MG tablet Take 81 mg by mouth every evening.     . carvedilol (COREG) 3.125 MG tablet TAKE 1 TABLET TWICE A DAY 180 tablet 2  . cholecalciferol (VITAMIN D) 1000 units tablet Take 1,000 Units by mouth daily.    . fexofenadine (ALLEGRA) 180 MG tablet Take 180 mg by mouth daily.    . fish oil-omega-3 fatty acids 1000 MG capsule Take 1 g by mouth daily.     . fluticasone (FLONASE) 50 MCG/ACT nasal spray Place 1 spray into both nostrils daily as needed for allergies or rhinitis.    . Glucosamine Sulfate-MSM (GLUCOSAMINE-MSM DS PO) Take 1 tablet by mouth daily.    Marland Kitchen ibuprofen (ADVIL,MOTRIN) 200 MG tablet Take 400 mg by mouth daily as needed for moderate pain.     . Melatonin 5 MG CAPS Take 5 mg by mouth at bedtime as needed (sleep).    . Multiple Vitamin (MULTIVITAMIN WITH MINERALS) TABS Take 1 tablet by mouth daily.    . nitroGLYCERIN (NITRODUR - DOSED IN MG/24 HR) 0.2 mg/hr patch Place 1 patch (0.2 mg total) onto the skin daily. 90 patch 2  . nitroGLYCERIN (NITROSTAT) 0.4 MG SL tablet Place 1 tablet (0.4 mg total) under the tongue every 5 (five) minutes as needed for chest pain ((MAX 3 DOSES)). 25 tablet 3  . rosuvastatin (CRESTOR) 5 MG tablet TAKE  1 TABLET EVERY OTHER  DAY 45 tablet 2  . sotalol (BETAPACE) 80 MG tablet TAKE 1 TABLET TWICE A DAY 180 tablet 2  . WELCHOL 625 MG tablet TAKE 1 TABLET TWICE DAILY  WITH MEALS 180 tablet 2  . lisinopril (PRINIVIL,ZESTRIL) 2.5 MG tablet Take 1 tablet (2.5 mg total) by mouth daily. 90 tablet 3   No current facility-administered medications for this visit.     On ROS today: 10 system ROS is negative unless otherwise noted in HPI   Physical Examination   Vitals:   02/07/19 0926  BP: 126/77  Pulse: (!) 50  Resp: 20  Temp: (!) 97 F (36.1 C)  SpO2: 99%  Weight: 161 lb (73 kg)  Height: 5\' 9"  (1.753 m)   Body mass index is 23.78 kg/m.  General Alert, O x 3, WD, NAD  Pulmonary Sym exp, good B air movt  Cardiac RRR, Nl S1, S2  Vascular Vessel Right Left  Radial Palpable Palpable  Carotid Palpable, No Bruit Palpable, No Bruit  Aorta Not palpable N/A    Gastro- intestinal soft, non-distended, non-tender to palpation, No guarding or rebound,   Musculo- skeletal M/S 5/5 throughout  , Extremities without ischemic changes  , No edema present, No visible varicosities ,   Neurologic Pain and light touch  intact in extremities , Motor exam as listed above    Non-Invasive Vascular Imaging   EVAR Duplex (02/07/19)  AAA sac size: 3.3 cm x 3.1 cm  no endoleak detected   Medical Decision Making   Joel Lin is a 78 y.o. male who presents for routine EVAR surveillance.   AAA sac continues to remodel and decrease in size; now 3.3cm  No endoleak noted  Recheck EVAR duplex in 1 year  Continue aspirin and statin   Dagoberto Ligas PA-C [facility] Office: [phone]  Clinic MD: Dr. Oneida Alar

## 2019-02-11 DIAGNOSIS — H6121 Impacted cerumen, right ear: Secondary | ICD-10-CM | POA: Diagnosis not present

## 2019-02-12 ENCOUNTER — Ambulatory Visit: Payer: Medicare HMO | Admitting: Internal Medicine

## 2019-02-12 ENCOUNTER — Encounter: Payer: Self-pay | Admitting: Internal Medicine

## 2019-02-12 ENCOUNTER — Ambulatory Visit (INDEPENDENT_AMBULATORY_CARE_PROVIDER_SITE_OTHER): Payer: Medicare HMO | Admitting: *Deleted

## 2019-02-12 VITALS — BP 128/68 | Ht 69.0 in | Wt 161.2 lb

## 2019-02-12 DIAGNOSIS — I493 Ventricular premature depolarization: Secondary | ICD-10-CM

## 2019-02-12 DIAGNOSIS — Z79899 Other long term (current) drug therapy: Secondary | ICD-10-CM | POA: Diagnosis not present

## 2019-02-12 DIAGNOSIS — Z95810 Presence of automatic (implantable) cardiac defibrillator: Secondary | ICD-10-CM

## 2019-02-12 DIAGNOSIS — I469 Cardiac arrest, cause unspecified: Secondary | ICD-10-CM

## 2019-02-12 DIAGNOSIS — I255 Ischemic cardiomyopathy: Secondary | ICD-10-CM

## 2019-02-12 DIAGNOSIS — I472 Ventricular tachycardia, unspecified: Secondary | ICD-10-CM

## 2019-02-12 LAB — CUP PACEART REMOTE DEVICE CHECK
Battery Remaining Longevity: 77 mo
Battery Remaining Percentage: 79 %
Battery Voltage: 2.98 V
Brady Statistic AP VP Percent: 2.7 %
Brady Statistic AP VS Percent: 44 %
Brady Statistic AS VP Percent: 1.2 %
Brady Statistic AS VS Percent: 52 %
Brady Statistic RA Percent Paced: 44 %
Brady Statistic RV Percent Paced: 3.9 %
Date Time Interrogation Session: 20200303070028
HighPow Impedance: 47 Ohm
HighPow Impedance: 47 Ohm
Implantable Lead Implant Date: 20180305
Implantable Lead Location: 753859
Implantable Lead Location: 753860
Implantable Lead Model: 7121
Implantable Pulse Generator Implant Date: 20180305
Lead Channel Impedance Value: 310 Ohm
Lead Channel Impedance Value: 400 Ohm
Lead Channel Pacing Threshold Amplitude: 0.5 V
Lead Channel Pacing Threshold Pulse Width: 0.5 ms
Lead Channel Pacing Threshold Pulse Width: 0.5 ms
Lead Channel Sensing Intrinsic Amplitude: 10.2 mV
Lead Channel Setting Pacing Amplitude: 1.5 V
Lead Channel Setting Pacing Amplitude: 1.875
Lead Channel Setting Sensing Sensitivity: 0.5 mV
MDC IDC LEAD IMPLANT DT: 20090427
MDC IDC MSMT LEADCHNL RA SENSING INTR AMPL: 3.8 mV
MDC IDC MSMT LEADCHNL RV PACING THRESHOLD AMPLITUDE: 1.625 V
MDC IDC SET LEADCHNL RV PACING PULSEWIDTH: 0.5 ms
Pulse Gen Serial Number: 1247854

## 2019-02-12 NOTE — Patient Instructions (Addendum)
Medication Instructions:  Your physician recommends that you continue on your current medications as directed. Please refer to the Current Medication list given to you today.  Labwork: You will have labs drawn today: BMP and Mg  Testing/Procedures: None ordered.  Follow-Up: Your physician recommends that you schedule a follow-up appointment in:   One Year with Dr Caryl Comes  Please send in a remote transmission in one month.   Any Other Special Instructions Will Be Listed Below (If Applicable).     If you need a refill on your cardiac medications before your next appointment, please call your pharmacy.

## 2019-02-12 NOTE — Progress Notes (Signed)
Patient Care Team: [name], MD as PCP - General (Internal Medicine) Deboraha Sprang, MD as Attending Physician (Cardiology) Jettie Booze, MD as Consulting Physician (Cardiology)   HPI  Joel Lin is a 78 y.o. male is seen in followup for aborted sudden cardiac death in the setting of ischemic heart disease with  prior PCI and mild depression of LV systolic function. He is status post ICD implantation. His device reached ERI 2/18 and he underwent generator change with DDD upgrade secondary to symptomatic sinus bradycardia.  He was hospitalized 7/16 with ventricular tachycardia failed termination with ATP and for which he was shocked. A second episode of monomorphic ventricular tachycardia terminated on its own. He was started on sotalol and his atenolol was discontinued.  He underwent stent grafting of the abdominal aortic aneurysm   DATE TEST    11/12    myoview   EF 45 % Old inferolateral   1/14    echo   EF 40 %   218 Echo EF 40-45%         Date Cr K Mg  6/17  0.95 4.6  *  2/18  1.01 4.4 2.4  9/19 0.87 4.2           Appropriate Therapy yes -- VT ATP  Inappropriate Therapy yes-- sinus tachycardia Antiarrhythmics Date  sotalol       The patient denies chest pain, shortness of breath, nocturnal dyspnea, orthopnea or peripheral edema.  There have been no palpitations, lightheadedness or syncope.   He has lost about 20 lbs       Past Medical History:  Diagnosis Date  . AAA (abdominal aortic aneurysm) ([place])   . Cardiac arrest ([place]) 03/29/2008   a. resuscitated OOH VF arrest  b. s/p STJ ICD  . Contrast media allergy   . Coronary artery disease    myoview 2011>>EF of 53% / large area of old inferolateral infarct but no reversible ischemia          . Hemorrhoid   . Hypercoagulable state ([place])    Questionable history of a borderline hypercoagulable state  . Hyperlipidemia   . Myocardial infarction ([place]) 1981  . Peripheral vascular  disease ([place])   . PVCs (premature ventricular contractions)    occasionally  . Ventricular tachycardia ([place])    a. appropriate ICD therpay b. on Sotalol    Past Surgical History:  Procedure Laterality Date  . ABDOMINAL AORTIC ENDOVASCULAR STENT GRAFT N/A 12/06/2013   Procedure: ABDOMINAL AORTIC ENDOVASCULAR STENT GRAFT;  Surgeon: Rosetta Posner, MD;  Location: [place];  Service: Vascular;  Laterality: N/A;  . CARDIAC CATHETERIZATION  04/04/2008    LAD stent patent, D1 OK, OM1 90/80/80% (63mm vessel), RCA 30%; EF 35%.  Marland Kitchen CARDIAC DEFIBRILLATOR PLACEMENT  04/07/2008    STJ single chamber ICD implanted for secondary prevention  . CHOLECYSTECTOMY    . CHOLECYSTECTOMY, LAPAROSCOPIC  08/13/2009   Calculous cholecystitis  . CORONARY ANGIOPLASTY WITH STENT PLACEMENT  1999   PTCA and stenting of his left anterior descending artery  . ICD REVISION N/A 02/13/2017   Procedure: Upgrade to Dual Chamber ICD;  Surgeon: Will Meredith Leeds, MD;  Location: [facility] CV [facility];  Service: Cardiovascular;  Laterality: N/A;  . LEFT HEART CATHETERIZATION WITH CORONARY ANGIOGRAM N/A 02/28/2013   Procedure: LEFT HEART CATHETERIZATION WITH CORONARY ANGIOGRAM;  Surgeon: Sherren Mocha, MD;  Location: [facility] CATH LAB;  Service: Cardiovascular;  Laterality: N/A;  . TONSILLECTOMY  Current Outpatient Medications  Medication Sig Dispense Refill  . aspirin 81 MG tablet Take 81 mg by mouth every evening.     . carvedilol (COREG) 3.125 MG tablet Take 3.125 mg by mouth 2 (two) times daily with a meal.    . cholecalciferol (VITAMIN D) 1000 units tablet Take 1,000 Units by mouth daily.    . colesevelam (WELCHOL) 625 MG tablet Take by mouth 2 (two) times daily with a meal.    . fexofenadine (ALLEGRA) 180 MG tablet Take 180 mg by mouth daily.    . fish oil-omega-3 fatty acids 1000 MG capsule Take 1 g by mouth daily.     . fluticasone (FLONASE) 50 MCG/ACT nasal spray Place 1 spray into both nostrils daily as needed for allergies or  rhinitis.    . Glucosamine Sulfate-MSM (GLUCOSAMINE-MSM DS PO) Take 1 tablet by mouth daily.    Marland Kitchen ibuprofen (ADVIL,MOTRIN) 200 MG tablet Take 400 mg by mouth daily as needed for moderate pain.     Marland Kitchen lisinopril (PRINIVIL,ZESTRIL) 2.5 MG tablet Take 1 tablet (2.5 mg total) by mouth daily. 90 tablet 3  . Melatonin 5 MG CAPS Take 5 mg by mouth at bedtime as needed (sleep).    . Multiple Vitamin (MULTIVITAMIN WITH MINERALS) TABS Take 1 tablet by mouth daily.    . nitroGLYCERIN (NITRODUR - DOSED IN MG/24 HR) 0.2 mg/hr patch Place 1 patch (0.2 mg total) onto the skin daily. 90 patch 2  . nitroGLYCERIN (NITROSTAT) 0.4 MG SL tablet Place 1 tablet (0.4 mg total) under the tongue every 5 (five) minutes as needed for chest pain ((MAX 3 DOSES)). 25 tablet 3  . rosuvastatin (CRESTOR) 5 MG tablet Take 5 mg by mouth every other day.    . sotalol (BETAPACE) 80 MG tablet Take 80 mg by mouth 2 (two) times daily.     No current facility-administered medications for this visit.     Allergies  Allergen Reactions  . Epinephrine Other (See Comments)    Elevated heartbeat  . Losartan Palpitations and Other (See Comments)    Irregular heartbeat  . Metoprolol Other (See Comments)    Irregular heartbeat  . Mydriacyl [Tropicamide] Other (See Comments)    Elevated heartbeat  . Phenylephrine Hcl Other (See Comments)    Increased heartrate  . Bisoprolol Other (See Comments)    Feels it does not agree with him  . Clopidogrel Bisulfate Hives       . Iodine Hives    "Renografin"  . Statins Other (See Comments)    Muscle cramps (crestor low dose is ok)    Review of Systems negative except from HPI and PMH  Physical Exam BP 128/68   Ht 5\' 9"  (1.753 m)   Wt 161 lb 3.2 oz (73.1 kg)   SpO2 97%   BMI 23.81 kg/m  Well developed and well nourished in no acute distress HENT normal Neck supple with JVP-flat Clear Device pocket well healed; without hematoma or erythema.  There is no tethering  Regular rate and  rhythm, no   gallop No murmur Abd-soft with active BS No Clubbing cyanosis  edema Skin-warm and dry A & Oriented  Grossly normal sensory and motor function     ECG sinus at 52 Interval 25/12/44  Assessment and  Plan  Aborted cardiac arrest   Ischemic Cardiomyopathy  Ventricular tachycardia  Chest pain  Sinus node dysfunction first-degree AV block  Implantable defibrillator The patient's device was interrogated.  The information was reviewed. No  changes were made in the programming.    No intercurrent Ventricular tachycardia  Sinus node dysfunction with chronotropic incompetence.  We have reviewed his histograms it elected to leave him with his current programming.  Without symptoms of ischemia  No interval ventricular tachycardia  We will need to check laboratories on his database.

## 2019-02-13 LAB — BASIC METABOLIC PANEL
BUN/Creatinine Ratio: 29 — ABNORMAL HIGH (ref 10–24)
BUN: 27 mg/dL (ref 8–27)
CO2: 26 mmol/L (ref 20–29)
Calcium: 9.3 mg/dL (ref 8.6–10.2)
Chloride: 102 mmol/L (ref 96–106)
Creatinine, Ser: 0.93 mg/dL (ref 0.76–1.27)
GFR calc Af Amer: 92 mL/min/{1.73_m2} (ref >59)
GFR calc non Af Amer: 79 mL/min/{1.73_m2} (ref >59)
Glucose: 82 mg/dL (ref 65–99)
POTASSIUM: 4.6 mmol/L (ref 3.5–5.2)
Sodium: 141 mmol/L (ref 134–144)

## 2019-02-13 LAB — MAGNESIUM: Magnesium: 2.3 mg/dL (ref 1.6–2.3)

## 2019-02-19 NOTE — Progress Notes (Signed)
Remote ICD transmission.   

## 2019-02-20 DIAGNOSIS — Z1389 Encounter for screening for other disorder: Secondary | ICD-10-CM | POA: Diagnosis not present

## 2019-02-20 DIAGNOSIS — I10 Essential (primary) hypertension: Secondary | ICD-10-CM | POA: Diagnosis not present

## 2019-02-20 DIAGNOSIS — J309 Allergic rhinitis, unspecified: Secondary | ICD-10-CM | POA: Diagnosis not present

## 2019-02-20 DIAGNOSIS — I2510 Atherosclerotic heart disease of native coronary artery without angina pectoris: Secondary | ICD-10-CM | POA: Diagnosis not present

## 2019-02-20 DIAGNOSIS — I255 Ischemic cardiomyopathy: Secondary | ICD-10-CM | POA: Diagnosis not present

## 2019-02-20 DIAGNOSIS — I714 Abdominal aortic aneurysm, without rupture: Secondary | ICD-10-CM | POA: Diagnosis not present

## 2019-02-20 DIAGNOSIS — Z0000 Encounter for general adult medical examination without abnormal findings: Secondary | ICD-10-CM | POA: Diagnosis not present

## 2019-02-20 DIAGNOSIS — E7800 Pure hypercholesterolemia, unspecified: Secondary | ICD-10-CM | POA: Diagnosis not present

## 2019-03-20 ENCOUNTER — Ambulatory Visit (INDEPENDENT_AMBULATORY_CARE_PROVIDER_SITE_OTHER): Payer: Medicare HMO | Admitting: *Deleted

## 2019-03-20 ENCOUNTER — Telehealth: Payer: Self-pay | Admitting: Cardiology

## 2019-03-20 DIAGNOSIS — I469 Cardiac arrest, cause unspecified: Secondary | ICD-10-CM

## 2019-03-20 DIAGNOSIS — I255 Ischemic cardiomyopathy: Secondary | ICD-10-CM

## 2019-03-20 LAB — CUP PACEART REMOTE DEVICE CHECK
Battery Remaining Longevity: 75 mo
Battery Remaining Percentage: 77 %
Battery Voltage: 2.98 V
Brady Statistic AP VP Percent: 7.4 %
Brady Statistic AP VS Percent: 42 %
Brady Statistic AS VP Percent: 1.5 %
Brady Statistic AS VS Percent: 48 %
Brady Statistic RA Percent Paced: 47 %
Brady Statistic RV Percent Paced: 9 %
Date Time Interrogation Session: 20200408135722
HighPow Impedance: 44 Ohm
HighPow Impedance: 44 Ohm
Implantable Lead Implant Date: 20090427
Implantable Lead Implant Date: 20180305
Implantable Lead Location: 753859
Implantable Lead Location: 753860
Implantable Lead Model: 7121
Implantable Pulse Generator Implant Date: 20180305
Lead Channel Impedance Value: 300 Ohm
Lead Channel Impedance Value: 410 Ohm
Lead Channel Pacing Threshold Amplitude: 0.5 V
Lead Channel Pacing Threshold Amplitude: 1.5 V
Lead Channel Pacing Threshold Pulse Width: 0.5 ms
Lead Channel Pacing Threshold Pulse Width: 0.5 ms
Lead Channel Sensing Intrinsic Amplitude: 3.9 mV
Lead Channel Sensing Intrinsic Amplitude: 9.3 mV
Lead Channel Setting Pacing Amplitude: 1.5 V
Lead Channel Setting Pacing Amplitude: 1.75 V
Lead Channel Setting Pacing Pulse Width: 0.5 ms
Lead Channel Setting Sensing Sensitivity: 0.5 mV
Pulse Gen Serial Number: 1247854

## 2019-03-20 NOTE — Telephone Encounter (Signed)
Pt called and stated that when he seen SK in March 2020 he wanted him to send a manual transmission in 1 month. Added pt to the remote monitoring schedule and instructed him how to send a manual transmission. Transmission received.

## 2019-03-21 ENCOUNTER — Other Ambulatory Visit: Payer: Self-pay

## 2019-03-29 ENCOUNTER — Telehealth: Payer: Self-pay | Admitting: Internal Medicine

## 2019-03-29 NOTE — Addendum Note (Signed)
Addended by: Douglass Rivers D on: 03/29/2019 09:13 AM   Modules accepted: Level of Service

## 2019-03-29 NOTE — Telephone Encounter (Signed)
New Message           Patient is calling to check status of his device  Transmission. Pls call to advise. [phone]

## 2019-03-29 NOTE — Telephone Encounter (Signed)
Informed pt that his remote transmission was normal. Once MD reviews we will call w/ any recommendations otherwise no news is good news. Pt verbalized understanding.

## 2019-03-29 NOTE — Progress Notes (Signed)
Remote ICD transmission.   

## 2019-04-26 ENCOUNTER — Other Ambulatory Visit: Payer: Self-pay | Admitting: Interventional Cardiology

## 2019-05-14 ENCOUNTER — Telehealth: Payer: Self-pay

## 2019-05-14 ENCOUNTER — Ambulatory Visit (INDEPENDENT_AMBULATORY_CARE_PROVIDER_SITE_OTHER): Payer: Medicare HMO | Admitting: *Deleted

## 2019-05-14 DIAGNOSIS — I255 Ischemic cardiomyopathy: Secondary | ICD-10-CM

## 2019-05-14 DIAGNOSIS — I469 Cardiac arrest, cause unspecified: Secondary | ICD-10-CM | POA: Diagnosis not present

## 2019-05-14 NOTE — Telephone Encounter (Signed)
Called pt to set up possible evisit. Pt would prefer an in person OV in August. Pt is aware he will receive a call from the office to schedule that appt.

## 2019-05-15 LAB — CUP PACEART REMOTE DEVICE CHECK
Battery Remaining Longevity: 73 mo
Battery Remaining Percentage: 76 %
Battery Voltage: 2.96 V
Brady Statistic AP VP Percent: 21 %
Brady Statistic AP VS Percent: 33 %
Brady Statistic AS VP Percent: 2.4 %
Brady Statistic AS VS Percent: 43 %
Brady Statistic RA Percent Paced: 52 %
Brady Statistic RV Percent Paced: 23 %
Date Time Interrogation Session: 20200602060017
HighPow Impedance: 49 Ohm
HighPow Impedance: 49 Ohm
Implantable Lead Implant Date: 20090427
Implantable Lead Implant Date: 20180305
Implantable Lead Location: 753859
Implantable Lead Location: 753860
Implantable Lead Model: 7121
Implantable Pulse Generator Implant Date: 20180305
Lead Channel Impedance Value: 350 Ohm
Lead Channel Impedance Value: 410 Ohm
Lead Channel Pacing Threshold Amplitude: 0.5 V
Lead Channel Pacing Threshold Amplitude: 1.625 V
Lead Channel Pacing Threshold Pulse Width: 0.5 ms
Lead Channel Pacing Threshold Pulse Width: 0.5 ms
Lead Channel Sensing Intrinsic Amplitude: 4.2 mV
Lead Channel Sensing Intrinsic Amplitude: 9.8 mV
Lead Channel Setting Pacing Amplitude: 1.5 V
Lead Channel Setting Pacing Amplitude: 1.875
Lead Channel Setting Pacing Pulse Width: 0.5 ms
Lead Channel Setting Sensing Sensitivity: 0.5 mV
Pulse Gen Serial Number: 1247854

## 2019-05-16 NOTE — Addendum Note (Signed)
Addended by: Drue Novel I on: 05/16/2019 08:58 AM   Modules accepted: Orders

## 2019-05-16 NOTE — Telephone Encounter (Signed)
   Primary Cardiologist:  No primary care provider on file.   Patient contacted.  History reviewed.  No symptoms to suggest any unstable cardiac conditions.  Based on discussion, with current pandemic situation, we will be postponing this appointment for Arthor Captain. Patient scheduled for 08/14/19. If symptoms change, he has been instructed to contact our office.   Cleon Gustin, RN  05/16/2019 8:57 AM         .

## 2019-05-20 ENCOUNTER — Ambulatory Visit: Payer: Medicare HMO | Admitting: Interventional Cardiology

## 2019-05-22 ENCOUNTER — Encounter: Payer: Self-pay | Admitting: Cardiology

## 2019-05-22 NOTE — Progress Notes (Signed)
Remote ICD transmission.   

## 2019-06-17 DIAGNOSIS — R69 Illness, unspecified: Secondary | ICD-10-CM | POA: Diagnosis not present

## 2019-06-27 DIAGNOSIS — R972 Elevated prostate specific antigen [PSA]: Secondary | ICD-10-CM | POA: Diagnosis not present

## 2019-07-04 DIAGNOSIS — N401 Enlarged prostate with lower urinary tract symptoms: Secondary | ICD-10-CM | POA: Diagnosis not present

## 2019-07-04 DIAGNOSIS — R350 Frequency of micturition: Secondary | ICD-10-CM | POA: Diagnosis not present

## 2019-07-04 DIAGNOSIS — R972 Elevated prostate specific antigen [PSA]: Secondary | ICD-10-CM | POA: Diagnosis not present

## 2019-07-16 ENCOUNTER — Other Ambulatory Visit: Payer: Self-pay | Admitting: Interventional Cardiology

## 2019-08-05 DIAGNOSIS — H04123 Dry eye syndrome of bilateral lacrimal glands: Secondary | ICD-10-CM | POA: Diagnosis not present

## 2019-08-05 DIAGNOSIS — H25813 Combined forms of age-related cataract, bilateral: Secondary | ICD-10-CM | POA: Diagnosis not present

## 2019-08-05 DIAGNOSIS — H52201 Unspecified astigmatism, right eye: Secondary | ICD-10-CM | POA: Diagnosis not present

## 2019-08-05 DIAGNOSIS — H43813 Vitreous degeneration, bilateral: Secondary | ICD-10-CM | POA: Diagnosis not present

## 2019-08-08 DIAGNOSIS — J029 Acute pharyngitis, unspecified: Secondary | ICD-10-CM | POA: Diagnosis not present

## 2019-08-13 ENCOUNTER — Ambulatory Visit (INDEPENDENT_AMBULATORY_CARE_PROVIDER_SITE_OTHER): Payer: Medicare HMO | Admitting: *Deleted

## 2019-08-13 DIAGNOSIS — I472 Ventricular tachycardia, unspecified: Secondary | ICD-10-CM

## 2019-08-13 DIAGNOSIS — I255 Ischemic cardiomyopathy: Secondary | ICD-10-CM

## 2019-08-13 LAB — CUP PACEART REMOTE DEVICE CHECK
Battery Remaining Longevity: 68 mo
Battery Remaining Percentage: 74 %
Battery Voltage: 2.95 V
Brady Statistic AP VP Percent: 29 %
Brady Statistic AP VS Percent: 26 %
Brady Statistic AS VP Percent: 4.4 %
Brady Statistic AS VS Percent: 40 %
Brady Statistic RA Percent Paced: 53 %
Brady Statistic RV Percent Paced: 33 %
Date Time Interrogation Session: 20200901060018
HighPow Impedance: 46 Ohm
HighPow Impedance: 46 Ohm
Implantable Lead Implant Date: 20090427
Implantable Lead Implant Date: 20180305
Implantable Lead Location: 753859
Implantable Lead Location: 753860
Implantable Lead Model: 7121
Implantable Pulse Generator Implant Date: 20180305
Lead Channel Impedance Value: 290 Ohm
Lead Channel Impedance Value: 410 Ohm
Lead Channel Pacing Threshold Amplitude: 0.5 V
Lead Channel Pacing Threshold Amplitude: 1.625 V
Lead Channel Pacing Threshold Pulse Width: 0.5 ms
Lead Channel Pacing Threshold Pulse Width: 0.5 ms
Lead Channel Sensing Intrinsic Amplitude: 11.1 mV
Lead Channel Sensing Intrinsic Amplitude: 4 mV
Lead Channel Setting Pacing Amplitude: 1.5 V
Lead Channel Setting Pacing Amplitude: 1.875
Lead Channel Setting Pacing Pulse Width: 0.5 ms
Lead Channel Setting Sensing Sensitivity: 0.5 mV
Pulse Gen Serial Number: 1247854

## 2019-08-14 ENCOUNTER — Ambulatory Visit: Payer: Medicare HMO | Admitting: Interventional Cardiology

## 2019-08-27 NOTE — Progress Notes (Signed)
Remote ICD transmission.   

## 2019-08-28 DIAGNOSIS — I2510 Atherosclerotic heart disease of native coronary artery without angina pectoris: Secondary | ICD-10-CM | POA: Diagnosis not present

## 2019-08-28 DIAGNOSIS — I714 Abdominal aortic aneurysm, without rupture: Secondary | ICD-10-CM | POA: Diagnosis not present

## 2019-08-28 DIAGNOSIS — I10 Essential (primary) hypertension: Secondary | ICD-10-CM | POA: Diagnosis not present

## 2019-08-28 DIAGNOSIS — M65342 Trigger finger, left ring finger: Secondary | ICD-10-CM | POA: Diagnosis not present

## 2019-08-28 DIAGNOSIS — I255 Ischemic cardiomyopathy: Secondary | ICD-10-CM | POA: Diagnosis not present

## 2019-08-28 DIAGNOSIS — E7800 Pure hypercholesterolemia, unspecified: Secondary | ICD-10-CM | POA: Diagnosis not present

## 2019-10-02 DIAGNOSIS — R69 Illness, unspecified: Secondary | ICD-10-CM | POA: Diagnosis not present

## 2019-10-13 NOTE — Progress Notes (Signed)
Cardiology Office Note   Date:  10/14/2019   ID:  Joel Lin, DOB 21-Oct-1941, MRN [id number]  PCP:  [name], MD    No chief complaint on file.  CAD/ Ischemic cardiomyopathy  Wt Readings from Last 3 Encounters:  10/14/19 162 lb (73.5 kg)  02/12/19 161 lb 3.2 oz (73.1 kg)  02/07/19 161 lb (73 kg)       History of Present Illness: Joel Lin is a 78 y.o. male  whohas a history of known ischemic heart disease. He has a history of a remote myocardial infarction at the age of 21. That was a large posterolateral myocardial infarction in 1981 in [place]. He moved to [place] in 1985. The patient had angioplasty and stent of his LAD in 1999- Dr. Acie Fredrickson. In 2009 he had an out of hospital cardiac arrest at home was successfully resuscitated and treated with hypothermia without significant neurologic sequelae. No CAD by cath at that time. He now has a defibrillator in place and is followed by Dr. Caryl Comes. He has a history of hypercholesterolemia. His last nuclear stress test was 10/24/11 and showed an ejection fraction of 45% and a large posterolateral scar but no ischemia. His last echocardiogram was in 2014 and showed an ejection fraction of 40%. On 12/06/13 the patient underwent a stent graft for his abdominal aortic aneurysm. The patient did well and was discharged without complications. The patient has his defibrillator monitored through Dr. Olin Pia office. In January 2013 he had 2 episodes of ventricular tachycardia which were asymptomatic. In March 2014 he was rehospitalized after having 3 consecutive shocks from his defibrillator. He underwent cardiac catheterization by Dr. Burt Knack on 02/28/13 who told him that his arteries have not shown any progression of atherosclerosis since his previous cardiac catheterization in 2009.   On 02/10/15 the patient presented to [facility] ED due to ICD shock. He stated he was playing golf and just finished 9 holes and was starting on the  back 9 when he suddenly felt dizzy. The next thing he knows his device shocked him. He called 911 and went back to the clubhouse where EMS arrived and gave him four aspirin.  Pt was transferred to [facility] for EP eval. He did well overnight without any ICD shocks. Troponin was negative. He was seen by Dr. Caryl Comes and found stable for discharge. He had been placed on po amiodarone but Dr. Caryl Comes held on initiating amiodarone given relative infrequency of V tach.  AAA repair in 12/14, stent graft with Dr. Donnetta Hutching.  He was hospitalizatedfollowing a ICD discharge was on 06/24/15. During that admission his atenolol was stopped and he was switched to sotalol initially 80 mg twice a day and subsequently increased to 120 mg twice a day. More recently the dose was decreased again to 80 mg twice a day. He has had no further discharges from his defibrillator. The patient reports that he was able to get his heart rate up to 114.  He had an AICD changeout in 3/18 with addition of an atrial lead.  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  Since the last visit, he Denies : Chest pain. Dizziness. Leg edema. Nitroglycerin use. Orthopnea. Palpitations. Paroxysmal nocturnal dyspnea. Shortness of breath. Syncope.  He continues to walk regularly.  He feels better today that he has in some time.  He came of Carvedilol at the recommendation of Dr. Caryl Comes.  He feels better.   BP at home has been well controlled. Systolics < [id number].  Step-mom passed away from [place] in [place].    Past Medical History:  Diagnosis Date  . AAA (abdominal aortic aneurysm) ([place])   . Cardiac arrest ([employer] [employer]) 03/29/2008   a. resuscitated OOH VF arrest  b. s/p STJ ICD  . Contrast media allergy   . Coronary artery disease    myoview 2011>>EF of 53% / large area of old inferolateral infarct but no reversible ischemia          . Hemorrhoid   . Hypercoagulable state ([place])    Questionable history of a borderline hypercoagulable state   . Hyperlipidemia   . Myocardial infarction ([place]) 1981  . Peripheral vascular disease ([place])   . PVCs (premature ventricular contractions)    occasionally  . Ventricular tachycardia ([place])    a. appropriate ICD therpay b. on Sotalol    Past Surgical History:  Procedure Laterality Date  . ABDOMINAL AORTIC ENDOVASCULAR STENT GRAFT N/A 12/06/2013   Procedure: ABDOMINAL AORTIC ENDOVASCULAR STENT GRAFT;  Surgeon: Rosetta Posner, MD;  Location: [place];  Service: Vascular;  Laterality: N/A;  . CARDIAC CATHETERIZATION  04/04/2008    LAD stent patent, D1 OK, OM1 90/80/80% (74mm vessel), RCA 30%; EF 35%.  Marland Kitchen CARDIAC DEFIBRILLATOR PLACEMENT  04/07/2008    STJ single chamber ICD implanted for secondary prevention  . CHOLECYSTECTOMY    . CHOLECYSTECTOMY, LAPAROSCOPIC  08/13/2009   Calculous cholecystitis  . CORONARY ANGIOPLASTY WITH STENT PLACEMENT  1999   PTCA and stenting of his left anterior descending artery  . ICD REVISION N/A 02/13/2017   Procedure: Upgrade to Dual Chamber ICD;  Surgeon: Will Meredith Leeds, MD;  Location: [place] [facility];  Service: Cardiovascular;  Laterality: N/A;  . LEFT HEART CATHETERIZATION WITH CORONARY ANGIOGRAM N/A 02/28/2013   Procedure: LEFT HEART CATHETERIZATION WITH CORONARY ANGIOGRAM;  Surgeon: Sherren Mocha, MD;  Location: [facility] CATH LAB;  Service: Cardiovascular;  Laterality: N/A;  . TONSILLECTOMY       Current Outpatient Medications  Medication Sig Dispense Refill  . aspirin 81 MG tablet Take 81 mg by mouth every evening.     . cholecalciferol (VITAMIN D) 1000 units tablet Take 1,000 Units by mouth daily.    . fexofenadine (ALLEGRA) 180 MG tablet Take 180 mg by mouth daily.    . fish oil-omega-3 fatty acids 1000 MG capsule Take 1 g by mouth daily.     . fluticasone (FLONASE) 50 MCG/ACT nasal spray Place 1 spray into both nostrils daily as needed for allergies or rhinitis.    . Glucosamine Sulfate-MSM (GLUCOSAMINE-MSM DS PO) Take 1 tablet by mouth daily.    Marland Kitchen  ibuprofen (ADVIL,MOTRIN) 200 MG tablet Take 400 mg by mouth daily as needed for moderate pain.     Marland Kitchen lisinopril (ZESTRIL) 2.5 MG tablet TAKE 1 TABLET DAILY DOSE   DECREASE 90 tablet 1  . Melatonin 5 MG CAPS Take 5 mg by mouth at bedtime as needed (sleep).    . Multiple Vitamin (MULTIVITAMIN WITH MINERALS) TABS Take 1 tablet by mouth daily.    . nitroGLYCERIN (NITRODUR - DOSED IN MG/24 HR) 0.2 mg/hr patch Place 1 patch (0.2 mg total) onto the skin daily. 90 patch 2  . nitroGLYCERIN (NITROSTAT) 0.4 MG SL tablet Place 1 tablet (0.4 mg total) under the tongue every 5 (five) minutes as needed for chest pain ((MAX 3 DOSES)). 25 tablet 3  . rosuvastatin (CRESTOR) 5 MG tablet TAKE 1 TABLET EVERY OTHER  DAY 45 tablet 2  . sotalol (BETAPACE) 80 MG  tablet TAKE 1 TABLET TWICE A DAY 180 tablet 2  . WELCHOL 625 MG tablet TAKE 1 TABLET TWICE DAILY  WITH MEALS 180 tablet 2   No current facility-administered medications for this visit.     Allergies:   Epinephrine, Losartan, Metoprolol, Mydriacyl [tropicamide], Phenylephrine hcl, Bisoprolol, Clopidogrel bisulfate, Iodine, and Statins    Social History:  The patient  reports that he quit smoking about 39 years ago. His smoking use included cigarettes. He has a 20.00 pack-year smoking history. He has never used smokeless tobacco. He reports that he does not drink alcohol or use drugs.   Family History:  The patient's family history includes Heart disease in his father and mother; Hyperlipidemia in his father and mother; Mitral valve prolapse in his sister; Stomach cancer in an other family member.    ROS:  Please see the history of present illness.   Otherwise, review of systems are positive for - feels well- staying away from crowds.   All other systems are reviewed and negative.    PHYSICAL EXAM: VS:  BP 140/74   Pulse (!) 53   Ht 5\' 9"  (1.753 m)   Wt 162 lb (73.5 kg)   SpO2 98%   BMI 23.92 kg/m  , BMI Body mass index is 23.92 kg/m. GEN: Well  nourished, well developed, in no acute distress  HEENT: normal  Neck: no JVD, carotid bruits, or masses Cardiac: RRR; no murmurs, rubs, or gallops,no edema  Respiratory:  clear to auscultation bilaterally, normal work of breathing GI: soft, nontender, nondistended, + BS MS: no deformity or atrophy  Skin: warm and dry, no rash Neuro:  Strength and sensation are intact Psych: euthymic mood, full affect   EKG:   The ekg ordered today demonstrates sinsu bradycardia, prolonged PR interval   Recent Labs: 02/12/2019: BUN 27; Creatinine, Ser 0.93; Magnesium 2.3; Potassium 4.6; Sodium 141   Lipid Panel    Component Value Date/Time   CHOL 127 05/04/2018 0902   TRIG 71 05/04/2018 0902   HDL 33 (L) 05/04/2018 0902   CHOLHDL 3.8 05/04/2018 0902   CHOLHDL 3.8 05/27/2016 0939   VLDL 33 (H) 05/27/2016 0939   LDLCALC 80 05/04/2018 0902   LDLDIRECT 80.0 08/12/2015 0806     Other studies Reviewed: Additional studies/ records that were reviewed today with results demonstrating: labs reviewed.   ASSESSMENT AND PLAN:  1. CAD/Old MI: No angina.  No CHF sx.  COntine aggressive secondary prevention.  2. Chronic systolic heart failure: EF 40-45% in 2018.   3. AAA: s/p endovascular stent graft 4. Hyperlipidemia: Welchol will not be covered by insurance in 2021.  Will try to find a substitute. 5. AICD: FOllowed by EP.   Current medicines are reviewed at length with the patient today.  The patient concerns regarding his medicines were addressed.  The following changes have been made:  No change  Labs/ tests ordered today include:  No orders of the defined types were placed in this encounter.   Recommend 150 minutes/week of aerobic exercise Low fat, low carb, high fiber diet recommended  Disposition:   FU in 1 year   Signed, Larae Grooms, MD  10/14/2019 3:01 PM    [facility] [address], [place], [place]  [address] Phone: [phone]; Fax: [phone]

## 2019-10-14 ENCOUNTER — Encounter: Payer: Self-pay | Admitting: Interventional Cardiology

## 2019-10-14 ENCOUNTER — Ambulatory Visit: Payer: Medicare HMO | Admitting: Interventional Cardiology

## 2019-10-14 ENCOUNTER — Other Ambulatory Visit: Payer: Self-pay

## 2019-10-14 VITALS — BP 140/74 | HR 53 | Ht 69.0 in | Wt 162.0 lb

## 2019-10-14 DIAGNOSIS — I25118 Atherosclerotic heart disease of native coronary artery with other forms of angina pectoris: Secondary | ICD-10-CM | POA: Diagnosis not present

## 2019-10-14 DIAGNOSIS — I5022 Chronic systolic (congestive) heart failure: Secondary | ICD-10-CM | POA: Diagnosis not present

## 2019-10-14 DIAGNOSIS — I252 Old myocardial infarction: Secondary | ICD-10-CM

## 2019-10-14 DIAGNOSIS — E782 Mixed hyperlipidemia: Secondary | ICD-10-CM | POA: Diagnosis not present

## 2019-10-14 DIAGNOSIS — I714 Abdominal aortic aneurysm, without rupture, unspecified: Secondary | ICD-10-CM

## 2019-10-14 MED ORDER — SOTALOL HCL 80 MG PO TABS: 80.0000 mg | ORAL_TABLET | Freq: Two times a day (BID) | ORAL | 3 refills | Status: DC

## 2019-10-14 MED ORDER — LISINOPRIL 2.5 MG PO TABS: ORAL_TABLET | ORAL | 3 refills | Status: DC

## 2019-10-14 MED ORDER — ROSUVASTATIN CALCIUM 5 MG PO TABS: 5.0000 mg | ORAL_TABLET | ORAL | 3 refills | Status: DC

## 2019-10-14 NOTE — Patient Instructions (Signed)
Medication Instructions:  Your physician recommends that you continue on your current medications as directed. Please refer to the Current Medication list given to you today.  *If you need a refill on your cardiac medications before your next appointment, please call your pharmacy*  Lab Work: None ordered  If you have labs (blood work) drawn today and your tests are completely normal, you will receive your results only by: . MyChart Message (if you have MyChart) OR . A paper copy in the mail If you have any lab test that is abnormal or we need to change your treatment, we will call you to review the results.  Testing/Procedures: None ordered  Follow-Up: At [facility], you and your health needs are our priority.  As part of our continuing mission to provide you with exceptional heart care, we have created designated Provider Care Teams.  These Care Teams include your primary Cardiologist (physician) and Advanced Practice Providers (APPs -  Physician Assistants and Nurse Practitioners) who all work together to provide you with the care you need, when you need it.  Your next appointment:   12 months  The format for your next appointment:   In Person  Provider:   You may see Jay Varanasi, MD or one of the following Advanced Practice Providers on your designated Care Team:    Dayna Dunn, PA-C  Michele Lenze, PA-C   Other Instructions    

## 2019-10-18 DIAGNOSIS — R21 Rash and other nonspecific skin eruption: Secondary | ICD-10-CM | POA: Diagnosis not present

## 2019-10-21 ENCOUNTER — Other Ambulatory Visit: Payer: Self-pay | Admitting: Interventional Cardiology

## 2019-11-01 ENCOUNTER — Telehealth: Payer: Self-pay | Admitting: Interventional Cardiology

## 2019-11-01 MED ORDER — EZETIMIBE 10 MG PO TABS: 10.0000 mg | ORAL_TABLET | Freq: Every day | ORAL | 3 refills | Status: DC

## 2019-11-01 NOTE — Telephone Encounter (Signed)
Called and spoke to the patient. Patient is taking welchol 625 mg BID. His insurance will no longer cover this after the first of the year. Spoke with PharmD and patient can switch to zetia 10 mg QD. Will send in Rx to preferred pharmacy.

## 2019-11-01 NOTE — Telephone Encounter (Signed)
New message  Patient is calling in with follow up questions about his last appointment. Please give patient a call back to assist.

## 2019-11-12 ENCOUNTER — Ambulatory Visit (INDEPENDENT_AMBULATORY_CARE_PROVIDER_SITE_OTHER): Payer: Medicare HMO | Admitting: *Deleted

## 2019-11-12 DIAGNOSIS — I255 Ischemic cardiomyopathy: Secondary | ICD-10-CM

## 2019-11-12 LAB — CUP PACEART REMOTE DEVICE CHECK
Battery Remaining Longevity: 65 mo
Battery Remaining Percentage: 71 %
Battery Voltage: 2.95 V
Brady Statistic AP VP Percent: 31 %
Brady Statistic AP VS Percent: 23 %
Brady Statistic AS VP Percent: 4.9 %
Brady Statistic AS VS Percent: 40 %
Brady Statistic RA Percent Paced: 53 %
Brady Statistic RV Percent Paced: 36 %
Date Time Interrogation Session: 20201201020016
HighPow Impedance: 47 Ohm
HighPow Impedance: 47 Ohm
Implantable Lead Implant Date: 20090427
Implantable Lead Implant Date: 20180305
Implantable Lead Location: 753859
Implantable Lead Location: 753860
Implantable Lead Model: 7121
Implantable Pulse Generator Implant Date: 20180305
Lead Channel Impedance Value: 290 Ohm
Lead Channel Impedance Value: 410 Ohm
Lead Channel Pacing Threshold Amplitude: 0.5 V
Lead Channel Pacing Threshold Amplitude: 1.625 V
Lead Channel Pacing Threshold Pulse Width: 0.5 ms
Lead Channel Pacing Threshold Pulse Width: 0.5 ms
Lead Channel Sensing Intrinsic Amplitude: 1.8 mV
Lead Channel Sensing Intrinsic Amplitude: 10.4 mV
Lead Channel Setting Pacing Amplitude: 1.5 V
Lead Channel Setting Pacing Amplitude: 1.875
Lead Channel Setting Pacing Pulse Width: 0.5 ms
Lead Channel Setting Sensing Sensitivity: 0.5 mV
Pulse Gen Serial Number: 1247854

## 2019-11-26 ENCOUNTER — Telehealth: Payer: Self-pay | Admitting: Interventional Cardiology

## 2019-11-26 NOTE — Telephone Encounter (Signed)
Called [phone] and made them aware that PA is not required. The said that they received the information. Called and spoke to patient and he has received confirmation that his pharmacy is processing the order. Denies any additional concerns at this time. Instructed patient to let us know if he has any trouble getting his zetia or we can assist him further. He verbalized understanding and thanked me for the call.

## 2019-11-26 NOTE — Telephone Encounter (Signed)
New Message  Prior authorization needed for ezetimibe (ZETIA) 10 MG tablet is needed by [facility]. Can be reached at [phone]. Please assist.

## 2019-12-07 NOTE — Progress Notes (Signed)
ICD remote 

## 2020-01-01 DIAGNOSIS — R69 Illness, unspecified: Secondary | ICD-10-CM | POA: Diagnosis not present

## 2020-01-16 DIAGNOSIS — R69 Illness, unspecified: Secondary | ICD-10-CM | POA: Diagnosis not present

## 2020-02-11 ENCOUNTER — Ambulatory Visit (INDEPENDENT_AMBULATORY_CARE_PROVIDER_SITE_OTHER): Payer: Medicare HMO | Admitting: *Deleted

## 2020-02-11 ENCOUNTER — Other Ambulatory Visit: Payer: Self-pay | Admitting: *Deleted

## 2020-02-11 DIAGNOSIS — L578 Other skin changes due to chronic exposure to nonionizing radiation: Secondary | ICD-10-CM | POA: Diagnosis not present

## 2020-02-11 DIAGNOSIS — I255 Ischemic cardiomyopathy: Secondary | ICD-10-CM | POA: Diagnosis not present

## 2020-02-11 DIAGNOSIS — Z95828 Presence of other vascular implants and grafts: Secondary | ICD-10-CM

## 2020-02-11 DIAGNOSIS — L814 Other melanin hyperpigmentation: Secondary | ICD-10-CM | POA: Diagnosis not present

## 2020-02-11 DIAGNOSIS — L821 Other seborrheic keratosis: Secondary | ICD-10-CM | POA: Diagnosis not present

## 2020-02-11 DIAGNOSIS — D225 Melanocytic nevi of trunk: Secondary | ICD-10-CM | POA: Diagnosis not present

## 2020-02-11 DIAGNOSIS — Z23 Encounter for immunization: Secondary | ICD-10-CM | POA: Diagnosis not present

## 2020-02-11 LAB — CUP PACEART REMOTE DEVICE CHECK
Battery Remaining Longevity: 64 mo
Battery Remaining Percentage: 69 %
Battery Voltage: 2.95 V
Brady Statistic AP VP Percent: 31 %
Brady Statistic AP VS Percent: 23 %
Brady Statistic AS VP Percent: 4.5 %
Brady Statistic AS VS Percent: 41 %
Brady Statistic RA Percent Paced: 53 %
Brady Statistic RV Percent Paced: 35 %
Date Time Interrogation Session: 20210302020020
HighPow Impedance: 47 Ohm
HighPow Impedance: 47 Ohm
Implantable Lead Implant Date: 20090427
Implantable Lead Implant Date: 20180305
Implantable Lead Location: 753859
Implantable Lead Location: 753860
Implantable Lead Model: 7121
Implantable Pulse Generator Implant Date: 20180305
Lead Channel Impedance Value: 300 Ohm
Lead Channel Impedance Value: 400 Ohm
Lead Channel Pacing Threshold Amplitude: 0.5 V
Lead Channel Pacing Threshold Amplitude: 1.25 V
Lead Channel Pacing Threshold Pulse Width: 0.5 ms
Lead Channel Pacing Threshold Pulse Width: 0.5 ms
Lead Channel Sensing Intrinsic Amplitude: 4 mV
Lead Channel Sensing Intrinsic Amplitude: 9.9 mV
Lead Channel Setting Pacing Amplitude: 1.5 V
Lead Channel Setting Pacing Amplitude: 1.5 V
Lead Channel Setting Pacing Pulse Width: 0.5 ms
Lead Channel Setting Sensing Sensitivity: 0.5 mV
Pulse Gen Serial Number: 1247854

## 2020-02-12 ENCOUNTER — Telehealth (HOSPITAL_COMMUNITY): Payer: Self-pay

## 2020-02-12 NOTE — Progress Notes (Signed)
ICD Remote  

## 2020-02-12 NOTE — Telephone Encounter (Signed)

## 2020-02-13 ENCOUNTER — Ambulatory Visit (HOSPITAL_COMMUNITY)
Admission: RE | Admit: 2020-02-13 | Discharge: 2020-02-13 | Disposition: A | Discharge status: Home / Self Care | Payer: Medicare HMO | Source: Ambulatory Visit | Attending: Surgery | Admitting: Surgery

## 2020-02-13 ENCOUNTER — Ambulatory Visit: Payer: Medicare HMO | Admitting: Physician Assistant

## 2020-02-13 ENCOUNTER — Other Ambulatory Visit: Payer: Self-pay

## 2020-02-13 VITALS — BP 132/84 | HR 50 | Temp 97.0°F | Resp 16 | Ht 69.0 in | Wt 162.0 lb

## 2020-02-13 DIAGNOSIS — Z95828 Presence of other vascular implants and grafts: Secondary | ICD-10-CM

## 2020-02-13 DIAGNOSIS — I714 Abdominal aortic aneurysm, without rupture, unspecified: Secondary | ICD-10-CM

## 2020-02-13 NOTE — Progress Notes (Signed)
History of Present Illness:  Patient is a 79 y.o. year old male who presents for evaluation of abdominal aortic aneurysm s/p EVAR by Dr. Donnetta Hutching in 2014.  He is here today for EVAR duplex surveillance.  He denies abdominal pain or lumbar pain and no symptoms of claudication.     Past medical history also significant for MI requiring stenting as well as a cardiac arrest resulting in a ICD.  He follows regularly with his cardiologist Dr. Jens Som.  He is taking aspirin daily.  He is on statin daily and recently started on zetia.  He is a former smoker who quit in 1981 after his first heart attack.  The patient's PMH, PSH, SH, and FamHx were reviewed and are unchanged from prior visit.  Past Medical History:  Diagnosis Date  . AAA (abdominal aortic aneurysm) ([place])   . Cardiac arrest ([place]) 03/29/2008   a. resuscitated OOH VF arrest  b. s/p STJ ICD  . Contrast media allergy   . Coronary artery disease    myoview 2011>>EF of 53% / large area of old inferolateral infarct but no reversible ischemia          . Hemorrhoid   . Hypercoagulable state ([place])    Questionable history of a borderline hypercoagulable state  . Hyperlipidemia   . Myocardial infarction ([place]) 1981  . Peripheral vascular disease ([place])   . PVCs (premature ventricular contractions)    occasionally  . Ventricular tachycardia ([place])    a. appropriate ICD therpay b. on Sotalol    Past Surgical History:  Procedure Laterality Date  . ABDOMINAL AORTIC ENDOVASCULAR STENT GRAFT N/A 12/06/2013   Procedure: ABDOMINAL AORTIC ENDOVASCULAR STENT GRAFT;  Surgeon: Rosetta Posner, MD;  Location: [facility];  Service: Vascular;  Laterality: N/A;  . CARDIAC CATHETERIZATION  04/04/2008    LAD stent patent, D1 OK, OM1 90/80/80% (70mm vessel), RCA 30%; EF 35%.  Marland Kitchen CARDIAC DEFIBRILLATOR PLACEMENT  04/07/2008    STJ single chamber ICD implanted for secondary prevention  . CHOLECYSTECTOMY    . CHOLECYSTECTOMY, LAPAROSCOPIC  08/13/2009   Calculous  cholecystitis  . CORONARY ANGIOPLASTY WITH STENT PLACEMENT  1999   PTCA and stenting of his left anterior descending artery  . ICD REVISION N/A 02/13/2017   Procedure: Upgrade to Dual Chamber ICD;  Surgeon: Will Meredith Leeds, MD;  Location: [place] [facility];  Service: Cardiovascular;  Laterality: N/A;  . LEFT HEART CATHETERIZATION WITH CORONARY ANGIOGRAM N/A 02/28/2013   Procedure: LEFT HEART CATHETERIZATION WITH CORONARY ANGIOGRAM;  Surgeon: Sherren Mocha, MD;  [facility];  Service: Cardiovascular;  Laterality: N/A;  . TONSILLECTOMY       Social History Social History   Tobacco Use  . Smoking status: Former Smoker    Packs/day: 1.00    Years: 20.00    Pack years: 20.00    Types: Cigarettes    Quit date: 12/23/1979    Years since quitting: 40.1  . Smokeless tobacco: Never Used  Substance Use Topics  . Alcohol use: No    Alcohol/week: 0.0 standard drinks  . Drug use: No    Family History Family History  Problem Relation Age of Onset  . Hyperlipidemia Mother   . Heart disease Mother   . Heart disease Father        before age 21  . Hyperlipidemia Father   . Mitral valve prolapse Sister   . Stomach cancer Other   . Heart attack Neg Hx   .  Hypertension Neg Hx     Allergies  Allergies  Allergen Reactions  . Epinephrine Other (See Comments)    Elevated heartbeat  . Losartan Palpitations and Other (See Comments)    Irregular heartbeat  . Metoprolol Other (See Comments)    Irregular heartbeat  . Mydriacyl [Tropicamide] Other (See Comments)    Elevated heartbeat  . Phenylephrine Hcl Other (See Comments)    Increased heartrate  . Bisoprolol Other (See Comments)    Feels it does not agree with him  . Clopidogrel Bisulfate Hives       . Iodine Hives    "Renografin"  . Statins Other (See Comments)    Muscle cramps (crestor low dose is ok)     Current Outpatient Medications  Medication Sig Dispense Refill  . aspirin 81 MG tablet Take 81 mg by mouth  every evening.     . cholecalciferol (VITAMIN D) 1000 units tablet Take 1,000 Units by mouth daily.    Marland Kitchen ezetimibe (ZETIA) 10 MG tablet Take 1 tablet (10 mg total) by mouth daily. 90 tablet 3  . fexofenadine (ALLEGRA) 180 MG tablet Take 180 mg by mouth daily.    . fish oil-omega-3 fatty acids 1000 MG capsule Take 1 g by mouth daily.     . fluticasone (FLONASE) 50 MCG/ACT nasal spray Place 1 spray into both nostrils daily as needed for allergies or rhinitis.    . Glucosamine Sulfate-MSM (GLUCOSAMINE-MSM DS PO) Take 1 tablet by mouth daily.    Marland Kitchen ibuprofen (ADVIL,MOTRIN) 200 MG tablet Take 400 mg by mouth daily as needed for moderate pain.     Marland Kitchen lisinopril (ZESTRIL) 2.5 MG tablet TAKE 1 TABLET DAILY DOSE   DECREASE 90 tablet 3  . Melatonin 5 MG CAPS Take 5 mg by mouth at bedtime as needed (sleep).    . Multiple Vitamin (MULTIVITAMIN WITH MINERALS) TABS Take 1 tablet by mouth daily.    . nitroGLYCERIN (NITRODUR - DOSED IN MG/24 HR) 0.2 mg/hr patch APPLY 1 PATCH ONTO THE SKINDAILY 90 patch 2  . nitroGLYCERIN (NITROSTAT) 0.4 MG SL tablet Place 1 tablet (0.4 mg total) under the tongue every 5 (five) minutes as needed for chest pain ((MAX 3 DOSES)). 25 tablet 3  . rosuvastatin (CRESTOR) 5 MG tablet Take 1 tablet (5 mg total) by mouth every other day. 45 tablet 3  . sotalol (BETAPACE) 80 MG tablet Take 1 tablet (80 mg total) by mouth 2 (two) times daily. 180 tablet 3   No current facility-administered medications for this visit.    ROS:   General:  No weight loss, Fever, chills  HEENT: No recent headaches, no nasal bleeding, no visual changes, no sore throat  Neurologic: No dizziness, blackouts, seizures. No recent symptoms of stroke or mini- stroke. No recent episodes of slurred speech, or temporary blindness.  Cardiac: No recent episodes of chest pain/pressure, no shortness of breath at rest.  No shortness of breath with exertion.  Denies history of atrial fibrillation or irregular  heartbeat  Vascular: No history of rest pain in feet.  No history of claudication.  No history of non-healing ulcer, No history of DVT   Pulmonary: No home oxygen, no productive cough, no hemoptysis,  No asthma or wheezing  Musculoskeletal:  [ ]  Arthritis, [ ]  Low back pain,  [ ]  Joint pain  Hematologic:No history of hypercoagulable state.  No history of easy bleeding.  No history of anemia  Gastrointestinal: No hematochezia or melena,  No gastroesophageal reflux, no  trouble swallowing  Urinary: [ ]  chronic Kidney disease, [ ]  on HD - [ ]  MWF or [ ]  TTHS, [ ]  Burning with urination, [ ]  Frequent urination, [ ]  Difficulty urinating;   Skin: No rashes  Psychological: No history of anxiety,  No history of depression   Physical Examination  Vitals:   02/13/20 0937  BP: 132/84  Pulse: (!) 50  Resp: 16  Temp: (!) 97 F (36.1 C)  TempSrc: Oral  SpO2: 96%  Weight: 162 lb (73.5 kg)  Height: 5\' 9"  (1.753 m)    Body mass index is 23.92 kg/m.  General:  Alert and oriented, no acute distress HEENT: Normal Pulmonary: Clear to auscultation bilaterally Cardiac: Regular Rate and Rhythm Gastrointestinal: Soft, non-tender, non-distended, no mass, no scars Skin: No rash Extremity Pulses:  2+ radial; no wounds or tissue changes BLE; feet are symmetrically warm Musculoskeletal: No deformity or edema  Neurologic: Upper and lower extremity motor 5/5 and symmetric  DATA:  EVAR duplex AAA sac size 2.95cm at largest diameter   PLAN: Joel Lin is a 79 y.o. male who presents for surveillance of EVAR done in 2014   Patent endograft without endoleaks  AAA sac size continues to decrease, now 2.95cm at largest diameter  Recheck EVAR duplex in 1 year  Continue aspirin and statin daily   Dagoberto Ligas PA-C  [facility] Office: [phone]  Clinic MD Dr. Oneida Alar

## 2020-02-14 ENCOUNTER — Other Ambulatory Visit: Payer: Self-pay | Admitting: *Deleted

## 2020-02-14 DIAGNOSIS — Z95828 Presence of other vascular implants and grafts: Secondary | ICD-10-CM

## 2020-02-16 DIAGNOSIS — I495 Sick sinus syndrome: Secondary | ICD-10-CM | POA: Insufficient documentation

## 2020-02-17 ENCOUNTER — Other Ambulatory Visit: Payer: Self-pay

## 2020-02-17 ENCOUNTER — Encounter (INDEPENDENT_AMBULATORY_CARE_PROVIDER_SITE_OTHER): Payer: Self-pay

## 2020-02-17 ENCOUNTER — Encounter: Payer: Self-pay | Admitting: Internal Medicine

## 2020-02-17 ENCOUNTER — Ambulatory Visit: Payer: Medicare HMO | Admitting: Internal Medicine

## 2020-02-17 VITALS — BP 138/70 | HR 53 | Ht 69.0 in | Wt 162.6 lb

## 2020-02-17 DIAGNOSIS — I472 Ventricular tachycardia, unspecified: Secondary | ICD-10-CM

## 2020-02-17 DIAGNOSIS — I255 Ischemic cardiomyopathy: Secondary | ICD-10-CM | POA: Diagnosis not present

## 2020-02-17 DIAGNOSIS — Z79899 Other long term (current) drug therapy: Secondary | ICD-10-CM | POA: Diagnosis not present

## 2020-02-17 DIAGNOSIS — Z95810 Presence of automatic (implantable) cardiac defibrillator: Secondary | ICD-10-CM | POA: Diagnosis not present

## 2020-02-17 DIAGNOSIS — I495 Sick sinus syndrome: Secondary | ICD-10-CM | POA: Diagnosis not present

## 2020-02-17 NOTE — Progress Notes (Signed)
Patient Care Team: [name], MD as PCP - General (Internal Medicine) Deboraha Sprang, MD as Attending Physician (Cardiology) Jettie Booze, MD as Consulting Physician (Cardiology)   HPI  Joel Lin is a 79 y.o. male is seen in followup for aborted sudden cardiac death in the setting of ischemic heart disease with  prior PCI and mild depression of LV systolic function. He is status post ICD implantation. His device reached ERI 2/18 and he underwent generator change with DDD upgrade secondary to symptomatic sinus bradycardia.  He was hospitalized 7/16 with ventricular tachycardia failed termination with ATP and for which he was shocked. A second episode of monomorphic ventricular tachycardia terminated on its own. He was started on sotalol and his atenolol was discontinued.  He had been on carvedilol which we discontinued.  He feels much better off of it  The patient denies chest pain, shortness of breath, nocturnal dyspnea, orthopnea or peripheral edema.  There have been no palpitations, lightheadedness or syncope.     He and his wife have both under gone a weight watcher program; he has lost 20-30 pounds and she has lost 80 pounds.  He underwent stent grafting of the abdominal aortic aneurysm   DATE TEST EF   11/12    myoview    45 % Old inferolateral   1/14    echo   40 %   218 Echo E 40-45%         Date Cr K Mg  6/17  0.95 4.6  *  2/18  1.01 4.4 2.4  9/19 0.87 4.2   3/20   2.3  9/20 0.92 4.5       Appropriate Therapy yes -- VT ATP  Inappropriate Therapy yes-- sinus tachycardia Antiarrhythmics Date  sotalol              Past Medical History:  Diagnosis Date  . AAA (abdominal aortic aneurysm) ([place])   . Cardiac arrest ([place]) 03/29/2008   a. resuscitated OOH VF arrest  b. s/p STJ ICD  . Contrast media allergy   . Coronary artery disease    myoview 2011>>EF of 53% / large area of old inferolateral infarct but no reversible ischemia           . Hemorrhoid   . Hypercoagulable state ([place])    Questionable history of a borderline hypercoagulable state  . Hyperlipidemia   . Myocardial infarction ([place]) 1981  . Peripheral vascular disease ([place])   . PVCs (premature ventricular contractions)    occasionally  . Ventricular tachycardia ([place])    a. appropriate ICD therpay b. on Sotalol    Past Surgical History:  Procedure Laterality Date  . ABDOMINAL AORTIC ENDOVASCULAR STENT GRAFT N/A 12/06/2013   Procedure: ABDOMINAL AORTIC ENDOVASCULAR STENT GRAFT;  Surgeon: Rosetta Posner, MD;  Location: [place];  Service: Vascular;  Laterality: N/A;  . CARDIAC CATHETERIZATION  04/04/2008    LAD stent patent, D1 OK, OM1 90/80/80% (34mm vessel), RCA 30%; EF 35%.  Marland Kitchen CARDIAC DEFIBRILLATOR PLACEMENT  04/07/2008    STJ single chamber ICD implanted for secondary prevention  . CHOLECYSTECTOMY    . CHOLECYSTECTOMY, LAPAROSCOPIC  08/13/2009   Calculous cholecystitis  . CORONARY ANGIOPLASTY WITH STENT PLACEMENT  1999   PTCA and stenting of his left anterior descending artery  . ICD REVISION N/A 02/13/2017   Procedure: Upgrade to Dual Chamber ICD;  Surgeon: Will Meredith Leeds, MD;  Location: [place] CV [facility];  Service: Cardiovascular;  Laterality: N/A;  . LEFT HEART CATHETERIZATION WITH CORONARY ANGIOGRAM N/A 02/28/2013   Procedure: LEFT HEART CATHETERIZATION WITH CORONARY ANGIOGRAM;  Surgeon: Sherren Mocha, MD;  Location: [facility] CATH LAB;  Service: Cardiovascular;  Laterality: N/A;  . TONSILLECTOMY      Current Outpatient Medications  Medication Sig Dispense Refill  . aspirin 81 MG tablet Take 81 mg by mouth every evening.     . cholecalciferol (VITAMIN D) 1000 units tablet Take 1,000 Units by mouth daily.    Marland Kitchen ezetimibe (ZETIA) 10 MG tablet Take 1 tablet (10 mg total) by mouth daily. 90 tablet 3  . fexofenadine (ALLEGRA) 180 MG tablet Take 180 mg by mouth daily.    . fish oil-omega-3 fatty acids 1000 MG capsule Take 1 g by mouth daily.     . fluticasone  (FLONASE) 50 MCG/ACT nasal spray Place 1 spray into both nostrils daily as needed for allergies or rhinitis.    . Glucosamine Sulfate-MSM (GLUCOSAMINE-MSM DS PO) Take 1 tablet by mouth daily.    Marland Kitchen ibuprofen (ADVIL,MOTRIN) 200 MG tablet Take 400 mg by mouth daily as needed for moderate pain.     Marland Kitchen lisinopril (ZESTRIL) 2.5 MG tablet TAKE 1 TABLET DAILY DOSE   DECREASE 90 tablet 3  . Melatonin 5 MG CAPS Take 5 mg by mouth at bedtime as needed (sleep).    . Multiple Vitamin (MULTIVITAMIN WITH MINERALS) TABS Take 1 tablet by mouth daily.    . nitroGLYCERIN (NITRODUR - DOSED IN MG/24 HR) 0.2 mg/hr patch APPLY 1 PATCH ONTO THE SKINDAILY 90 patch 2  . nitroGLYCERIN (NITROSTAT) 0.4 MG SL tablet Place 1 tablet (0.4 mg total) under the tongue every 5 (five) minutes as needed for chest pain ((MAX 3 DOSES)). 25 tablet 3  . rosuvastatin (CRESTOR) 5 MG tablet Take 1 tablet (5 mg total) by mouth every other day. 45 tablet 3  . sotalol (BETAPACE) 80 MG tablet Take 1 tablet (80 mg total) by mouth 2 (two) times daily. 180 tablet 3   No current facility-administered medications for this visit.    Allergies  Allergen Reactions  . Epinephrine Other (See Comments)    Elevated heartbeat  . Losartan Palpitations and Other (See Comments)    Irregular heartbeat  . Metoprolol Other (See Comments)    Irregular heartbeat  . Mydriacyl [Tropicamide] Other (See Comments)    Elevated heartbeat  . Phenylephrine Hcl Other (See Comments)    Increased heartrate  . Bisoprolol Other (See Comments)    Feels it does not agree with him  . Clopidogrel Bisulfate Hives       . Iodine Hives    "Renografin"  . Statins Other (See Comments)    Muscle cramps (crestor low dose is ok)    Review of Systems negative except from HPI and PMH  Physical Exam BP 138/70   Pulse (!) 53   Ht 5\' 9"  (1.753 m)   Wt 162 lb 9.6 oz (73.8 kg)   SpO2 98%   BMI 24.01 kg/m  Well developed and well nourished in no acute distress HENT  normal Neck supple with JVP-flat Clear Device pocket well healed; without hematoma or erythema.  There is no tethering  Regular rate and rhythm, no  murmur Abd-soft with active BS No Clubbing cyanosis  edema Skin-warm and dry A & Oriented  Grossly normal sensory and motor function  ECG sinus with P synchronous pacing Assessment and  Plan  Aborted cardiac arrest   Ischemic Cardiomyopathy  Ventricular tachycardia  High Risk Medication Surveillance Sotalol  Sinus node dysfunction first-degree AV block  Implantable defibrillator The patient's device was interrogated and the information was fully reviewed.  The device was reprogrammed to lengthen the AV delays both paced and sensed from 200--300 to allow for intrinsic conduction and try to reduce a 35% ventricular pacing       No intercurrent Ventricular tachycardia  Continue sotalol.  Need to check magnesium as sotalol surveillance.  Without symptoms of ischemia

## 2020-02-17 NOTE — Patient Instructions (Signed)
Medication Instructions:  Your physician recommends that you continue on your current medications as directed. Please refer to the Current Medication list given to you today.  Labwork: You will have labs drawn today: Magnesium  Testing/Procedures: None ordered.  Follow-Up: Your physician wants you to follow-up in: 12 months with Dr Caryl Comes. You will receive a reminder letter in the mail two months in advance. If you don't receive a letter, please call our office to schedule the follow-up appointment.  Remote monitoring is used to monitor your Pacemaker of ICD from home. This monitoring reduces the number of office visits required to check your device to one time per year. It allows [place] to keep an eye on the functioning of your device to ensure it is working properly.   Any Other Special Instructions Will Be Listed Below (If Applicable).  If you need a refill on your cardiac medications before your next appointment, please call your pharmacy.

## 2020-02-18 LAB — MAGNESIUM: Magnesium: 2.3 mg/dL (ref 1.6–2.3)

## 2020-02-21 ENCOUNTER — Telehealth: Payer: Self-pay

## 2020-02-21 NOTE — Telephone Encounter (Signed)
Spoke with patient regarding results.  Patient verbalizes understanding. Advised patient to call back with any issues or concerns.  

## 2020-02-21 NOTE — Telephone Encounter (Signed)
-----   Message from Steven C Klein, MD sent at 02/20/2020  6:25 PM EST ----- Please Inform Patient that labs are normal  Thanks  

## 2020-02-21 NOTE — Telephone Encounter (Signed)
-----   Message from Joel C Klein, MD sent at 02/20/2020  6:25 PM EST ----- Please Inform Patient that labs are normal  Thanks  

## 2020-02-21 NOTE — Telephone Encounter (Signed)
Left message on patients voicemail to please return our call.   

## 2020-03-11 DIAGNOSIS — I255 Ischemic cardiomyopathy: Secondary | ICD-10-CM | POA: Diagnosis not present

## 2020-03-11 DIAGNOSIS — I2510 Atherosclerotic heart disease of native coronary artery without angina pectoris: Secondary | ICD-10-CM | POA: Diagnosis not present

## 2020-03-11 DIAGNOSIS — Z1389 Encounter for screening for other disorder: Secondary | ICD-10-CM | POA: Diagnosis not present

## 2020-03-11 DIAGNOSIS — G479 Sleep disorder, unspecified: Secondary | ICD-10-CM | POA: Diagnosis not present

## 2020-03-11 DIAGNOSIS — I714 Abdominal aortic aneurysm, without rupture: Secondary | ICD-10-CM | POA: Diagnosis not present

## 2020-03-11 DIAGNOSIS — I10 Essential (primary) hypertension: Secondary | ICD-10-CM | POA: Diagnosis not present

## 2020-03-11 DIAGNOSIS — Z0000 Encounter for general adult medical examination without abnormal findings: Secondary | ICD-10-CM | POA: Diagnosis not present

## 2020-03-11 DIAGNOSIS — E7800 Pure hypercholesterolemia, unspecified: Secondary | ICD-10-CM | POA: Diagnosis not present

## 2020-04-16 DIAGNOSIS — H00011 Hordeolum externum right upper eyelid: Secondary | ICD-10-CM | POA: Diagnosis not present

## 2020-05-12 ENCOUNTER — Ambulatory Visit: Payer: Medicare HMO | Admitting: *Deleted

## 2020-05-12 DIAGNOSIS — I469 Cardiac arrest, cause unspecified: Secondary | ICD-10-CM

## 2020-05-12 LAB — CUP PACEART REMOTE DEVICE CHECK
Battery Remaining Longevity: 64 mo
Battery Remaining Percentage: 66 %
Battery Voltage: 2.95 V
Brady Statistic AP VP Percent: 7.8 %
Brady Statistic AP VS Percent: 43 %
Brady Statistic AS VP Percent: 1.7 %
Brady Statistic AS VS Percent: 47 %
Brady Statistic RA Percent Paced: 49 %
Brady Statistic RV Percent Paced: 9.4 %
Date Time Interrogation Session: 20210601033113
HighPow Impedance: 46 Ohm
HighPow Impedance: 46 Ohm
Implantable Lead Implant Date: 20090427
Implantable Lead Implant Date: 20180305
Implantable Lead Location: 753859
Implantable Lead Location: 753860
Implantable Lead Model: 7121
Implantable Pulse Generator Implant Date: 20180305
Lead Channel Impedance Value: 290 Ohm
Lead Channel Impedance Value: 400 Ohm
Lead Channel Pacing Threshold Amplitude: 0.5 V
Lead Channel Pacing Threshold Amplitude: 1.375 V
Lead Channel Pacing Threshold Pulse Width: 0.5 ms
Lead Channel Pacing Threshold Pulse Width: 0.5 ms
Lead Channel Sensing Intrinsic Amplitude: 4 mV
Lead Channel Sensing Intrinsic Amplitude: 9.1 mV
Lead Channel Setting Pacing Amplitude: 1.5 V
Lead Channel Setting Pacing Amplitude: 1.625
Lead Channel Setting Pacing Pulse Width: 0.5 ms
Lead Channel Setting Sensing Sensitivity: 0.5 mV
Pulse Gen Serial Number: 1247854

## 2020-05-13 NOTE — Progress Notes (Signed)
Remote ICD transmission.   

## 2020-06-26 DIAGNOSIS — R972 Elevated prostate specific antigen [PSA]: Secondary | ICD-10-CM | POA: Diagnosis not present

## 2020-07-02 DIAGNOSIS — R102 Pelvic and perineal pain: Secondary | ICD-10-CM | POA: Diagnosis not present

## 2020-07-02 DIAGNOSIS — R972 Elevated prostate specific antigen [PSA]: Secondary | ICD-10-CM | POA: Diagnosis not present

## 2020-07-02 DIAGNOSIS — N401 Enlarged prostate with lower urinary tract symptoms: Secondary | ICD-10-CM | POA: Diagnosis not present

## 2020-07-02 DIAGNOSIS — R351 Nocturia: Secondary | ICD-10-CM | POA: Diagnosis not present

## 2020-07-06 ENCOUNTER — Other Ambulatory Visit: Payer: Self-pay | Admitting: Interventional Cardiology

## 2020-07-15 LAB — CUP PACEART INCLINIC DEVICE CHECK
Date Time Interrogation Session: 20210308171329
Implantable Lead Implant Date: 20090427
Implantable Lead Implant Date: 20180305
Implantable Lead Location: 753859
Implantable Lead Location: 753860
Implantable Lead Model: 7121
Implantable Pulse Generator Implant Date: 20180305
Lead Channel Pacing Threshold Amplitude: 0.5 V
Lead Channel Pacing Threshold Amplitude: 1.125 V
Lead Channel Pacing Threshold Pulse Width: 0.5 ms
Lead Channel Pacing Threshold Pulse Width: 0.5 ms
Lead Channel Sensing Intrinsic Amplitude: 10.1 mV
Lead Channel Sensing Intrinsic Amplitude: 4.2 mV
Pulse Gen Serial Number: 1247854

## 2020-07-29 DIAGNOSIS — R1031 Right lower quadrant pain: Secondary | ICD-10-CM | POA: Diagnosis not present

## 2020-08-03 DIAGNOSIS — R69 Illness, unspecified: Secondary | ICD-10-CM | POA: Diagnosis not present

## 2020-08-06 DIAGNOSIS — H04123 Dry eye syndrome of bilateral lacrimal glands: Secondary | ICD-10-CM | POA: Diagnosis not present

## 2020-08-06 DIAGNOSIS — H43813 Vitreous degeneration, bilateral: Secondary | ICD-10-CM | POA: Diagnosis not present

## 2020-08-06 DIAGNOSIS — H25813 Combined forms of age-related cataract, bilateral: Secondary | ICD-10-CM | POA: Diagnosis not present

## 2020-08-06 DIAGNOSIS — H524 Presbyopia: Secondary | ICD-10-CM | POA: Diagnosis not present

## 2020-08-11 ENCOUNTER — Ambulatory Visit (INDEPENDENT_AMBULATORY_CARE_PROVIDER_SITE_OTHER): Payer: Medicare HMO | Admitting: *Deleted

## 2020-08-11 DIAGNOSIS — Z8674 Personal history of sudden cardiac arrest: Secondary | ICD-10-CM

## 2020-08-11 LAB — CUP PACEART REMOTE DEVICE CHECK
Battery Remaining Longevity: 62 mo
Battery Remaining Percentage: 64 %
Battery Voltage: 2.93 V
Brady Statistic AP VP Percent: 7.5 %
Brady Statistic AP VS Percent: 45 %
Brady Statistic AS VP Percent: 1.3 %
Brady Statistic AS VS Percent: 46 %
Brady Statistic RA Percent Paced: 50 %
Brady Statistic RV Percent Paced: 8.9 %
Date Time Interrogation Session: 20210831020015
HighPow Impedance: 46 Ohm
HighPow Impedance: 46 Ohm
Implantable Lead Implant Date: 20090427
Implantable Lead Implant Date: 20180305
Implantable Lead Location: 753859
Implantable Lead Location: 753860
Implantable Lead Model: 7121
Implantable Pulse Generator Implant Date: 20180305
Lead Channel Impedance Value: 310 Ohm
Lead Channel Impedance Value: 410 Ohm
Lead Channel Pacing Threshold Amplitude: 0.5 V
Lead Channel Pacing Threshold Amplitude: 1.625 V
Lead Channel Pacing Threshold Pulse Width: 0.5 ms
Lead Channel Pacing Threshold Pulse Width: 0.5 ms
Lead Channel Sensing Intrinsic Amplitude: 3.4 mV
Lead Channel Sensing Intrinsic Amplitude: 9.3 mV
Lead Channel Setting Pacing Amplitude: 1.5 V
Lead Channel Setting Pacing Amplitude: 1.875
Lead Channel Setting Pacing Pulse Width: 0.5 ms
Lead Channel Setting Sensing Sensitivity: 0.5 mV
Pulse Gen Serial Number: 1247854

## 2020-08-12 NOTE — Progress Notes (Signed)
Remote ICD transmission.   

## 2020-08-18 ENCOUNTER — Ambulatory Visit: Payer: Medicare HMO | Attending: Internal Medicine

## 2020-08-18 DIAGNOSIS — Z23 Encounter for immunization: Secondary | ICD-10-CM

## 2020-08-18 NOTE — Progress Notes (Signed)
   Covid-19 Vaccination Clinic  Name:  [name]    MRN: [id number] DOB: 14-Jan-1941  08/18/2020  Mr. Deloney was observed post Covid-19 immunization for 15 minutes without incident. He was provided with Vaccine Information Sheet and instruction to access the V-Safe system. Vaccine administered by Kennieth Rad, [employer] pharmacy student.  Mr. Volante was instructed to call 911 with any severe reactions post vaccine: Marland Kitchen Difficulty breathing  . Swelling of face and throat  . A fast heartbeat  . A bad rash all over body  . Dizziness and weakness

## 2020-09-25 ENCOUNTER — Other Ambulatory Visit: Payer: Self-pay | Admitting: Interventional Cardiology

## 2020-10-13 DIAGNOSIS — I10 Essential (primary) hypertension: Secondary | ICD-10-CM | POA: Diagnosis not present

## 2020-10-13 DIAGNOSIS — I255 Ischemic cardiomyopathy: Secondary | ICD-10-CM | POA: Diagnosis not present

## 2020-10-13 DIAGNOSIS — E7800 Pure hypercholesterolemia, unspecified: Secondary | ICD-10-CM | POA: Diagnosis not present

## 2020-10-13 DIAGNOSIS — I714 Abdominal aortic aneurysm, without rupture: Secondary | ICD-10-CM | POA: Diagnosis not present

## 2020-10-13 DIAGNOSIS — R21 Rash and other nonspecific skin eruption: Secondary | ICD-10-CM | POA: Diagnosis not present

## 2020-10-13 DIAGNOSIS — Z23 Encounter for immunization: Secondary | ICD-10-CM | POA: Diagnosis not present

## 2020-10-18 NOTE — Progress Notes (Signed)
Cardiology Office Note   Date:  10/19/2020   ID:  KYLAR Lin, DOB 1941-01-16, MRN [id number]  PCP:  Joel Carol, MD    No chief complaint on file.  CAD  Wt Readings from Last 3 Encounters:  10/19/20 162 lb 12.8 oz (73.8 kg)  02/17/20 162 lb 9.6 oz (73.8 kg)  02/13/20 162 lb (73.5 kg)       History of Present Illness: Joel Lin is a 79 y.o. male  whohas a history of known ischemic heart disease. He has a history of a remote myocardial infarction at the age of 29. That was a large posterolateral myocardial infarction in 1981 in [place]. He moved to [place] in 1985. The patient had angioplasty and stent of his LAD in 1999- Dr. Acie Fredrickson. In 2009 he had an out of hospital cardiac arrest at home was successfully resuscitated and treated with hypothermia without significant neurologic sequelae. No CAD by cath at that time. He now has a defibrillator in place and is followed by Dr. Caryl Comes. He has a history of hypercholesterolemia. His last nuclear stress test was 10/24/11 and showed an ejection fraction of 45% and a large posterolateral scar but no ischemia. His last echocardiogram was in 2014 and showed an ejection fraction of 40%. On 12/06/13 the patient underwent a stent graft for his abdominal aortic aneurysm. The patient did well and was discharged without complications. The patient has his defibrillator monitored through Dr. Olin Pia office. In January 2013 he had 2 episodes of ventricular tachycardia which were asymptomatic. In March 2014 he was rehospitalized after having 3 consecutive shocks from his defibrillator. He underwent cardiac catheterization by Dr. Burt Knack on 02/28/13 who told him that his arteries have not shown any progression of atherosclerosis since his previous cardiac catheterization in 2009.   On 02/10/15 the patient presented to [facility] ED due to ICD shock. He stated he was playing golf and just finished 9 holes and was starting on the back 9 when he  suddenly felt dizzy. The next thing he knows his device shocked him. He called 911 and went back to the clubhouse where EMS arrived and gave him four aspirin.  Pt was transferred to [facility] for EP eval. He did well overnight without any ICD shocks. Troponin was negative. He was seen by Dr. Caryl Comes and found stable for discharge. He had been placed on po amiodarone but Dr. Caryl Comes held on initiating amiodarone given relative infrequency of V tach.  AAA repair in 12/14, stent graft with Dr. Donnetta Hutching.  He was hospitalizatedfollowing a ICD discharge was on 06/24/15. During that admission his atenolol was stopped and he was switched to sotalol initially 80 mg twice a day and subsequently increased to 120 mg twice a day. More recently the dose was decreased again to 80 mg twice a day. He has had no further discharges from his defibrillator. The patient reports that he was able to get his heart rate up to 114.  He had an AICD changeout in 3/18 with addition of an atrial lead.  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  Step-mom passed away from [place] in [place].  Since the last visit, he has had some tingling on his sides that lasts seconds, only at the time he wakes up.  He exercises and stays active,and has no sx.   Denies : Chest pain. Dizziness. Leg edema. Nitroglycerin use. Orthopnea. Palpitations. Paroxysmal nocturnal dyspnea. Shortness of breath. Syncope.   Has some joint pains.  He stopped his brand name Zetia due to cost.  He stayed on his Crestor QOD- dose limited by muscle pain.  He has tried several statins in the past including Baychol.  Failed Zocor/Lipitor.      Past Medical History:  Diagnosis Date  . AAA (abdominal aortic aneurysm) ([place])   . Cardiac arrest ([place]) 03/29/2008   a. resuscitated OOH VF arrest  b. s/p STJ ICD  . Contrast media allergy   . Coronary artery disease    myoview 2011>>EF of 53% / large area of old inferolateral infarct but no reversible  ischemia          . Hemorrhoid   . Hypercoagulable state ([place])    Questionable history of a borderline hypercoagulable state  . Hyperlipidemia   . Myocardial infarction ([place]) 1981  . Peripheral vascular disease ([place])   . PVCs (premature ventricular contractions)    occasionally  . Ventricular tachycardia ([place])    a. appropriate ICD therpay b. on Sotalol    Past Surgical History:  Procedure Laterality Date  . ABDOMINAL AORTIC ENDOVASCULAR STENT GRAFT N/A 12/06/2013   Procedure: ABDOMINAL AORTIC ENDOVASCULAR STENT GRAFT;  Surgeon: Rosetta Posner, MD;  Location: [place];  Service: Vascular;  Laterality: N/A;  . CARDIAC CATHETERIZATION  04/04/2008    LAD stent patent, D1 OK, OM1 90/80/80% (17mm vessel), RCA 30%; EF 35%.  Marland Kitchen CARDIAC DEFIBRILLATOR PLACEMENT  04/07/2008    STJ single chamber ICD implanted for secondary prevention  . CHOLECYSTECTOMY    . CHOLECYSTECTOMY, LAPAROSCOPIC  08/13/2009   Calculous cholecystitis  . CORONARY ANGIOPLASTY WITH STENT PLACEMENT  1999   PTCA and stenting of his left anterior descending artery  . ICD REVISION N/A 02/13/2017   Procedure: Upgrade to Dual Chamber ICD;  Surgeon: Will Meredith Leeds, MD;  Location: [place] CV LAB;  Service: Cardiovascular;  Laterality: N/A;  . LEFT HEART CATHETERIZATION WITH CORONARY ANGIOGRAM N/A 02/28/2013   Procedure: LEFT HEART CATHETERIZATION WITH CORONARY ANGIOGRAM;  Surgeon: Sherren Mocha, MD;  [facility] CATH LAB;  Service: Cardiovascular;  Laterality: N/A;  . TONSILLECTOMY       Current Outpatient Medications  Medication Sig Dispense Refill  . aspirin 81 MG tablet Take 81 mg by mouth every evening.     . cholecalciferol (VITAMIN D) 1000 units tablet Take 1,000 Units by mouth daily.    . fexofenadine (ALLEGRA) 180 MG tablet Take 180 mg by mouth daily.    . fish oil-omega-3 fatty acids 1000 MG capsule Take 1 g by mouth daily.     . fluticasone (FLONASE) 50 MCG/ACT nasal spray Place 1 spray into both nostrils daily  as needed for allergies or rhinitis.    . Glucosamine Sulfate-MSM (GLUCOSAMINE-MSM DS PO) Take 1 tablet by mouth daily.    Marland Kitchen ibuprofen (ADVIL,MOTRIN) 200 MG tablet Take 400 mg by mouth daily as needed for moderate pain.     Marland Kitchen lisinopril (ZESTRIL) 2.5 MG tablet TAKE 1 TABLET DAILY (DOSE  DECREASE) 90 tablet 3  . Melatonin 5 MG CAPS Take 5 mg by mouth at bedtime as needed (sleep).    . Multiple Vitamin (MULTIVITAMIN WITH MINERALS) TABS Take 1 tablet by mouth daily.    . nitroGLYCERIN (NITRODUR - DOSED IN MG/24 HR) 0.2 mg/hr patch APPLY 1 PATCH ONTO THE SKINDAILY 90 patch 2  . nitroGLYCERIN (NITROSTAT) 0.4 MG SL tablet Place 1 tablet (0.4 mg total) under the tongue every 5 (five) minutes as needed for chest pain ((MAX 3 DOSES)). 25  tablet 3  . rosuvastatin (CRESTOR) 5 MG tablet Take 1 tablet (5 mg total) by mouth every other day. 45 tablet 3  . sotalol (BETAPACE) 80 MG tablet Take 1 tablet (80 mg total) by mouth 2 (two) times daily. 180 tablet 3  . ezetimibe (ZETIA) 10 MG tablet Take 1 tablet (10 mg total) by mouth daily. (Patient not taking: Reported on 10/19/2020) 90 tablet 3   No current facility-administered medications for this visit.    Allergies:   Epinephrine, Losartan, Metoprolol, Mydriacyl [tropicamide], Phenylephrine hcl, Bisoprolol, Clopidogrel bisulfate, Iodine, and Statins    Social History:  The patient  reports that he quit smoking about 40 years ago. His smoking use included cigarettes. He has a 20.00 pack-year smoking history. He has never used smokeless tobacco. He reports that he does not drink alcohol and does not use drugs.   Family History:  The patient's family history includes Heart disease in his father and mother; Hyperlipidemia in his father and mother; Mitral valve prolapse in his sister; Stomach cancer in an other family member.    ROS:  Please see the history of present illness.   Otherwise, review of systems are positive for joint pains.   All other systems are  reviewed and negative.    PHYSICAL EXAM: VS:  BP 122/64   Pulse (!) 55   Ht 5\' 9"  (1.753 m)   Wt 162 lb 12.8 oz (73.8 kg)   SpO2 97%   BMI 24.04 kg/m  , BMI Body mass index is 24.04 kg/m. GEN: Well nourished, well developed, in no acute distress  HEENT: normal  Neck: no JVD, carotid bruits, or masses Cardiac: RRR; no murmurs, rubs, or gallops,no edema  Respiratory:  clear to auscultation bilaterally, normal work of breathing GI: soft, nontender, nondistended, + BS MS: no deformity or atrophy  Skin: warm and dry, no rash Neuro:  Strength and sensation are intact Psych: euthymic mood, full affect   EKG:   The ekg ordered today demonstrates V pacing   Recent Labs: 02/17/2020: Magnesium 2.3   Lipid Panel    Component Value Date/Time   CHOL 127 05/04/2018 0902   TRIG 71 05/04/2018 0902   HDL 33 (L) 05/04/2018 0902   CHOLHDL 3.8 05/04/2018 0902   CHOLHDL 3.8 05/27/2016 0939   VLDL 33 (H) 05/27/2016 0939   LDLCALC 80 05/04/2018 0902   LDLDIRECT 80.0 08/12/2015 0806     Other studies Reviewed: Additional studies/ records that were reviewed today with results demonstrating: labs reviewed.   ASSESSMENT AND PLAN:  1. CAD/Old MI: No angina. Continue aggressive secondary prevention.  2. Chronic systolic heart failure: Appears euvolemic.  3. AAA: s/p EVAR.  Followed by Dr. Donnetta Hutching.    4. Hyperlipidemia: LDL 104 on QOD Crestor.  Cannot increase due to myalgias.  Zetia , welchol is expensive.  Will see if he is a candidate for PCSK9 inhibitor.  5. AICD: No shocks.    Current medicines are reviewed at length with the patient today.  The patient concerns regarding his medicines were addressed.  The following changes have been made:  No change  Labs/ tests ordered today include:  No orders of the defined types were placed in this encounter.   Recommend 150 minutes/week of aerobic exercise Low fat, low carb, high fiber diet recommended  Disposition:   FU in     Signed, Larae Grooms, MD  10/19/2020 3:17 PM    [facility] [place], [place]  [address] Phone: [phone]; Fax: [phone]

## 2020-10-19 ENCOUNTER — Other Ambulatory Visit: Payer: Self-pay

## 2020-10-19 ENCOUNTER — Ambulatory Visit: Payer: Medicare HMO | Admitting: Interventional Cardiology

## 2020-10-19 ENCOUNTER — Encounter: Payer: Self-pay | Admitting: Interventional Cardiology

## 2020-10-19 VITALS — BP 122/64 | HR 55 | Ht 69.0 in | Wt 162.8 lb

## 2020-10-19 DIAGNOSIS — I495 Sick sinus syndrome: Secondary | ICD-10-CM | POA: Diagnosis not present

## 2020-10-19 DIAGNOSIS — I472 Ventricular tachycardia, unspecified: Secondary | ICD-10-CM

## 2020-10-19 DIAGNOSIS — I25118 Atherosclerotic heart disease of native coronary artery with other forms of angina pectoris: Secondary | ICD-10-CM

## 2020-10-19 DIAGNOSIS — I469 Cardiac arrest, cause unspecified: Secondary | ICD-10-CM | POA: Diagnosis not present

## 2020-10-19 DIAGNOSIS — I5022 Chronic systolic (congestive) heart failure: Secondary | ICD-10-CM

## 2020-10-19 DIAGNOSIS — I252 Old myocardial infarction: Secondary | ICD-10-CM

## 2020-10-19 MED ORDER — SOTALOL HCL 80 MG PO TABS: 80.0000 mg | ORAL_TABLET | Freq: Two times a day (BID) | ORAL | 3 refills | Status: DC

## 2020-10-19 MED ORDER — ROSUVASTATIN CALCIUM 5 MG PO TABS: 5.0000 mg | ORAL_TABLET | ORAL | 3 refills | Status: DC

## 2020-10-19 NOTE — Patient Instructions (Signed)
Medication Instructions:  Your physician recommends that you continue on your current medications as directed. Please refer to the Current Medication list given to you today.  *If you need a refill on your cardiac medications before your next appointment, please call your pharmacy*   Lab Work: None   If you have labs (blood work) drawn today and your tests are completely normal, you will receive your results only by: Marland Kitchen MyChart Message (if you have MyChart) OR . A paper copy in the mail If you have any lab test that is abnormal or we need to change your treatment, we will call you to review the results.   Testing/Procedures: None  Follow-Up: At [facility], you and your health needs are our priority.  As part of our continuing mission to provide you with exceptional heart care, we have created designated Provider Care Teams.  These Care Teams include your primary Cardiologist (physician) and Advanced Practice Providers (APPs -  Physician Assistants and Nurse Practitioners) who all work together to provide you with the care you need, when you need it.  We recommend signing up for the patient portal called "MyChart".  Sign up information is provided on this After Visit Summary.  MyChart is used to connect with patients for Virtual Visits (Telemedicine).  Patients are able to view lab/test results, encounter notes, upcoming appointments, etc.  Non-urgent messages can be sent to your provider as well.   To learn more about what you can do with MyChart, go to [email or web address].    Your next appointment:   10 month(s)  The format for your next appointment:   In Person  Provider:   You may see Casandra Doffing, MD or one of the following Advanced Practice Providers on your designated Care Team:    Melina Copa, PA-C  Ermalinda Barrios, PA-C    Other Instructions None

## 2020-10-27 NOTE — Progress Notes (Signed)
Patient ID: Joel Lin                 DOB: [birth year]                    MRN: [id number]     HPI: Joel Lin is a 79 y.o. male patient referred to lipid clinic by Dr. Irish Lack. 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    Patient had remote MI in 40 while living in [place], intervention unknown. Patient reported he quit smoking at that time. In 1999, patient had angioplasty and stent of LAD. He had a cardiac arrest event at home in 2009 and was successfully resuscitated and treated with hypothermia, no CAD on cath at that time. Patient underwent stent graft for abdominal aortic aneurysm without complication. Patient hospitalized multiple times for ICD shocks. Patient has tried multiple statins including cerivastatin, simvastatin, atorvastatin and now on rosuvastatin 10 mg QOD, dose limited by myalgias. Patient also tried Welchol and brand name Zetia in the past which have been expensive for him.   Patient presents today with wife in good spirits. He reported tolerating rosuvastatin 10 mg QOD well, he had leg cramps with daily dosing. He has not been taking ezetimibe for about 4 months due to cost. Ezetimibe was $449.60 for 3 month refill on [employer] after meeting deductible. Patient reported good results and toleration with Welchol in the past, LDL down to 75, and cost for generic in 2022 would be $140 for 3 months. Patient and wife would like to explore the most effective option considering cardiac history. Patient and wife have made lifestyle modifications with [employer] resulting in a 25-30 pound weight loss in the past 3 years. Praluent is $47 per month or $141 for 3 months on patient insurance.    Current Medications: rosuvastatin 5 mg QOD, fish oil-omega 3 fatty acids  Intolerances: ezetimibe 10 mg daily (cost) rosuvastatin daily (myalgia), Welchol 625 mg BID (cost), niacin 1000 mg daily,  cerivastatin, simvastatin, atorvastatin (myalgias) Risk Factors: premature ASCVD, progressive ASCVD  LDL goal: <55 mg/dL  Diet: Weight watchers, lost 25-30 pounds in last 3 years  Eats little red meat, eats a lot of chicken, fruits, veggies  Exercise: Resistance exercises from PT 4x per week for 45 minutes to an hour, some walking. Was walking 1.5 miles last year.   Family History: The patient's family history includes heart disease in his father and mother; hyperlipidemia in his father and mother; Mitral valve prolapse in his sister; Stomach cancer in an other family member.  Social History: The patient reports that he quit smoking about 40 years ago. His smoking use included cigarettes. He has a 20.00 pack-year smoking history. He has never used smokeless tobacco. He reports that he does not drink alcohol and does not use drugs.   Labs:  10/13/20: TC 160 HDL 35 LDL 104 Non-HDL 125 TG 118 on rosuvastatin 10 mg QOD  Past Medical History:  Diagnosis Date  . AAA (abdominal aortic aneurysm) ([place])   . Cardiac arrest ([place]) 03/29/2008   a. resuscitated OOH VF arrest  b. s/p STJ ICD  . Contrast media allergy   . Coronary artery disease    myoview 2011>>EF of 53% / large area of old inferolateral infarct but no reversible ischemia          . Hemorrhoid   . Hypercoagulable state ([place])  Questionable history of a borderline hypercoagulable state  . Hyperlipidemia   . Myocardial infarction ([place]) 1981  . Peripheral vascular disease ([place])   . PVCs (premature ventricular contractions)    occasionally  . Ventricular tachycardia ([place])    a. appropriate ICD therpay b. on Sotalol    Current Outpatient Medications on File Prior to Visit  Medication Sig Dispense Refill  . aspirin 81 MG tablet Take 81 mg by mouth every evening.     . cholecalciferol (VITAMIN D) 1000 units tablet Take 1,000 Units by mouth daily.    Marland Kitchen ezetimibe (ZETIA) 10 MG tablet Take 1 tablet (10 mg total) by mouth daily. (Patient  not taking: Reported on 10/19/2020) 90 tablet 3  . fexofenadine (ALLEGRA) 180 MG tablet Take 180 mg by mouth daily.    . fish oil-omega-3 fatty acids 1000 MG capsule Take 1 g by mouth daily.     . fluticasone (FLONASE) 50 MCG/ACT nasal spray Place 1 spray into both nostrils daily as needed for allergies or rhinitis.    . Glucosamine Sulfate-MSM (GLUCOSAMINE-MSM DS PO) Take 1 tablet by mouth daily.    Marland Kitchen ibuprofen (ADVIL,MOTRIN) 200 MG tablet Take 400 mg by mouth daily as needed for moderate pain.     Marland Kitchen lisinopril (ZESTRIL) 2.5 MG tablet TAKE 1 TABLET DAILY (DOSE  DECREASE) 90 tablet 3  . Melatonin 5 MG CAPS Take 5 mg by mouth at bedtime as needed (sleep).    . Multiple Vitamin (MULTIVITAMIN WITH MINERALS) TABS Take 1 tablet by mouth daily.    . nitroGLYCERIN (NITRODUR - DOSED IN MG/24 HR) 0.2 mg/hr patch APPLY 1 PATCH ONTO THE SKINDAILY 90 patch 2  . nitroGLYCERIN (NITROSTAT) 0.4 MG SL tablet Place 1 tablet (0.4 mg total) under the tongue every 5 (five) minutes as needed for chest pain ((MAX 3 DOSES)). 25 tablet 3  . rosuvastatin (CRESTOR) 5 MG tablet Take 1 tablet (5 mg total) by mouth every other day. 45 tablet 3  . sotalol (BETAPACE) 80 MG tablet Take 1 tablet (80 mg total) by mouth 2 (two) times daily. 180 tablet 3   No current facility-administered medications on file prior to visit.    Allergies  Allergen Reactions  . Epinephrine Other (See Comments)    Elevated heartbeat  . Losartan Palpitations and Other (See Comments)    Irregular heartbeat  . Metoprolol Other (See Comments)    Irregular heartbeat  . Mydriacyl [Tropicamide] Other (See Comments)    Elevated heartbeat  . Phenylephrine Hcl Other (See Comments)    Increased heartrate  . Bisoprolol Other (See Comments)    Feels it does not agree with him  . Clopidogrel Bisulfate Hives       . Iodine Hives    "Renografin"  . Statins Other (See Comments)    Muscle cramps (crestor low dose is ok)    Assessment/Plan:  1.  Hyperlipidemia - Patient presents today with LDL above goal of <55 mg/dL. Patient and wife interested in the most effective lipid lowering therapy considering cardiac history. Patient expressed concerns about cost, $141 for 3 months is affordable for him. Patient has tried multiple statins and is limited by myalgias. Considering patient cardiac history suggesting goal LDL of <55 mg/dL per AACE guidelines, desire for most effective option in LDL lowering, cost, and patient on maximally tolerated statin, initiate Praluent 75 mg SQ once every two weeks. Reviewed injection technique, side effects, storage, and disposal. Discussed walking more again in addition to resistance exercises. Encouraged  patient to continue eating lean meats and lots of fruits and veggies. Submitted PA to plan.   Repeat lipid panel in 2-3 months after starting Praluent to evaluate efficacy.   Thank you,  Joel Lin  PharmD Candidate, Class of 2022  Karren Cobble, PharmD, BCACP, [place] [address], [place], [place] [address] Phone: [phone]; Fax: [phone] 10/28/2020 11:10 AM

## 2020-10-28 ENCOUNTER — Encounter: Payer: Self-pay | Admitting: Pharmacist

## 2020-10-28 ENCOUNTER — Ambulatory Visit (INDEPENDENT_AMBULATORY_CARE_PROVIDER_SITE_OTHER): Payer: Medicare HMO | Admitting: Pharmacist

## 2020-10-28 ENCOUNTER — Other Ambulatory Visit: Payer: Self-pay

## 2020-10-28 DIAGNOSIS — I2510 Atherosclerotic heart disease of native coronary artery without angina pectoris: Secondary | ICD-10-CM | POA: Diagnosis not present

## 2020-10-28 DIAGNOSIS — E782 Mixed hyperlipidemia: Secondary | ICD-10-CM | POA: Diagnosis not present

## 2020-10-28 MED ORDER — PRALUENT 75 MG/ML ~~LOC~~ SOAJ: 1.0000 mL | SUBCUTANEOUS | 1 refills | Status: AC

## 2020-10-28 NOTE — Patient Instructions (Signed)
It was a pleasure seeing you today!   Start using Praluent injection once every two weeks. Clean area with alcohol swab or inject right after a shower. Take off the cap and press the yellow part into the skin until you cannot see it anymore. Press the button on the end of the pen and lift your thumb. Hold pressure on your skin until the second click and until the viewfinder is completely yellow. Rotate sites to decrease redness. We will work with the insurance to get this covered.   We will make an appointment to check your labs in 2-3 months and make sure the medication is working well  Continue to take rosuvastatin every other day.   Continue to eat lean meats, lots of fruits and veggies.   Try to take walks starting at 10 minutes a day. Continue resistance exercises.

## 2020-10-29 ENCOUNTER — Telehealth: Payer: Self-pay

## 2020-10-29 NOTE — Telephone Encounter (Signed)
PA approved for Praluent through 12/11/21. Pharmacy did not have supply in stock and could not give price at this time. Called patient to inform him PA was approved and pharmacy should have ready tomorrow afternoon or evening, left message.

## 2020-11-10 ENCOUNTER — Ambulatory Visit (INDEPENDENT_AMBULATORY_CARE_PROVIDER_SITE_OTHER): Payer: Medicare HMO

## 2020-11-10 DIAGNOSIS — Z8674 Personal history of sudden cardiac arrest: Secondary | ICD-10-CM

## 2020-11-10 LAB — CUP PACEART REMOTE DEVICE CHECK
Battery Remaining Longevity: 59 mo
Battery Remaining Percentage: 61 %
Battery Voltage: 2.93 V
Brady Statistic AP VP Percent: 9.4 %
Brady Statistic AP VS Percent: 45 %
Brady Statistic AS VP Percent: 1.9 %
Brady Statistic AS VS Percent: 43 %
Brady Statistic RA Percent Paced: 51 %
Brady Statistic RV Percent Paced: 11 %
Date Time Interrogation Session: 20211130020019
HighPow Impedance: 44 Ohm
HighPow Impedance: 44 Ohm
Implantable Lead Implant Date: 20090427
Implantable Lead Implant Date: 20180305
Implantable Lead Location: 753859
Implantable Lead Location: 753860
Implantable Lead Model: 7121
Implantable Pulse Generator Implant Date: 20180305
Lead Channel Impedance Value: 290 Ohm
Lead Channel Impedance Value: 400 Ohm
Lead Channel Pacing Threshold Amplitude: 0.5 V
Lead Channel Pacing Threshold Amplitude: 1.5 V
Lead Channel Pacing Threshold Pulse Width: 0.5 ms
Lead Channel Pacing Threshold Pulse Width: 0.5 ms
Lead Channel Sensing Intrinsic Amplitude: 3.5 mV
Lead Channel Sensing Intrinsic Amplitude: 9.3 mV
Lead Channel Setting Pacing Amplitude: 1.5 V
Lead Channel Setting Pacing Amplitude: 1.75 V
Lead Channel Setting Pacing Pulse Width: 0.5 ms
Lead Channel Setting Sensing Sensitivity: 0.5 mV
Pulse Gen Serial Number: 1247854

## 2020-11-16 NOTE — Progress Notes (Signed)
Remote ICD transmission.   

## 2021-01-18 DIAGNOSIS — Z20822 Contact with and (suspected) exposure to covid-19: Secondary | ICD-10-CM | POA: Diagnosis not present

## 2021-02-09 ENCOUNTER — Ambulatory Visit (INDEPENDENT_AMBULATORY_CARE_PROVIDER_SITE_OTHER): Payer: Medicare HMO

## 2021-02-09 DIAGNOSIS — I255 Ischemic cardiomyopathy: Secondary | ICD-10-CM

## 2021-02-09 LAB — CUP PACEART REMOTE DEVICE CHECK
Battery Remaining Longevity: 57 mo
Battery Remaining Percentage: 59 %
Battery Voltage: 2.93 V
Brady Statistic AP VP Percent: 9.3 %
Brady Statistic AP VS Percent: 46 %
Brady Statistic AS VP Percent: 1.9 %
Brady Statistic AS VS Percent: 42 %
Brady Statistic RA Percent Paced: 52 %
Brady Statistic RV Percent Paced: 11 %
Date Time Interrogation Session: 20220301020026
HighPow Impedance: 47 Ohm
HighPow Impedance: 47 Ohm
Implantable Lead Implant Date: 20090427
Implantable Lead Implant Date: 20180305
Implantable Lead Location: 753859
Implantable Lead Location: 753860
Implantable Lead Model: 7121
Implantable Pulse Generator Implant Date: 20180305
Lead Channel Impedance Value: 300 Ohm
Lead Channel Impedance Value: 400 Ohm
Lead Channel Pacing Threshold Amplitude: 0.5 V
Lead Channel Pacing Threshold Amplitude: 1.5 V
Lead Channel Pacing Threshold Pulse Width: 0.5 ms
Lead Channel Pacing Threshold Pulse Width: 0.5 ms
Lead Channel Sensing Intrinsic Amplitude: 3.6 mV
Lead Channel Sensing Intrinsic Amplitude: 9.8 mV
Lead Channel Setting Pacing Amplitude: 1.5 V
Lead Channel Setting Pacing Amplitude: 1.75 V
Lead Channel Setting Pacing Pulse Width: 0.5 ms
Lead Channel Setting Sensing Sensitivity: 0.5 mV
Pulse Gen Serial Number: 1247854

## 2021-02-10 DIAGNOSIS — L578 Other skin changes due to chronic exposure to nonionizing radiation: Secondary | ICD-10-CM | POA: Diagnosis not present

## 2021-02-10 DIAGNOSIS — L570 Actinic keratosis: Secondary | ICD-10-CM | POA: Diagnosis not present

## 2021-02-10 DIAGNOSIS — L814 Other melanin hyperpigmentation: Secondary | ICD-10-CM | POA: Diagnosis not present

## 2021-02-10 DIAGNOSIS — D225 Melanocytic nevi of trunk: Secondary | ICD-10-CM | POA: Diagnosis not present

## 2021-02-10 DIAGNOSIS — L821 Other seborrheic keratosis: Secondary | ICD-10-CM | POA: Diagnosis not present

## 2021-02-17 NOTE — Progress Notes (Signed)
Remote ICD transmission.   

## 2021-03-01 DIAGNOSIS — H6121 Impacted cerumen, right ear: Secondary | ICD-10-CM | POA: Diagnosis not present

## 2021-03-16 ENCOUNTER — Other Ambulatory Visit: Payer: Self-pay

## 2021-03-16 ENCOUNTER — Encounter: Payer: Self-pay | Admitting: Internal Medicine

## 2021-03-16 ENCOUNTER — Ambulatory Visit: Payer: Medicare HMO | Admitting: Internal Medicine

## 2021-03-16 VITALS — BP 130/70 | HR 57 | Ht 69.0 in | Wt 160.0 lb

## 2021-03-16 DIAGNOSIS — I472 Ventricular tachycardia, unspecified: Secondary | ICD-10-CM

## 2021-03-16 DIAGNOSIS — I495 Sick sinus syndrome: Secondary | ICD-10-CM

## 2021-03-16 DIAGNOSIS — I255 Ischemic cardiomyopathy: Secondary | ICD-10-CM

## 2021-03-16 DIAGNOSIS — I4901 Ventricular fibrillation: Secondary | ICD-10-CM

## 2021-03-16 DIAGNOSIS — Z95810 Presence of automatic (implantable) cardiac defibrillator: Secondary | ICD-10-CM

## 2021-03-16 DIAGNOSIS — Z79899 Other long term (current) drug therapy: Secondary | ICD-10-CM | POA: Diagnosis not present

## 2021-03-16 MED ORDER — SPIRONOLACTONE 25 MG PO TABS: 12.5000 mg | ORAL_TABLET | Freq: Every day | ORAL | 3 refills | Status: DC

## 2021-03-16 NOTE — Patient Instructions (Addendum)
Medication Instructions:  Your physician has recommended you make the following change in your medication:   Begin Spironolactone 25mg  (1/2 tablet - 12.5mg ) by mouth every morning.   *If you need a refill on your cardiac medications before your next appointment, please call your pharmacy*   Lab Work: BMET and Mg today If you have labs (blood work) drawn today and your tests are completely normal, you will receive your results only by: Marland Kitchen MyChart Message (if you have MyChart) OR . A paper copy in the mail If you have any lab test that is abnormal or we need to change your treatment, we will call you to review the results.   Testing/Procedures: None ordered.    Follow-Up: At [facility], you and your health needs are our priority.  As part of our continuing mission to provide you with exceptional heart care, we have created designated Provider Care Teams.  These Care Teams include your primary Cardiologist (physician) and Advanced Practice Providers (APPs -  Physician Assistants and Nurse Practitioners) who all work together to provide you with the care you need, when you need it.  We recommend signing up for the patient portal called "MyChart".  Sign up information is provided on this After Visit Summary.  MyChart is used to connect with patients for Virtual Visits (Telemedicine).  Patients are able to view lab/test results, encounter notes, upcoming appointments, etc.  Non-urgent messages can be sent to your provider as well.   To learn more about what you can do with MyChart, go to [email or web address].    Your next appointment:   6 month(s)  The format for your next appointment:   In Person  Provider:  Dr Caryl Comes  Repeat BMET- lab 3 weeks after you the Spironolactone.  Please call [phone] to schedule lab appointment.

## 2021-03-16 NOTE — Addendum Note (Signed)
Addended by: Thora Lance on: 03/16/2021 06:20 PM   Modules accepted: Orders

## 2021-03-16 NOTE — Progress Notes (Signed)
Patient Care Team: [name], MD as PCP - General (Internal Medicine) Deboraha Sprang, MD as Attending Physician (Cardiology) Jettie Booze, MD as Consulting Physician (Cardiology)   HPI  Joel Lin is a 80 y.o. male is seen in followup for aborted sudden cardiac death in the setting of ischemic heart disease with  prior PCI and mild depression of LV systolic function. He is status post ICD implantation 2/18  generator change with DDD upgrade secondary to symptomatic sinus bradycardia.  Appropriate therapy 7/16 with ventricular tachycardia failed termination with ATP and for which he was shocked. A second episode of monomorphic ventricular tachycardia terminated on its own. He was started on sotalol   No interval arrhythmias.  The patient denies chest pain, shortness of breath, nocturnal dyspnea, orthopnea or peripheral edema.  There have been no palpitations, lightheadedness or syncope.        DATE TEST EF   11/12    myoview    45 % Old inferolateral   1/14    echo   40 %   2/18 Echo  40-45%         Date Cr K Mg  6/17  0.95 4.6  *  2/18  1.01 4.4 2.4  9/19 0.87 4.2   3/20   2.3  9/20 0.92 4.5            Appropriate Therapy yes -- VT ATP  Inappropriate Therapy yes-- sinus tachycardia Antiarrhythmics Date  sotalol              Past Medical History:  Diagnosis Date  . AAA (abdominal aortic aneurysm) ([place])   . Cardiac arrest ([place]) 03/29/2008   a. resuscitated OOH VF arrest  b. s/p STJ ICD  . Contrast media allergy   . Coronary artery disease    myoview 2011>>EF of 53% / large area of old inferolateral infarct but no reversible ischemia          . Hemorrhoid   . Hypercoagulable state ([place])    Questionable history of a borderline hypercoagulable state  . Hyperlipidemia   . Myocardial infarction ([place]) 1981  . Peripheral vascular disease ([place])   . PVCs (premature ventricular contractions)    occasionally  . Ventricular tachycardia  ([place])    a. appropriate ICD therpay b. on Sotalol    Past Surgical History:  Procedure Laterality Date  . ABDOMINAL AORTIC ENDOVASCULAR STENT GRAFT N/A 12/06/2013   Procedure: ABDOMINAL AORTIC ENDOVASCULAR STENT GRAFT;  Surgeon: Rosetta Posner, MD;  Location: [facility];  Service: Vascular;  Laterality: N/A;  . CARDIAC CATHETERIZATION  04/04/2008    LAD stent patent, D1 OK, OM1 90/80/80% (93mm vessel), RCA 30%; EF 35%.  Marland Kitchen CARDIAC DEFIBRILLATOR PLACEMENT  04/07/2008    STJ single chamber ICD implanted for secondary prevention  . CHOLECYSTECTOMY    . CHOLECYSTECTOMY, LAPAROSCOPIC  08/13/2009   Calculous cholecystitis  . CORONARY ANGIOPLASTY WITH STENT PLACEMENT  1999   PTCA and stenting of his left anterior descending artery  . ICD REVISION N/A 02/13/2017   Procedure: Upgrade to Dual Chamber ICD;  Surgeon: Will Meredith Leeds, MD;  Location: [facility];  Service: Cardiovascular;  Laterality: N/A;  . LEFT HEART CATHETERIZATION WITH CORONARY ANGIOGRAM N/A 02/28/2013   Procedure: LEFT HEART CATHETERIZATION WITH CORONARY ANGIOGRAM;  Surgeon: Sherren Mocha, MD;  Location[facility] C[facility];  Service: Cardiovascular;  Laterality: N/A;  . TONSILLECTOMY      Current Outpatient Medications  Medication  Sig Dispense Refill  . Alirocumab (PRALUENT) 75 MG/ML SOAJ Inject 75 mg/mL into the skin every 14 (fourteen) days.    Marland Kitchen aspirin 81 MG tablet Take 81 mg by mouth every evening.    . cholecalciferol (VITAMIN D) 1000 units tablet Take 1,000 Units by mouth daily.    . fexofenadine (ALLEGRA) 180 MG tablet Take 180 mg by mouth daily.    . fish oil-omega-3 fatty acids 1000 MG capsule Take 1 g by mouth daily.     . fluticasone (FLONASE) 50 MCG/ACT nasal spray Place 1 spray into both nostrils daily as needed for allergies or rhinitis.    . Glucosamine Sulfate-MSM (GLUCOSAMINE-MSM DS PO) Take 1 tablet by mouth daily.    Marland Kitchen ibuprofen (ADVIL,MOTRIN) 200 MG tablet Take 400 mg by mouth daily as needed for moderate pain.      Marland Kitchen lisinopril (ZESTRIL) 2.5 MG tablet TAKE 1 TABLET DAILY (DOSE  DECREASE) 90 tablet 3  . Melatonin 5 MG CAPS Take 5 mg by mouth at bedtime as needed (sleep).    . Multiple Vitamin (MULTIVITAMIN WITH MINERALS) TABS Take 1 tablet by mouth daily.    . nitroGLYCERIN (NITRODUR - DOSED IN MG/24 HR) 0.2 mg/hr patch APPLY 1 PATCH ONTO THE SKINDAILY 90 patch 2  . nitroGLYCERIN (NITROSTAT) 0.4 MG SL tablet Place 1 tablet (0.4 mg total) under the tongue every 5 (five) minutes as needed for chest pain ((MAX 3 DOSES)). 25 tablet 3  . rosuvastatin (CRESTOR) 5 MG tablet Take 1 tablet (5 mg total) by mouth every other day. 45 tablet 3  . sotalol (BETAPACE) 80 MG tablet Take 1 tablet (80 mg total) by mouth 2 (two) times daily. 180 tablet 3   No current facility-administered medications for this visit.    Allergies  Allergen Reactions  . Epinephrine Other (See Comments)    Elevated heartbeat  . Losartan Palpitations and Other (See Comments)    Irregular heartbeat  . Metoprolol Other (See Comments)    Irregular heartbeat  . Mydriacyl [Tropicamide] Other (See Comments)    Elevated heartbeat  . Phenylephrine Hcl Other (See Comments)    Increased heartrate  . Bisoprolol Other (See Comments)    Feels it does not agree with him  . Clopidogrel Bisulfate Hives       . Iodine Hives    "Renografin"  . Statins Other (See Comments)    Muscle cramps (crestor low dose is ok)    Review of Systems negative except from HPI and PMH  Physical Exam BP 130/70 (BP Location: Left Arm, Patient Position: Sitting, Cuff Size: Normal)   Pulse (!) 57   Ht 5\' 9"  (1.753 m)   Wt 160 lb (72.6 kg)   SpO2 97%   BMI 23.63 kg/m  Well developed and well nourished in no acute distress HENT normal Neck supple with JVP-flat Clear Device pocket well healed; without hematoma or erythema.  There is no tethering  Regular rate and rhythm, no murmur Abd-soft with active BS No Clubbing cyanosis  edema Skin-warm and dry A &  Oriented  Grossly normal sensory and motor function  ECG sinus @ 72 32/12/44   Assessment and  Plan  Aborted cardiac arrest   Ischemic Cardiomyopathy  Ventricular tachycardia  High Risk Medication Surveillance Sotalol  Sinus node dysfunction first-degree AV block  Implantable defibrillator  No intercurrent Ventricular tachycardia  Without symptoms of ischemia  Tolerating sotalol  Needs amiodarone surveillance laboratories.  Will reach out to Dr. Saundra Shelling as to whether  we can think about aldosterone antagonist for his modest cardiomyopathy; the absence of symptoms of dyspnea, it is hard to put him into a HFpEF group to justify the use of an SGLT2  He has responded and has suggested we try spironolactone.  We will begin at 12.5 mg a day and will need a metabolic profile in about 3 weeks.

## 2021-03-17 ENCOUNTER — Other Ambulatory Visit: Payer: Self-pay | Admitting: Interventional Cardiology

## 2021-03-17 LAB — MAGNESIUM: Magnesium: 2.4 mg/dL — ABNORMAL HIGH (ref 1.6–2.3)

## 2021-03-17 LAB — BASIC METABOLIC PANEL
BUN/Creatinine Ratio: 24 (ref 10–24)
BUN: 21 mg/dL (ref 8–27)
CO2: 25 mmol/L (ref 20–29)
Calcium: 9.1 mg/dL (ref 8.6–10.2)
Chloride: 104 mmol/L (ref 96–106)
Creatinine, Ser: 0.88 mg/dL (ref 0.76–1.27)
Glucose: 82 mg/dL (ref 65–99)
Potassium: 4.7 mmol/L (ref 3.5–5.2)
Sodium: 145 mmol/L — ABNORMAL HIGH (ref 134–144)
eGFR: 88 mL/min/{1.73_m2} (ref >59)

## 2021-03-22 ENCOUNTER — Telehealth: Payer: Self-pay | Admitting: Internal Medicine

## 2021-03-22 DIAGNOSIS — Z79899 Other long term (current) drug therapy: Secondary | ICD-10-CM

## 2021-03-22 DIAGNOSIS — E782 Mixed hyperlipidemia: Secondary | ICD-10-CM

## 2021-03-22 NOTE — Telephone Encounter (Signed)
New message:      Pt said when he saw Dr Caryl Comes last week he prescribed a new medicine. Pt says he will start taking the new medicine today.  He said the nurse told him to call and let her know when he started taking the new medicine, so she could order lab work.

## 2021-03-23 NOTE — Addendum Note (Signed)
Addended by: Thora Lance on: 03/23/2021 07:20 PM   Modules accepted: Orders

## 2021-03-23 NOTE — Telephone Encounter (Addendum)
Spoke with pt who states he started Spironolactone yesterday.  Appointment scheduled for 04/14/2021 for BMET.  Pt states he also needs Lipid panel as he has started Pralulent.  Spoke with Pharm D who recommends pt have Lipid panel and LFT's.  Order's placed. Appt scheduled for 03/24/2021. Pt verbalizes understanding and agrees with current plan.

## 2021-03-24 ENCOUNTER — Other Ambulatory Visit: Payer: Medicare HMO | Admitting: *Deleted

## 2021-03-24 ENCOUNTER — Other Ambulatory Visit: Payer: Self-pay

## 2021-03-24 DIAGNOSIS — Z79899 Other long term (current) drug therapy: Secondary | ICD-10-CM | POA: Diagnosis not present

## 2021-03-24 DIAGNOSIS — E782 Mixed hyperlipidemia: Secondary | ICD-10-CM

## 2021-03-24 LAB — HEPATIC FUNCTION PANEL
ALT: 27 IU/L (ref 0–44)
AST: 34 IU/L (ref 0–40)
Albumin: 4.2 g/dL (ref 3.7–4.7)
Alkaline Phosphatase: 71 IU/L (ref 44–121)
Bilirubin Total: 0.6 mg/dL (ref 0.0–1.2)
Bilirubin, Direct: 0.16 mg/dL (ref 0.00–0.40)
Total Protein: 6.8 g/dL (ref 6.0–8.5)

## 2021-03-24 LAB — LIPID PANEL
Chol/HDL Ratio: 2.5 ratio (ref 0.0–5.0)
Cholesterol, Total: 103 mg/dL (ref 100–199)
HDL: 42 mg/dL (ref >39)
LDL Chol Calc (NIH): 45 mg/dL (ref 0–99)
Triglycerides: 76 mg/dL (ref 0–149)
VLDL Cholesterol Cal: 16 mg/dL (ref 5–40)

## 2021-03-25 ENCOUNTER — Other Ambulatory Visit: Payer: Self-pay | Admitting: Interventional Cardiology

## 2021-03-26 MED ORDER — NITROGLYCERIN 0.2 MG/HR TD PT24: MEDICATED_PATCH | TRANSDERMAL | 2 refills | Status: DC

## 2021-03-26 NOTE — Addendum Note (Signed)
Addended by: Carter Kitten D on: 03/26/2021 10:13 AM   Modules accepted: Orders

## 2021-04-02 ENCOUNTER — Other Ambulatory Visit: Payer: Self-pay | Admitting: Interventional Cardiology

## 2021-04-02 DIAGNOSIS — I2510 Atherosclerotic heart disease of native coronary artery without angina pectoris: Secondary | ICD-10-CM

## 2021-04-02 DIAGNOSIS — E782 Mixed hyperlipidemia: Secondary | ICD-10-CM

## 2021-04-05 ENCOUNTER — Telehealth: Payer: Self-pay | Admitting: Internal Medicine

## 2021-04-05 NOTE — Telephone Encounter (Signed)
New Message:    Pt says he would like for you to go over his lab results and he also would like a hard copy sent to him pleased

## 2021-04-06 NOTE — Telephone Encounter (Signed)
Spoke with pt and advised Dr Caryl Comes is out of the office for the next 2 weeks and he has not yet reviewed pt's lab results.  Pt requesting a hard copy be mailed to him.  Will forward to Dr Irish Lack and request review as pt states this lab was due to pt starting Praluent prescribed by Dr Irish Lack.

## 2021-04-07 NOTE — Telephone Encounter (Signed)
Spoke with pt 04/06/2021 and advised per Dr Irish Lack lipids and liver function labs are within normal limits.  Pt verbalizes understanding and hardcopy of labs placed in mail.

## 2021-04-14 ENCOUNTER — Other Ambulatory Visit: Payer: Medicare HMO | Admitting: *Deleted

## 2021-04-14 ENCOUNTER — Other Ambulatory Visit: Payer: Self-pay

## 2021-04-14 ENCOUNTER — Other Ambulatory Visit: Payer: Medicare HMO

## 2021-04-14 DIAGNOSIS — I495 Sick sinus syndrome: Secondary | ICD-10-CM | POA: Diagnosis not present

## 2021-04-14 DIAGNOSIS — Z79899 Other long term (current) drug therapy: Secondary | ICD-10-CM | POA: Diagnosis not present

## 2021-04-14 DIAGNOSIS — I472 Ventricular tachycardia, unspecified: Secondary | ICD-10-CM

## 2021-04-14 DIAGNOSIS — I255 Ischemic cardiomyopathy: Secondary | ICD-10-CM

## 2021-04-14 DIAGNOSIS — I4901 Ventricular fibrillation: Secondary | ICD-10-CM | POA: Diagnosis not present

## 2021-04-14 LAB — BASIC METABOLIC PANEL
BUN/Creatinine Ratio: 24 (ref 10–24)
BUN: 22 mg/dL (ref 8–27)
CO2: 25 mmol/L (ref 20–29)
Calcium: 8.9 mg/dL (ref 8.6–10.2)
Chloride: 100 mmol/L (ref 96–106)
Creatinine, Ser: 0.92 mg/dL (ref 0.76–1.27)
Glucose: 89 mg/dL (ref 65–99)
Potassium: 4.4 mmol/L (ref 3.5–5.2)
Sodium: 138 mmol/L (ref 134–144)
eGFR: 85 mL/min/{1.73_m2} (ref >59)

## 2021-04-21 DIAGNOSIS — Z95810 Presence of automatic (implantable) cardiac defibrillator: Secondary | ICD-10-CM | POA: Diagnosis not present

## 2021-04-21 DIAGNOSIS — I2510 Atherosclerotic heart disease of native coronary artery without angina pectoris: Secondary | ICD-10-CM | POA: Diagnosis not present

## 2021-04-21 DIAGNOSIS — I10 Essential (primary) hypertension: Secondary | ICD-10-CM | POA: Diagnosis not present

## 2021-04-21 DIAGNOSIS — Z0000 Encounter for general adult medical examination without abnormal findings: Secondary | ICD-10-CM | POA: Diagnosis not present

## 2021-04-21 DIAGNOSIS — I472 Ventricular tachycardia: Secondary | ICD-10-CM | POA: Diagnosis not present

## 2021-04-21 DIAGNOSIS — I714 Abdominal aortic aneurysm, without rupture: Secondary | ICD-10-CM | POA: Diagnosis not present

## 2021-04-21 DIAGNOSIS — E7800 Pure hypercholesterolemia, unspecified: Secondary | ICD-10-CM | POA: Diagnosis not present

## 2021-04-21 DIAGNOSIS — I255 Ischemic cardiomyopathy: Secondary | ICD-10-CM | POA: Diagnosis not present

## 2021-04-21 DIAGNOSIS — Z1389 Encounter for screening for other disorder: Secondary | ICD-10-CM | POA: Diagnosis not present

## 2021-04-27 ENCOUNTER — Telehealth: Payer: Self-pay

## 2021-04-27 NOTE — Telephone Encounter (Signed)
Attempted phone call to pt.  Per Epic ok to leave voicemail message.  Voicemail left and advised pt per Dr Caryl Comes his labs are normal.  May call [phone] for any further questions.

## 2021-04-27 NOTE — Telephone Encounter (Signed)
-----   Message from Deboraha Sprang, MD sent at 04/23/2021  3:04 PM EDT ----- Please Inform Patient that labs are normal  Thanks

## 2021-05-11 ENCOUNTER — Ambulatory Visit (INDEPENDENT_AMBULATORY_CARE_PROVIDER_SITE_OTHER): Payer: Medicare HMO

## 2021-05-11 DIAGNOSIS — I255 Ischemic cardiomyopathy: Secondary | ICD-10-CM

## 2021-05-12 LAB — CUP PACEART REMOTE DEVICE CHECK
Battery Remaining Longevity: 55 mo
Battery Remaining Percentage: 56 %
Battery Voltage: 2.92 V
Brady Statistic AP VP Percent: 13 %
Brady Statistic AP VS Percent: 42 %
Brady Statistic AS VP Percent: 2.7 %
Brady Statistic AS VS Percent: 41 %
Brady Statistic RA Percent Paced: 53 %
Brady Statistic RV Percent Paced: 16 %
Date Time Interrogation Session: 20220531020022
HighPow Impedance: 48 Ohm
HighPow Impedance: 48 Ohm
Implantable Lead Implant Date: 20090427
Implantable Lead Implant Date: 20180305
Implantable Lead Location: 753859
Implantable Lead Location: 753860
Implantable Lead Model: 7121
Implantable Pulse Generator Implant Date: 20180305
Lead Channel Impedance Value: 310 Ohm
Lead Channel Impedance Value: 400 Ohm
Lead Channel Pacing Threshold Amplitude: 0.375 V
Lead Channel Pacing Threshold Amplitude: 1.375 V
Lead Channel Pacing Threshold Pulse Width: 0.5 ms
Lead Channel Pacing Threshold Pulse Width: 0.5 ms
Lead Channel Sensing Intrinsic Amplitude: 3.5 mV
Lead Channel Sensing Intrinsic Amplitude: 9.4 mV
Lead Channel Setting Pacing Amplitude: 1.375
Lead Channel Setting Pacing Amplitude: 1.625
Lead Channel Setting Pacing Pulse Width: 0.5 ms
Lead Channel Setting Sensing Sensitivity: 0.5 mV
Pulse Gen Serial Number: 1247854

## 2021-06-02 NOTE — Progress Notes (Signed)
Remote ICD transmission.   

## 2021-06-23 DIAGNOSIS — R972 Elevated prostate specific antigen [PSA]: Secondary | ICD-10-CM | POA: Diagnosis not present

## 2021-06-30 DIAGNOSIS — R351 Nocturia: Secondary | ICD-10-CM | POA: Diagnosis not present

## 2021-06-30 DIAGNOSIS — N401 Enlarged prostate with lower urinary tract symptoms: Secondary | ICD-10-CM | POA: Diagnosis not present

## 2021-06-30 DIAGNOSIS — R972 Elevated prostate specific antigen [PSA]: Secondary | ICD-10-CM | POA: Diagnosis not present

## 2021-06-30 DIAGNOSIS — R102 Pelvic and perineal pain: Secondary | ICD-10-CM | POA: Diagnosis not present

## 2021-08-10 ENCOUNTER — Ambulatory Visit (INDEPENDENT_AMBULATORY_CARE_PROVIDER_SITE_OTHER): Payer: Medicare HMO

## 2021-08-10 DIAGNOSIS — I255 Ischemic cardiomyopathy: Secondary | ICD-10-CM | POA: Diagnosis not present

## 2021-08-10 LAB — CUP PACEART REMOTE DEVICE CHECK
Battery Remaining Longevity: 52 mo
Battery Remaining Percentage: 54 %
Battery Voltage: 2.92 V
Brady Statistic AP VP Percent: 15 %
Brady Statistic AP VS Percent: 39 %
Brady Statistic AS VP Percent: 3.5 %
Brady Statistic AS VS Percent: 42 %
Brady Statistic RA Percent Paced: 51 %
Brady Statistic RV Percent Paced: 18 %
Date Time Interrogation Session: 20220830020016
HighPow Impedance: 45 Ohm
HighPow Impedance: 45 Ohm
Implantable Lead Implant Date: 20090427
Implantable Lead Implant Date: 20180305
Implantable Lead Location: 753859
Implantable Lead Location: 753860
Implantable Lead Model: 7121
Implantable Pulse Generator Implant Date: 20180305
Lead Channel Impedance Value: 290 Ohm
Lead Channel Impedance Value: 410 Ohm
Lead Channel Pacing Threshold Amplitude: 0.375 V
Lead Channel Pacing Threshold Amplitude: 1.75 V
Lead Channel Pacing Threshold Pulse Width: 0.5 ms
Lead Channel Pacing Threshold Pulse Width: 0.5 ms
Lead Channel Sensing Intrinsic Amplitude: 3.8 mV
Lead Channel Sensing Intrinsic Amplitude: 9.4 mV
Lead Channel Setting Pacing Amplitude: 1.375
Lead Channel Setting Pacing Amplitude: 2 V
Lead Channel Setting Pacing Pulse Width: 0.5 ms
Lead Channel Setting Sensing Sensitivity: 0.5 mV
Pulse Gen Serial Number: 1247854

## 2021-08-23 NOTE — Progress Notes (Signed)
Remote ICD transmission.   

## 2021-08-29 ENCOUNTER — Other Ambulatory Visit: Payer: Self-pay | Admitting: Interventional Cardiology

## 2021-08-29 ENCOUNTER — Other Ambulatory Visit: Payer: Self-pay | Admitting: Internal Medicine

## 2021-09-06 DIAGNOSIS — H2513 Age-related nuclear cataract, bilateral: Secondary | ICD-10-CM | POA: Diagnosis not present

## 2021-09-06 DIAGNOSIS — H5213 Myopia, bilateral: Secondary | ICD-10-CM | POA: Diagnosis not present

## 2021-09-12 DIAGNOSIS — Z0100 Encounter for examination of eyes and vision without abnormal findings: Secondary | ICD-10-CM | POA: Diagnosis not present

## 2021-10-06 ENCOUNTER — Ambulatory Visit: Payer: Medicare HMO | Admitting: Internal Medicine

## 2021-10-06 ENCOUNTER — Other Ambulatory Visit: Payer: Self-pay

## 2021-10-06 ENCOUNTER — Other Ambulatory Visit: Payer: Self-pay | Admitting: Pharmacist

## 2021-10-06 ENCOUNTER — Encounter: Payer: Self-pay | Admitting: Internal Medicine

## 2021-10-06 VITALS — BP 132/62 | HR 58 | Ht 69.0 in | Wt 164.2 lb

## 2021-10-06 DIAGNOSIS — I495 Sick sinus syndrome: Secondary | ICD-10-CM | POA: Diagnosis not present

## 2021-10-06 DIAGNOSIS — I255 Ischemic cardiomyopathy: Secondary | ICD-10-CM | POA: Diagnosis not present

## 2021-10-06 DIAGNOSIS — I4901 Ventricular fibrillation: Secondary | ICD-10-CM

## 2021-10-06 DIAGNOSIS — Z95810 Presence of automatic (implantable) cardiac defibrillator: Secondary | ICD-10-CM

## 2021-10-06 DIAGNOSIS — I472 Ventricular tachycardia, unspecified: Secondary | ICD-10-CM

## 2021-10-06 DIAGNOSIS — E782 Mixed hyperlipidemia: Secondary | ICD-10-CM

## 2021-10-06 DIAGNOSIS — I2510 Atherosclerotic heart disease of native coronary artery without angina pectoris: Secondary | ICD-10-CM

## 2021-10-06 LAB — CUP PACEART INCLINIC DEVICE CHECK
Battery Remaining Longevity: 52 mo
Brady Statistic RA Percent Paced: 48 %
Brady Statistic RV Percent Paced: 17 %
Date Time Interrogation Session: 20221026162210
HighPow Impedance: 47.8086
Implantable Lead Implant Date: 20090427
Implantable Lead Implant Date: 20180305
Implantable Lead Location: 753859
Implantable Lead Location: 753860
Implantable Lead Model: 7121
Implantable Pulse Generator Implant Date: 20180305
Lead Channel Impedance Value: 300 Ohm
Lead Channel Impedance Value: 400 Ohm
Lead Channel Pacing Threshold Amplitude: 0.5 V
Lead Channel Pacing Threshold Amplitude: 0.5 V
Lead Channel Pacing Threshold Amplitude: 1.5 V
Lead Channel Pacing Threshold Amplitude: 1.5 V
Lead Channel Pacing Threshold Pulse Width: 0.5 ms
Lead Channel Pacing Threshold Pulse Width: 0.5 ms
Lead Channel Pacing Threshold Pulse Width: 0.5 ms
Lead Channel Pacing Threshold Pulse Width: 0.5 ms
Lead Channel Sensing Intrinsic Amplitude: 3.9 mV
Lead Channel Sensing Intrinsic Amplitude: 9.2 mV
Lead Channel Setting Pacing Amplitude: 1.375
Lead Channel Setting Pacing Amplitude: 1.625
Lead Channel Setting Pacing Pulse Width: 0.5 ms
Lead Channel Setting Sensing Sensitivity: 0.5 mV
Pulse Gen Serial Number: 1247854

## 2021-10-06 MED ORDER — NITROGLYCERIN 0.4 MG SL SUBL: 0.4000 mg | SUBLINGUAL_TABLET | SUBLINGUAL | 3 refills | Status: DC | PRN

## 2021-10-06 MED ORDER — PRALUENT 75 MG/ML ~~LOC~~ SOAJ: 1.0000 "pen " | SUBCUTANEOUS | 11 refills | Status: DC

## 2021-10-06 MED ORDER — PRALUENT 75 MG/ML ~~LOC~~ SOAJ: 1.0000 "pen " | SUBCUTANEOUS | 3 refills | Status: DC

## 2021-10-06 NOTE — Patient Instructions (Signed)

## 2021-10-06 NOTE — Progress Notes (Signed)
Patient Care Team: [name], MD as PCP - General (Internal Medicine) Deboraha Sprang, MD as Attending Physician (Cardiology) Jettie Booze, MD as Consulting Physician (Cardiology)   HPI  Joel Lin is a 80 y.o. male is seen in followup for aborted sudden cardiac death in the setting of ischemic heart disease with  prior PCI and mild depression of LV systolic function. He is status post ICD implantation; 2/18  generator change with DDD upgrade secondary to symptomatic sinus bradycardia.  Appropriate therapy 7/16 with ventricular tachycardia failed termination with ATP and for which he was shocked. A second episode of monomorphic ventricular tachycardia terminated on its own. He was started on sotalol   The patient denies chest pain, shortness of breath, nocturnal dyspnea, orthopnea or peripheral edema.  There have been no palpitations, lightheadedness or syncope.    Great trip to [place]-- great food      DATE TEST EF   11/12    myoview    45 % Old inferolateral   1/14    echo   40 %   2/18 Echo  40-45%         Date Cr K Mg  6/17  0.95 4.6  *  2/18  1.01 4.4 2.4  9/19 0.87 4.2   3/20   2.3  9/20 0.92 4.5    5/22 0.92 4.4 2.4     Appropriate Therapy yes -- VT ATP  Inappropriate Therapy yes-- sinus tachycardia Antiarrhythmics Date  sotalol              1  Past Surgical History:  Procedure Laterality Date   ABDOMINAL AORTIC ENDOVASCULAR STENT GRAFT N/A 12/06/2013   Procedure: ABDOMINAL AORTIC ENDOVASCULAR STENT GRAFT;  Surgeon: Rosetta Posner, MD;  Location: [facility];  Service: Vascular;  Laterality: N/A;   CARDIAC CATHETERIZATION  04/04/2008    LAD stent patent, D1 OK, OM1 90/80/80% (28mm vessel), RCA 30%; EF 35%.   CARDIAC DEFIBRILLATOR PLACEMENT  04/07/2008    STJ single chamber ICD implanted for secondary prevention   CHOLECYSTECTOMY     CHOLECYSTECTOMY, LAPAROSCOPIC  08/13/2009   Calculous cholecystitis   CORONARY ANGIOPLASTY WITH  STENT PLACEMENT  1999   PTCA and stenting of his left anterior descending artery   ICD REVISION N/A 02/13/2017   Procedure: Upgrade to Dual Chamber ICD;  Surgeon: Will Meredith Leeds, MD;  Location: [facility];  Service: Cardiovascular;  Laterality: N/A;   LEFT HEART CATHETERIZATION WITH CORONARY ANGIOGRAM N/A 02/28/2013   Procedure: LEFT HEART CATHETERIZATION WITH CORONARY ANGIOGRAM;  Surgeon: Sherren Mocha, MD;  Location: [facility] CATH LAB;  Service: Cardiovascular;  Laterality: N/A;   TONSILLECTOMY      Current Outpatient Medications  Medication Sig Dispense Refill   aspirin 81 MG tablet Take 81 mg by mouth every evening.     cholecalciferol (VITAMIN D) 1000 units tablet Take 1,000 Units by mouth daily.     fexofenadine (ALLEGRA) 180 MG tablet Take 180 mg by mouth daily.     fish oil-omega-3 fatty acids 1000 MG capsule Take 1 g by mouth daily.      fluticasone (FLONASE) 50 MCG/ACT nasal spray Place 1 spray into both nostrils daily as needed for allergies or rhinitis.     Glucosamine Sulfate-MSM (GLUCOSAMINE-MSM DS PO) Take 1 tablet by mouth daily.     ibuprofen (ADVIL,MOTRIN) 200 MG tablet Take 400 mg by mouth daily as needed for moderate pain.  lisinopril (ZESTRIL) 2.5 MG tablet TAKE 1 TABLET DAILY (DOSE  DECREASE) 90 tablet 2   Melatonin 5 MG CAPS Take 5 mg by mouth at bedtime as needed (sleep).     Multiple Vitamin (MULTIVITAMIN WITH MINERALS) TABS Take 1 tablet by mouth daily.     nitroGLYCERIN (NITRODUR - DOSED IN MG/24 HR) 0.2 mg/hr patch APPLY 1 PATCH ONTO THE SKIN DAILY 90 patch 2   nitroGLYCERIN (NITROSTAT) 0.4 MG SL tablet Place 1 tablet (0.4 mg total) under the tongue every 5 (five) minutes as needed for chest pain ((MAX 3 DOSES)). 25 tablet 3   PRALUENT 75 MG/ML SOAJ INJECT 1 ML INTO THE SKIN EVERY 14 (FOURTEEN) DAYS. 6 mL 1   rosuvastatin (CRESTOR) 5 MG tablet TAKE 1 TABLET EVERY OTHER  DAY 45 tablet 2   sotalol (BETAPACE) 80 MG tablet TAKE 1 TABLET TWICE A DAY 180 tablet  2   spironolactone (ALDACTONE) 25 MG tablet Take 0.5 tablets (12.5 mg total) by mouth daily. 45 tablet 3   tamsulosin (FLOMAX) 0.4 MG CAPS capsule Take 0.4 mg by mouth at bedtime as needed.     No current facility-administered medications for this visit.    Allergies  Allergen Reactions   Epinephrine Other (See Comments)    Elevated heartbeat   Losartan Palpitations and Other (See Comments)    Irregular heartbeat   Metoprolol Other (See Comments)    Irregular heartbeat   Mydriacyl [Tropicamide] Other (See Comments)    Elevated heartbeat   Phenylephrine Hcl Other (See Comments)    Increased heartrate   Bisoprolol Other (See Comments)    Feels it does not agree with him   Clopidogrel Bisulfate Hives        Iodine Hives    "Renografin"   Statins Other (See Comments)    Muscle cramps (crestor low dose is ok)    Review of Systems negative except from HPI and PMH  Physical Exam BP 132/62   Pulse (!) 58   Ht 5\' 9"  (1.753 m)   Wt 164 lb 3.2 oz (74.5 kg)   SpO2 98%   BMI 24.25 kg/m  Well developed and well nourished in no acute distress HENT normal Neck supple with JVP-flat Clear Device pocket well healed; without hematoma or erythema.  There is no tethering  Regular rate and rhythm, no/ murmur Abd-soft with active BS No Clubbing cyanosis  edema Skin-warm and dry A & Oriented  Grossly normal sensory and motor function  ECG sinus @ 58 26/12/43    Assessment and  Plan  Aborted cardiac arrest   Ischemic Cardiomyopathy  Ventricular tachycardia  High Risk Medication Surveillance Sotalol  Sinus node dysfunction first-degree AV block  Implantable defibrillator   Hyperlipidemia   Without symptoms of ischemia.  Continue rosuvastatin 5 and praluent , ASA 81,   Cardiomyopathy stable, continue lisinopril 2.5, aldactone 12.5  No VT  continue sotalol 80 bid  Conduction block is stable  Device function normal

## 2021-10-06 NOTE — Addendum Note (Signed)
Addended by: Thora Lance on: 10/06/2021 02:36 PM   Modules accepted: Orders

## 2021-10-12 DIAGNOSIS — Z03818 Encounter for observation for suspected exposure to other biological agents ruled out: Secondary | ICD-10-CM | POA: Diagnosis not present

## 2021-10-12 DIAGNOSIS — J029 Acute pharyngitis, unspecified: Secondary | ICD-10-CM | POA: Diagnosis not present

## 2021-10-13 ENCOUNTER — Telehealth: Payer: Self-pay | Admitting: Internal Medicine

## 2021-10-13 NOTE — Telephone Encounter (Signed)
Pt c/o medication issue:  1. Name of Medication: Alirocumab ([place]) 75 MG/ML SOAJ  2. How are you currently taking this medication (dosage and times per day)?  AS DIRECTED  3. Are you having a reaction (difficulty breathing--STAT)? NO  4. What is your medication issue? PT IS REQUESTING PATIENT ASSISTANCE WITH THIS MEDICINE. THE COST HAS REACHED $1,500.00

## 2021-10-13 NOTE — Telephone Encounter (Signed)
Called and spoke w/pt and stated that we have packaged a sample and pt assistance form will be placed with it and the pt voiced understanding

## 2021-10-18 ENCOUNTER — Telehealth: Payer: Self-pay | Admitting: Pharmacist

## 2021-10-18 NOTE — Telephone Encounter (Signed)
Patient approved for [employer] grant. Called pt - he was very Patent attorney.  Called [facility] and left card info on VM. Pt does not need refill at this time.  CARD NO. [id number]   CARD STATUS OK   BIN [id number]   PCN [id number]   PC GROUP [id number]

## 2021-11-09 ENCOUNTER — Other Ambulatory Visit: Payer: Self-pay | Admitting: Interventional Cardiology

## 2021-11-09 ENCOUNTER — Ambulatory Visit (INDEPENDENT_AMBULATORY_CARE_PROVIDER_SITE_OTHER): Payer: Medicare HMO

## 2021-11-09 DIAGNOSIS — I255 Ischemic cardiomyopathy: Secondary | ICD-10-CM | POA: Diagnosis not present

## 2021-11-09 LAB — CUP PACEART REMOTE DEVICE CHECK
Battery Remaining Longevity: 50 mo
Battery Remaining Percentage: 52 %
Battery Voltage: 2.92 V
Brady Statistic AP VP Percent: 13 %
Brady Statistic AP VS Percent: 37 %
Brady Statistic AS VP Percent: 3.8 %
Brady Statistic AS VS Percent: 45 %
Brady Statistic RA Percent Paced: 48 %
Brady Statistic RV Percent Paced: 17 %
Date Time Interrogation Session: 20221129020016
HighPow Impedance: 46 Ohm
HighPow Impedance: 46 Ohm
Implantable Lead Implant Date: 20090427
Implantable Lead Implant Date: 20180305
Implantable Lead Location: 753859
Implantable Lead Location: 753860
Implantable Lead Model: 7121
Implantable Pulse Generator Implant Date: 20180305
Lead Channel Impedance Value: 300 Ohm
Lead Channel Impedance Value: 400 Ohm
Lead Channel Pacing Threshold Amplitude: 0.375 V
Lead Channel Pacing Threshold Amplitude: 1.5 V
Lead Channel Pacing Threshold Pulse Width: 0.5 ms
Lead Channel Pacing Threshold Pulse Width: 0.5 ms
Lead Channel Sensing Intrinsic Amplitude: 3.6 mV
Lead Channel Sensing Intrinsic Amplitude: 9.5 mV
Lead Channel Setting Pacing Amplitude: 1.375
Lead Channel Setting Pacing Amplitude: 1.75 V
Lead Channel Setting Pacing Pulse Width: 0.5 ms
Lead Channel Setting Sensing Sensitivity: 0.5 mV
Pulse Gen Serial Number: 1247854

## 2021-11-18 NOTE — Progress Notes (Signed)
Remote ICD transmission.   

## 2022-02-08 ENCOUNTER — Ambulatory Visit (INDEPENDENT_AMBULATORY_CARE_PROVIDER_SITE_OTHER): Payer: Medicare HMO

## 2022-02-08 DIAGNOSIS — I255 Ischemic cardiomyopathy: Secondary | ICD-10-CM

## 2022-02-08 LAB — CUP PACEART REMOTE DEVICE CHECK
Battery Remaining Longevity: 48 mo
Battery Remaining Percentage: 50 %
Battery Voltage: 2.9 V
Brady Statistic AP VP Percent: 14 %
Brady Statistic AP VS Percent: 37 %
Brady Statistic AS VP Percent: 4.2 %
Brady Statistic AS VS Percent: 44 %
Brady Statistic RA Percent Paced: 48 %
Brady Statistic RV Percent Paced: 18 %
Date Time Interrogation Session: 20230228020017
HighPow Impedance: 46 Ohm
HighPow Impedance: 47 Ohm
Implantable Lead Implant Date: 20090427
Implantable Lead Implant Date: 20180305
Implantable Lead Location: 753859
Implantable Lead Location: 753860
Implantable Lead Model: 7121
Implantable Pulse Generator Implant Date: 20180305
Lead Channel Impedance Value: 300 Ohm
Lead Channel Impedance Value: 400 Ohm
Lead Channel Pacing Threshold Amplitude: 0.375 V
Lead Channel Pacing Threshold Amplitude: 1.75 V
Lead Channel Pacing Threshold Pulse Width: 0.5 ms
Lead Channel Pacing Threshold Pulse Width: 0.5 ms
Lead Channel Sensing Intrinsic Amplitude: 3.6 mV
Lead Channel Sensing Intrinsic Amplitude: 8.6 mV
Lead Channel Setting Pacing Amplitude: 1.375
Lead Channel Setting Pacing Amplitude: 2 V
Lead Channel Setting Pacing Pulse Width: 0.5 ms
Lead Channel Setting Sensing Sensitivity: 0.5 mV
Pulse Gen Serial Number: 1247854

## 2022-02-10 DIAGNOSIS — M79672 Pain in left foot: Secondary | ICD-10-CM | POA: Diagnosis not present

## 2022-02-13 ENCOUNTER — Other Ambulatory Visit: Payer: Self-pay | Admitting: Internal Medicine

## 2022-02-15 DIAGNOSIS — L821 Other seborrheic keratosis: Secondary | ICD-10-CM | POA: Diagnosis not present

## 2022-02-15 DIAGNOSIS — L814 Other melanin hyperpigmentation: Secondary | ICD-10-CM | POA: Diagnosis not present

## 2022-02-15 DIAGNOSIS — D225 Melanocytic nevi of trunk: Secondary | ICD-10-CM | POA: Diagnosis not present

## 2022-02-15 DIAGNOSIS — Z23 Encounter for immunization: Secondary | ICD-10-CM | POA: Diagnosis not present

## 2022-02-15 DIAGNOSIS — L720 Epidermal cyst: Secondary | ICD-10-CM | POA: Diagnosis not present

## 2022-02-15 DIAGNOSIS — L304 Erythema intertrigo: Secondary | ICD-10-CM | POA: Diagnosis not present

## 2022-02-15 DIAGNOSIS — L578 Other skin changes due to chronic exposure to nonionizing radiation: Secondary | ICD-10-CM | POA: Diagnosis not present

## 2022-02-15 NOTE — Progress Notes (Signed)
Remote ICD transmission.   

## 2022-03-15 DIAGNOSIS — J069 Acute upper respiratory infection, unspecified: Secondary | ICD-10-CM | POA: Diagnosis not present

## 2022-03-29 DIAGNOSIS — R0981 Nasal congestion: Secondary | ICD-10-CM | POA: Diagnosis not present

## 2022-04-26 ENCOUNTER — Other Ambulatory Visit: Payer: Self-pay | Admitting: *Deleted

## 2022-04-26 MED ORDER — ROSUVASTATIN CALCIUM 5 MG PO TABS: 5.0000 mg | ORAL_TABLET | ORAL | 0 refills | Status: DC

## 2022-04-28 ENCOUNTER — Other Ambulatory Visit: Payer: Self-pay | Admitting: Interventional Cardiology

## 2022-04-28 ENCOUNTER — Other Ambulatory Visit: Payer: Self-pay | Admitting: Internal Medicine

## 2022-05-03 DIAGNOSIS — Z95810 Presence of automatic (implantable) cardiac defibrillator: Secondary | ICD-10-CM | POA: Diagnosis not present

## 2022-05-03 DIAGNOSIS — I2510 Atherosclerotic heart disease of native coronary artery without angina pectoris: Secondary | ICD-10-CM | POA: Diagnosis not present

## 2022-05-03 DIAGNOSIS — E7800 Pure hypercholesterolemia, unspecified: Secondary | ICD-10-CM | POA: Diagnosis not present

## 2022-05-03 DIAGNOSIS — I255 Ischemic cardiomyopathy: Secondary | ICD-10-CM | POA: Diagnosis not present

## 2022-05-03 DIAGNOSIS — I10 Essential (primary) hypertension: Secondary | ICD-10-CM | POA: Diagnosis not present

## 2022-05-03 DIAGNOSIS — J309 Allergic rhinitis, unspecified: Secondary | ICD-10-CM | POA: Diagnosis not present

## 2022-05-03 DIAGNOSIS — Z0000 Encounter for general adult medical examination without abnormal findings: Secondary | ICD-10-CM | POA: Diagnosis not present

## 2022-05-10 ENCOUNTER — Ambulatory Visit (INDEPENDENT_AMBULATORY_CARE_PROVIDER_SITE_OTHER): Payer: Medicare HMO

## 2022-05-10 DIAGNOSIS — I255 Ischemic cardiomyopathy: Secondary | ICD-10-CM | POA: Diagnosis not present

## 2022-05-10 LAB — CUP PACEART REMOTE DEVICE CHECK
Battery Remaining Longevity: 46 mo
Battery Remaining Percentage: 47 %
Battery Voltage: 2.9 V
Brady Statistic AP VP Percent: 15 %
Brady Statistic AP VS Percent: 36 %
Brady Statistic AS VP Percent: 4.4 %
Brady Statistic AS VS Percent: 44 %
Brady Statistic RA Percent Paced: 48 %
Brady Statistic RV Percent Paced: 19 %
Date Time Interrogation Session: 20230530020018
HighPow Impedance: 48 Ohm
HighPow Impedance: 48 Ohm
Implantable Lead Implant Date: 20090427
Implantable Lead Implant Date: 20180305
Implantable Lead Location: 753859
Implantable Lead Location: 753860
Implantable Lead Model: 7121
Implantable Pulse Generator Implant Date: 20180305
Lead Channel Impedance Value: 310 Ohm
Lead Channel Impedance Value: 430 Ohm
Lead Channel Pacing Threshold Amplitude: 0.375 V
Lead Channel Pacing Threshold Amplitude: 1.375 V
Lead Channel Pacing Threshold Pulse Width: 0.5 ms
Lead Channel Pacing Threshold Pulse Width: 0.5 ms
Lead Channel Sensing Intrinsic Amplitude: 12 mV
Lead Channel Sensing Intrinsic Amplitude: 4.2 mV
Lead Channel Setting Pacing Amplitude: 1.375
Lead Channel Setting Pacing Amplitude: 1.625
Lead Channel Setting Pacing Pulse Width: 0.5 ms
Lead Channel Setting Sensing Sensitivity: 0.5 mV
Pulse Gen Serial Number: 1247854

## 2022-05-13 ENCOUNTER — Emergency Department (HOSPITAL_BASED_OUTPATIENT_CLINIC_OR_DEPARTMENT_OTHER)
Admission: EM | Admit: 2022-05-13 | Discharge: 2022-05-13 | Disposition: A | Discharge status: Home / Self Care | Payer: Medicare HMO | Attending: Emergency Medicine | Admitting: Emergency Medicine

## 2022-05-13 ENCOUNTER — Other Ambulatory Visit: Payer: Self-pay

## 2022-05-13 ENCOUNTER — Encounter (HOSPITAL_BASED_OUTPATIENT_CLINIC_OR_DEPARTMENT_OTHER): Payer: Self-pay | Admitting: Emergency Medicine

## 2022-05-13 ENCOUNTER — Emergency Department (HOSPITAL_BASED_OUTPATIENT_CLINIC_OR_DEPARTMENT_OTHER): Payer: Medicare HMO | Admitting: Radiology

## 2022-05-13 ENCOUNTER — Emergency Department (HOSPITAL_BASED_OUTPATIENT_CLINIC_OR_DEPARTMENT_OTHER): Payer: Medicare HMO

## 2022-05-13 DIAGNOSIS — R079 Chest pain, unspecified: Secondary | ICD-10-CM | POA: Diagnosis present

## 2022-05-13 DIAGNOSIS — I2510 Atherosclerotic heart disease of native coronary artery without angina pectoris: Secondary | ICD-10-CM | POA: Diagnosis not present

## 2022-05-13 DIAGNOSIS — Z87891 Personal history of nicotine dependence: Secondary | ICD-10-CM | POA: Diagnosis not present

## 2022-05-13 DIAGNOSIS — R0789 Other chest pain: Secondary | ICD-10-CM | POA: Diagnosis not present

## 2022-05-13 DIAGNOSIS — T82897A Other specified complication of cardiac prosthetic devices, implants and grafts, initial encounter: Secondary | ICD-10-CM | POA: Diagnosis not present

## 2022-05-13 LAB — CBC
HCT: 45.3 % (ref 39.0–52.0)
Hemoglobin: 14.4 g/dL (ref 13.0–17.0)
MCH: 28 pg (ref 26.0–34.0)
MCHC: 31.8 g/dL (ref 30.0–36.0)
MCV: 88.1 fL (ref 80.0–100.0)
Platelets: 145 10*3/uL — ABNORMAL LOW (ref 150–400)
RBC: 5.14 MIL/uL (ref 4.22–5.81)
RDW: 13.5 % (ref 11.5–15.5)
WBC: 4.5 10*3/uL (ref 4.0–10.5)
nRBC: 0 % (ref 0.0–0.2)

## 2022-05-13 LAB — TROPONIN I (HIGH SENSITIVITY)
Troponin I (High Sensitivity): 14 ng/L (ref <18)
Troponin I (High Sensitivity): 13 ng/L (ref <18)

## 2022-05-13 LAB — BASIC METABOLIC PANEL
Anion gap: 9 (ref 5–15)
BUN: 31 mg/dL — ABNORMAL HIGH (ref 8–23)
CO2: 27 mmol/L (ref 22–32)
Calcium: 9.4 mg/dL (ref 8.9–10.3)
Chloride: 103 mmol/L (ref 98–111)
Creatinine, Ser: 0.99 mg/dL (ref 0.61–1.24)
GFR, Estimated: >60 mL/min (ref >60)
Glucose, Bld: 90 mg/dL (ref 70–99)
Potassium: 4.1 mmol/L (ref 3.5–5.1)
Sodium: 139 mmol/L (ref 135–145)

## 2022-05-13 LAB — MAGNESIUM: Magnesium: 2.3 mg/dL (ref 1.7–2.4)

## 2022-05-13 NOTE — ED Notes (Signed)
[employer] sending on call representative to interrogate pacemaker. Karilyn Cota [phone]

## 2022-05-13 NOTE — Discharge Instructions (Signed)
You were evaluated in the Emergency Department and after careful evaluation, we did not find any emergent condition requiring admission or further testing in the hospital.  Your exam/testing today was overall reassuring.  Your ICD is functioning well, recommend follow-up with your cardiologist.  Please return to the Emergency Department if you experience any worsening of your condition.  Thank you for allowing [place] to be a part of your care.

## 2022-05-13 NOTE — ED Notes (Signed)
Joel Lin, [employer] rep, called at this time to report data transmission from pacemaker interrogation completed -- per report patient was not shocked tonite -- Dr Sedonia Small aware -- report to be faxed by [facility]

## 2022-05-13 NOTE — ED Notes (Signed)
After hours [employer] troubleshooting hotline -- [phone]

## 2022-05-13 NOTE — ED Notes (Signed)
Pt agreeable with d/c plan as discussed by provider- this nurse has verbally reinforced d/c instructions and provided pt with written copy - pt acknowledges verbal understanding and denies any additional questions, concerns, needs- escorted to exit via w/c; pt spouse to transport pt home

## 2022-05-13 NOTE — ED Provider Notes (Signed)
[facility] Emergency Department Provider Note MRN:  [id number]  Arrival date & time: 05/13/22     Chief Complaint   AICD Problem   History of Present Illness   Joel Lin is a 81 y.o. year-old male with a history of AAA, CAD, cardiac arrest presenting to the ED with chief complaint of ICD problem.  Patient believes that he was shocked by his ICD while sleeping this evening.  Had a normal day yesterday, no missed medications.  Suddenly shocked awake shortly prior to arrival.  Having some left-sided lateral chest pain at this time intermittently, very mild.  No nausea, no other complaints.  Review of Systems  A thorough review of systems was obtained and all systems are negative except as noted in the HPI and PMH.   Patient's Health History    Past Medical History:  Diagnosis Date   AAA (abdominal aortic aneurysm) ([place])    Cardiac arrest ([place]) 03/29/2008   a. resuscitated OOH VF arrest  b. s/p STJ ICD   Contrast media allergy    Coronary artery disease    myoview 2011>>EF of 53% / large area of old inferolateral infarct but no reversible ischemia           Hemorrhoid    Hypercoagulable state ([place])    Questionable history of a borderline hypercoagulable state   Hyperlipidemia    Myocardial infarction ([place]) 1981   Peripheral vascular disease ([place])    PVCs (premature ventricular contractions)    occasionally   Ventricular tachycardia ([place])    a. appropriate ICD therpay b. on Sotalol    Past Surgical History:  Procedure Laterality Date   ABDOMINAL AORTIC ENDOVASCULAR STENT GRAFT N/A 12/06/2013   Procedure: ABDOMINAL AORTIC ENDOVASCULAR STENT GRAFT;  Surgeon: Rosetta Posner, MD;  Location: [place];  Service: Vascular;  Laterality: N/A;   CARDIAC CATHETERIZATION  04/04/2008    LAD stent patent, D1 OK, OM1 90/80/80% (49m vessel), RCA 30%; EF 35%.   CARDIAC DEFIBRILLATOR PLACEMENT  04/07/2008    STJ single chamber ICD implanted for secondary  prevention   CHOLECYSTECTOMY     CHOLECYSTECTOMY, LAPAROSCOPIC  08/13/2009   Calculous cholecystitis   CORONARY ANGIOPLASTY WITH STENT PLACEMENT  1999   PTCA and stenting of his left anterior descending artery   ICD REVISION N/A 02/13/2017   Procedure: Upgrade to Dual Chamber ICD;  Surgeon: Will MMeredith Leeds MD;  [facility];  Service: Cardiovascular;  Laterality: N/A;   LEFT HEART CATHETERIZATION WITH CORONARY ANGIOGRAM N/A 02/28/2013   Procedure: LEFT HEART CATHETERIZATION WITH CORONARY ANGIOGRAM;  Surgeon: MSherren Mocha MD;  [facility];  Service: Cardiovascular;  Laterality: N/A;   TONSILLECTOMY      Family History  Problem Relation Age of Onset   Hyperlipidemia Mother    Heart disease Mother    Heart disease Father        before age 80  Hyperlipidemia Father    Mitral valve prolapse Sister    Stomach cancer Other    Heart attack Neg Hx    Hypertension Neg Hx     Social History   Socioeconomic History   Marital status: Married    Spouse name: Not on file   Number of children: Not on file   Years of education: Not on file   Highest education level: Not on file  Occupational History   Occupation: Retired TPharmacist, hospital Tobacco Use   Smoking status: Former    Packs/day:  1.00    Years: 20.00    Pack years: 20.00    Types: Cigarettes    Quit date: 12/23/1979    Years since quitting: 42.4   Smokeless tobacco: Never  Vaping Use   Vaping Use: Never used  Substance and Sexual Activity   Alcohol use: No    Alcohol/week: 0.0 standard drinks   Drug use: No   Sexual activity: Yes  Other Topics Concern   Not on file  Social History Narrative   Not on file   Social Determinants of Health   Financial Resource Strain: Not on file  Food Insecurity: Not on file  Transportation Needs: Not on file  Physical Activity: Not on file  Stress: Not on file  Social Connections: Not on file  Intimate Partner Violence: Not on file     Physical Exam    Vitals:   05/13/22 0330 05/13/22 0400  BP: 138/66 (!) 141/64  Pulse: (!) 49 (!) 50  Resp: 14 16  Temp:    SpO2: 95% 96%    CONSTITUTIONAL: Well-appearing, NAD NEURO/PSYCH:  Alert and oriented x 3, no focal deficits EYES:  eyes equal and reactive ENT/NECK:  no LAD, no JVD CARDIO: Regular rate, well-perfused, normal S1 and S2 PULM:  CTAB no wheezing or rhonchi GI/GU:  non-distended, non-tender MSK/SPINE:  No gross deformities, no edema SKIN:  no rash, atraumatic   *Additional and/or pertinent findings included in MDM below  Diagnostic and Interventional Summary    EKG Interpretation  Date/Time:  Friday May 13 2022 00:53:23 EDT Ventricular Rate:  59 PR Interval:  312 QRS Duration: 122 QT Interval:  418 QTC Calculation: 413 R Axis:   265 Text Interpretation: Sinus bradycardia with 1st degree A-V block Lateral infarct (cited on or before 25-Jun-2015) Inferior infarct (cited on or before 25-Jun-2015) Abnormal ECG When compared with ECG of 14-Feb-2017 06:21, Significant changes have occurred Confirmed by Gerlene Fee [phone]) on 05/13/2022 1:27:05 AM       Labs Reviewed  BASIC METABOLIC PANEL - Abnormal; Notable for the following components:      Result Value   BUN 31 (*)    All other components within normal limits  CBC - Abnormal; Notable for the following components:   Platelets 145 (*)    All other components within normal limits  MAGNESIUM  TROPONIN I (HIGH SENSITIVITY)  TROPONIN I (HIGH SENSITIVITY)    DG Chest Portable 1 View  Final Result      Medications - No data to display   Procedures  /  Critical Care Procedures  ED Course and Medical Decision Making  Initial Impression and Ddx Suspect ICD shock, will interrogate the pacemaker to see what events occurred.  Patient doing quite well at this time, mild chest discomfort but minimal symptoms, EKG reassuring right now.  Will likely touch base with cardiology.  Past medical/surgical history that  increases complexity of ED encounter: History of MI, cardiac arrest, ICD placement  Interpretation of Diagnostics I personally reviewed the EKG and my interpretation is as follows: Sinus bradycardia, first-degree AV block  Labs are reassuring with no significant blood count or electrolyte disturbance.  Troponin is negative x2.  Patient Reassessment and Ultimate Disposition/Management     Patient continues to look and feel well, no symptoms at this time, no ectopy or arrhythmia on cardiac monitor for 3+ hours.  Received report from the interrogation of the ICD, functioning normally, no arrhythmia, no shock delivered.  Unclear what caused patient to have the  sensation of ICD discharge but nothing to suggest emergent process, appropriate for discharge  Patient management required discussion with the following services or consulting groups:  None  Complexity of Problems Addressed Acute illness or injury that poses threat of life of bodily function  Additional Data Reviewed and Analyzed Further history obtained from: Further history from spouse/family member  Additional Factors Impacting ED Encounter Risk Consideration of hospitalization  Kwane Rohl. Sedonia Small, MD [place] [email or web address]  Final Clinical Impressions(s) / ED Diagnoses     ICD-10-CM   1. Chest pain, unspecified type  R07.9       ED Discharge Orders     None        Discharge Instructions Discussed with and Provided to Patient:     Discharge Instructions      You were evaluated in the Emergency Department and after careful evaluation, we did not find any emergent condition requiring admission or further testing in the hospital.  Your exam/testing today was overall reassuring.  Your ICD is functioning well, recommend follow-up with your cardiologist.  Please return to the Emergency Department if you experience any worsening of your condition.  Thank you for  allowing [place] to be a part of your care.        Maudie Flakes, MD 05/13/22 [phone]

## 2022-05-13 NOTE — ED Notes (Signed)
Multiple attempts to interrogate device - all attempts unsuccessful

## 2022-05-13 NOTE — ED Triage Notes (Signed)
Pt laying in bed and felt defibrillator fire.  Denies any chest pain prior. Reports some pain on left side chest after.  Patient has hx of cardiac arrest with vfib per pt/wife Aicd is [facility]

## 2022-05-13 NOTE — ED Notes (Signed)
Late entry -- Pt arrives from triage to room via w/c; pt fully alert and oriented.  Denies active cp/sob -- states shocked by defibrillatory approx 2340hrs night of arrival; states afterwards had moderate L lateral rib region pain but this pain has resolved; states never had dizziness, nausea, weakness, sob with shock.  Only complaint s/p defib shock is HA.  Pt now on cardiac monitoring-- attempting to interrogate pacemaker and transmit data

## 2022-05-25 ENCOUNTER — Other Ambulatory Visit: Payer: Self-pay

## 2022-05-25 MED ORDER — ROSUVASTATIN CALCIUM 5 MG PO TABS: 5.0000 mg | ORAL_TABLET | ORAL | 0 refills | Status: DC

## 2022-05-26 NOTE — Progress Notes (Signed)
Remote ICD transmission.   

## 2022-05-31 ENCOUNTER — Telehealth: Payer: Self-pay | Admitting: Internal Medicine

## 2022-05-31 MED ORDER — ROSUVASTATIN CALCIUM 5 MG PO TABS: 5.0000 mg | ORAL_TABLET | ORAL | 0 refills | Status: DC

## 2022-05-31 NOTE — Telephone Encounter (Signed)
Pt's medication was sent to pt's pharmacy as requested. Confirmation received.  °

## 2022-05-31 NOTE — Telephone Encounter (Signed)
*  STAT* If patient is at the pharmacy, call can be transferred to refill team.   1. Which medications need to be refilled? (please list name of each medication and dose if known) rosuvastatin (CRESTOR) 5 MG tablet  2. Which pharmacy/location (including street and city if local pharmacy) is medication to be sent to? [place], [place]  3. Do they need a 30 day or 90 day supply? 90 day   Patient has appt on July 10th with Dr. Caryl Comes.

## 2022-06-24 ENCOUNTER — Encounter: Payer: Medicare HMO | Admitting: Internal Medicine

## 2022-06-30 DIAGNOSIS — R1030 Lower abdominal pain, unspecified: Secondary | ICD-10-CM | POA: Diagnosis not present

## 2022-07-06 ENCOUNTER — Ambulatory Visit (INDEPENDENT_AMBULATORY_CARE_PROVIDER_SITE_OTHER): Payer: Medicare HMO | Admitting: Internal Medicine

## 2022-07-06 ENCOUNTER — Encounter: Payer: Self-pay | Admitting: Internal Medicine

## 2022-07-06 VITALS — BP 112/66 | HR 53 | Ht 69.0 in | Wt 159.8 lb

## 2022-07-06 DIAGNOSIS — I472 Ventricular tachycardia, unspecified: Secondary | ICD-10-CM | POA: Diagnosis not present

## 2022-07-06 DIAGNOSIS — I255 Ischemic cardiomyopathy: Secondary | ICD-10-CM

## 2022-07-06 DIAGNOSIS — I495 Sick sinus syndrome: Secondary | ICD-10-CM

## 2022-07-06 DIAGNOSIS — Z95810 Presence of automatic (implantable) cardiac defibrillator: Secondary | ICD-10-CM

## 2022-07-06 LAB — CUP PACEART INCLINIC DEVICE CHECK
Battery Remaining Longevity: 45 mo
Brady Statistic RA Percent Paced: 49 %
Brady Statistic RV Percent Paced: 21 %
Date Time Interrogation Session: 20230726173641
HighPow Impedance: 52.2246
Implantable Lead Implant Date: 20090427
Implantable Lead Implant Date: 20180305
Implantable Lead Location: 753859
Implantable Lead Location: 753860
Implantable Lead Model: 7121
Implantable Pulse Generator Implant Date: 20180305
Lead Channel Impedance Value: 312.5 Ohm
Lead Channel Impedance Value: 437.5 Ohm
Lead Channel Pacing Threshold Amplitude: 0.5 V
Lead Channel Pacing Threshold Amplitude: 1.25 V
Lead Channel Pacing Threshold Pulse Width: 0.5 ms
Lead Channel Pacing Threshold Pulse Width: 0.5 ms
Lead Channel Sensing Intrinsic Amplitude: 10.6 mV
Lead Channel Sensing Intrinsic Amplitude: 3.9 mV
Lead Channel Setting Pacing Amplitude: 1.5 V
Lead Channel Setting Pacing Amplitude: 1.5 V
Lead Channel Setting Pacing Pulse Width: 0.5 ms
Lead Channel Setting Sensing Sensitivity: 0.5 mV
Pulse Gen Serial Number: 1247854

## 2022-07-06 NOTE — Progress Notes (Signed)
Patient Care Team: [name], MD as PCP - General (Internal Medicine) Deboraha Sprang, MD as Attending Physician (Cardiology) Jettie Booze, MD as Consulting Physician (Cardiology)   HPI  Joel Lin is a 81 y.o. male is seen in followup for aborted sudden cardiac death in the setting of ischemic heart disease with  prior PCI and mild depression of LV systolic function. He is status post ICD implantation; 2/18  generator change with DDD upgrade secondary to symptomatic sinus bradycardia.  Appropriate therapy 7/16 with ventricular tachycardia failed termination with ATP and for which he was shocked. A second episode of monomorphic ventricular tachycardia terminated on its own. 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  There have been no palpitations, lightheadedness or syncope.    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 resuscitated OOH VF arrest  b. s/p STJ ICD   Contrast media allergy    Coronary artery disease    myoview 2011>>EF of 53% / large area of old inferolateral infarct but no reversible ischemia           Hemorrhoid    Hypercoagulable state ([place])    Questionable history of a borderline hypercoagulable state   Hyperlipidemia    Myocardial infarction  ([place]) 1981   Peripheral vascular disease ([place])    PVCs (premature ventricular contractions)    occasionally   Ventricular tachycardia ([place])    a. appropriate ICD therpay b. on Sotalol      1  Past Surgical History:  Procedure Laterality Date   ABDOMINAL AORTIC ENDOVASCULAR STENT GRAFT N/A 12/06/2013   Procedure: ABDOMINAL AORTIC ENDOVASCULAR STENT GRAFT;  Surgeon: Rosetta Posner, MD;  Location: [facility];  Service: Vascular;  Laterality: N/A;   CARDIAC CATHETERIZATION  04/04/2008    LAD stent patent, D1 OK, OM1 90/80/80% (81m vessel), RCA 30%; EF 35%.   CARDIAC DEFIBRILLATOR PLACEMENT  04/07/2008    STJ single chamber ICD implanted for secondary prevention   CHOLECYSTECTOMY     CHOLECYSTECTOMY, LAPAROSCOPIC  08/13/2009   Calculous cholecystitis   CORONARY ANGIOPLASTY WITH STENT PLACEMENT  1999   PTCA and stenting of his left anterior descending artery   ICD REVISION N/A 02/13/2017   Procedure: Upgrade to Dual Chamber ICD;  Surgeon: Will MMeredith Leeds MD;  [facility];  Service: Cardiovascular;  Laterality: N/A;   LEFT HEART CATHETERIZATION WITH CORONARY ANGIOGRAM N/A 02/28/2013   Procedure: LEFT HEART CATHETERIZATION  WITH CORONARY ANGIOGRAM;  Surgeon: Sherren Mocha, MD;  Location: [facility] LAB;  Service: Cardiovascular;  Laterality: N/A;   TONSILLECTOMY      Current Outpatient Medications  Medication Sig Dispense Refill   Alirocumab (PRALUENT) 75 MG/ML SOAJ Inject 1 pen into the skin every 14 (fourteen) days. 6 mL 3   aspirin 81 MG tablet Take 81 mg by mouth every evening.     cholecalciferol (VITAMIN D) 1000 units tablet Take 1,000 Units by mouth daily.     fexofenadine (ALLEGRA) 180 MG tablet Take 180 mg by mouth daily.     fish oil-omega-3 fatty acids 1000 MG capsule Take 1 g by mouth daily.      fluticasone (FLONASE) 50 MCG/ACT nasal spray Place 1 spray into both nostrils daily as needed for allergies or rhinitis.     Glucosamine Sulfate-MSM (GLUCOSAMINE-MSM DS  PO) Take 1 tablet by mouth daily.     ibuprofen (ADVIL,MOTRIN) 200 MG tablet Take 400 mg by mouth daily as needed for moderate pain.      lisinopril (ZESTRIL) 2.5 MG tablet TAKE 1 TABLET DAILY (DOSE  DECREASE) 90 tablet 1   Melatonin 5 MG CAPS Take 5 mg by mouth at bedtime as needed (sleep).     Multiple Vitamin (MULTIVITAMIN WITH MINERALS) TABS Take 1 tablet by mouth daily.     nitroGLYCERIN (NITRODUR - DOSED IN MG/24 HR) 0.2 mg/hr patch APPLY 1 PATCH ONTO THE SKINDAILY 90 patch 3   nitroGLYCERIN (NITROSTAT) 0.4 MG SL tablet Place 1 tablet (0.4 mg total) under the tongue every 5 (five) minutes as needed for chest pain ((MAX 3 DOSES)). 25 tablet 3   rosuvastatin (CRESTOR) 5 MG tablet Take 1 tablet (5 mg total) by mouth every other day. Please keep upcoming appt with Dr. Caryl Comes in July 2023 before anymore refills. Thank you Final attempt 45 tablet 0   sotalol (BETAPACE) 80 MG tablet TAKE 1 TABLET TWICE A DAY 180 tablet 1   spironolactone (ALDACTONE) 25 MG tablet TAKE 1/2 TABLET DAILY 45 tablet 2   tamsulosin (FLOMAX) 0.4 MG CAPS capsule Take 0.4 mg by mouth at bedtime as needed.     No current facility-administered medications for this visit.    Allergies  Allergen Reactions   Epinephrine Other (See Comments)    Elevated heartbeat   Losartan Palpitations and Other (See Comments)    Irregular heartbeat   Metoprolol Other (See Comments)    Irregular heartbeat   Mydriacyl [Tropicamide] Other (See Comments)    Elevated heartbeat   Phenylephrine Hcl (Pressors) Other (See Comments)    Increased heartrate   Bisoprolol Other (See Comments)    Feels it does not agree with him   Clopidogrel Bisulfate Hives        Iodine Hives    "Renografin"   Statins Other (See Comments)    Muscle cramps (crestor low dose is ok)    Review of Systems negative except from HPI and PMH  Physical Exam BP 112/66   Pulse (!) 53   Ht '5\' 9"'$  (1.753 m)   Wt 159 lb 12.8 oz (72.5 kg)   SpO2 96%   BMI 23.60  kg/m  Well developed and well nourished in no acute distress HENT normal Neck supple with JVP-flat Clear Device pocket well healed; without hematoma or erythema.  There is no tethering  Regular rate and rhythm, no  murmur Abd-soft with active BS No Clubbing cyanosis  edema Skin-warm and dry A & Oriented  Grossly normal  sensory and motor function  ECG     Assessment and  Plan  Aborted cardiac arrest   Ischemic Cardiomyopathy  Ventricular tachycardia  High Risk Medication Surveillance Sotalol  Sinus node dysfunction first-degree AV block  Implantable defibrillator   Hyperlipidemia  Phantom Shock   He is trying to track down in order and did not know where it was can I give you a normal number exactly track it okay very good as I needed thank you much explorers have any idea I alerted I did so I left the last order, no on #14 and before it is not an contact was #9 thank you  Device function is normal. Programming changes   See Paceart for details

## 2022-07-06 NOTE — Patient Instructions (Signed)

## 2022-07-10 ENCOUNTER — Other Ambulatory Visit: Payer: Self-pay | Admitting: Internal Medicine

## 2022-07-12 ENCOUNTER — Encounter: Payer: Medicare HMO | Admitting: Internal Medicine

## 2022-07-12 DIAGNOSIS — N401 Enlarged prostate with lower urinary tract symptoms: Secondary | ICD-10-CM | POA: Diagnosis not present

## 2022-07-21 ENCOUNTER — Ambulatory Visit: Payer: Medicare HMO | Admitting: Interventional Cardiology

## 2022-07-22 DIAGNOSIS — R350 Frequency of micturition: Secondary | ICD-10-CM | POA: Diagnosis not present

## 2022-07-22 DIAGNOSIS — R972 Elevated prostate specific antigen [PSA]: Secondary | ICD-10-CM | POA: Diagnosis not present

## 2022-07-22 DIAGNOSIS — N401 Enlarged prostate with lower urinary tract symptoms: Secondary | ICD-10-CM | POA: Diagnosis not present

## 2022-07-25 ENCOUNTER — Ambulatory Visit: Payer: Self-pay | Admitting: Surgery

## 2022-07-25 DIAGNOSIS — K4090 Unilateral inguinal hernia, without obstruction or gangrene, not specified as recurrent: Secondary | ICD-10-CM | POA: Diagnosis not present

## 2022-07-27 ENCOUNTER — Telehealth: Payer: Self-pay | Admitting: *Deleted

## 2022-07-27 NOTE — Telephone Encounter (Signed)
Left message to call back for tele pre op appt 

## 2022-07-27 NOTE — Telephone Encounter (Signed)
   Name: Joel Lin  DOB: [birth year]  MRN: [id number]  Primary Cardiologist: None   Preoperative team, please contact this patient and set up a phone call appointment for further preoperative risk assessment. Please obtain consent and complete medication review. Thank you for your help.  I confirm that guidance regarding antiplatelet and oral anticoagulation therapy has been completed and, if necessary, noted below.  Per office protocol, if patient is without any new symptoms or concerns at the time of his virtual visit, he may hold Aspirin for 5-7 days prior to procedure. Please resume Aspirin as soon as possible postprocedure, at the discretion of the surgeon.   Lenna Sciara, NP 07/27/2022, 1:03 PM [place]

## 2022-07-27 NOTE — Telephone Encounter (Signed)
   Pre-operative Risk Assessment    Patient Name: Joel Lin  DOB: 08/13/1941 MRN: [id number]      Request for Surgical Clearance    Procedure:   HERNIA SURGERY  Date of Surgery:  Clearance TBD                                Surgeon:  DR. Erroll Luna Surgeon's Group or Practice Name:  [facility] Phone number:  [phone] Fax number:  [phone] ATTN: Carlene Coria, CMA   Type of Clearance Requested:   - Medical ; HOLD ASA    Type of Anesthesia:  General    Additional requests/questions:    Jiles Prows   07/27/2022, 9:36 AM

## 2022-07-28 ENCOUNTER — Telehealth: Payer: Self-pay | Admitting: *Deleted

## 2022-07-28 NOTE — Telephone Encounter (Signed)
Pt has been scheduled for a tele visit, 08/04/22 9:00.  Medications reconciled / consent on file.     Patient Consent for Virtual Visit        Joel Lin has provided verbal consent on 07/28/2022 for a virtual visit (video or telephone).   CONSENT FOR VIRTUAL VISIT FOR:  Joel Lin  By participating in this virtual visit I agree to the following:  I hereby voluntarily request, consent and authorize [facility] and its employed or contracted physicians, physician assistants, nurse practitioners or other licensed health care professionals (the Practitioner), to provide me with telemedicine health care services (the "Services") as deemed necessary by the treating Practitioner. I acknowledge and consent to receive the Services by the Practitioner via telemedicine. I understand that the telemedicine visit will involve communicating with the Practitioner through live audiovisual communication technology and the disclosure of certain medical information by electronic transmission. I acknowledge that I have been given the opportunity to request an in-person assessment or other available alternative prior to the telemedicine visit and am voluntarily participating in the telemedicine visit.  I understand that I have the right to withhold or withdraw my consent to the use of telemedicine in the course of my care at any time, without affecting my right to future care or treatment, and that the Practitioner or I may terminate the telemedicine visit at any time. I understand that I have the right to inspect all information obtained and/or recorded in the course of the telemedicine visit and may receive copies of available information for a reasonable fee.  I understand that some of the potential risks of receiving the Services via telemedicine include:  Delay or interruption in medical evaluation due to technological equipment failure or disruption; Information transmitted may not be  sufficient (e.g. poor resolution of images) to allow for appropriate medical decision making by the Practitioner; and/or  In rare instances, security protocols could fail, causing a breach of personal health information.  Furthermore, I acknowledge that it is my responsibility to provide information about my medical history, conditions and care that is complete and accurate to the best of my ability. I acknowledge that Practitioner's advice, recommendations, and/or decision may be based on factors not within their control, such as incomplete or inaccurate data provided by me or distortions of diagnostic images or specimens that may result from electronic transmissions. I understand that the practice of medicine is not an exact science and that Practitioner makes no warranties or guarantees regarding treatment outcomes. I acknowledge that a copy of this consent can be made available to me via my patient portal ([facility]), or I can request a printed copy by calling the office of [facility].    I understand that my insurance will be billed for this visit.   I have read or had this consent read to me. I understand the contents of this consent, which adequately explains the benefits and risks of the Services being provided via telemedicine.  I have been provided ample opportunity to ask questions regarding this consent and the Services and have had my questions answered to my satisfaction. I give my informed consent for the services to be provided through the use of telemedicine in my medical care

## 2022-07-28 NOTE — Telephone Encounter (Signed)
Pt has been scheduled for a tele visit, 08/04/22 9:00

## 2022-08-04 ENCOUNTER — Ambulatory Visit (INDEPENDENT_AMBULATORY_CARE_PROVIDER_SITE_OTHER): Payer: Medicare HMO | Admitting: Physician Assistant

## 2022-08-04 DIAGNOSIS — Z01810 Encounter for preprocedural cardiovascular examination: Secondary | ICD-10-CM | POA: Diagnosis not present

## 2022-08-04 NOTE — Progress Notes (Signed)
Virtual Visit via Telephone Note   Because of Joel Lin's co-morbid illnesses, he is at least at moderate risk for complications without adequate follow up.  This format is felt to be most appropriate for this patient at this time.  The patient did not have access to video technology/had technical difficulties with video requiring transitioning to audio format only (telephone).  All issues noted in this document were discussed and addressed.  No physical exam could be performed with this format.  Please refer to the patient's chart for his consent to telehealth for [facility].  Evaluation Performed:  Preoperative cardiovascular risk assessment _____________   Date:  08/04/2022   Patient ID:  Joel Lin, DOB 11-21-1941, MRN [id number] Patient Location:  Home Provider location:   Office  Primary Care Provider:  [name], MD Primary Cardiologist:  None  Chief Complaint / Patient Profile   81 y.o. y/o male with a h/o cardiac arrest status post ICD, CAD, hyperlipidemia, ventricular tachycardia, hypercoagulable state, PAD who is pending hernia surgery and presents today for telephonic preoperative cardiovascular risk assessment.  Past Medical History    Past Medical History:  Diagnosis Date   AAA (abdominal aortic aneurysm) ([place])    Cardiac arrest ([place]) 03/29/2008   a. resuscitated OOH VF arrest  b. s/p STJ ICD   Contrast media allergy    Coronary artery disease    myoview 2011>>EF of 53% / large area of old inferolateral infarct but no reversible ischemia           Hemorrhoid    Hypercoagulable state ([place])    Questionable history of a borderline hypercoagulable state   Hyperlipidemia    Myocardial infarction ([place]) 1981   Peripheral vascular disease ([place])    PVCs (premature ventricular contractions)    occasionally   Ventricular tachycardia ([place])    a. appropriate ICD therpay b. on Sotalol   Past Surgical History:  Procedure Laterality Date    ABDOMINAL AORTIC ENDOVASCULAR STENT GRAFT N/A 12/06/2013   Procedure: ABDOMINAL AORTIC ENDOVASCULAR STENT GRAFT;  Surgeon: Rosetta Posner, MD;  Location: [facility];  Service: Vascular;  Laterality: N/A;   CARDIAC CATHETERIZATION  04/04/2008    LAD stent patent, D1 OK, OM1 90/80/80% (52m vessel), RCA 30%; EF 35%.   CARDIAC DEFIBRILLATOR PLACEMENT  04/07/2008    STJ single chamber ICD implanted for secondary prevention   CHOLECYSTECTOMY     CHOLECYSTECTOMY, LAPAROSCOPIC  08/13/2009   Calculous cholecystitis   CORONARY ANGIOPLASTY WITH STENT PLACEMENT  1999   PTCA and stenting of his left anterior descending artery   ICD REVISION N/A 02/13/2017   Procedure: Upgrade to Dual Chamber ICD;  Surgeon: Will MMeredith Leeds MD;  [facility];  Service: Cardiovascular;  Laterality: N/A;   LEFT HEART CATHETERIZATION WITH CORONARY ANGIOGRAM N/A 02/28/2013   Procedure: LEFT HEART CATHETERIZATION WITH CORONARY ANGIOGRAM;  Surgeon: MSherren Mocha MD;  [facility];  Service: Cardiovascular;  Laterality: N/A;   TONSILLECTOMY      Allergies  Allergies  Allergen Reactions   Epinephrine Other (See Comments)    Elevated heartbeat   Losartan Palpitations and Other (See Comments)    Irregular heartbeat   Metoprolol Other (See Comments)    Irregular heartbeat   Mydriacyl [Tropicamide] Other (See Comments)    Elevated heartbeat   Phenylephrine Hcl (Pressors) Other (See Comments)    Increased heartrate   Bisoprolol Other (See Comments)    Feels it does not agree with him  Clopidogrel Bisulfate Hives        Iodine Hives    "Renografin"   Statins Other (See Comments)    Muscle cramps (crestor low dose is ok)    History of Present Illness    Joel Lin is a 81 y.o. male who presents via audio/video conferencing for a telehealth visit today.  Pt was last seen in cardiology clinic on 07/06/2022 by Dr. Caryl Comes.  At that time Joel Lin was doing well.  Device was  functioning properly.  The patient is now pending procedure as outlined above. Since his last visit, he has been doing well from a cardiac standpoint.  He has no issues with walking 1-2 blocks, uses stairs at his home, and goes to the gym regularly to do strength exercises, rowing, and stretches.  He also occasionally plays top golf and goes fishing with his 4 grandkids.  Due to these activities, he has achieved a 5.07 on the DASI.  This exceeds the minimum 4 METS requirement.  He tells me today that Dr. Caryl Comes cleared him for surgery during his last appointment but the notes were unclear and never sent to the appropriate place.  He is established with the [employer] to help cover his [place]. We will need to give the patient a call and go over next steps.   Per office protocol it is okay to hold aspirin 5 to 7 days prior to the procedure.  Resume aspirin as soon as possible postprocedure at the discretion of the surgeon.  Home Medications    Prior to Admission medications   Medication Sig Start Date End Date Taking? Authorizing Provider  Alirocumab (PRALUENT) 75 MG/ML SOAJ Inject 1 pen into the skin every 14 (fourteen) days. 10/06/21   Deboraha Sprang, MD  aspirin 81 MG tablet Take 81 mg by mouth every evening.    [provider]  cholecalciferol (VITAMIN D) 1000 units tablet Take 1,000 Units by mouth daily.    [provider]  fexofenadine (ALLEGRA) 180 MG tablet Take 180 mg by mouth daily.    [provider]  fish oil-omega-3 fatty acids 1000 MG capsule Take 1 g by mouth daily.     [provider]  fluticasone (FLONASE) 50 MCG/ACT nasal spray Place 1 spray into both nostrils daily as needed for allergies or rhinitis.    [provider]  Glucosamine Sulfate-MSM (GLUCOSAMINE-MSM DS PO) Take 1 tablet by mouth daily.    [provider]  ibuprofen (ADVIL,MOTRIN) 200 MG tablet Take 400 mg by mouth daily as needed for moderate pain.      [provider]  lisinopril (ZESTRIL) 2.5 MG tablet TAKE 1 TABLET DAILY (DOSE  DECREASE) 04/28/22   Deboraha Sprang, MD  Melatonin 5 MG CAPS Take 5 mg by mouth at bedtime as needed (sleep).    [provider]  Multiple Vitamin (MULTIVITAMIN WITH MINERALS) TABS Take 1 tablet by mouth daily.    [provider]  nitroGLYCERIN (NITRODUR - DOSED IN MG/24 HR) 0.2 mg/hr patch APPLY 1 PATCH [facility]NTO THE [facility] 11/10/21   Deboraha Sprang, MD  nitroGLYCERIN (NITROSTAT) 0.4 MG SL tablet Place 1 tablet (0.4 mg total) under the tongue every 5 (five) minutes as needed for chest pain ((MAX 3 DOSES)). 10/06/21   Deboraha Sprang, MD  rosuvastatin (CRESTOR) 5 MG tablet TAKE 1 TABLET EVERY OTHER  DAY 07/11/22   Deboraha Sprang, MD  sotalol (BETAPACE) 80 MG tablet TAKE 1 TABLET  TWICE A DAY 04/28/22   Jettie Booze, MD  spironolactone (ALDACTONE) 25 MG tablet TAKE 1/2 TABLET DAILY 02/14/22   Deboraha Sprang, MD  tamsulosin (FLOMAX) 0.4 MG CAPS capsule Take 0.4 mg by mouth at bedtime as needed. 06/30/21   [provider]    Physical Exam    Vital Signs:  Joel Lin does not have vital signs available for review today.  Given telephonic nature of communication, physical exam is limited. AAOx3. NAD. Normal affect.  Speech and respirations are unlabored.  Accessory Clinical Findings    None  Assessment & Plan    1.  Preoperative Cardiovascular Risk Assessment:  Joel Lin perioperative risk of a major cardiac event is 6.6% according to the Revised Cardiac Risk Index (RCRI).  Therefore, he is at high risk for perioperative complications.   His functional capacity is good at 5.07 METs according to the [facility] Activity Status Index (DASI). Recommendations: According to ACC/AHA guidelines, no further cardiovascular testing needed.  The patient may proceed to surgery at acceptable risk.   Antiplatelet and/or Anticoagulation Recommendations: Aspirin can be held for 5-7  days prior to his surgery.  Please resume Aspirin post operatively when it is felt to be safe from a bleeding standpoint.   A copy of this note will be routed to requesting surgeon.  Time:   Today, I have spent 15 minutes with the patient with telehealth technology discussing medical history, symptoms, and management plan.     Elgie Collard, PA-C  08/04/2022, 8:34 AM

## 2022-08-09 ENCOUNTER — Ambulatory Visit (INDEPENDENT_AMBULATORY_CARE_PROVIDER_SITE_OTHER): Payer: Medicare HMO

## 2022-08-09 DIAGNOSIS — I472 Ventricular tachycardia, unspecified: Secondary | ICD-10-CM | POA: Diagnosis not present

## 2022-08-09 LAB — CUP PACEART REMOTE DEVICE CHECK
Battery Remaining Longevity: 42 mo
Battery Remaining Percentage: 45 %
Battery Voltage: 2.9 V
Brady Statistic AP VP Percent: 25 %
Brady Statistic AP VS Percent: 25 %
Brady Statistic AS VP Percent: 11 %
Brady Statistic AS VS Percent: 39 %
Brady Statistic RA Percent Paced: 48 %
Brady Statistic RV Percent Paced: 36 %
Date Time Interrogation Session: 20230829020016
HighPow Impedance: 49 Ohm
HighPow Impedance: 49 Ohm
Implantable Lead Implant Date: 20090427
Implantable Lead Implant Date: 20180305
Implantable Lead Location: 753859
Implantable Lead Location: 753860
Implantable Lead Model: 7121
Implantable Pulse Generator Implant Date: 20180305
Lead Channel Impedance Value: 310 Ohm
Lead Channel Impedance Value: 430 Ohm
Lead Channel Pacing Threshold Amplitude: 0.375 V
Lead Channel Pacing Threshold Amplitude: 1.25 V
Lead Channel Pacing Threshold Pulse Width: 0.5 ms
Lead Channel Pacing Threshold Pulse Width: 0.5 ms
Lead Channel Sensing Intrinsic Amplitude: 3.6 mV
Lead Channel Sensing Intrinsic Amplitude: 9.9 mV
Lead Channel Setting Pacing Amplitude: 1.375
Lead Channel Setting Pacing Amplitude: 1.5 V
Lead Channel Setting Pacing Pulse Width: 0.5 ms
Lead Channel Setting Sensing Sensitivity: 0.5 mV
Pulse Gen Serial Number: 1247854

## 2022-08-17 ENCOUNTER — Other Ambulatory Visit: Payer: Self-pay

## 2022-08-17 ENCOUNTER — Emergency Department (HOSPITAL_COMMUNITY)
Admission: EM | Admit: 2022-08-17 | Discharge: 2022-08-17 | Disposition: A | Discharge status: Home / Self Care | Payer: Medicare HMO | Attending: Emergency Medicine | Admitting: Emergency Medicine

## 2022-08-17 ENCOUNTER — Encounter (HOSPITAL_COMMUNITY): Payer: Self-pay | Admitting: Emergency Medicine

## 2022-08-17 ENCOUNTER — Emergency Department (HOSPITAL_COMMUNITY): Payer: Medicare HMO

## 2022-08-17 DIAGNOSIS — K4091 Unilateral inguinal hernia, without obstruction or gangrene, recurrent: Secondary | ICD-10-CM

## 2022-08-17 DIAGNOSIS — K5730 Diverticulosis of large intestine without perforation or abscess without bleeding: Secondary | ICD-10-CM | POA: Diagnosis not present

## 2022-08-17 DIAGNOSIS — Z743 Need for continuous supervision: Secondary | ICD-10-CM | POA: Diagnosis not present

## 2022-08-17 DIAGNOSIS — R1030 Lower abdominal pain, unspecified: Secondary | ICD-10-CM | POA: Diagnosis not present

## 2022-08-17 DIAGNOSIS — R609 Edema, unspecified: Secondary | ICD-10-CM | POA: Diagnosis not present

## 2022-08-17 DIAGNOSIS — Z7982 Long term (current) use of aspirin: Secondary | ICD-10-CM | POA: Insufficient documentation

## 2022-08-17 DIAGNOSIS — K4090 Unilateral inguinal hernia, without obstruction or gangrene, not specified as recurrent: Secondary | ICD-10-CM | POA: Insufficient documentation

## 2022-08-17 DIAGNOSIS — R1031 Right lower quadrant pain: Secondary | ICD-10-CM | POA: Diagnosis not present

## 2022-08-17 LAB — CBC WITH DIFFERENTIAL/PLATELET
Abs Immature Granulocytes: 0.01 10*3/uL (ref 0.00–0.07)
Basophils Absolute: 0 10*3/uL (ref 0.0–0.1)
Basophils Relative: 1 %
Eosinophils Absolute: 0.2 10*3/uL (ref 0.0–0.5)
Eosinophils Relative: 4 %
HCT: 45.2 % (ref 39.0–52.0)
Hemoglobin: 14.4 g/dL (ref 13.0–17.0)
Immature Granulocytes: 0 %
Lymphocytes Relative: 22 %
Lymphs Abs: 1 10*3/uL (ref 0.7–4.0)
MCH: 28.2 pg (ref 26.0–34.0)
MCHC: 31.9 g/dL (ref 30.0–36.0)
MCV: 88.5 fL (ref 80.0–100.0)
Monocytes Absolute: 0.7 10*3/uL (ref 0.1–1.0)
Monocytes Relative: 15 %
Neutro Abs: 2.6 10*3/uL (ref 1.7–7.7)
Neutrophils Relative %: 58 %
Platelets: 118 10*3/uL — ABNORMAL LOW (ref 150–400)
RBC: 5.11 MIL/uL (ref 4.22–5.81)
RDW: 13 % (ref 11.5–15.5)
WBC: 4.5 10*3/uL (ref 4.0–10.5)
nRBC: 0 % (ref 0.0–0.2)

## 2022-08-17 LAB — BASIC METABOLIC PANEL
Anion gap: 5 (ref 5–15)
BUN: 23 mg/dL (ref 8–23)
CO2: 27 mmol/L (ref 22–32)
Calcium: 9 mg/dL (ref 8.9–10.3)
Chloride: 105 mmol/L (ref 98–111)
Creatinine, Ser: 0.9 mg/dL (ref 0.61–1.24)
GFR, Estimated: >60 mL/min (ref >60)
Glucose, Bld: 90 mg/dL (ref 70–99)
Potassium: 4.9 mmol/L (ref 3.5–5.1)
Sodium: 137 mmol/L (ref 135–145)

## 2022-08-17 LAB — URINALYSIS, ROUTINE W REFLEX MICROSCOPIC
Bilirubin Urine: NEGATIVE
Glucose, UA: NEGATIVE mg/dL
Hgb urine dipstick: NEGATIVE
Ketones, ur: NEGATIVE mg/dL
Leukocytes,Ua: NEGATIVE
Nitrite: NEGATIVE
Protein, ur: NEGATIVE mg/dL
Specific Gravity, Urine: 1.009 (ref 1.005–1.030)
pH: 7 (ref 5.0–8.0)

## 2022-08-17 LAB — LACTIC ACID, PLASMA: Lactic Acid, Venous: 0.7 mmol/L (ref 0.5–1.9)

## 2022-08-17 MED ORDER — DIPHENHYDRAMINE HCL 25 MG PO CAPS: 50.0000 mg | ORAL_CAPSULE | Freq: Once | ORAL | Status: AC

## 2022-08-17 MED ORDER — IOHEXOL 300 MG/ML  SOLN
100.0000 mL | Freq: Once | INTRAMUSCULAR | Status: AC | PRN
  Administered 2022-08-17: 100 mL via INTRAVENOUS

## 2022-08-17 MED ORDER — METHYLPREDNISOLONE SODIUM SUCC 40 MG IJ SOLR
40.0000 mg | Freq: Once | INTRAMUSCULAR | Status: AC
Start: 2022-08-17 — End: 2022-08-17
  Administered 2022-08-17: 40 mg via INTRAVENOUS
  Filled 2022-08-17: qty 1

## 2022-08-17 MED ORDER — TRAMADOL HCL 50 MG PO TABS: 50.0000 mg | ORAL_TABLET | Freq: Three times a day (TID) | ORAL | 0 refills | Status: DC | PRN

## 2022-08-17 MED ORDER — FENTANYL CITRATE PF 50 MCG/ML IJ SOSY
50.0000 ug | PREFILLED_SYRINGE | Freq: Once | INTRAMUSCULAR | Status: AC
  Administered 2022-08-17: 50 ug via INTRAVENOUS
  Filled 2022-08-17: qty 1

## 2022-08-17 MED ORDER — DIPHENHYDRAMINE HCL 50 MG/ML IJ SOLN
50.0000 mg | Freq: Once | INTRAMUSCULAR | Status: AC
  Administered 2022-08-17: 50 mg via INTRAVENOUS
  Filled 2022-08-17: qty 1

## 2022-08-17 NOTE — ED Triage Notes (Signed)
Pt BIB [facility] from home due to increased pain and more swollen than normal RLQ.  Pt endorses he does have a hernia on that side and surgery coming up on the 13th of this month.

## 2022-08-17 NOTE — ED Notes (Addendum)
Patient is alert and in bed comfortable; given urinal.  Wife and daughter at bedside.  Call light within reach.

## 2022-08-17 NOTE — ED Provider Notes (Signed)
[facility] EMERGENCY DEPARTMENT Provider Note   CSN: [id number] Arrival date & time: 08/17/22  1004     History  Chief Complaint  Patient presents with   Abdominal Pain    Joel Lin is a 81 y.o. male presenting emergency department with abdominal pain.  The patient reports he has had a right inguinal hernia for about 3 months which has been symptomatic nearly every day.  He is scheduled for hernia repair on 08/24/22.  However his pain worsened last night, and therefore he came into the ED.  He denies nausea, vomiting, constipation.  Regular bowel movement yesterday.  He says the hernia symptoms bulges and is tender but currently is not bulging, however it is tender.  He is also feeling pain towards his right flank and his left lower abdomen.    HPI     Home Medications Prior to Admission medications   Medication Sig Start Date End Date Taking? Authorizing Provider  traMADol (ULTRAM) 50 MG tablet Take 1 tablet (50 mg total) by mouth every 8 (eight) hours as needed for up to 12 doses for severe pain. 08/17/22  Yes Celester Lech, Carola Rhine, MD  Alirocumab (PRALUENT) 75 MG/ML SOAJ Inject 1 pen into the skin every 14 (fourteen) days. 10/06/21   Deboraha Sprang, MD  aspirin 81 MG tablet Take 81 mg by mouth every evening.    [provider]  cholecalciferol (VITAMIN D) 1000 units tablet Take 1,000 Units by mouth daily.    [provider]  fexofenadine (ALLEGRA) 180 MG tablet Take 180 mg by mouth daily.    [provider]  fish oil-omega-3 fatty acids 1000 MG capsule Take 1 g by mouth daily.     [provider]  fluticasone (FLONASE) 50 MCG/ACT nasal spray Place 1 spray into both nostrils daily as needed for allergies or rhinitis.    [provider]  Glucosamine Sulfate-MSM (GLUCOSAMINE-MSM DS PO) Take 1 tablet by mouth daily.    [provider]  ibuprofen (ADVIL,MOTRIN) 200 MG tablet Take 400 mg by mouth daily as needed  for moderate pain.     [provider]  lisinopril (ZESTRIL) 2.5 MG tablet TAKE 1 TABLET DAILY (DOSE  DECREASE) 04/28/22   Deboraha Sprang, MD  Melatonin 5 MG CAPS Take 5 mg by mouth at bedtime as needed (sleep).    [provider]  Multiple Vitamin (MULTIVITAMIN WITH MINERALS) TABS Take 1 tablet by mouth daily.    [provider]  nitroGLYCERIN (NITRODUR - DOSED IN MG/24 HR) 0.2 mg/hr patch APPLY 1 PATCH ONTO THE [facility] 11/10/21   Deboraha Sprang, MD  nitroGLYCERIN (NITROSTAT) 0.4 MG SL tablet Place 1 tablet (0.4 mg total) under the tongue every 5 (five) minutes as needed for chest pain ((MAX 3 DOSES)). 10/06/21   Deboraha Sprang, MD  rosuvastatin (CRESTOR) 5 MG tablet TAKE 1 TABLET EVERY OTHER  DAY 07/11/22   Deboraha Sprang, MD  sotalol (BETAPACE) 80 MG tablet TAKE 1 TABLET TWICE A DAY 04/28/22   Jettie Booze, MD  spironolactone (ALDACTONE) 25 MG tablet TAKE 1/2 TABLET DAILY 02/14/22   Deboraha Sprang, MD  tamsulosin (FLOMAX) 0.4 MG CAPS capsule Take 0.4 mg by mouth at bedtime as needed. 06/30/21   [provider]      Allergies    Epinephrine, Losartan, Metoprolol, Mydriacyl [tropicamide], Phenylephrine hcl (pressors), Bisoprolol, Clopidogrel bisulfate, Iodine, and Statins    Review of Systems   Review of  Systems  Physical Exam Updated Vital Signs BP (!) 110/59   Pulse (!) 50   Temp 98 F (36.7 C) (Oral)   Resp 12   Ht '5\' 8"'$  (1.727 m)   Wt 73.5 kg   SpO2 96%   BMI 24.63 kg/m  Physical Exam Constitutional:      General: He is not in acute distress. HENT:     Head: Normocephalic and atraumatic.  Eyes:     Conjunctiva/sclera: Conjunctivae normal.     Pupils: Pupils are equal, round, and reactive to light.  Cardiovascular:     Rate and Rhythm: Normal rate and regular rhythm.  Pulmonary:     Effort: Pulmonary effort is normal. No respiratory distress.  Abdominal:     General: There is no distension.     Tenderness: There is no  abdominal tenderness.  Genitourinary:    Comments: Tender but reducible right inguinal hernia Skin:    General: Skin is warm and dry.  Neurological:     General: No focal deficit present.     Mental Status: He is alert. Mental status is at baseline.  Psychiatric:        Mood and Affect: Mood normal.        Behavior: Behavior normal.     ED Results / Procedures / Treatments   Labs (all labs ordered are listed, but only abnormal results are displayed) Labs Reviewed  CBC WITH DIFFERENTIAL/PLATELET - Abnormal; Notable for the following components:      Result Value   Platelets 118 (*)    All other components within normal limits  URINALYSIS, ROUTINE W REFLEX MICROSCOPIC - Abnormal; Notable for the following components:   Color, Urine STRAW (*)    All other components within normal limits  BASIC METABOLIC PANEL  LACTIC ACID, PLASMA    EKG None  Radiology CT ABDOMEN PELVIS W CONTRAST  Result Date: 08/17/2022 CLINICAL DATA:  Worsening right lower quadrant pain. Inguinal hernia. EXAM: CT ABDOMEN AND PELVIS WITH CONTRAST TECHNIQUE: Multidetector CT imaging of the abdomen and pelvis was performed using the standard protocol following bolus administration of intravenous contrast. RADIATION DOSE REDUCTION: This exam was performed according to the departmental dose-optimization program which includes automated exposure control, adjustment of the mA and/or kV according to patient size and/or use of iterative reconstruction technique. CONTRAST:  167m OMNIPAQUE IOHEXOL 300 MG/ML  SOLN COMPARISON:  CTA abdomen and pelvis 01/14/2014 FINDINGS: Lower chest: Mild dependent atelectasis in the lung bases. Small calcified granuloma in the right lung base. No pleural effusion. Coronary atherosclerosis. Pacemaker. Hepatobiliary: No focal liver abnormality is seen. There is no significant biliary dilatation status post cholecystectomy. Pancreas: Unremarkable. Spleen: Unremarkable. Adrenals/Urinary Tract:  Unremarkable adrenal glands. Interval enlargement of bilateral renal cysts with the largest measuring 4.8 cm in the interpolar right kidney; no follow-up imaging is recommended. No renal calculi or hydronephrosis. Unremarkable bladder. Stomach/Bowel: There is a small sliding hiatal hernia. There is no evidence of bowel obstruction or inflammation. Predominantly left-sided colonic diverticulosis is noted without evidence of acute diverticulitis. The appendix is unremarkable. Vascular/Lymphatic: Patent aortoiliac stent graft. Interval collapse of the native aortic aneurysm sac. Aortic atherosclerosis. No enlarged lymph nodes. Reproductive: Enlarged prostate. Other: No ascites or pneumoperitoneum. Fat-containing right inguinal hernia. No herniated bowel or significant acute inflammatory changes. Musculoskeletal: Mild lumbar levoscoliosis. Asymmetrically advanced disc degeneration on the right at L2-3 and on the left at L4-5 and L5-S1. Advanced facet arthrosis in the mid and lower lumbar spine with moderate multilevel neural foraminal  stenosis. IMPRESSION: 1. No acute abnormality identified in the abdomen or pelvis. 2. Fat-containing right inguinal hernia. 3. Colonic diverticulosis. 4. Aortic Atherosclerosis (ICD10-I70.0). Electronically Signed   By: Logan Bores M.D.   On: 08/17/2022 15:22    Procedures Procedures    Medications Ordered in ED Medications  methylPREDNISolone sodium succinate (SOLU-MEDROL) 40 mg/mL injection 40 mg (40 mg Intravenous Given 08/17/22 1053)  diphenhydrAMINE (BENADRYL) capsule 50 mg ( Oral See Alternative 08/17/22 1342)    Or  diphenhydrAMINE (BENADRYL) injection 50 mg (50 mg Intravenous Given 08/17/22 1342)  iohexol (OMNIPAQUE) 300 MG/ML solution 100 mL (100 mLs Intravenous Contrast Given 08/17/22 1506)  fentaNYL (SUBLIMAZE) injection 50 mcg (50 mcg Intravenous Given 08/17/22 1618)    ED Course/ Medical Decision Making/ A&P Clinical Course as of 08/18/22 0709  Wed Aug 17, 2022  1548  Lymphocyte #: 1.0 [MT]    Clinical Course User Index [MT] Wyvonnia Dusky, MD                           Medical Decision Making Amount and/or Complexity of Data Reviewed Labs: ordered. Decision-making details documented in ED Course. Radiology: ordered.  Risk Prescription drug management.   This patient presents to the Emergency Department with complaint of abdominal pain. This involves an extensive number of treatment options, and is a complaint that carries with it a high risk of complications and morbidity.  The differential diagnosis includes, but is not limited to, incarcerated hernia versus colitis versus ureteral colic versus UTI versus other  He does appear to have an indirect right inguinal hernia, it is tender to the touch but it is soft and reducible.  I do not see overlying skin changes, or he does not have GI symptoms to suspect acute incarceration at this time.  However given his worsening pain at home, I think a CT scan is reasonable, as he has not had imaging of this hernia site before.  He does report iodine allergies, which are hives.  Therefore he will need prep with Benadryl and steroids.  He reports he has had the prep before and had CT scans with iodine without issue.  I have a lower suspicion for AAA rupture or complication, or mesenteric ischemia with this clinical presentation.  I doubt GI perforation.  He does not have an acute abdomen.  I doubt testicular torsion per my exam  I ordered, reviewed, and interpreted labs, notable for no emergent findings I ordered imaging studies which included CT abdomen pelvis with IV contrast I independently visualized and interpreted imaging which showed fat containing inguinal hernia; AAA appears stable; and the monitor tracing which showed NSR Additional history was obtained from patient's wife at bedside  Single dose fentanyl given for pain in ED  After the interventions stated above, I reevaluated the patient and found  that they remained clinically stable.  Based on the patient's clinical exam, vital signs, risk factors, and ED testing, I felt that the patient's overall risk of life-threatening emergency such as bowel perforation, surgical emergency, or sepsis was quite low.  I suspect this clinical presentation is most consistent with inguinal hernia, but explained to the patient that this evaluation was not a definitive diagnostic workup.  I discussed outpatient follow up with primary care provider, and provided specialist office number on the patient's discharge paper if a referral was deemed necessary.  I discussed return precautions with the patient. I felt the patient was clinically stable for  discharge.         Final Clinical Impression(s) / ED Diagnoses Final diagnoses:  Unilateral recurrent inguinal hernia without obstruction or gangrene    Rx / DC Orders ED Discharge Orders          Ordered    traMADol (ULTRAM) 50 MG tablet  Every 8 hours PRN        08/17/22 1613              Wyvonnia Dusky, MD 08/18/22 0710

## 2022-08-17 NOTE — Discharge Instructions (Addendum)
Please follow-up with your surgeon in the office for your hernia.  I recommend to use laxatives if you are taking tramadol which is an opioid, as this can cause constipation.  Your CT scan showed that you have a fat-containing inguinal hernia on the right side.

## 2022-08-23 ENCOUNTER — Encounter (HOSPITAL_COMMUNITY): Payer: Self-pay | Admitting: Surgery

## 2022-08-23 ENCOUNTER — Other Ambulatory Visit: Payer: Self-pay

## 2022-08-23 ENCOUNTER — Encounter: Payer: Self-pay | Admitting: Internal Medicine

## 2022-08-23 NOTE — Progress Notes (Signed)
Anesthesia Chart Review: Joel Lin   Case: [id number] Date/Time: 08/24/22 1530   Procedure: RIGHT INGUINAL HERNIA REPAIR WITH MESH (Right) - GEN & TAP BLOCK   Anesthesia type: General   Pre-op diagnosis: RIGHT INGUINAL HERNIA   Location: [place] OR ROOM 09 / [place] OR   Surgeons: Erroll Luna, MD       DISCUSSION: Patient is an 81 year old male scheduled for the above procedure.  History includes former smoker (quit 12/23/79), CAD (large posterolateral MI 1981; PTCA LAD 1999; vfib arrest 03/29/08, defib x3 by EMS, s/p single chamber St. Jude ICD 04/07/08; stable coronary anatomy, medical therapy 02/28/13; upgrade to dual chamber [place] DR ICD 02/13/17), dysrhythmia (PVCs, VT s/p antitachycardia pacing 12/2011, s/p ICD shock 02/26/13 & 06/26/15, "phantom shock" 05/13/22), HLD, PVD, AAA (s/p Gore stent graft AAA repair 12/06/13), contrast allergy.   Preoperative cardiology input outlined by Nicholes Rough, PA-C on 08/04/22: "Preoperative Cardiovascular Risk Assessment:   Mr. Babington perioperative risk of a major cardiac event is 6.6% according to the Revised Cardiac Risk Index (RCRI).  Therefore, he is at high risk for perioperative complications.   His functional capacity is good at 5.07 METs according to the Duke Activity Status Index (DASI). Recommendations: According to ACC/AHA guidelines, no further cardiovascular testing needed.  The patient may proceed to surgery at acceptable risk.   Antiplatelet and/or Anticoagulation Recommendations: Aspirin can be held for 5-7 days prior to his surgery.  Please resume Aspirin post operatively when it is felt to be safe from a bleeding standpoint."     EP perioperative ICD Recommendations: Device Information: Clinic EP Physician:  Cristopher Peru, MD  Device Type:  Defibrillator Manufacturer and Phone #:  St. Jude/Abbott: [phone] Pacemaker Dependent?:  No. Date of Last Device Check:  08/09/22           Normal Device Function?:  Yes.      Electrophysiologist's Recommendations: Have magnet available. Provide continuous ECG monitoring when magnet is used or reprogramming is to be performed.  Procedure may interfere with device function.  Magnet should be placed over device during procedure.    He is a same day work-up. CBC and BMET  from 08/17/22 ED visit for abdominal pain appear acceptable for OR. Anesthesia team to evaluate on the day of surgery.   VS:  BP Readings from Last 3 Encounters:  08/17/22 (!) 110/59  07/06/22 112/66  05/13/22 (!) 141/64   Pulse Readings from Last 3 Encounters:  08/17/22 (!) 50  07/06/22 (!) 53  05/13/22 (!) 50     PROVIDERS: [name], MD is PCP  Virl Axe, MD is EP cardiologist. Normal device functio nat 07/06/22 visit.  Larae Grooms, MD is cardiologist. Last visit 10/19/20.    LABS: Most recent lab results in [facility] include: Lab Results  Component Value Date   WBC 4.5 08/17/2022   HGB 14.4 08/17/2022   HCT 45.2 08/17/2022   PLT 118 (L) 08/17/2022   GLUCOSE 90 08/17/2022   ALT 27 03/24/2021   AST 34 03/24/2021   NA 137 08/17/2022   K 4.9 08/17/2022   CL 105 08/17/2022   CREATININE 0.90 08/17/2022   BUN 23 08/17/2022   CO2 27 08/17/2022     IMAGES: CT Abd/pelvis 08/17/22  IMPRESSION: 1. No acute abnormality identified in the abdomen or pelvis. 2. Fat-containing right inguinal hernia. 3. Colonic diverticulosis. 4. Aortic Atherosclerosis (ICD10-I70.0).   1V PCXR 05/13/22: FINDINGS: Left-sided pacemaker again seen. The heart size and mediastinal  contours are within normal limits. Both lungs are clear. There are healed right-sided rib fractures, unchanged. IMPRESSION: No active disease.    EKG: 07/06/22 ([facility]): SB at 53 bpm. First degree AV block. Inferolateral infarct (old). Anterolateral T wave abnormality, consider ischemia.     CV: Echo 01/20/17: Study Conclusions  - Left ventricle: Inadequate for regional wall motion. There    appears to  be posterior, inferior and inferolateral akinesis but    endocardium is not well visualized. Recommend limted echo with    definity to define further. The cavity size was normal. There was    mild concentric hypertrophy. Systolic function was mildly to    moderately reduced. The estimated ejection fraction was in the    range of 40% to 45%. There is akinesis of the inferolateral and    inferior myocardium. There was an increased relative contribution    of atrial contraction to ventricular filling. Doppler parameters    are consistent with abnormal left ventricular relaxation (grade 1    diastolic dysfunction).  - Aortic valve: Mildly thickened, mildly calcified leaflets.  - Mitral valve: Calcified annulus. Mildly thickened leaflets .    There was mild regurgitation.  - Pulmonary arteries: Systolic pressure could not be accurately    estimated.    ETT 01/08/16: Findings: The patient exercised following a modified Bruce protocol.   The patient reported no symptoms during the stress test. The patient experienced no angina during the stress test.  Blood pressure and heart rate demonstrated a normal response to exercise. Overall, the patient's exercise capacity was normal.  The patient's response to exercise was adequate for diagnosis.   Cardiac cath 02/28/13: Coronary angiography: Coronary dominance: right   Left mainstem: There is mild stenosis in the mid shaft of the left main. This is estimated at 30-40%. The left main divides into the LAD and left circumflex the   Left anterior descending (LAD): The LAD is a large caliber vessel. There is a patent stent in the mid vessel. The proximal vessel is widely patent. Just at the level of the first septal perforator, there is a 30-40% stenosis. The first diagonal is small. The second diagonal is large in caliber. There is a 50% ostial lesion of the second diagonal. The mid and distal LAD are large in caliber without significant stenosis.    Left circumflex (LCx): The left circumflex is patent with no significant proximal vessel stenosis. The first obtuse marginal has 95% stenosis. This is unchanged from the previous study. This is a small caliber vessel. A subbranch of that vessel has an 80% stenosis. The subbranch is even smaller.   Right coronary artery (RCA): The RCA is dominant. The vessel is patent throughout its course. The distal vessel has 50% stenosis. There is mild diffuse calcification of the RCA. The proximal vessel has 20-30% stenosis. The mid vessel has 20-30% stenosis. The PDA and PLA branches are patent.   Left ventriculography: There is dense akinesis of the inferoapical wall. The other segments of the left ventricle contract normally. The estimated left ventricular ejection fraction is 40%   Final Conclusions:   1. Continued patency of the stented segment in the LAD with mild nonobstructive disease 2. Severe stenosis in the obtuse marginal branch left circumflex, stable from 2009 study 3. Nonobstructive stenosis of the right coronary artery 4. Moderate segmental left ventricular systolic dysfunction   Recommendations: Continue medical therapy for ischemic cardiomyopathy and CAD. The patient's coronary anatomy is unchanged from 2009.   No  significant carotid artery stenosis by [place] on 09/12/01.  Past Medical History:  Diagnosis Date   AAA (abdominal aortic aneurysm) ([place])    Cardiac arrest ([place]) 03/29/2008   a. resuscitated OOH VF arrest  b. s/p STJ ICD   Contrast media allergy    Coronary artery disease    myoview 2011>>EF of 53% / large area of old inferolateral infarct but no reversible ischemia           Hemorrhoid    Hypercoagulable state ([place])    Questionable history of a borderline hypercoagulable state   Hyperlipidemia    Myocardial infarction [facility]) 1981   Peripheral vascular disease ([place])    PVCs (premature ventricular contractions)    occasionally   Ventricular tachycardia ([place])    a.  appropriate ICD therpay b. on Sotalol    Past Surgical History:  Procedure Laterality Date   ABDOMINAL AORTIC ENDOVASCULAR STENT GRAFT N/A 12/06/2013   Procedure: ABDOMINAL AORTIC ENDOVASCULAR STENT GRAFT;  Surgeon: Rosetta Posner, MD;  Location: [place];  Service: Vascular;  Laterality: N/A;   CARDIAC CATHETERIZATION  04/04/2008    LAD stent patent, D1 OK, OM1 90/80/80% (67m vessel), RCA 30%; EF 35%.   CARDIAC DEFIBRILLATOR PLACEMENT  04/07/2008    STJ single chamber ICD implanted for secondary prevention   CHOLECYSTECTOMY     CHOLECYSTECTOMY, LAPAROSCOPIC  08/13/2009   Calculous cholecystitis   CORONARY ANGIOPLASTY WITH STENT PLACEMENT  1999   PTCA and stenting of his left anterior descending artery   ICD REVISION N/A 02/13/2017   Procedure: Upgrade to Dual Chamber ICD;  Surgeon: Will MMeredith Leeds MD;  [facility];  Service: Cardiovascular;  Laterality: N/A;   LEFT HEART CATHETERIZATION WITH CORONARY ANGIOGRAM N/A 02/28/2013   Procedure: LEFT HEART CATHETERIZATION WITH CORONARY ANGIOGRAM;  Surgeon: MSherren Mocha MD;  [facility];  Service: Cardiovascular;  Laterality: N/A;   TONSILLECTOMY      MEDICATIONS: No current facility-administered medications for this encounter.    Alirocumab (PRALUENT) 75 MG/ML SOAJ   aspirin 81 MG tablet   cholecalciferol (VITAMIN D) 1000 units tablet   fexofenadine (ALLEGRA) 180 MG tablet   fish oil-omega-3 fatty acids 1000 MG capsule   fluticasone (FLONASE) 50 MCG/ACT nasal spray   Glucosamine Sulfate-MSM (GLUCOSAMINE-MSM DS PO)   ibuprofen (ADVIL,MOTRIN) 200 MG tablet   lisinopril (ZESTRIL) 2.5 MG tablet   Melatonin 5 MG CAPS   Multiple Vitamin (MULTIVITAMIN WITH MINERALS) TABS   nitroGLYCERIN (NITRODUR - DOSED IN MG/24 HR) 0.2 mg/hr patch   nitroGLYCERIN (NITROSTAT) 0.4 MG SL tablet   polyethylene glycol (MIRALAX / GLYCOLAX) 17 g packet   rosuvastatin (CRESTOR) 5 MG tablet   sotalol (BETAPACE) 80 MG tablet    spironolactone (ALDACTONE) 25 MG tablet   traMADol (ULTRAM) 50 MG tablet    AMyra Gianotti PA-C Surgical Short Stay/Anesthesiology [facility]erryPhone [phone] Health Hospital Of Western MassPhone ([phone]/11/2022 12:00 PM

## 2022-08-23 NOTE — Progress Notes (Addendum)
Spoke with pt and his daughter for pre-op call. Pt has hx of ICD with pacemaker due to a cardiac arrest 03/29/2008. Pt also had stents placed in 2005. Denies any recent chest pain or shortness of breath. Requested device orders for ICD and received orders today. Magnet needs to be available if needed. Pt states he is not diabetic.  Last dose of Aspirin was 08/17/22 - was instructed to hold 5-7 days prior to surgery.  Cardiac clearance is in Epic 08/04/22 Shower instructions given to pt and daughter and he voiced understanding.

## 2022-08-23 NOTE — Progress Notes (Signed)
PERIOPERATIVE PRESCRIPTION FOR IMPLANTED CARDIAC DEVICE PROGRAMMING  Patient Information: Name:  Joel Lin  DOB:  07/26/1941  MRN:  [id number]    Derl Barrow, RN  [facility] Device Planned Procedure:  Right inguinal hernia repair  Surgeon:  Dr. Brantley Stage  Date of Procedure:  08/24/22  Cautery will be used.  Position during surgery:  supine   Please send documentation back to:  [facility] (Fax # [phone])   Device Information:  Clinic EP Physician:  Cristopher Peru, MD   Device Type:  Defibrillator Manufacturer and Phone #:  St. Jude/Abbott: [phone] Pacemaker Dependent?:  No. Date of Last Device Check:  08/09/22 Normal Device Function?:  Yes.    Electrophysiologist's Recommendations:  Have magnet available. Provide continuous ECG monitoring when magnet is used or reprogramming is to be performed.  Procedure may interfere with device function.  Magnet should be placed over device during procedure.  Per Device Clinic Standing Orders, Simone Curia, RN  8:46 AM 08/23/2022

## 2022-08-23 NOTE — Anesthesia Preprocedure Evaluation (Signed)
Anesthesia Evaluation  Patient identified by MRN, date of birth, ID band Patient awake    Reviewed: Allergy & Precautions, NPO status , Patient's Chart, lab work & pertinent test results  Airway Mallampati: III  TM Distance: >3 FB Neck ROM: Full    Dental  (+) Teeth Intact, Dental Advisory Given   Pulmonary former smoker,  20 pack year history    Pulmonary exam normal breath sounds clear to auscultation       Cardiovascular hypertension, Pt. on medications + CAD, + Past MI, + Peripheral Vascular Disease and +CHF (LVEF 40-45%)  + pacemaker + Cardiac Defibrillator  Rhythm:Regular Rate:Normal  CAD (large posterolateral MI 1981; PTCA LAD 1999; vfib arrest 03/29/08, defib x3 by EMS, s/p single chamber St. Jude ICD 04/07/08; stable coronary anatomy, medical therapy 02/28/13; upgrade to dual chamber [place] DR ICD 02/13/17), dysrhythmia (PVCs, VT s/p antitachycardia pacing 12/2011, s/p ICD shock 02/26/13 & 06/26/15, "phantom shock" 05/13/22), HLD, PVD, AAA (s/p Gore stent graft AAA repair 12/06/13)    Echo 01/20/17: Study Conclusions  - Left ventricle: Inadequate for regional wall motion. There  appears to be posterior, inferior and inferolateral akinesis but  endocardium is not well visualized. Recommend limted echo with  definity to define further. The cavity size was normal. There was  mild concentric hypertrophy. Systolic function was mildly to  moderately reduced. The estimated ejection fraction was in the  range of 40% to 45%. There is akinesis of the inferolateral and  inferior myocardium. There was an increased relative contribution  of atrial contraction to ventricular filling. Doppler parameters  are consistent with abnormal left ventricular relaxation (grade 1  diastolic dysfunction).  - Aortic valve: Mildly thickened, mildly calcified leaflets.  - Mitral valve: Calcified annulus. Mildly thickened  leaflets .  There was mild regurgitation.  - Pulmonary arteries: Systolic pressure could not be accurately  estimated.    ETT 01/08/16: Findings: The patient exercised following a modified Bruce protocol.  The patient reported no symptoms during the stress test. The patient experienced no angina during the stress test.  Blood pressure and heart rate demonstrated a normal response to exercise. Overall, the patient's exercise capacity was normal.  The patient's response to exercise was adequate for diagnosis.   Cardiac cath 02/28/13: Coronary angiography: Coronary dominance:right  Left mainstem: There is mild stenosis in the mid shaft of the left main. This is estimated at 30-40%. The left main divides into the LAD and left circumflex the  Left anterior descending (LAD): The LAD is a large caliber vessel. There is a patent stent in the mid vessel. The proximal vessel is widely patent. Just at the level of the first septal perforator, there is a 30-40% stenosis. The first diagonal is small. The second diagonal is large in caliber. There is a 50% ostial lesion of the second diagonal. The mid and distal LAD are large in caliber without significant stenosis.  Left circumflex (LCx): The left circumflex is patent with no significant proximal vessel stenosis. The first obtuse marginal has 95% stenosis. This is unchanged from the previous study. This is a small caliber vessel. A subbranch of that vessel has an 80% stenosis. The subbranch is even smaller.  Right coronary artery (RCA): The RCA is dominant. The vessel is patent throughout its course. The distal vessel has 50% stenosis. There is mild diffuse calcification of the RCA. The proximal vessel has 20-30% stenosis. The mid vessel has 20-30% stenosis. The PDA and PLA branches are patent.  Left ventriculography: There is dense akinesis of the inferoapical wall.The other segments of the left ventricle contract normally. The estimated  left ventricular ejection fraction is 40%  Final Conclusions:  1. Continued patency of the stented segment in the LAD with mild nonobstructive disease 2. Severe stenosis in the obtuse marginal branch left circumflex, stable from 2009 study 3. Nonobstructive stenosis of the right coronary artery 4. Moderate segmental left ventricular systolic dysfunction  Recommendations: Continue medical therapy for ischemic cardiomyopathy and CAD. The patient's coronary anatomy is unchanged from 2009.    Neuro/Psych negative neurological ROS  negative psych ROS   GI/Hepatic negative GI ROS, Neg liver ROS,   Endo/Other  negative endocrine ROS  Renal/GU negative Renal ROS  negative genitourinary   Musculoskeletal  (+) Arthritis , Osteoarthritis,    Abdominal   Peds negative pediatric ROS (+)  Hematology Hb 14.4, plt 118   Anesthesia Other Findings Right inguinal hernia   Reproductive/Obstetrics negative OB ROS                         Anesthesia Physical Anesthesia Plan  ASA: 3  Anesthesia Plan: General and Regional   Post-op Pain Management: Regional block* and Tylenol PO (pre-op)*   Induction: Intravenous  PONV Risk Score and Plan: 3 and Ondansetron, Dexamethasone and Treatment may vary due to age or medical condition  Airway Management Planned: Oral ETT  Additional Equipment: None  Intra-op Plan:   Post-operative Plan: Extubation in OR  Informed Consent: I have reviewed the patients History and Physical, chart, labs and discussed the procedure including the risks, benefits and alternatives for the proposed anesthesia with the patient or authorized representative who has indicated his/her understanding and acceptance.     Dental advisory given  Plan Discussed with: CRNA  Anesthesia Plan Comments: (Electrophysiologist's Recommendations: Have magnet available.)      Anesthesia Quick Evaluation

## 2022-08-24 ENCOUNTER — Encounter (HOSPITAL_COMMUNITY): Payer: Self-pay | Admitting: Surgery

## 2022-08-24 ENCOUNTER — Encounter (HOSPITAL_COMMUNITY)
Admission: RE | Disposition: A | Discharge status: Home / Self Care | Payer: Self-pay | Source: Home / Self Care | Attending: Surgery

## 2022-08-24 ENCOUNTER — Other Ambulatory Visit: Payer: Self-pay

## 2022-08-24 ENCOUNTER — Ambulatory Visit (HOSPITAL_COMMUNITY): Payer: Medicare HMO | Admitting: Vascular Surgery

## 2022-08-24 ENCOUNTER — Ambulatory Visit (HOSPITAL_COMMUNITY)
Admission: RE | Admit: 2022-08-24 | Discharge: 2022-08-24 | Disposition: A | Discharge status: Home / Self Care | Payer: Medicare HMO | Attending: Surgery | Admitting: Surgery

## 2022-08-24 ENCOUNTER — Ambulatory Visit (HOSPITAL_BASED_OUTPATIENT_CLINIC_OR_DEPARTMENT_OTHER): Payer: Medicare HMO | Admitting: Vascular Surgery

## 2022-08-24 DIAGNOSIS — Z8674 Personal history of sudden cardiac arrest: Secondary | ICD-10-CM | POA: Diagnosis not present

## 2022-08-24 DIAGNOSIS — K4090 Unilateral inguinal hernia, without obstruction or gangrene, not specified as recurrent: Secondary | ICD-10-CM | POA: Insufficient documentation

## 2022-08-24 DIAGNOSIS — Z87891 Personal history of nicotine dependence: Secondary | ICD-10-CM | POA: Insufficient documentation

## 2022-08-24 DIAGNOSIS — I509 Heart failure, unspecified: Secondary | ICD-10-CM

## 2022-08-24 DIAGNOSIS — Z955 Presence of coronary angioplasty implant and graft: Secondary | ICD-10-CM | POA: Insufficient documentation

## 2022-08-24 DIAGNOSIS — I2510 Atherosclerotic heart disease of native coronary artery without angina pectoris: Secondary | ICD-10-CM | POA: Diagnosis not present

## 2022-08-24 DIAGNOSIS — E785 Hyperlipidemia, unspecified: Secondary | ICD-10-CM | POA: Insufficient documentation

## 2022-08-24 DIAGNOSIS — Z79899 Other long term (current) drug therapy: Secondary | ICD-10-CM | POA: Diagnosis not present

## 2022-08-24 DIAGNOSIS — I252 Old myocardial infarction: Secondary | ICD-10-CM

## 2022-08-24 DIAGNOSIS — I110 Hypertensive heart disease with heart failure: Secondary | ICD-10-CM

## 2022-08-24 DIAGNOSIS — K219 Gastro-esophageal reflux disease without esophagitis: Secondary | ICD-10-CM | POA: Diagnosis not present

## 2022-08-24 DIAGNOSIS — Z95810 Presence of automatic (implantable) cardiac defibrillator: Secondary | ICD-10-CM | POA: Insufficient documentation

## 2022-08-24 DIAGNOSIS — G8918 Other acute postprocedural pain: Secondary | ICD-10-CM | POA: Diagnosis not present

## 2022-08-24 HISTORY — DX: Unspecified osteoarthritis, unspecified site: M19.90

## 2022-08-24 HISTORY — PX: INSERTION OF MESH: SHX5868

## 2022-08-24 HISTORY — DX: Pneumonia, unspecified organism: J18.9

## 2022-08-24 HISTORY — PX: INGUINAL HERNIA REPAIR: SHX194

## 2022-08-24 HISTORY — DX: Presence of automatic (implantable) cardiac defibrillator: Z95.810

## 2022-08-24 SURGERY — REPAIR, HERNIA, INGUINAL, ADULT
Anesthesia: Regional | Site: Groin | Laterality: Right

## 2022-08-24 MED ORDER — SUCCINYLCHOLINE CHLORIDE 200 MG/10ML IV SOSY
PREFILLED_SYRINGE | INTRAVENOUS | Status: AC
  Filled 2022-08-24: qty 10

## 2022-08-24 MED ORDER — ROPIVACAINE HCL 5 MG/ML IJ SOLN
INTRAMUSCULAR | Status: DC | PRN
  Administered 2022-08-24: 20 mL via PERINEURAL

## 2022-08-24 MED ORDER — CHLORHEXIDINE GLUCONATE 0.12 % MT SOLN
OROMUCOSAL | Status: AC
  Administered 2022-08-24: 15 mL via OROMUCOSAL
  Filled 2022-08-24: qty 15

## 2022-08-24 MED ORDER — ROCURONIUM BROMIDE 10 MG/ML (PF) SYRINGE
PREFILLED_SYRINGE | INTRAVENOUS | Status: AC
  Filled 2022-08-24: qty 10

## 2022-08-24 MED ORDER — LACTATED RINGERS IV SOLN: INTRAVENOUS | Status: DC

## 2022-08-24 MED ORDER — CHLORHEXIDINE GLUCONATE 0.12 % MT SOLN: 15.0000 mL | Freq: Once | OROMUCOSAL | Status: AC

## 2022-08-24 MED ORDER — FENTANYL CITRATE (PF) 250 MCG/5ML IJ SOLN
INTRAMUSCULAR | Status: AC
  Filled 2022-08-24: qty 5

## 2022-08-24 MED ORDER — BUPIVACAINE LIPOSOME 1.3 % IJ SUSP
INTRAMUSCULAR | Status: DC | PRN
  Administered 2022-08-24: 10 mL via PERINEURAL

## 2022-08-24 MED ORDER — PROPOFOL 10 MG/ML IV BOLUS
INTRAVENOUS | Status: AC
  Filled 2022-08-24: qty 20

## 2022-08-24 MED ORDER — ONDANSETRON HCL 4 MG/2ML IJ SOLN: 4.0000 mg | Freq: Once | INTRAMUSCULAR | Status: DC | PRN

## 2022-08-24 MED ORDER — BUPIVACAINE-EPINEPHRINE (PF) 0.25% -1:200000 IJ SOLN
INTRAMUSCULAR | Status: AC
  Filled 2022-08-24: qty 30

## 2022-08-24 MED ORDER — ROCURONIUM BROMIDE 10 MG/ML (PF) SYRINGE
PREFILLED_SYRINGE | INTRAVENOUS | Status: DC | PRN
  Administered 2022-08-24: 60 mg via INTRAVENOUS

## 2022-08-24 MED ORDER — FENTANYL CITRATE (PF) 100 MCG/2ML IJ SOLN
INTRAMUSCULAR | Status: AC
  Administered 2022-08-24: 50 ug via INTRAVENOUS
  Filled 2022-08-24: qty 2

## 2022-08-24 MED ORDER — LIDOCAINE 2% (20 MG/ML) 5 ML SYRINGE
INTRAMUSCULAR | Status: DC | PRN
  Administered 2022-08-24: 40 mg via INTRAVENOUS

## 2022-08-24 MED ORDER — ACETAMINOPHEN 500 MG PO TABS
1000.0000 mg | ORAL_TABLET | Freq: Once | ORAL | Status: AC
  Administered 2022-08-24: 1000 mg via ORAL

## 2022-08-24 MED ORDER — FENTANYL CITRATE (PF) 250 MCG/5ML IJ SOLN
INTRAMUSCULAR | Status: DC | PRN
  Administered 2022-08-24 (×2): 50 ug via INTRAVENOUS

## 2022-08-24 MED ORDER — OXYCODONE HCL 5 MG PO TABS
5.0000 mg | ORAL_TABLET | Freq: Once | ORAL | Status: AC | PRN
  Administered 2022-08-24: 5 mg via ORAL

## 2022-08-24 MED ORDER — CHLORHEXIDINE GLUCONATE CLOTH 2 % EX PADS: 6.0000 | MEDICATED_PAD | Freq: Once | CUTANEOUS | Status: DC

## 2022-08-24 MED ORDER — OXYCODONE HCL 5 MG PO TABS: 5.0000 mg | ORAL_TABLET | Freq: Four times a day (QID) | ORAL | 0 refills | Status: DC | PRN

## 2022-08-24 MED ORDER — ONDANSETRON HCL 4 MG/2ML IJ SOLN
INTRAMUSCULAR | Status: DC | PRN
  Administered 2022-08-24: 4 mg via INTRAVENOUS

## 2022-08-24 MED ORDER — 0.9 % SODIUM CHLORIDE (POUR BTL) OPTIME
TOPICAL | Status: DC | PRN
  Administered 2022-08-24: 1000 mL

## 2022-08-24 MED ORDER — OXYCODONE HCL 5 MG PO TABS
ORAL_TABLET | ORAL | Status: AC
  Filled 2022-08-24: qty 1

## 2022-08-24 MED ORDER — CEFAZOLIN SODIUM-DEXTROSE 2-4 GM/100ML-% IV SOLN
2.0000 g | INTRAVENOUS | Status: AC
  Administered 2022-08-24: 2 g via INTRAVENOUS
  Filled 2022-08-24: qty 100

## 2022-08-24 MED ORDER — ONDANSETRON HCL 4 MG/2ML IJ SOLN
INTRAMUSCULAR | Status: AC
  Filled 2022-08-24: qty 8

## 2022-08-24 MED ORDER — ORAL CARE MOUTH RINSE: 15.0000 mL | Freq: Once | OROMUCOSAL | Status: AC

## 2022-08-24 MED ORDER — SUGAMMADEX SODIUM 200 MG/2ML IV SOLN
INTRAVENOUS | Status: DC | PRN
  Administered 2022-08-24: 200 mg via INTRAVENOUS

## 2022-08-24 MED ORDER — DEXAMETHASONE SODIUM PHOSPHATE 10 MG/ML IJ SOLN
INTRAMUSCULAR | Status: DC | PRN
  Administered 2022-08-24: 5 mg via INTRAVENOUS

## 2022-08-24 MED ORDER — AMISULPRIDE (ANTIEMETIC) 5 MG/2ML IV SOLN: 10.0000 mg | Freq: Once | INTRAVENOUS | Status: DC | PRN

## 2022-08-24 MED ORDER — LIDOCAINE 2% (20 MG/ML) 5 ML SYRINGE
INTRAMUSCULAR | Status: AC
  Filled 2022-08-24: qty 5

## 2022-08-24 MED ORDER — PROPOFOL 10 MG/ML IV BOLUS
INTRAVENOUS | Status: DC | PRN
  Administered 2022-08-24: 30 mg via INTRAVENOUS
  Administered 2022-08-24: 80 mg via INTRAVENOUS

## 2022-08-24 MED ORDER — DEXAMETHASONE SODIUM PHOSPHATE 10 MG/ML IJ SOLN
INTRAMUSCULAR | Status: AC
  Filled 2022-08-24: qty 4

## 2022-08-24 MED ORDER — OXYCODONE HCL 5 MG/5ML PO SOLN: 5.0000 mg | Freq: Once | ORAL | Status: AC | PRN

## 2022-08-24 MED ORDER — FENTANYL CITRATE (PF) 100 MCG/2ML IJ SOLN
25.0000 ug | INTRAMUSCULAR | Status: DC | PRN
  Administered 2022-08-24: 50 ug via INTRAVENOUS

## 2022-08-24 MED ORDER — FENTANYL CITRATE (PF) 100 MCG/2ML IJ SOLN: 50.0000 ug | Freq: Once | INTRAMUSCULAR | Status: AC

## 2022-08-24 MED ORDER — FENTANYL CITRATE (PF) 100 MCG/2ML IJ SOLN
INTRAMUSCULAR | Status: AC
  Filled 2022-08-24: qty 2

## 2022-08-24 MED ORDER — MIDAZOLAM HCL 2 MG/2ML IJ SOLN
INTRAMUSCULAR | Status: AC
  Filled 2022-08-24: qty 2

## 2022-08-24 MED ORDER — ACETAMINOPHEN 500 MG PO TABS
1000.0000 mg | ORAL_TABLET | ORAL | Status: DC
  Filled 2022-08-24: qty 2

## 2022-08-24 MED ORDER — EPHEDRINE SULFATE-NACL 50-0.9 MG/10ML-% IV SOSY
PREFILLED_SYRINGE | INTRAVENOUS | Status: DC | PRN
  Administered 2022-08-24: 5 mg via INTRAVENOUS

## 2022-08-24 MED ORDER — BUPIVACAINE-EPINEPHRINE 0.25% -1:200000 IJ SOLN
INTRAMUSCULAR | Status: DC | PRN
  Administered 2022-08-24: 10 mL

## 2022-08-24 SURGICAL SUPPLY — 34 items
ADH SKN CLS APL DERMABOND .7 (GAUZE/BANDAGES/DRESSINGS) ×2
APL PRP STRL LF DISP 70% ISPRP (MISCELLANEOUS) ×1
BAG COUNTER SPONGE SURGICOUNT (BAG) ×1 IMPLANT
BAG SPNG CNTER NS LX DISP (BAG) ×1
BLADE CLIPPER SURG (BLADE) IMPLANT
CANISTER SUCT 3000ML PPV (MISCELLANEOUS) IMPLANT
CHLORAPREP W/TINT 26 (MISCELLANEOUS) ×1 IMPLANT
COVER SURGICAL LIGHT HANDLE (MISCELLANEOUS) ×1 IMPLANT
DERMABOND ADVANCED .7 DNX12 (GAUZE/BANDAGES/DRESSINGS) ×1 IMPLANT
DRAIN PENROSE 1X12 (DRAIN) IMPLANT
DRAPE LAPAROTOMY TRNSV 102X78 (DRAPES) ×1 IMPLANT
ELECT REM PT RETURN 9FT ADLT (ELECTROSURGICAL) ×1
ELECTRODE REM PT RTRN 9FT ADLT (ELECTROSURGICAL) ×1 IMPLANT
GLOVE BIO SURGEON STRL SZ8 (GLOVE) ×1 IMPLANT
GLOVE BIOGEL PI IND STRL 8 (GLOVE) ×1 IMPLANT
GOWN STRL REUS W/ TWL LRG LVL3 (GOWN DISPOSABLE) ×1 IMPLANT
GOWN STRL REUS W/ TWL XL LVL3 (GOWN DISPOSABLE) ×1 IMPLANT
GOWN STRL REUS W/TWL LRG LVL3 (GOWN DISPOSABLE) ×1
GOWN STRL REUS W/TWL XL LVL3 (GOWN DISPOSABLE) ×1
KIT BASIN OR (CUSTOM PROCEDURE TRAY) ×1 IMPLANT
KIT TURNOVER KIT B (KITS) ×1 IMPLANT
MESH HERNIA SYS ULTRAPRO LRG (Mesh General) IMPLANT
NDL HYPO 25GX1X1/2 BEV (NEEDLE) ×1 IMPLANT
NEEDLE HYPO 25GX1X1/2 BEV (NEEDLE) ×1 IMPLANT
NS IRRIG 1000ML POUR BTL (IV SOLUTION) ×1 IMPLANT
PACK GENERAL/GYN (CUSTOM PROCEDURE TRAY) ×1 IMPLANT
PAD ARMBOARD 7.5X6 YLW CONV (MISCELLANEOUS) ×1 IMPLANT
PENCIL SMOKE EVACUATOR (MISCELLANEOUS) ×1 IMPLANT
SUT MNCRL AB 4-0 PS2 18 (SUTURE) ×1 IMPLANT
SUT NOVA NAB GS-21 1 T12 (SUTURE) IMPLANT
SUT VIC AB 2-0 SH 18 (SUTURE) IMPLANT
SYR CONTROL 10ML LL (SYRINGE) ×1 IMPLANT
TOWEL GREEN STERILE (TOWEL DISPOSABLE) ×1 IMPLANT
TOWEL GREEN STERILE FF (TOWEL DISPOSABLE) ×1 IMPLANT

## 2022-08-24 NOTE — Anesthesia Procedure Notes (Signed)
Procedure Name: Intubation Date/Time: 08/24/2022 4:03 PM  Performed by: Colin Benton, CRNAPre-anesthesia Checklist: Patient identified, Emergency Drugs available, Suction available and Patient being monitored Patient Re-evaluated:Patient Re-evaluated prior to induction Oxygen Delivery Method: Circle system utilized Preoxygenation: Pre-oxygenation with 100% oxygen Induction Type: IV induction Ventilation: Mask ventilation without difficulty Laryngoscope Size: Miller and 2 Grade View: Grade I Tube type: Oral Tube size: 7.5 mm Number of attempts: 1 Airway Equipment and Method: Stylet Placement Confirmation: ETT inserted through vocal cords under direct vision, positive ETCO2 and breath sounds checked- equal and bilateral Secured at: 23 cm Tube secured with: Tape Dental Injury: Teeth and Oropharynx as per pre-operative assessment

## 2022-08-24 NOTE — Op Note (Signed)
Preoperative diagnosis: Right inguinal hernia reducible initial  Postop diagnosis: Indirect right inguinal hernia reducible initial  Procedure: Repair of right inguinal hernia with mesh  Surgeon: Erroll Luna  Assistant: Dr. Karlene Lineman MD  I was personally present /performed the key and critical portions of this procedure and immediately available throughout the entire procedure, as documented in my operative note.  Anesthesia: General with TAP block on the right and 0.25% Marcaine local  EBL: Minimal  IV fluids: Per anesthesia record  Indications for procedure: Patient presents for repair of his right inguinal hernia.  Open and laparoscopic techniques were reviewed and he opted for open repair of his right inguinal hernia with mesh.The risk of hernia repair include bleeding,  Infection,   Recurrence of the hernia,  Mesh use, chronic pain,  Organ injury,  Bowel injury,  Bladder injury,   nerve injury with numbness around the incision,  Death,  and worsening of preexisting  medical problems.  The alternatives to surgery have been discussed as well..  Long term expectations of both operative and non operative treatments have been discussed.   The patient agrees to proceed.    Description of procedure: The patient was met in the holding area.  Questions were answered.  Right groin was marked as correct site and he underwent a block per anesthesia protocol.  All questions were answered he was then taken back to the operative room placed upon the OR table.  After induction of general esthesia, right groin was prepped and draped in sterile fashion and timeout performed.  He received appropriate preoperative antibiotics.  Local anesthetic was infiltrated.  A 6 cm incision was made in the right groin overlying the inguinal canal.  Dissection was carried down through the subcutaneous tissues and through Scarpa's fascia.  The aponeurosis of the external bleak identified.  Small incision was made with a  scalpel and Metzenbaum scissors were used to open the external bleak through the external ring.  Cord structures were identified and encircled with a cordage Penrose drain.  There was a large lipoma and indirect hernia sac and these were dissected off the cord reduced back into the preperitoneal space.  A large piece of the ultra pro hernia system was used with the inner leaflet placed into the preperitoneal space and the onlay placed onto the floor the anal canal.  A slit was cut for the cord structures.  The onlay was secured to the shelving edge of the inguinal ligament, Cooper's ligament, and the remnants of the conjoined tendon and internal oblique.  The cord exit of the mesh to the small slit cut.  This was then secured on the cord carefully leaving ample room for the cord to exit.  Once the mesh was secured identified the ilioinguinal nerve.  This was tethered of the mesh and was divided as it exited the muscle to prevent entrapment and postoperative pain.  Upon examination there was good closure without tension.  There is no evidence of bleeding.  There is no evidence of constriction of the cord.  We then closed the aponeurosis of the external bleak with 2-0 Vicryl.  3-0 Vicryl was used to approximate Scarpa's fascia and 4 Monocryl was used to close the skin in a subcuticular fashion.  Dermabond was applied.  All counts were found to be correct.  The patient was awoke extubated taken to recovery in satisfactory condition.

## 2022-08-24 NOTE — Discharge Instructions (Signed)
[facility] _______[facility]  UMBILICAL OR INGUINAL HERNIA REPAIR: POST OP INSTRUCTIONS  Always review your discharge instruction sheet given to you by the facility where your surgery was performed. IF YOU HAVE DISABILITY OR FAMILY LEAVE FORMS, YOU MUST BRING THEM TO THE OFFICE FOR PROCESSING.   DO NOT GIVE THEM TO YOUR DOCTOR.  1. A  prescription for pain medication may be given to you upon discharge.  Take your pain medication as prescribed, if needed.  If narcotic pain medicine is not needed, then you may take acetaminophen (Tylenol) or ibuprofen (Advil) as needed. 2. Take your usually prescribed medications unless otherwise directed. If you need a refill on your pain medication, please contact your pharmacy.  They will contact our office to request authorization. Prescriptions will not be filled after 5 pm or on week-ends. 3. You should follow a light diet the first 24 hours after arrival home, such as soup and crackers, etc.  Be sure to include lots of fluids daily.  Resume your normal diet the day after surgery. 4.Most patients will experience some swelling and bruising around the umbilicus or in the groin and scrotum.  Ice packs and reclining will help.  Swelling and bruising can take several days to resolve.  6. It is common to experience some constipation if taking pain medication after surgery.  Increasing fluid intake and taking a stool softener (such as Colace) will usually help or prevent this problem from occurring.  A mild laxative (Milk of Magnesia or Miralax) should be taken according to package directions if there are no bowel movements after 48 hours. 7. Unless discharge instructions indicate otherwise, you may remove your bandages 24-48 hours after surgery, and you may shower at that time.  You may have steri-strips (small skin tapes) in place directly over the incision.  These strips should be left on the skin for 7-10 days.  If your surgeon used skin glue on the  incision, you may shower in 24 hours.  The glue will flake off over the next 2-3 weeks.  Any sutures or staples will be removed at the office during your follow-up visit. 8. ACTIVITIES:  You may resume regular (light) daily activities beginning the next day--such as daily self-care, walking, climbing stairs--gradually increasing activities as tolerated.  You may have sexual intercourse when it is comfortable.  Refrain from any heavy lifting or straining until approved by your doctor.  a.You may drive when you are no longer taking prescription pain medication, you can comfortably wear a seatbelt, and you can safely maneuver your car and apply brakes. b.RETURN TO WORK:   _____________________________________________  9.You should see your doctor in the office for a follow-up appointment approximately 2-3 weeks after your surgery.  Make sure that you call for this appointment within a day or two after you arrive home to insure a convenient appointment time. 10.OTHER INSTRUCTIONS: _________________________    _____________________________________  WHEN TO CALL YOUR DOCTOR: Fever over 101.0 Inability to urinate Nausea and/or vomiting Extreme swelling or bruising Continued bleeding from incision. Increased pain, redness, or drainage from the incision  The clinic staff is available to answer your questions during regular business hours.  Please don't hesitate to call and ask to speak to one of the nurses for clinical concerns.  If you have a medical emergency, go to the nearest emergency room or call 911.  A surgeon from [facility] is always on call at the hospital   [address], [address],   [place], [place]  [address] ?  [address] [address], [place], [place]   [address] [phone] ? [phone] ? FAX [phone] Web site: [email or web address]  

## 2022-08-24 NOTE — H&P (Signed)
History of Present Illness: Joel Lin is a 81 y.o. male who is seen today as an office consultation for evaluation of Hernia .  Patient presents for evaluation of symptomatic right inguinal hernia. Is been present for the last month or so but he thinks it may be longer. He developed some burning pain in his right groin especially when sitting or standing for prolonged periods of time. Also he has noticed more of a bulge in his right groin. No change to bowel or bladder function. Denies chest pain or shortness of breath and actually has been feeling quite well of late. Has extensive cardiac history though.   Review of Systems: A complete review of systems was obtained from the patient. I have reviewed this information and discussed as appropriate with the patient. See HPI as well for other ROS.    Medical History: Past Medical History:  Diagnosis Date  Anemia  Arrhythmia  GERD (gastroesophageal reflux disease)  Hyperlipidemia   There is no problem list on file for this patient.  Past Surgical History:  Procedure Laterality Date  ABDOMINAL AORTIC ANEURYSM REPAIR  CHOLECYSTECTOMY  INSERTION SINGLE CHAMBER PACEMAKER GENERATOR    Allergies  Allergen Reactions  Epinephrine Other (See Comments)  Elevated heartbeat  Losartan Other (See Comments) and Palpitations  Irregular heartbeat  Clopidogrel Other (See Comments) and Hives  Iodine Hives  "Renografin"   Current Outpatient Medications on File Prior to Visit  Medication Sig Dispense Refill  lisinopriL (ZESTRIL) 2.5 MG tablet 1 tablet  multivitamin with minerals tablet Take 1 tablet by mouth once daily  PRALUENT PEN 75 mg/mL pen injector INJECT 1 PEN INTO THE SKIN EVERY 14 (FOURTEEN) DAYS.  rosuvastatin (CRESTOR) 5 MG tablet Take 5 mg by mouth once daily  sotaloL (BETAPACE) 80 MG tablet Take 1 tablet by mouth 2 (two) times daily  spironolactone (ALDACTONE) 25 MG tablet TAKE 1/2 TABLET DAILY   No current  facility-administered medications on file prior to visit.   Family History  Problem Relation Age of Onset  Hyperlipidemia (Elevated cholesterol) Mother  Hyperlipidemia (Elevated cholesterol) Father  Myocardial Infarction (Heart attack) Father    Social History   Tobacco Use  Smoking Status Former  Types: Cigarettes  Quit date: 1981  Years since quitting: 42.6  Smokeless Tobacco Never    Social History   Socioeconomic History  Marital status: Married  Tobacco Use  Smoking status: Former  Types: Cigarettes  Quit date: 1981  Years since quitting: 42.6  Smokeless tobacco: Never  Substance and Sexual Activity  Alcohol use: Never  Drug use: Never   Objective:   Vitals:  07/25/22 1454  BP: 122/68  Pulse: 69  Weight: 74 kg (163 lb 3.2 oz)  Height: 172.7 cm ('5\' 8"'$ )   Body mass index is 24.81 kg/m.  Physical Exam HENT:  Head: Normocephalic.  Cardiovascular:  Rate and Rhythm: Normal rate.  Comments: Defibrillator right upper chest  Pulmonary:  Effort: Pulmonary effort is normal.  Breath sounds: No stridor.  Abdominal:  Palpations: Abdomen is soft.  Tenderness: There is no abdominal tenderness.  Hernia: A hernia is present. Hernia is present in the right inguinal area. There is no hernia in the umbilical area or left inguinal area.  Skin: General: Skin is warm.  Neurological:  General: No focal deficit present.  Mental Status: He is alert.    Assessment and Plan:   Diagnoses and all orders for this visit:  Right inguinal hernia   Discussed open repair of his right inguinal  hernia with mesh. Discussed risks and benefits of hernia surgery. Discussed risk of bleeding, infection, major vascular injury, nerve injury, organ injury, exacerbation of underlying medical problems, cardiac event, the need for hospitalization or treatments. Discussed risk of recurrence and use of mesh and chronic pain. Discussed other techniques of repair to include laparoscopic and  observation. Given that he is having symptoms and is impacting his quality of life, he wishes to proceed with repair. Obtain cardiac clearance.  No follow-ups on file.  Kennieth Francois, MD

## 2022-08-24 NOTE — Anesthesia Procedure Notes (Signed)
Anesthesia Regional Block: TAP block   Pre-Anesthetic Checklist: , timeout performed,  Correct Patient, Correct Site, Correct Laterality,  Correct Procedure, Correct Position, site marked,  Risks and benefits discussed,  Surgical consent,  Pre-op evaluation,  At surgeon's request and post-op pain management  Laterality: Right  Prep: Maximum Sterile Barrier Precautions used, chloraprep       Needles:  Injection technique: Single-shot  Needle Type: Echogenic Stimulator Needle     Needle Length: 9cm  Needle Gauge: 22     Additional Needles:   Procedures:,,,, ultrasound used (permanent image in chart),,    Narrative:  Start time: 08/24/2022 3:10 PM End time: 08/24/2022 3:15 PM Injection made incrementally with aspirations every 5 mL.  Performed by: Personally  Anesthesiologist: Pervis Hocking, DO  Additional Notes: Monitors applied. No increased pain on injection. No increased resistance to injection. Injection made in 5cc increments. Good needle visualization. Patient tolerated procedure well.

## 2022-08-24 NOTE — Interval H&P Note (Signed)
History and Physical Interval Note:  08/24/2022 3:44 PM  Arthor Captain  has presented today for surgery, with the diagnosis of RIGHT INGUINAL HERNIA.  The various methods of treatment have been discussed with the patient and family. After consideration of risks, benefits and other options for treatment, the patient has consented to  Procedure(s) with comments: [place] (Right) - GEN & TAP BLOCK as a surgical intervention.  The patient's history has been reviewed, patient examined, no change in status, stable for surgery.  I have reviewed the patient's chart and labs.  Questions were answered to the patient's satisfaction.    The risk of hernia repair include bleeding,  Infection,   Recurrence of the hernia,  Mesh use, chronic pain,  Organ injury,  Bowel injury,  Bladder injury,   nerve injury with numbness around the incision,  Death,  and worsening of preexisting  medical problems.  The alternatives to surgery have been discussed as well..  Long term expectations of both operative and non operative treatments have been discussed.   The patient agrees to proceed.  Turner Daniels MD

## 2022-08-25 ENCOUNTER — Encounter (HOSPITAL_COMMUNITY): Payer: Self-pay | Admitting: Surgery

## 2022-08-25 NOTE — Transfer of Care (Signed)
Immediate Anesthesia Transfer of Care Note  Patient: Joel Lin  Procedure(s) Performed: RIGHT INGUINAL HERNIA REPAIR (Right: Groin) INSERTION OF MESH (Right: Groin)  Patient Location: PACU  Anesthesia Type:General  Level of Consciousness: drowsy, patient cooperative and responds to stimulation  Airway & Oxygen Therapy: Patient Spontanous Breathing  Post-op Assessment: Report given to RN and Post -op Vital signs reviewed and stable  Post vital signs: Reviewed and stable  Last Vitals:  Vitals Value Taken Time  BP 142/77 08/24/22 1800  Temp 36.7 C 08/24/22 1800  Pulse 50 08/24/22 1807  Resp 15 08/24/22 1807  SpO2 96 % 08/24/22 1807  Vitals shown include unvalidated device data.  Last Pain:  Vitals:   08/24/22 1745  PainSc: 4          Complications: No notable events documented.

## 2022-08-25 NOTE — Anesthesia Postprocedure Evaluation (Signed)
Anesthesia Post Note  Patient: Joel Lin  Procedure(s) Performed: RIGHT INGUINAL HERNIA REPAIR (Right: Groin) INSERTION OF MESH (Right: Groin)     Patient location during evaluation: PACU Anesthesia Type: Regional and General Level of consciousness: awake and alert Pain management: pain level controlled Vital Signs Assessment: post-procedure vital signs reviewed and stable Respiratory status: spontaneous breathing, nonlabored ventilation, respiratory function stable and patient connected to nasal cannula oxygen Cardiovascular status: blood pressure returned to baseline and stable Postop Assessment: no apparent nausea or vomiting Anesthetic complications: no   No notable events documented.              Effie Berkshire

## 2022-09-02 NOTE — Progress Notes (Signed)
Remote ICD transmission.   

## 2022-09-05 ENCOUNTER — Telehealth: Payer: Self-pay | Admitting: Internal Medicine

## 2022-09-05 NOTE — Telephone Encounter (Signed)
Pt c/o medication issue:  1. Name of Medication: Alirocumab ([place]) 75 MG/ML SOAJ   2. How are you currently taking this medication (dosage and times per day)?  Route: Inject 1 pen into the skin every 14 (fourteen) days. - Subcutaneous  3. Are you having a reaction (difficulty breathing--STAT)?   4. What is your medication issue? Pt calling stating his patient assistance for this medication is about to expire

## 2022-09-06 NOTE — Telephone Encounter (Signed)
Pt calling for an update. Informed him message was sent over to pharm d yesterday

## 2022-09-06 NOTE — Telephone Encounter (Signed)
[place] will expire 10/7. Fund closed and can't renew grant. Called pt to let him know. Advised he can go to [email or web address] and sign up for alerts when the fund reopens or he can check back with [place].

## 2022-09-07 DIAGNOSIS — H52203 Unspecified astigmatism, bilateral: Secondary | ICD-10-CM | POA: Diagnosis not present

## 2022-09-07 DIAGNOSIS — H1789 Other corneal scars and opacities: Secondary | ICD-10-CM | POA: Diagnosis not present

## 2022-09-07 DIAGNOSIS — H2513 Age-related nuclear cataract, bilateral: Secondary | ICD-10-CM | POA: Diagnosis not present

## 2022-09-12 NOTE — Telephone Encounter (Signed)
Patient called stating he is filling out application for Pt assistance through drug company. He needs copy of PA letter. I advised we have. Patient to drop off competed application and we will get MD to sign and then fax off.

## 2022-09-16 NOTE — Telephone Encounter (Signed)
Patient assistance forms faxed to [employer].

## 2022-09-19 NOTE — Telephone Encounter (Signed)
Patient approved for pt assistance for Praluent though the end of the year.

## 2022-10-01 ENCOUNTER — Other Ambulatory Visit: Payer: Self-pay | Admitting: Interventional Cardiology

## 2022-10-01 DIAGNOSIS — I2510 Atherosclerotic heart disease of native coronary artery without angina pectoris: Secondary | ICD-10-CM

## 2022-10-01 DIAGNOSIS — E782 Mixed hyperlipidemia: Secondary | ICD-10-CM

## 2022-10-07 DIAGNOSIS — Z0100 Encounter for examination of eyes and vision without abnormal findings: Secondary | ICD-10-CM | POA: Diagnosis not present

## 2022-10-13 ENCOUNTER — Other Ambulatory Visit: Payer: Self-pay | Admitting: Internal Medicine

## 2022-11-08 ENCOUNTER — Ambulatory Visit (INDEPENDENT_AMBULATORY_CARE_PROVIDER_SITE_OTHER): Payer: Medicare HMO

## 2022-11-08 DIAGNOSIS — I255 Ischemic cardiomyopathy: Secondary | ICD-10-CM

## 2022-11-08 DIAGNOSIS — Z95810 Presence of automatic (implantable) cardiac defibrillator: Secondary | ICD-10-CM | POA: Diagnosis not present

## 2022-11-08 LAB — CUP PACEART REMOTE DEVICE CHECK
Battery Remaining Longevity: 40 mo
Battery Remaining Percentage: 42 %
Battery Voltage: 2.89 V
Brady Statistic AP VP Percent: 23 %
Brady Statistic AP VS Percent: 25 %
Brady Statistic AS VP Percent: 14 %
Brady Statistic AS VS Percent: 38 %
Brady Statistic RA Percent Paced: 46 %
Brady Statistic RV Percent Paced: 37 %
Date Time Interrogation Session: 20231128020016
HighPow Impedance: 43 Ohm
HighPow Impedance: 43 Ohm
Implantable Lead Connection Status: 753985
Implantable Lead Connection Status: 753985
Implantable Lead Implant Date: 20090427
Implantable Lead Implant Date: 20180305
Implantable Lead Location: 753859
Implantable Lead Location: 753860
Implantable Lead Model: 7121
Implantable Pulse Generator Implant Date: 20180305
Lead Channel Impedance Value: 300 Ohm
Lead Channel Impedance Value: 400 Ohm
Lead Channel Pacing Threshold Amplitude: 0.5 V
Lead Channel Pacing Threshold Amplitude: 1.5 V
Lead Channel Pacing Threshold Pulse Width: 0.5 ms
Lead Channel Pacing Threshold Pulse Width: 0.5 ms
Lead Channel Sensing Intrinsic Amplitude: 10.9 mV
Lead Channel Sensing Intrinsic Amplitude: 3 mV
Lead Channel Setting Pacing Amplitude: 1.5 V
Lead Channel Setting Pacing Amplitude: 1.75 V
Lead Channel Setting Pacing Pulse Width: 0.5 ms
Lead Channel Setting Sensing Sensitivity: 0.5 mV
Pulse Gen Serial Number: 1247854

## 2022-11-09 DIAGNOSIS — I255 Ischemic cardiomyopathy: Secondary | ICD-10-CM | POA: Diagnosis not present

## 2022-11-09 DIAGNOSIS — E7800 Pure hypercholesterolemia, unspecified: Secondary | ICD-10-CM | POA: Diagnosis not present

## 2022-11-09 DIAGNOSIS — I2510 Atherosclerotic heart disease of native coronary artery without angina pectoris: Secondary | ICD-10-CM | POA: Diagnosis not present

## 2022-11-09 DIAGNOSIS — Z95810 Presence of automatic (implantable) cardiac defibrillator: Secondary | ICD-10-CM | POA: Diagnosis not present

## 2022-11-09 DIAGNOSIS — I10 Essential (primary) hypertension: Secondary | ICD-10-CM | POA: Diagnosis not present

## 2022-11-09 DIAGNOSIS — Z23 Encounter for immunization: Secondary | ICD-10-CM | POA: Diagnosis not present

## 2022-11-09 DIAGNOSIS — G4700 Insomnia, unspecified: Secondary | ICD-10-CM | POA: Diagnosis not present

## 2022-11-09 DIAGNOSIS — J309 Allergic rhinitis, unspecified: Secondary | ICD-10-CM | POA: Diagnosis not present

## 2022-11-10 ENCOUNTER — Other Ambulatory Visit: Payer: Self-pay | Admitting: Internal Medicine

## 2022-11-10 ENCOUNTER — Other Ambulatory Visit: Payer: Self-pay | Admitting: Interventional Cardiology

## 2022-11-16 ENCOUNTER — Telehealth: Payer: Self-pay | Admitting: Pharmacist

## 2022-11-16 NOTE — Telephone Encounter (Signed)
Pt called clinic, reports his [facility] insurance will not cover Praluent next year and will cover Repatha instead. Advised pt that after Jan 1, we'll submit a prior auth for Repatha. He's worried about the cost (currently getting Praluent from pt assistance program which is also stopping next year). Advised him once prior auth for Repatha is approved in January, we'll send rx to pharmacy to determine cost and let him know, but that with most Medicare plans the copay is around $45/month unless he has a deductible to meet. He was appreciative for the call.

## 2022-11-21 NOTE — Progress Notes (Unsigned)
Cardiology Office Note   Date:  11/22/2022   ID:  Joel Lin, DOB [birth year], MRN [id number]  PCP:  [name], MD    No chief complaint on file.  CAD  Wt Readings from Last 3 Encounters:  11/22/22 165 lb (74.8 kg)  08/24/22 162 lb (73.5 kg)  08/17/22 162 lb (73.5 kg)       History of Present Illness: Joel Lin is a 81 y.o. male  who has a history of known ischemic heart disease. He has a history of a remote myocardial infarction at the age of 53. That was a large posterolateral myocardial infarction in 1981 in [place].  He moved to [place] in 1985.  The patient had angioplasty and stent of his LAD in 1999- Dr. Acie Fredrickson. In 2009 he had an out of hospital cardiac arrest at home was successfully resuscitated and treated with hypothermia without significant neurologic sequelae. No CAD by cath at that time.  He now has a defibrillator in place and is followed by Dr. Caryl Comes. He has a history of hypercholesterolemia. His last nuclear stress test was 10/24/11 and showed an ejection fraction of 45% and a large posterolateral scar but no ischemia. His last echocardiogram was in 2014 and showed an ejection fraction of 40%. On 12/06/13 the patient underwent a stent graft for his abdominal aortic aneurysm. The patient did well and was discharged without complications. The patient has his defibrillator monitored through Dr. Olin Pia office. In January 2013 he had 2 episodes of ventricular tachycardia which were asymptomatic. In March 2014 he was rehospitalized after having 3 consecutive shocks from his defibrillator. He underwent cardiac catheterization by Dr. Burt Knack on 02/28/13 who told him that his arteries have not shown any progression of atherosclerosis since his previous cardiac catheterization in 2009.     On 02/10/15 the patient presented to [facility] ED due to ICD shock.  He stated he was playing golf and just finished 9 holes and was starting on the back 9 when he suddenly felt  dizzy. The next thing he knows his device shocked him. He called 911 and went back to the clubhouse where EMS arrived and gave him four aspirin.   Pt was transferred to [facility] for EP eval.  He did well overnight without any ICD shocks.  Troponin was negative.  He was seen by Dr. Caryl Comes and found stable for discharge.  He had been placed on po amiodarone but Dr. Caryl Comes held on initiating amiodarone given relative infrequency of V tach.    AAA repair in 12/14, stent graft with Dr. Donnetta Hutching.   He was hospitalizated following a ICD discharge was on 06/24/15.  During that admission his atenolol was stopped and he was switched to sotalol initially 80 mg twice a day and subsequently increased to 120 mg twice a day.  More recently the dose was decreased again to 80 mg twice a day.     He had an AICD changeout in 3/18 with addition of an atrial lead.   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   Step-mom passed away from [place] in [place].  He stopped his brand name Zetia due to cost. He stayed on his Crestor QOD- dose limited by muscle pain. He has tried several statins in the past including Baychol. Failed Zocor/Lipitor.   Had hernia repair in 2023.  Increasing exercise again.  Doing weight watchers to avoid weight gain.   Lipids were well controlled in Nov were well controlled. Needs to  switch to Repatha.  Seen Melissa in lipid clinic.   Past Medical History:  Diagnosis Date   AAA (abdominal aortic aneurysm) ([place])    AICD (automatic cardioverter/defibrillator) present    Arthritis    wrist   Cardiac arrest ([place]) 03/29/2008   a. resuscitated OOH VF arrest  b. s/p STJ ICD   Contrast media allergy    Coronary artery disease    myoview 2011>>EF of 53% / large area of old inferolateral infarct but no reversible ischemia           Hemorrhoid    Hypercoagulable state ([place])    Questionable history of a borderline hypercoagulable state   Hyperlipidemia    Myocardial infarction ([place]) 12/13/1979    Peripheral vascular disease ([place])    pt denies this   Pneumonia    years ago   PVCs (premature ventricular contractions)    occasionally   Ventricular tachycardia ([place])    a. appropriate ICD therpay b. on Sotalol    Past Surgical History:  Procedure Laterality Date   ABDOMINAL AORTIC ENDOVASCULAR STENT GRAFT N/A 12/06/2013   Procedure: ABDOMINAL AORTIC ENDOVASCULAR STENT GRAFT;  Surgeon: Rosetta Posner, MD;  Location: [facility];  Service: Vascular;  Laterality: N/A;   CARDIAC CATHETERIZATION  04/04/2008    LAD stent patent, D1 OK, OM1 90/80/80% (38m vessel), RCA 30%; EF 35%.   CARDIAC DEFIBRILLATOR PLACEMENT  04/07/2008    STJ single chamber ICD implanted for secondary prevention   CHOLECYSTECTOMY     CHOLECYSTECTOMY, LAPAROSCOPIC  08/13/2009   Calculous cholecystitis   CORONARY ANGIOPLASTY WITH STENT PLACEMENT  1999   PTCA and stenting of his left anterior descending artery   ICD REVISION N/A 02/13/2017   Procedure: Upgrade to Dual Chamber ICD;  Surgeon: Will MMeredith Leeds MD;  [facility];  Service: Cardiovascular;  Laterality: N/A;   INGUINAL HERNIA REPAIR Right 08/24/2022   Procedure: RIGHT INGUINAL HERNIA REPAIR;  Surgeon: CErroll Luna MD;  [facility]  Service: General;  Laterality: Right;  GEN & TAP BLOCK   INSERTION OF MESH Right 08/24/2022   Procedure: INSERTION OF MESH;  Surgeon: CErroll Luna MD;  [place] [facility]  Service: General;  Laterality: Right;   LEFT HEART CATHETERIZATION WITH CORONARY ANGIOGRAM N/A 02/28/2013   Procedure: LEFT HEART CATHETERIZATION WITH CORONARY ANGIOGRAM;  Surgeon: MSherren Mocha MD;  [facility];  Service: Cardiovascular;  Laterality: N/A;   TONSILLECTOMY       Current Outpatient Medications  Medication Sig Dispense Refill   Alirocumab (PRALUENT) 75 MG/ML SOAJ INJECT 1 PEN INTO THE SKIN EVERY 14 (FOURTEEN) DAYS. 2 mL 11   aspirin 81 MG tablet Take 81 mg by mouth every evening.     cholecalciferol (VITAMIN D)  1000 units tablet Take 1,000 Units by mouth daily.     fexofenadine (ALLEGRA) 180 MG tablet Take 180 mg by mouth daily.     fish oil-omega-3 fatty acids 1000 MG capsule Take 1 g by mouth daily.      fluticasone (FLONASE) 50 MCG/ACT nasal spray Place 1 spray into both nostrils daily as needed for allergies or rhinitis.     Glucosamine Sulfate-MSM (GLUCOSAMINE-MSM DS PO) Take 1 tablet by mouth daily.     ibuprofen (ADVIL,MOTRIN) 200 MG tablet Take 400 mg by mouth every 6 (six) hours as needed for moderate pain.     lisinopril (ZESTRIL) 2.5 MG tablet TAKE 1 TABLET DAILY (DOSE  DECREASE) 90 tablet 0   Melatonin 5  MG CAPS Take 5 mg by mouth at bedtime as needed (sleep).     Multiple Vitamin (MULTIVITAMIN WITH MINERALS) TABS Take 1 tablet by mouth daily.     nitroGLYCERIN (NITRODUR - DOSED IN MG/24 HR) 0.2 mg/hr patch APPLY 1 PATCH ONTO THE SKINDAILY 90 patch 2   nitroGLYCERIN (NITROSTAT) 0.4 MG SL tablet Place 1 tablet (0.4 mg total) under the tongue every 5 (five) minutes as needed for chest pain ((MAX 3 DOSES)). 25 tablet 3   oxyCODONE (OXY IR/ROXICODONE) 5 MG immediate release tablet Take 1 tablet (5 mg total) by mouth every 6 (six) hours as needed for severe pain. 15 tablet 0   polyethylene glycol (MIRALAX / GLYCOLAX) 17 g packet Take 17 g by mouth daily as needed for moderate constipation.     rosuvastatin (CRESTOR) 5 MG tablet TAKE 1 TABLET EVERY OTHER  DAY 45 tablet 3   sotalol (BETAPACE) 80 MG tablet Take 1 tablet (80 mg total) by mouth 2 (two) times daily. 180 tablet 1   spironolactone (ALDACTONE) 25 MG tablet TAKE 1/2 TABLET DAILY 45 tablet 2   traMADol (ULTRAM) 50 MG tablet Take 1 tablet (50 mg total) by mouth every 8 (eight) hours as needed for up to 12 doses for severe pain. 12 tablet 0   No current facility-administered medications for this visit.    Allergies:   Epinephrine, Losartan, Metoprolol, Mydriacyl [tropicamide], Phenylephrine hcl (pressors), Bisoprolol, Iodinated contrast  media, Clopidogrel bisulfate, Iodine, Other, and Statins    Social History:  The patient  reports that he quit smoking about 42 years ago. His smoking use included cigarettes. He has a 20.00 pack-year smoking history. He has never used smokeless tobacco. He reports that he does not drink alcohol and does not use drugs.   Family History:  The patient's family history includes Heart disease in his father and mother; Hyperlipidemia in his father and mother; Mitral valve prolapse in his sister; Stomach cancer in an other family member.    ROS:  Please see the history of present illness.   Otherwise, review of systems are positive for groin pain, improving.   All other systems are reviewed and negative.    PHYSICAL EXAM: VS:  BP 130/68   Pulse 63   Ht '5\' 8"'$  (1.727 m)   Wt 165 lb (74.8 kg)   SpO2 97%   BMI 25.09 kg/m  , BMI Body mass index is 25.09 kg/m. GEN: Well nourished, well developed, in no acute distress HEENT: normal Neck: no JVD, carotid bruits, or masses Cardiac: RRR; no murmurs, rubs, or gallops,no edema  Respiratory:  clear to auscultation bilaterally, normal work of breathing GI: soft, nontender, nondistended, + BS MS: no deformity or atrophy Skin: warm and dry, no rash Neuro:  Strength and sensation are intact Psych: euthymic mood, full affect   EKG:   The ekg ordered 7/23 demonstrates sinus bradycardia, prolonged PR, NSST   Recent Labs: 05/13/2022: Magnesium 2.3 08/17/2022: BUN 23; Creatinine, Ser 0.90; Hemoglobin 14.4; Platelets 118; Potassium 4.9; Sodium 137   Lipid Panel    Component Value Date/Time   CHOL 103 03/24/2021 0821   TRIG 76 03/24/2021 0821   HDL 42 03/24/2021 0821   CHOLHDL 2.5 03/24/2021 0821   CHOLHDL 3.8 05/27/2016 0939   VLDL 33 (H) 05/27/2016 0939   LDLCALC 45 03/24/2021 0821   LDLDIRECT 80.0 08/12/2015 0806     Other studies Reviewed: Additional studies/ records that were reviewed today with results demonstrating: labs reviewed- LDL 32  in 11/23.   ASSESSMENT AND PLAN:  CAD/old MI: No angina on medical therapy.  Continue aggressive secondary prevention.  Refill SL NTG.  Chronic systolic heart failure: Appears euvolemic.  AAA: Status post EVAR.  Followed by Dr. Donnetta Hutching. Hyperlipidemia: Every other day Crestor.  Zetia and WelChol have been expensive.  He was referred to lipid clinic to see about PCSK9 inhibitor. AICD: Has had a shock in the past many years ago.  Sotalol was started.  No AICD firing.  Felt a "phantom shock."  Had ER visit.    Current medicines are reviewed at length with the patient today.  The patient concerns regarding his medicines were addressed.  The following changes have been made:  No change  Labs/ tests ordered today include:  No orders of the defined types were placed in this encounter.   Recommend 150 minutes/week of aerobic exercise Low fat, low carb, high fiber diet recommended  Disposition:   FU in 1 year   Signed, Larae Grooms, MD  11/22/2022 10:43 AM    [facility] [address], [place], [place]  [address] Phone: [phone]; Fax: [phone]

## 2022-11-22 ENCOUNTER — Encounter: Payer: Self-pay | Admitting: Interventional Cardiology

## 2022-11-22 ENCOUNTER — Ambulatory Visit: Payer: Medicare HMO | Attending: Interventional Cardiology | Admitting: Interventional Cardiology

## 2022-11-22 VITALS — BP 130/68 | HR 63 | Ht 68.0 in | Wt 165.0 lb

## 2022-11-22 DIAGNOSIS — E782 Mixed hyperlipidemia: Secondary | ICD-10-CM | POA: Diagnosis not present

## 2022-11-22 DIAGNOSIS — Z95810 Presence of automatic (implantable) cardiac defibrillator: Secondary | ICD-10-CM | POA: Diagnosis not present

## 2022-11-22 DIAGNOSIS — I25118 Atherosclerotic heart disease of native coronary artery with other forms of angina pectoris: Secondary | ICD-10-CM | POA: Diagnosis not present

## 2022-11-22 MED ORDER — NITROGLYCERIN 0.4 MG SL SUBL: 0.4000 mg | SUBLINGUAL_TABLET | SUBLINGUAL | 3 refills | Status: DC | PRN

## 2022-11-22 MED ORDER — NITROGLYCERIN 0.4 MG SL SUBL: 0.4000 mg | SUBLINGUAL_TABLET | SUBLINGUAL | 6 refills | Status: DC | PRN

## 2022-11-22 NOTE — Patient Instructions (Signed)
Medication Instructions:  Your physician recommends that you continue on your current medications as directed. Please refer to the Current Medication list given to you today.  *If you need a refill on your cardiac medications before your next appointment, please call your pharmacy*   Lab Work: none If you have labs (blood work) drawn today and your tests are completely normal, you will receive your results only by: MyChart Message (if you have MyChart) OR A paper copy in the mail If you have any lab test that is abnormal or we need to change your treatment, we will call you to review the results.   Testing/Procedures: none   Follow-Up: At [facility], you and your health needs are our priority.  As part of our continuing mission to provide you with exceptional heart care, we have created designated Provider Care Teams.  These Care Teams include your primary Cardiologist (physician) and Advanced Practice Providers (APPs -  Physician Assistants and Nurse Practitioners) who all work together to provide you with the care you need, when you need it.  We recommend signing up for the patient portal called "MyChart".  Sign up information is provided on this After Visit Summary.  MyChart is used to connect with patients for Virtual Visits (Telemedicine).  Patients are able to view lab/test results, encounter notes, upcoming appointments, etc.  Non-urgent messages can be sent to your provider as well.   To learn more about what you can do with MyChart, go to [email or web address].    Your next appointment:   12 month(s)  The format for your next appointment:   In Person  Provider:   Jayadeep Varanasi, MD     Other Instructions    Important Information About Sugar       

## 2022-12-06 ENCOUNTER — Other Ambulatory Visit: Payer: Self-pay | Admitting: Internal Medicine

## 2022-12-07 NOTE — Progress Notes (Signed)
Remote ICD transmission.   

## 2022-12-09 DIAGNOSIS — Z03818 Encounter for observation for suspected exposure to other biological agents ruled out: Secondary | ICD-10-CM | POA: Diagnosis not present

## 2022-12-09 DIAGNOSIS — J069 Acute upper respiratory infection, unspecified: Secondary | ICD-10-CM | POA: Diagnosis not present

## 2022-12-13 ENCOUNTER — Telehealth: Payer: Self-pay | Admitting: Pharmacist

## 2022-12-13 MED ORDER — REPATHA SURECLICK 140 MG/ML ~~LOC~~ SOAJ: 1.0000 | SUBCUTANEOUS | 3 refills | Status: DC

## 2022-12-13 NOTE — Telephone Encounter (Signed)
Pt's formulary changing from [place] to [place] for 2024 year. Prior auth submitted for Repatha and approved through 12/12/23. Pt aware and appreciative for the assistance. He'll let us know if copay is cost prohibitive, can pursue [employer] if needed.

## 2022-12-14 ENCOUNTER — Other Ambulatory Visit: Payer: Self-pay

## 2022-12-14 DIAGNOSIS — I714 Abdominal aortic aneurysm, without rupture, unspecified: Secondary | ICD-10-CM

## 2022-12-19 MED ORDER — REPATHA SURECLICK 140 MG/ML ~~LOC~~ SOAJ: 1.0000 | SUBCUTANEOUS | 3 refills | Status: DC

## 2022-12-19 NOTE — Addendum Note (Signed)
Addended by: Marcelle Overlie D on: 12/19/2022 02:39 PM   Modules accepted: Orders

## 2022-12-19 NOTE — Telephone Encounter (Signed)
Patient called and left a VM. Mail order cannot fill [place]. Asked [facility] rx [facility]. Rx sent and pt made aware.

## 2022-12-21 ENCOUNTER — Telehealth: Payer: Self-pay | Admitting: Interventional Cardiology

## 2022-12-21 ENCOUNTER — Other Ambulatory Visit: Payer: Medicare HMO

## 2022-12-21 ENCOUNTER — Encounter: Payer: Self-pay | Admitting: Vascular Surgery

## 2022-12-21 ENCOUNTER — Ambulatory Visit: Payer: Medicare HMO | Admitting: Vascular Surgery

## 2022-12-21 VITALS — BP 157/82 | HR 51 | Temp 97.5°F | Ht 68.0 in | Wt 166.2 lb

## 2022-12-21 DIAGNOSIS — Z8679 Personal history of other diseases of the circulatory system: Secondary | ICD-10-CM | POA: Diagnosis not present

## 2022-12-21 DIAGNOSIS — Z9889 Other specified postprocedural states: Secondary | ICD-10-CM | POA: Diagnosis not present

## 2022-12-21 NOTE — Telephone Encounter (Signed)
Prior auth already submitted on 12/13/22 and approved through 12/12/23, not sure why pharmacy can't process rx. Called to advise them he already has active PA on file. They were trying to process rx through wrong insurance. Confirmed rx going through now and they will order for tomorrow for pt.

## 2022-12-21 NOTE — Progress Notes (Signed)
Vascular and Vein Specialist of [place]  Patient name: Joel Lin MRN: [id number] DOB: 10/18/1941 Sex: male  REASON FOR VISIT: Follow-up endovascular repair of abdominal aortic aneurysm  HPI: Joel Lin is a 82 y.o. male here today for follow-up.  He is here with his wife.  He is status post stent graft repair of a 5.4 cm infrarenal abdominal aortic aneurysm by myself on 12/06/2013  He had not been contacted for office for repeat ultrasound follow-up and so is been several years.  He remains in good health.  Does have significant cardiac issues but recent events.  No symptoms referable to his aneurysm  Past Medical History:  Diagnosis Date   AAA (abdominal aortic aneurysm) (H[facility])    AICD (automatic cardioverter/defibrillator) present    Arthritis    wrist   Cardiac arrest ([place]) 03/29/2008   a. resuscitated OOH VF arrest  b. s/p STJ ICD   Contrast media allergy    Coronary artery disease    myoview 2011>>EF of 53% / large area of old inferolateral infarct but no reversible ischemia           Hemorrhoid    Hypercoagulable state ([place])    Questionable history of a borderline hypercoagulable state   Hyperlipidemia    Myocardial infarction ([place]) 12/13/1979   Peripheral vascular disease ([place])    pt denies this   Pneumonia    years ago   PVCs (premature ventricular contractions)    occasionally   Ventricular tachycardia ([place])    a. appropriate ICD therpay b. on Sotalol    Family History  Problem Relation Age of Onset   Hyperlipidemia Mother    Heart disease Mother    Heart disease Father        before age 32   Hyperlipidemia Father    Mitral valve prolapse Sister    Stomach cancer Other    Heart attack Neg Hx    Hypertension Neg Hx     SOCIAL HISTORY: Social History   Tobacco Use   Smoking status: Former    Packs/day: 1.00    Years: 20.00    Total pack years: 20.00    Types: Cigarettes    Quit date: 12/23/1979     Years since quitting: 43.0   Smokeless tobacco: Never  Substance Use Topics   Alcohol use: No    Alcohol/week: 0.0 standard drinks of alcohol    Allergies  Allergen Reactions   Epinephrine Other (See Comments)    Elevated heartbeat   Losartan Palpitations and Other (See Comments)    Irregular heartbeat   Metoprolol Other (See Comments)    Irregular heartbeat   Mydriacyl [Tropicamide] Other (See Comments)    Elevated heartbeat   Phenylephrine Hcl (Pressors) Other (See Comments)    Increased heartrate   Bisoprolol Other (See Comments)    Feels it does not agree with him   Iodinated Contrast Media Hives   Clopidogrel Bisulfate Hives        Iodine Hives    "Renografin"   Other Other (See Comments)    Muscle cramps (crestor low dose is ok)   Statins Other (See Comments)    Muscle cramps (crestor low dose is ok)    Current Outpatient Medications  Medication Sig Dispense Refill   aspirin 81 MG tablet Take 81 mg by mouth every evening.     cholecalciferol (VITAMIN D) 1000 units tablet Take 1,000 Units by mouth daily.     Evolocumab (REPATHA SURECLICK) 563  MG/ML SOAJ Inject 140 mg into the skin every 14 (fourteen) days. 6 mL 3   fexofenadine (ALLEGRA) 180 MG tablet Take 180 mg by mouth daily.     fish oil-omega-3 fatty acids 1000 MG capsule Take 1 g by mouth daily.      fluticasone (FLONASE) 50 MCG/ACT nasal spray Place 1 spray into both nostrils daily as needed for allergies or rhinitis.     Glucosamine Sulfate-MSM (GLUCOSAMINE-MSM DS PO) Take 1 tablet by mouth daily.     ibuprofen (ADVIL,MOTRIN) 200 MG tablet Take 400 mg by mouth every 6 (six) hours as needed for moderate pain.     lisinopril (ZESTRIL) 2.5 MG tablet TAKE 1 TABLET DAILY (DOSE  DECREASE) 90 tablet 0   Melatonin 5 MG CAPS Take 5 mg by mouth at bedtime as needed (sleep).     Multiple Vitamin (MULTIVITAMIN WITH MINERALS) TABS Take 1 tablet by mouth daily.     nitroGLYCERIN (NITRODUR - DOSED IN MG/24 HR) 0.2  mg/hr patch APPLY 1 PATCH ONTO THE SKINDAILY 90 patch 2   nitroGLYCERIN (NITROSTAT) 0.4 MG SL tablet Place 1 tablet (0.4 mg total) under the tongue every 5 (five) minutes as needed for chest pain ((MAX 3 DOSES)). 25 tablet 6   oxyCODONE (OXY IR/ROXICODONE) 5 MG immediate release tablet Take 1 tablet (5 mg total) by mouth every 6 (six) hours as needed for severe pain. 15 tablet 0   polyethylene glycol (MIRALAX / GLYCOLAX) 17 g packet Take 17 g by mouth daily as needed for moderate constipation.     rosuvastatin (CRESTOR) 5 MG tablet TAKE 1 TABLET EVERY OTHER  DAY 45 tablet 3   sotalol (BETAPACE) 80 MG tablet Take 1 tablet (80 mg total) by mouth 2 (two) times daily. 180 tablet 1   spironolactone (ALDACTONE) 25 MG tablet Take 0.5 tablets (12.5 mg total) by mouth daily. 45 tablet 2   traMADol (ULTRAM) 50 MG tablet Take 1 tablet (50 mg total) by mouth every 8 (eight) hours as needed for up to 12 doses for severe pain. (Patient not taking: Reported on 12/21/2022) 12 tablet 0   No current facility-administered medications for this visit.    REVIEW OF SYSTEMS:  '[X]'$  denotes positive finding, '[ ]'$  denotes negative finding Cardiac  Comments:  Chest pain or chest pressure:    Shortness of breath upon exertion:    Short of breath when lying flat:    Irregular heart rhythm:        Vascular    Pain in calf, thigh, or hip brought on by ambulation:    Pain in feet at night that wakes you up from your sleep:     Blood clot in your veins:    Leg swelling:           PHYSICAL EXAM: Vitals:   12/21/22 0941  BP: (!) 157/82  Pulse: (!) 51  Temp: (!) 97.5 F (36.4 C)  Weight: 166 lb 3.2 oz (75.4 kg)  Height: '5\' 8"'$  (1.727 m)    GENERAL: The patient is a well-nourished male, in no acute distress. The vital signs are documented above. CARDIOVASCULAR: Palpable popliteal and posterior tibial pulses with no evidence of peripheral aneurysm PULMONARY: There is good air exchange  MUSCULOSKELETAL: There are no  major deformities or cyanosis. NEUROLOGIC: No focal weakness or paresthesias are detected. SKIN: There are no ulcers or rashes noted. PSYCHIATRIC: The patient has a normal affect.  DATA:  He did undergo CT scan in September 2023 for  evaluation of hernia.  I reviewed his images with him.  This shows excellent positioning of his stent graft with continued diminished size of his native aneurysm sac.  This is now in contact with his stent graft from origin into his iliacs  MEDICAL ISSUES: I discussed this with the patient and his wife.  He is essentially completely resolved to his aneurysm.  I do not feel he is a indication for continued follow-up now 9 years out from stent graft repair.  See [place] again on an as-needed basis    Rosetta Posner, MD [facility] and Ve[facility] Specialists of [facility] Tel [phone]  Note: Portions of this report may have been transcribed using voice recognition software.  Every effort has been made to ensure accuracy; however, inadvertent computerized transcription errors may still be present.

## 2022-12-21 NOTE — Telephone Encounter (Signed)
Pt c/o medication issue:  1. Name of Medication:   Evolocumab (REPATHA SURECLICK) 287 MG/ML SOAJ    2. How are you currently taking this medication (dosage and times per day)?   3. Are you having a reaction (difficulty breathing--STAT)?   4. What is your medication issue? Kimber, pharmacist at [facility] today called in stating she received a message from our office stating pt doesn't needs pa for this medication, but she said when she ran it through it does need pa. Please advise.

## 2023-01-02 DIAGNOSIS — J069 Acute upper respiratory infection, unspecified: Secondary | ICD-10-CM | POA: Diagnosis not present

## 2023-01-02 DIAGNOSIS — U071 COVID-19: Secondary | ICD-10-CM | POA: Diagnosis not present

## 2023-01-31 DIAGNOSIS — R1031 Right lower quadrant pain: Secondary | ICD-10-CM | POA: Diagnosis not present

## 2023-02-01 ENCOUNTER — Other Ambulatory Visit: Payer: Self-pay | Admitting: Surgery

## 2023-02-01 ENCOUNTER — Other Ambulatory Visit: Payer: Self-pay | Admitting: Internal Medicine

## 2023-02-01 DIAGNOSIS — R1031 Right lower quadrant pain: Secondary | ICD-10-CM

## 2023-02-03 ENCOUNTER — Ambulatory Visit
Admission: RE | Admit: 2023-02-03 | Discharge: 2023-02-03 | Disposition: A | Discharge status: Home / Self Care | Payer: Medicare HMO | Source: Ambulatory Visit | Attending: Surgery | Admitting: Surgery

## 2023-02-03 DIAGNOSIS — R1031 Right lower quadrant pain: Secondary | ICD-10-CM

## 2023-02-03 DIAGNOSIS — N281 Cyst of kidney, acquired: Secondary | ICD-10-CM | POA: Diagnosis not present

## 2023-02-03 DIAGNOSIS — R109 Unspecified abdominal pain: Secondary | ICD-10-CM | POA: Diagnosis not present

## 2023-02-07 ENCOUNTER — Encounter: Payer: Self-pay | Admitting: Surgery

## 2023-02-07 ENCOUNTER — Ambulatory Visit: Payer: Medicare HMO

## 2023-02-07 DIAGNOSIS — I255 Ischemic cardiomyopathy: Secondary | ICD-10-CM

## 2023-02-08 LAB — CUP PACEART REMOTE DEVICE CHECK
Battery Remaining Longevity: 38 mo
Battery Remaining Percentage: 40 %
Battery Voltage: 2.89 V
Brady Statistic AP VP Percent: 21 %
Brady Statistic AP VS Percent: 26 %
Brady Statistic AS VP Percent: 13 %
Brady Statistic AS VS Percent: 39 %
Brady Statistic RA Percent Paced: 46 %
Brady Statistic RV Percent Paced: 35 %
Date Time Interrogation Session: 20240227020018
HighPow Impedance: 46 Ohm
HighPow Impedance: 46 Ohm
Implantable Lead Connection Status: 753985
Implantable Lead Connection Status: 753985
Implantable Lead Implant Date: 20090427
Implantable Lead Implant Date: 20180305
Implantable Lead Location: 753859
Implantable Lead Location: 753860
Implantable Lead Model: 7121
Implantable Pulse Generator Implant Date: 20180305
Lead Channel Impedance Value: 310 Ohm
Lead Channel Impedance Value: 430 Ohm
Lead Channel Pacing Threshold Amplitude: 0.375 V
Lead Channel Pacing Threshold Amplitude: 1.5 V
Lead Channel Pacing Threshold Pulse Width: 0.5 ms
Lead Channel Pacing Threshold Pulse Width: 0.5 ms
Lead Channel Sensing Intrinsic Amplitude: 10.6 mV
Lead Channel Sensing Intrinsic Amplitude: 3.2 mV
Lead Channel Setting Pacing Amplitude: 1.375
Lead Channel Setting Pacing Amplitude: 1.75 V
Lead Channel Setting Pacing Pulse Width: 0.5 ms
Lead Channel Setting Sensing Sensitivity: 0.5 mV
Pulse Gen Serial Number: 1247854

## 2023-02-21 ENCOUNTER — Telehealth: Payer: Self-pay

## 2023-02-21 NOTE — Patient Outreach (Signed)
  Care Coordination   02/21/2023 Name: Joel Lin MRN: [id number] DOB: 28-Feb-1941   Care Coordination Outreach Attempts:  An unsuccessful telephone outreach was attempted today to offer the patient information about available care coordination services as a benefit of their health plan.   Follow Up Plan:  Additional outreach attempts will be made to offer the patient care coordination information and services.   Encounter Outcome:  No Answer   Care Coordination Interventions:  No, not indicated    SIG  Peter Garter RN, BSN,CCM, CDE Care Management Coordinator [facility] [phone]

## 2023-02-22 DIAGNOSIS — L578 Other skin changes due to chronic exposure to nonionizing radiation: Secondary | ICD-10-CM | POA: Diagnosis not present

## 2023-02-22 DIAGNOSIS — D225 Melanocytic nevi of trunk: Secondary | ICD-10-CM | POA: Diagnosis not present

## 2023-02-22 DIAGNOSIS — L821 Other seborrheic keratosis: Secondary | ICD-10-CM | POA: Diagnosis not present

## 2023-02-22 DIAGNOSIS — L814 Other melanin hyperpigmentation: Secondary | ICD-10-CM | POA: Diagnosis not present

## 2023-02-22 DIAGNOSIS — L570 Actinic keratosis: Secondary | ICD-10-CM | POA: Diagnosis not present

## 2023-03-01 ENCOUNTER — Telehealth: Payer: Self-pay

## 2023-03-01 NOTE — Patient Instructions (Signed)
Visit Information  Thank you for taking time to visit with me today. Please don't hesitate to contact me if I can be of assistance to you.   Following are the goals we discussed today:   Goals Addressed             This Visit's Progress    COMPLETED: Care Coordination Activities- No Follow Up Required       Interventions Today    Flowsheet Row Most Recent Value  General Interventions   General Interventions Discussed/Reviewed General Interventions Discussed, Doctor Visits  [Declines any needs for further RNCM follow up]  Doctor Visits Discussed/Reviewed Doctor Visits Discussed, Annual Wellness Visits, PCP  PCP/Specialist Visits Compliance with follow-up visit  Education Interventions   Education Provided Provided Education  Provided Verbal Education On Other  [care coordination services]               If you are experiencing a Mental Health or [place] or need someone to talk to, please call the Suicide and Crisis Lifeline: 988 call the [place] National Suicide Prevention Lifeline: (231) 403-1938 or TTY: (567) 721-3606 [place] (601)655-8136) to talk to a trained counselor call 1-800-273-TALK (toll free, 24 hour hotline) go to [facility] [place] [phone]) call 911   Patient verbalizes understanding of instructions and care plan provided today and agrees to view in [place]. Active MyChart status and patient understanding of how to access instructions and care plan via MyChart confirmed with patient.     No further follow up required:    [place], Jackquline [place], [facility] [phone]

## 2023-03-01 NOTE — Patient Outreach (Signed)
  Care Coordination   Initial Visit Note   03/01/2023 Name: Joel Lin MRN: [id number] DOB: [birth year]  Joel Lin is a 82 y.o. year old male who sees [name], MD for primary care. I spoke with  Arthor Captain by phone today.  What matters to the patients health and wellness today?  No concerns today    Goals Addressed             This Visit's Progress    COMPLETED: Care Coordination Activities- No Follow Up Required       Interventions Today    Flowsheet Row Most Recent Value  General Interventions   General Interventions Discussed/Reviewed General Interventions Discussed, Doctor Visits  [Declines any needs for further RNCM follow up]  Doctor Visits Discussed/Reviewed Doctor Visits Discussed, Annual Wellness Visits, PCP  PCP/Specialist Visits Compliance with follow-up visit  Education Interventions   Education Provided Provided Education  Provided Verbal Education On Other  [care coordination services]              SDOH assessments and interventions completed:  Yes  SDOH Interventions Today    Flowsheet Row Most Recent Value  SDOH Interventions   Food Insecurity Interventions Intervention Not Indicated  Housing Interventions Intervention Not Indicated  Transportation Interventions Intervention Not Indicated  Utilities Interventions Intervention Not Indicated        Care Coordination Interventions:  Yes, provided   Follow up plan: No further intervention required.   Encounter Outcome:  Pt. Visit Completed  Peter Garter RN, BSN,CCM, CDE Care Management Coordinator [facility] [phone]

## 2023-03-15 NOTE — Progress Notes (Signed)
Remote ICD transmission.   

## 2023-03-30 DIAGNOSIS — N401 Enlarged prostate with lower urinary tract symptoms: Secondary | ICD-10-CM | POA: Diagnosis not present

## 2023-03-30 DIAGNOSIS — R351 Nocturia: Secondary | ICD-10-CM | POA: Diagnosis not present

## 2023-03-30 DIAGNOSIS — R972 Elevated prostate specific antigen [PSA]: Secondary | ICD-10-CM | POA: Diagnosis not present

## 2023-04-26 ENCOUNTER — Other Ambulatory Visit: Payer: Self-pay | Admitting: Interventional Cardiology

## 2023-05-02 ENCOUNTER — Encounter (HOSPITAL_BASED_OUTPATIENT_CLINIC_OR_DEPARTMENT_OTHER): Payer: Self-pay

## 2023-05-02 ENCOUNTER — Other Ambulatory Visit: Payer: Self-pay

## 2023-05-02 ENCOUNTER — Emergency Department (HOSPITAL_BASED_OUTPATIENT_CLINIC_OR_DEPARTMENT_OTHER): Payer: Medicare HMO

## 2023-05-02 ENCOUNTER — Emergency Department (HOSPITAL_BASED_OUTPATIENT_CLINIC_OR_DEPARTMENT_OTHER)
Admission: EM | Admit: 2023-05-02 | Discharge: 2023-05-03 | Disposition: A | Discharge status: Home / Self Care | Payer: Medicare HMO | Attending: Emergency Medicine | Admitting: Emergency Medicine

## 2023-05-02 DIAGNOSIS — Z950 Presence of cardiac pacemaker: Secondary | ICD-10-CM | POA: Diagnosis not present

## 2023-05-02 DIAGNOSIS — R509 Fever, unspecified: Secondary | ICD-10-CM | POA: Insufficient documentation

## 2023-05-02 DIAGNOSIS — J029 Acute pharyngitis, unspecified: Secondary | ICD-10-CM | POA: Diagnosis not present

## 2023-05-02 DIAGNOSIS — R059 Cough, unspecified: Secondary | ICD-10-CM | POA: Diagnosis not present

## 2023-05-02 DIAGNOSIS — I10 Essential (primary) hypertension: Secondary | ICD-10-CM | POA: Diagnosis not present

## 2023-05-02 DIAGNOSIS — Z1152 Encounter for screening for COVID-19: Secondary | ICD-10-CM | POA: Insufficient documentation

## 2023-05-02 DIAGNOSIS — Z03818 Encounter for observation for suspected exposure to other biological agents ruled out: Secondary | ICD-10-CM | POA: Diagnosis not present

## 2023-05-02 LAB — COMPREHENSIVE METABOLIC PANEL
ALT: 24 U/L (ref 0–44)
AST: 28 U/L (ref 15–41)
Albumin: 4.2 g/dL (ref 3.5–5.0)
Alkaline Phosphatase: 53 U/L (ref 38–126)
Anion gap: 9 (ref 5–15)
BUN: 25 mg/dL — ABNORMAL HIGH (ref 8–23)
CO2: 24 mmol/L (ref 22–32)
Calcium: 9.1 mg/dL (ref 8.9–10.3)
Chloride: 104 mmol/L (ref 98–111)
Creatinine, Ser: 0.94 mg/dL (ref 0.61–1.24)
GFR, Estimated: >60 mL/min (ref >60)
Glucose, Bld: 136 mg/dL — ABNORMAL HIGH (ref 70–99)
Potassium: 4.1 mmol/L (ref 3.5–5.1)
Sodium: 137 mmol/L (ref 135–145)
Total Bilirubin: 0.4 mg/dL (ref 0.3–1.2)
Total Protein: 6.8 g/dL (ref 6.5–8.1)

## 2023-05-02 LAB — URINALYSIS, ROUTINE W REFLEX MICROSCOPIC
Bacteria, UA: NONE SEEN
Bilirubin Urine: NEGATIVE
Glucose, UA: NEGATIVE mg/dL
Ketones, ur: NEGATIVE mg/dL
Leukocytes,Ua: NEGATIVE
Nitrite: NEGATIVE
Protein, ur: NEGATIVE mg/dL
Specific Gravity, Urine: 1.015 (ref 1.005–1.030)
pH: 6.5 (ref 5.0–8.0)

## 2023-05-02 LAB — CBC WITH DIFFERENTIAL/PLATELET
Abs Immature Granulocytes: 0.01 10*3/uL (ref 0.00–0.07)
Basophils Absolute: 0 10*3/uL (ref 0.0–0.1)
Basophils Relative: 1 %
Eosinophils Absolute: 0.1 10*3/uL (ref 0.0–0.5)
Eosinophils Relative: 2 %
HCT: 42.3 % (ref 39.0–52.0)
Hemoglobin: 13.8 g/dL (ref 13.0–17.0)
Immature Granulocytes: 0 %
Lymphocytes Relative: 23 %
Lymphs Abs: 0.8 10*3/uL (ref 0.7–4.0)
MCH: 28.2 pg (ref 26.0–34.0)
MCHC: 32.6 g/dL (ref 30.0–36.0)
MCV: 86.3 fL (ref 80.0–100.0)
Monocytes Absolute: 1.4 10*3/uL — ABNORMAL HIGH (ref 0.1–1.0)
Monocytes Relative: 40 %
Neutro Abs: 1.2 10*3/uL — ABNORMAL LOW (ref 1.7–7.7)
Neutrophils Relative %: 34 %
Platelets: 124 10*3/uL — ABNORMAL LOW (ref 150–400)
RBC: 4.9 MIL/uL (ref 4.22–5.81)
RDW: 13.2 % (ref 11.5–15.5)
WBC: 3.5 10*3/uL — ABNORMAL LOW (ref 4.0–10.5)
nRBC: 0 % (ref 0.0–0.2)

## 2023-05-02 LAB — LACTIC ACID, PLASMA: Lactic Acid, Venous: 1.1 mmol/L (ref 0.5–1.9)

## 2023-05-02 LAB — LIPASE, BLOOD: Lipase: 85 U/L — ABNORMAL HIGH (ref 11–51)

## 2023-05-02 MED ORDER — SODIUM CHLORIDE 0.9 % IV BOLUS
500.0000 mL | Freq: Once | INTRAVENOUS | Status: AC
  Administered 2023-05-02: 500 mL via INTRAVENOUS

## 2023-05-02 MED ORDER — SODIUM CHLORIDE 0.9 % IV BOLUS: 1000.0000 mL | Freq: Once | INTRAVENOUS | Status: DC

## 2023-05-02 NOTE — ED Provider Notes (Signed)
[place] EMERGENCY DEPARTMENT AT [facility] Provider Note   CSN: [id number] Arrival date & time: 05/02/23  2221     History  Chief Complaint  Patient presents with   Fever    Joel Lin is a 82 y.o. male.  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  Patient also has history of hyperlipidemia, frequent PVCs and negative COVID, hypertension.  Patient presenting today with complaints of fever, cough, and sore throat.  This has been present for the past 3 days.  This evening he began to feel chilled, then checked his temperature and it was 100.8.  He then rechecked it and it was 102.7, then decided to come to the ER to be evaluated.  He was seen by his primary doctor yesterday and tells me he had a negative COVID test, negative strep test, and that his lungs were clear.  He was discharged with over-the-counter medications and as needed return.  Patient presenting this evening because he became febrile as above.  He denies chest pain, difficulty breathing.  His cough is intermittently productive of clear sputum.  The history is provided by the patient.       Home Medications Prior to Admission medications   Medication Sig Start Date End Date Taking? Authorizing Provider  aspirin 81 MG tablet Take 81 mg by mouth every evening.    [provider]  cholecalciferol (VITAMIN D) 1000 units tablet Take 1,000 Units by mouth daily.    [provider]  Evolocumab (REPATHA SURECLICK) 140 MG/ML SOAJ Inject 140 mg into the skin every 14 (fourteen) days. 12/19/22   Corky Crafts, MD  fexofenadine (ALLEGRA) 180 MG tablet Take 180 mg by mouth daily.    [provider]  fish oil-omega-3 fatty acids 1000 MG capsule Take 1 g by mouth daily.     [provider]  fluticasone (FLONASE) 50 MCG/ACT nasal spray Place 1 spray into both nostrils daily as needed for allergies or rhinitis.     [provider]  Glucosamine Sulfate-MSM (GLUCOSAMINE-MSM DS PO) Take 1 tablet by mouth daily.    [provider]  ibuprofen (ADVIL,MOTRIN) 200 MG tablet Take 400 mg by mouth every 6 (six) hours as needed for moderate pain.    [provider]  lisinopril (ZESTRIL) 2.5 MG tablet TAKE 1 TABLET DAILY. 02/01/23   Duke Salvia, MD  Melatonin 5 MG CAPS Take 5 mg by mouth at bedtime as needed (sleep).    [provider]  Multiple Vitamin (MULTIVITAMIN WITH MINERALS) TABS Take 1 tablet by mouth daily.    [provider]  nitroGLYCERIN (NITRODUR - DOSED IN MG/24 HR) 0.2 mg/hr patch APPLY 1 PATCH ONTO THE [facility] 10/14/22   Duke Salvia, MD  nitroGLYCERIN (NITROSTAT) 0.4 MG SL tablet Place 1 tablet (0.4 mg total) under the tongue every 5 (five) minutes as needed for chest pain ((MAX 3 DOSES)). 11/22/22   Corky Crafts, MD  oxyCODONE (OXY IR/ROXICODONE) 5 MG immediate release tablet Take 1 tablet (5 mg total) by mouth every 6 (six) hours as needed for severe pain. 08/24/22   Cornett, Maisie Fus, MD  polyethylene glycol (MIRALAX / GLYCOLAX) 17 g packet Take 17 g by mouth daily as needed for moderate constipation.    [provider]  rosuvastatin (CRESTOR) 5 MG tablet TAKE 1 TABLET EVERY OTHER  DAY 07/11/22   Duke Salvia, MD  sotalol (BETAPACE) 80 MG tablet  TAKE 1 TABLET TWICE A DAY 04/26/23   Corky Crafts, MD  spironolactone (ALDACTONE) 25 MG tablet Take 0.5 tablets (12.5 mg total) by mouth daily. 12/06/22   Duke Salvia, MD  traMADol (ULTRAM) 50 MG tablet Take 1 tablet (50 mg total) by mouth every 8 (eight) hours as needed for up to 12 doses for severe pain. Patient not taking: Reported on 12/21/2022 08/17/22   Terald Sleeper, MD      Allergies    Epinephrine, Losartan, Metoprolol, Mydriacyl [tropicamide], Phenylephrine hcl (pressors), Bisoprolol, Iodinated contrast media, Clopidogrel bisulfate, Iodine, Other, and Statins    Review  of Systems   Review of Systems  All other systems reviewed and are negative.   Physical Exam Updated Vital Signs BP (!) 171/70 (BP Location: Right Arm)   Pulse 70   Temp 99.4 F (37.4 C) (Oral)   Resp 18   Ht 5\' 9"  (1.753 m)   Wt 73.9 kg   SpO2 97%   BMI 24.07 kg/m  Physical Exam Vitals and nursing note reviewed.  Constitutional:      General: He is not in acute distress.    Appearance: Normal appearance. He is well-developed. He is not diaphoretic.  HENT:     Head: Normocephalic and atraumatic.     Mouth/Throat:     Mouth: Mucous membranes are moist.     Pharynx: No oropharyngeal exudate or posterior oropharyngeal erythema.  Cardiovascular:     Rate and Rhythm: Normal rate and regular rhythm.     Heart sounds: No murmur heard.    No friction rub.  Pulmonary:     Effort: Pulmonary effort is normal. No respiratory distress.     Breath sounds: Normal breath sounds. No wheezing or rales.  Abdominal:     General: Bowel sounds are normal. There is no distension.     Palpations: Abdomen is soft.     Tenderness: There is no abdominal tenderness.  Musculoskeletal:        General: Normal range of motion.     Cervical back: Normal range of motion and neck supple. No rigidity or tenderness.  Lymphadenopathy:     Cervical: No cervical adenopathy.  Skin:    General: Skin is warm and dry.  Neurological:     Mental Status: He is alert and oriented to person, place, and time.     Coordination: Coordination normal.     ED Results / Procedures / Treatments   Labs (all labs ordered are listed, but only abnormal results are displayed) Labs Reviewed  URINE CULTURE  RESP PANEL BY RT-PCR (RSV, FLU A&B, COVID)  RVPGX2  CBC WITH DIFFERENTIAL/PLATELET  COMPREHENSIVE METABOLIC PANEL  LIPASE, BLOOD  LACTIC ACID, PLASMA  LACTIC ACID, PLASMA  URINALYSIS, ROUTINE W REFLEX MICROSCOPIC    EKG None  Radiology DG Chest Portable 1 View  Result Date: 05/02/2023 CLINICAL DATA:  Fever  and cough. EXAM: PORTABLE CHEST 1 VIEW COMPARISON:  Chest x-ray 05/13/2022 FINDINGS: Left-sided pacemaker is unchanged in position. The cardiomediastinal silhouette is within normal limits. There is no lung consolidation, pleural effusion or pneumothorax. There are healed right-sided rib fractures. IMPRESSION: No acute cardiopulmonary process. Electronically Signed   By: Darliss Cheney M.D.   On: 05/02/2023 23:01    Procedures Procedures  {Document cardiac monitor, telemetry assessment procedure when appropriate:1}  Medications Ordered in ED Medications  sodium chloride 0.9 % bolus 500 mL (has no administration in time range)    ED Course/ Medical Decision Making/ A&P   {  Click here for ABCD2, HEART and other calculatorsREFRESH Note before signing :1}                          Medical Decision Making  ***  {Document critical care time when appropriate:1} {Document review of labs and clinical decision tools ie heart score, Chads2Vasc2 etc:1}  {Document your independent review of radiology images, and any outside records:1} {Document your discussion with family members, caretakers, and with consultants:1} {Document social determinants of health affecting pt's care:1} {Document your decision making why or why not admission, treatments were needed:1} Final Clinical Impression(s) / ED Diagnoses Final diagnoses:  None    Rx / DC Orders ED Discharge Orders     None

## 2023-05-02 NOTE — ED Triage Notes (Signed)
Patient here POV from Home.  Endorses Cough and Sore Throat over a few days. Tested Negative for Strep and COVID-19 with PCP. Seeks Evaluation as his HR became low at home and he began to have a Fever of 102.9  NAD Noted during Triage. A&Ox4. GCS 15. Ambulatory.

## 2023-05-03 LAB — RESP PANEL BY RT-PCR (RSV, FLU A&B, COVID)  RVPGX2
Influenza A by PCR: NEGATIVE
Influenza B by PCR: NEGATIVE
Resp Syncytial Virus by PCR: NEGATIVE
SARS Coronavirus 2 by RT PCR: NEGATIVE

## 2023-05-03 NOTE — ED Notes (Signed)
Pt verbalized understanding of d/c instructions, meds, and followup care. Denies questions. VSS, no distress noted. Steady gait to exit with all belongings.  ?

## 2023-05-03 NOTE — Discharge Instructions (Signed)
Take Tylenol 1000 mg rotated with ibuprofen 600 mg every 4 hours as needed for fever.  We will call you if your blood cultures indicate you need to take further action or require additional treatment.  Return to the ER if you develop difficulty breathing, severe chest pains, or for other new and concerning symptoms.

## 2023-05-04 LAB — URINE CULTURE: Culture: NO GROWTH

## 2023-05-09 ENCOUNTER — Ambulatory Visit (INDEPENDENT_AMBULATORY_CARE_PROVIDER_SITE_OTHER): Payer: Medicare HMO

## 2023-05-09 DIAGNOSIS — I255 Ischemic cardiomyopathy: Secondary | ICD-10-CM | POA: Diagnosis not present

## 2023-05-10 LAB — CUP PACEART REMOTE DEVICE CHECK
Battery Remaining Longevity: 35 mo
Battery Remaining Percentage: 37 %
Battery Voltage: 2.87 V
Brady Statistic AP VP Percent: 21 %
Brady Statistic AP VS Percent: 24 %
Brady Statistic AS VP Percent: 16 %
Brady Statistic AS VS Percent: 38 %
Brady Statistic RA Percent Paced: 44 %
Brady Statistic RV Percent Paced: 38 %
Date Time Interrogation Session: 20240528033645
HighPow Impedance: 45 Ohm
HighPow Impedance: 46 Ohm
Implantable Lead Connection Status: 753985
Implantable Lead Connection Status: 753985
Implantable Lead Implant Date: 20090427
Implantable Lead Implant Date: 20180305
Implantable Lead Location: 753859
Implantable Lead Location: 753860
Implantable Lead Model: 7121
Implantable Pulse Generator Implant Date: 20180305
Lead Channel Impedance Value: 310 Ohm
Lead Channel Impedance Value: 430 Ohm
Lead Channel Pacing Threshold Amplitude: 0.5 V
Lead Channel Pacing Threshold Amplitude: 1.5 V
Lead Channel Pacing Threshold Pulse Width: 0.5 ms
Lead Channel Pacing Threshold Pulse Width: 0.5 ms
Lead Channel Sensing Intrinsic Amplitude: 11 mV
Lead Channel Sensing Intrinsic Amplitude: 3.7 mV
Lead Channel Setting Pacing Amplitude: 1.5 V
Lead Channel Setting Pacing Amplitude: 1.75 V
Lead Channel Setting Pacing Pulse Width: 0.5 ms
Lead Channel Setting Sensing Sensitivity: 0.5 mV
Pulse Gen Serial Number: 1247854

## 2023-05-12 ENCOUNTER — Other Ambulatory Visit: Payer: Self-pay

## 2023-05-12 MED ORDER — ROSUVASTATIN CALCIUM 5 MG PO TABS: 5.0000 mg | ORAL_TABLET | ORAL | 0 refills | Status: DC

## 2023-06-01 NOTE — Progress Notes (Signed)
Remote ICD transmission.   

## 2023-06-06 DIAGNOSIS — M25562 Pain in left knee: Secondary | ICD-10-CM | POA: Diagnosis not present

## 2023-06-06 DIAGNOSIS — M25561 Pain in right knee: Secondary | ICD-10-CM | POA: Diagnosis not present

## 2023-07-04 DIAGNOSIS — M25562 Pain in left knee: Secondary | ICD-10-CM | POA: Diagnosis not present

## 2023-07-04 DIAGNOSIS — M25561 Pain in right knee: Secondary | ICD-10-CM | POA: Diagnosis not present

## 2023-07-09 ENCOUNTER — Other Ambulatory Visit: Payer: Self-pay | Admitting: Internal Medicine

## 2023-07-11 ENCOUNTER — Encounter: Payer: Self-pay | Admitting: Internal Medicine

## 2023-07-11 ENCOUNTER — Ambulatory Visit: Payer: Medicare HMO | Attending: Internal Medicine | Admitting: Internal Medicine

## 2023-07-11 VITALS — BP 132/70 | Ht 69.0 in | Wt 167.4 lb

## 2023-07-11 DIAGNOSIS — I493 Ventricular premature depolarization: Secondary | ICD-10-CM

## 2023-07-11 DIAGNOSIS — Z79899 Other long term (current) drug therapy: Secondary | ICD-10-CM | POA: Diagnosis not present

## 2023-07-11 DIAGNOSIS — I255 Ischemic cardiomyopathy: Secondary | ICD-10-CM

## 2023-07-11 DIAGNOSIS — I495 Sick sinus syndrome: Secondary | ICD-10-CM | POA: Diagnosis not present

## 2023-07-11 DIAGNOSIS — Z95810 Presence of automatic (implantable) cardiac defibrillator: Secondary | ICD-10-CM

## 2023-07-11 DIAGNOSIS — I472 Ventricular tachycardia, unspecified: Secondary | ICD-10-CM

## 2023-07-11 NOTE — Progress Notes (Signed)
Patient Care Team: Renford Dills, MD as PCP - General (Internal Medicine) Corky Crafts, MD as PCP - Cardiology (Cardiology) Duke Salvia, MD as Attending Physician (Cardiology) Corky Crafts, MD as Consulting Physician (Cardiology)   HPI  Joel Lin is a 82 y.o. male is seen in followup for aborted sudden cardiac death in the setting of ischemic heart disease with  prior PCI and mild depression of LV systolic function. He is status post ICD implantation;  generator change 2/18 with DDD upgrade secondary to symptomatic sinus bradycardia.  Appropriate therapy 7/16 with ventricular tachycardia failed termination with ATP and for which he was shocked. A second episode of monomorphic ventricular tachycardia terminated on its own. He was started on sotalol   Phantom shock 6/23; viral illness 5/24 with a fever to 102.9.  Arthritis issues on meloxicam for months stopped about a week ago.  The patient denies chest pain, shortness of breath, nocturnal dyspnea, orthopnea or peripheral edema.  There have been no palpitations, lightheadedness or syncope.       DATE TEST EF   11/12    myoview    45 % Old inferolateral   1/14 echo   40 %   2/18 Echo  40-45%         Date Cr K Mg Hgb  6/17  0.95 4.6  *   2/18  1.01 4.4 2.4   9/19 0.87 4.2    3/20   2.3   9/20 0.92 4.5     5/22 0.92 4.4 2.4   6/23 0.99 4.0 2.3 14.4  5/24 0.94 4.1   13.8     Appropriate Therapy yes -- VT ATP  Inappropriate Therapy yes-- sinus tachycardia Antiarrhythmics Date  sotalol          Past Medical History:  Diagnosis Date   AAA (abdominal aortic aneurysm) (HCC)    AICD (automatic cardioverter/defibrillator) present    Arthritis    wrist   Cardiac arrest (HCC) 03/29/2008   a. resuscitated OOH VF arrest  b. s/p STJ ICD   Contrast media allergy    Coronary artery disease    myoview 2011>>EF of 53% / large area of old inferolateral infarct but no reversible ischemia            Hemorrhoid    Hypercoagulable state (HCC)    Questionable history of a borderline hypercoagulable state   Hyperlipidemia    Myocardial infarction (HCC) 12/13/1979   Peripheral vascular disease (HCC)    pt denies this   Pneumonia    years ago   PVCs (premature ventricular contractions)    occasionally   Ventricular tachycardia (HCC)    a. appropriate ICD therpay b. on Sotalol      1  Past Surgical History:  Procedure Laterality Date   ABDOMINAL AORTIC ENDOVASCULAR STENT GRAFT N/A 12/06/2013   Procedure: ABDOMINAL AORTIC ENDOVASCULAR STENT GRAFT;  Surgeon: Larina Earthly, MD;  Location: [facility] OR;  Service: Vascular;  Laterality: N/A;   CARDIAC CATHETERIZATION  04/04/2008    LAD stent patent, D1 OK, OM1 90/80/80% (2mm vessel), RCA 30%; EF 35%.   CARDIAC DEFIBRILLATOR PLACEMENT  04/07/2008    STJ single chamber ICD implanted for secondary prevention   CHOLECYSTECTOMY     CHOLECYSTECTOMY, LAPAROSCOPIC  08/13/2009   Calculous cholecystitis   CORONARY ANGIOPLASTY WITH STENT PLACEMENT  1999   PTCA and stenting of his left anterior descending artery   ICD REVISION N/A 02/13/2017  Procedure: Upgrade to Dual Chamber ICD;  Surgeon: Will Jorja Loa, MD;  Location: [facility] INVASIVE CV LAB;  Service: Cardiovascular;  Laterality: N/A;   INGUINAL HERNIA REPAIR Right 08/24/2022   Procedure: RIGHT INGUINAL HERNIA REPAIR;  Surgeon: Harriette Bouillon, MD;  Location: [facility] OR;  Service: General;  Laterality: Right;  GEN & TAP BLOCK   INSERTION OF MESH Right 08/24/2022   Procedure: INSERTION OF MESH;  Surgeon: Harriette Bouillon, MD;  Location: [facility] OR;  Service: General;  Laterality: Right;   LEFT HEART CATHETERIZATION WITH CORONARY ANGIOGRAM N/A 02/28/2013   Procedure: LEFT HEART CATHETERIZATION WITH CORONARY ANGIOGRAM;  Surgeon: Tonny Bollman, MD;  Location: [facility] CATH LAB;  Service: Cardiovascular;  Laterality: N/A;   TONSILLECTOMY      Current Outpatient Medications  Medication Sig Dispense Refill   aspirin  81 MG tablet Take 81 mg by mouth every evening.     cholecalciferol (VITAMIN D) 1000 units tablet Take 1,000 Units by mouth daily.     Evolocumab (REPATHA SURECLICK) 140 MG/ML SOAJ Inject 140 mg into the skin every 14 (fourteen) days. 6 mL 3   fexofenadine (ALLEGRA) 180 MG tablet Take 180 mg by mouth daily.     fish oil-omega-3 fatty acids 1000 MG capsule Take 1 g by mouth daily.      fluticasone (FLONASE) 50 MCG/ACT nasal spray Place 1 spray into both nostrils daily as needed for allergies or rhinitis.     Glucosamine Sulfate-MSM (GLUCOSAMINE-MSM DS PO) Take 1 tablet by mouth daily.     ibuprofen (ADVIL,MOTRIN) 200 MG tablet Take 400 mg by mouth every 6 (six) hours as needed for moderate pain.     lisinopril (ZESTRIL) 2.5 MG tablet TAKE 1 TABLET DAILY. 90 tablet 1   Melatonin 5 MG CAPS Take 5 mg by mouth at bedtime as needed (sleep).     meloxicam (MOBIC) 15 MG tablet 1 tablet by mouth once a day as needed for knee pain     Multiple Vitamin (MULTIVITAMIN WITH MINERALS) TABS Take 1 tablet by mouth daily.     nitroGLYCERIN (NITRODUR - DOSED IN MG/24 HR) 0.2 mg/hr patch APPLY 1 PATCH ONTO THE SKINDAILY 90 patch 2   nitroGLYCERIN (NITROSTAT) 0.4 MG SL tablet Place 1 tablet (0.4 mg total) under the tongue every 5 (five) minutes as needed for chest pain ((MAX 3 DOSES)). 25 tablet 6   oxyCODONE (OXY IR/ROXICODONE) 5 MG immediate release tablet Take 1 tablet (5 mg total) by mouth every 6 (six) hours as needed for severe pain. 15 tablet 0   polyethylene glycol (MIRALAX / GLYCOLAX) 17 g packet Take 17 g by mouth daily as needed for moderate constipation.     rosuvastatin (CRESTOR) 5 MG tablet Take 1 tablet (5 mg total) by mouth every other day. 45 tablet 0   sotalol (BETAPACE) 80 MG tablet TAKE 1 TABLET TWICE A DAY 180 tablet 1   spironolactone (ALDACTONE) 25 MG tablet Take 0.5 tablets (12.5 mg total) by mouth daily. 45 tablet 2   traMADol (ULTRAM) 50 MG tablet Take 1 tablet (50 mg total) by mouth every 8  (eight) hours as needed for up to 12 doses for severe pain. 12 tablet 0   No current facility-administered medications for this visit.    Allergies  Allergen Reactions   Epinephrine Other (See Comments)    Elevated heartbeat   Losartan Palpitations and Other (See Comments)    Irregular heartbeat   Metoprolol Other (See Comments)    Irregular heartbeat  Mydriacyl [Tropicamide] Other (See Comments)    Elevated heartbeat   Phenylephrine Hcl (Pressors) Other (See Comments)    Increased heartrate   Bisoprolol Other (See Comments)    Feels it does not agree with him   Iodinated Contrast Media Hives   Clopidogrel Bisulfate Hives        Iodine Hives    "Renografin"   Other Other (See Comments)    Muscle cramps (crestor low dose is ok)   Statins Other (See Comments)    Muscle cramps (crestor low dose is ok)    Review of Systems negative except from HPI and PMH  Physical Exam BP 132/70   Ht 5\' 9"  (1.753 m)   Wt 167 lb 6.4 oz (75.9 kg)   SpO2 96%   BMI 24.72 kg/m  Well developed and well nourished in no acute distress HENT normal Neck supple with JVP-flat Clear Device pocket well healed; without hematoma or erythema.  There is no tethering  Regular rate and rhythm, no  gallop No  murmur Abd-soft with active BS No Clubbing cyanosis  edema Skin-warm and dry A & Oriented  Grossly normal sensory and motor function  ECG atrial pacing at 60 with frequent PVCs and AR interval of about 300 ms  Device function is  normal. Programming changes none   See Paceart for details     Assessment and  Plan  Aborted cardiac arrest   Ischemic Cardiomyopathy  Ventricular tachycardia  High Risk Medication Surveillance Sotalol  Sinus node dysfunction first-degree AV block  Implantable defibrillator   Hyperlipidemia  Phantom Shock   PVCs   Patient has a new onset PVC burden of that is almost trigeminal.  Looking back to the report courting 5/24 there seem to be few.  He may  be under sensed, but temporally related to 2 things including exposure to meloxicam as well as an interval febrile illness to 103 degrees.  Attributed to a virus so there may have been a viral myocarditis component to this.  Efforts to overdrive pace resulted in encroaching on AV nodal refractoriness and 2: 1 conduction  We will plan to check his echocardiogram to look for changes in LV function, check his electrolytes including magnesium and then reassess symptoms in about 3 months.  Has issues with his PCSK9 inhibitor, will need help from the pharmacists in the clinic.  No symptoms of ischemia.  Continue him on aspirin and Crestor.  No ventricular tachycardia for right now we will continue him on his sotalol.

## 2023-07-11 NOTE — Patient Instructions (Addendum)
Medication Instructions:  Your physician recommends that you continue on your current medications as directed. Please refer to the Current Medication list given to you today.  *If you need a refill on your cardiac medications before your next appointment, please call your pharmacy*   Lab Work: BMET and Mg If you have labs (blood work) drawn today and your tests are completely normal, you will receive your results only by: MyChart Message (if you have MyChart) OR A paper copy in the mail If you have any lab test that is abnormal or we need to change your treatment, we will call you to review the results.   Testing/Procedures: Your physician has requested that you have an echocardiogram. Echocardiography is a painless test that uses sound waves to create images of your heart. It provides your doctor with information about the size and shape of your heart and how well your heart's chambers and valves are working. This procedure takes approximately one hour. There are no restrictions for this procedure. Please do NOT wear cologne, perfume, aftershave, or lotions (deodorant is allowed). Please arrive 15 minutes prior to your appointment time.     Follow-Up: At [facility], you and your health needs are our priority.  As part of our continuing mission to provide you with exceptional heart care, we have created designated Provider Care Teams.  These Care Teams include your primary Cardiologist (physician) and Advanced Practice Providers (APPs -  Physician Assistants and Nurse Practitioners) who all work together to provide you with the care you need, when you need it.  We recommend signing up for the patient portal called "MyChart".  Sign up information is provided on this After Visit Summary.  MyChart is used to connect with patients for Virtual Visits (Telemedicine).  Patients are able to view lab/test results, encounter notes, upcoming appointments, etc.  Non-urgent messages can be  sent to your provider as well.   To learn more about what you can do with MyChart, go to [email or web address].    Your next appointment:   3 months with Dr Graciela Husbands

## 2023-07-12 ENCOUNTER — Telehealth: Payer: Self-pay | Admitting: Pharmacist

## 2023-07-12 NOTE — Telephone Encounter (Signed)
Spoke with pt, added his name to our list of pts to enroll in [facility] when it re-opens. He asks about extending injections to once a month. Not FDA approved for that dosing regimen but also better than the alternative of not taking at all due to cost, LDL would just fluctuate a bit and increase during the second 2 weeks. Discussed donut hole should go away next year. Pt appreciative for the assistance.

## 2023-07-12 NOTE — Telephone Encounter (Signed)
Currently no assistance for Repatha as [employer] grant is closed. Can let him know once it opens again though.  Called pt, no answer, left message.

## 2023-07-12 NOTE — Telephone Encounter (Signed)
-----   Message from Nurse Mindi Junker R sent at 07/11/2023  6:51 PM EDT ----- Regarding: Repatha Pt is about to fall into the "donut hole" and is asking for assistance with Repatha.  Thank You,  Joel Lin

## 2023-07-18 DIAGNOSIS — D696 Thrombocytopenia, unspecified: Secondary | ICD-10-CM | POA: Diagnosis not present

## 2023-07-18 DIAGNOSIS — I2510 Atherosclerotic heart disease of native coronary artery without angina pectoris: Secondary | ICD-10-CM | POA: Diagnosis not present

## 2023-07-18 DIAGNOSIS — I255 Ischemic cardiomyopathy: Secondary | ICD-10-CM | POA: Diagnosis not present

## 2023-07-18 DIAGNOSIS — I5022 Chronic systolic (congestive) heart failure: Secondary | ICD-10-CM | POA: Diagnosis not present

## 2023-07-18 DIAGNOSIS — E7800 Pure hypercholesterolemia, unspecified: Secondary | ICD-10-CM | POA: Diagnosis not present

## 2023-07-18 DIAGNOSIS — I714 Abdominal aortic aneurysm, without rupture, unspecified: Secondary | ICD-10-CM | POA: Diagnosis not present

## 2023-07-18 DIAGNOSIS — I10 Essential (primary) hypertension: Secondary | ICD-10-CM | POA: Diagnosis not present

## 2023-07-18 DIAGNOSIS — Z95810 Presence of automatic (implantable) cardiac defibrillator: Secondary | ICD-10-CM | POA: Diagnosis not present

## 2023-07-23 DIAGNOSIS — R972 Elevated prostate specific antigen [PSA]: Secondary | ICD-10-CM | POA: Diagnosis not present

## 2023-08-01 ENCOUNTER — Other Ambulatory Visit: Payer: Self-pay

## 2023-08-01 MED ORDER — ROSUVASTATIN CALCIUM 5 MG PO TABS: 5.0000 mg | ORAL_TABLET | ORAL | 3 refills | Status: DC

## 2023-08-02 ENCOUNTER — Ambulatory Visit (HOSPITAL_COMMUNITY): Payer: Medicare HMO | Attending: Internal Medicine

## 2023-08-02 DIAGNOSIS — I472 Ventricular tachycardia, unspecified: Secondary | ICD-10-CM

## 2023-08-02 DIAGNOSIS — I493 Ventricular premature depolarization: Secondary | ICD-10-CM | POA: Diagnosis not present

## 2023-08-02 DIAGNOSIS — I255 Ischemic cardiomyopathy: Secondary | ICD-10-CM | POA: Insufficient documentation

## 2023-08-02 LAB — ECHOCARDIOGRAM COMPLETE
Area-P 1/2: 5.88 cm2
MV M vel: 4.58 m/s
MV Peak grad: 83.9 mmHg
Radius: 0.65 cm
S' Lateral: 4.3 cm

## 2023-08-06 ENCOUNTER — Other Ambulatory Visit: Payer: Self-pay | Admitting: Interventional Cardiology

## 2023-08-06 ENCOUNTER — Other Ambulatory Visit: Payer: Self-pay | Admitting: Internal Medicine

## 2023-08-08 ENCOUNTER — Ambulatory Visit (INDEPENDENT_AMBULATORY_CARE_PROVIDER_SITE_OTHER): Payer: Medicare HMO

## 2023-08-08 DIAGNOSIS — I495 Sick sinus syndrome: Secondary | ICD-10-CM | POA: Diagnosis not present

## 2023-08-08 DIAGNOSIS — I472 Ventricular tachycardia, unspecified: Secondary | ICD-10-CM

## 2023-08-08 LAB — CUP PACEART REMOTE DEVICE CHECK
Battery Remaining Longevity: 32 mo
Battery Remaining Percentage: 35 %
Battery Voltage: 2.86 V
Brady Statistic AP VP Percent: 33 %
Brady Statistic AP VS Percent: 21 %
Brady Statistic AS VP Percent: 16 %
Brady Statistic AS VS Percent: 22 %
Brady Statistic RA Percent Paced: 45 %
Brady Statistic RV Percent Paced: 49 %
Date Time Interrogation Session: 20240827020015
HighPow Impedance: 47 Ohm
HighPow Impedance: 47 Ohm
Implantable Lead Connection Status: 753985
Implantable Lead Connection Status: 753985
Implantable Lead Implant Date: 20090427
Implantable Lead Implant Date: 20180305
Implantable Lead Location: 753859
Implantable Lead Location: 753860
Implantable Lead Model: 7121
Implantable Pulse Generator Implant Date: 20180305
Lead Channel Impedance Value: 310 Ohm
Lead Channel Impedance Value: 400 Ohm
Lead Channel Pacing Threshold Amplitude: 0.625 V
Lead Channel Pacing Threshold Amplitude: 1.625 V
Lead Channel Pacing Threshold Pulse Width: 0.5 ms
Lead Channel Pacing Threshold Pulse Width: 0.5 ms
Lead Channel Sensing Intrinsic Amplitude: 11.3 mV
Lead Channel Sensing Intrinsic Amplitude: 3.5 mV
Lead Channel Setting Pacing Amplitude: 1.625
Lead Channel Setting Pacing Amplitude: 1.875
Lead Channel Setting Pacing Pulse Width: 0.5 ms
Lead Channel Setting Sensing Sensitivity: 0.5 mV
Pulse Gen Serial Number: 1247854

## 2023-08-16 ENCOUNTER — Encounter: Payer: Self-pay | Admitting: Pharmacist

## 2023-08-21 NOTE — Progress Notes (Signed)
Remote ICD transmission.   

## 2023-09-12 DIAGNOSIS — H2513 Age-related nuclear cataract, bilateral: Secondary | ICD-10-CM | POA: Diagnosis not present

## 2023-09-12 DIAGNOSIS — H5213 Myopia, bilateral: Secondary | ICD-10-CM | POA: Diagnosis not present

## 2023-09-12 DIAGNOSIS — D23112 Other benign neoplasm of skin of right lower eyelid, including canthus: Secondary | ICD-10-CM | POA: Diagnosis not present

## 2023-09-15 IMAGING — DX DG CHEST 1V PORT
1 series · 1 of 1 positions shown · non-contrast
Comparison: Chest x-ray 02/14/2017

CLINICAL DATA: Recently shocked by defibrillator.

EXAM:
PORTABLE CHEST 1 VIEW

[chest ap]
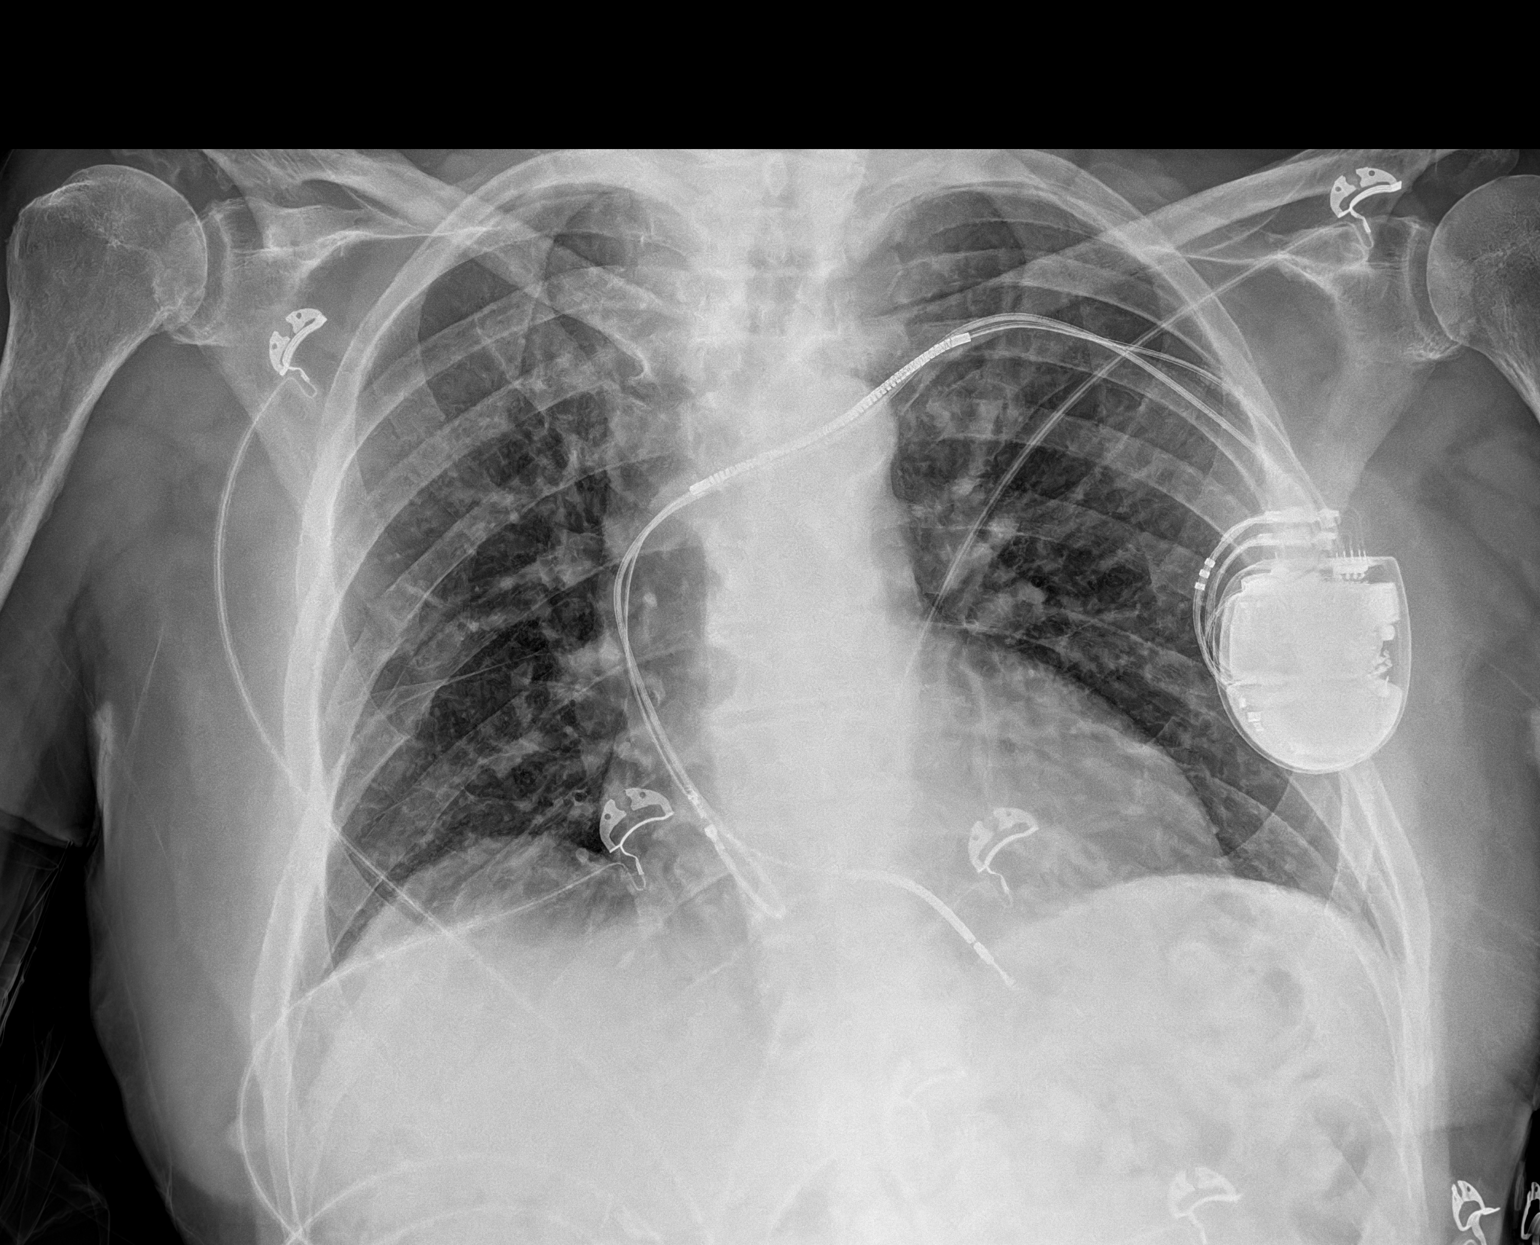

[1 of 1 positions shown; findings below may reference images not displayed]

FINDINGS: Left-sided pacemaker again seen. The heart size and mediastinal
contours are within normal limits. Both lungs are clear. There are
healed right-sided rib fractures, unchanged.
IMPRESSION: No active disease.

## 2023-10-17 ENCOUNTER — Encounter: Payer: Self-pay | Admitting: Internal Medicine

## 2023-10-17 ENCOUNTER — Ambulatory Visit: Payer: Medicare HMO | Attending: Internal Medicine | Admitting: Internal Medicine

## 2023-10-17 VITALS — BP 118/62 | HR 59 | Ht 68.5 in | Wt 164.2 lb

## 2023-10-17 DIAGNOSIS — I255 Ischemic cardiomyopathy: Secondary | ICD-10-CM

## 2023-10-17 DIAGNOSIS — I472 Ventricular tachycardia, unspecified: Secondary | ICD-10-CM | POA: Diagnosis not present

## 2023-10-17 DIAGNOSIS — Z4502 Encounter for adjustment and management of automatic implantable cardiac defibrillator: Secondary | ICD-10-CM

## 2023-10-17 DIAGNOSIS — I495 Sick sinus syndrome: Secondary | ICD-10-CM | POA: Diagnosis not present

## 2023-10-17 NOTE — Progress Notes (Signed)
Patient Care Team: Renford Dills, MD as PCP - General (Internal Medicine) Corky Crafts, MD as PCP - Cardiology (Cardiology) Duke Salvia, MD as Attending Physician (Cardiology) Corky Crafts, MD as Consulting Physician (Cardiology)   HPI  Joel Lin is a 82 y.o. male is seen in followup for aborted sudden cardiac death in the setting of ischemic heart disease with  prior PCI and mild depression of LV systolic function. He is status post ICD implantation;  generator change 2/18 with DDD upgrade secondary to symptomatic sinus bradycardia.  Appropriate therapy 7/16 with ventricular tachycardia failed termination with ATP and for which he was shocked. A second episode of monomorphic ventricular tachycardia terminated on its own. He was started on sotalol   Phantom shock 6/23; viral illness 5/24 with a fever to 102.9.  Arthritis issues on meloxicam for months stopped about a week ago.  Frequent PVCs at the last visit.  7/24.  Not sure what it was, but thought perhaps it was reversible.  Interval echo demonstrated interval normalization of LV function.  Asymptomatic with good exercise tolerance currently     DATE TEST EF   11/12    myoview    45 % Old inferolateral   1/14 echo   40 %   2/18 Echo  40-45%   8/24 Echo  60-65%    Date Cr K Mg Hgb  6/17  0.95 4.6  *   2/18  1.01 4.4 2.4   9/19 0.87 4.2    3/20   2.3   9/20 0.92 4.5     5/22 0.92 4.4 2.4   6/23 0.99 4.0 2.3 14.4  }5/24 0.94 4.1   13.8  7/24 0.83 4.7  13.8     Appropriate Therapy yes -- VT ATP  Inappropriate Therapy yes-- sinus tachycardia Antiarrhythmics Date  sotalol          Past Medical History:  Diagnosis Date   AAA (abdominal aortic aneurysm) (HCC)    AICD (automatic cardioverter/defibrillator) present    Arthritis    wrist   Cardiac arrest (HCC) 03/29/2008   a. resuscitated OOH VF arrest  b. s/p STJ ICD   Contrast media allergy    Coronary artery disease     myoview 2011>>EF of 53% / large area of old inferolateral infarct but no reversible ischemia           Hemorrhoid    Hypercoagulable state (HCC)    Questionable history of a borderline hypercoagulable state   Hyperlipidemia    Myocardial infarction (HCC) 12/13/1979   Peripheral vascular disease (HCC)    pt denies this   Pneumonia    years ago   PVCs (premature ventricular contractions)    occasionally   Ventricular tachycardia (HCC)    a. appropriate ICD therpay b. on Sotalol      1  Past Surgical History:  Procedure Laterality Date   ABDOMINAL AORTIC ENDOVASCULAR STENT GRAFT N/A 12/06/2013   Procedure: ABDOMINAL AORTIC ENDOVASCULAR STENT GRAFT;  Surgeon: Larina Earthly, MD;  Location[facility] OR;  Service: Vascular;  Laterality: N/A;   CARDIAC CATHETERIZATION  04/04/2008    LAD stent patent, D1 OK, OM1 90/80/80% (2mm vessel), RCA 30%; EF 35%.   CARDIAC DEFIBRILLATOR PLACEMENT  04/07/2008    STJ single chamber ICD implanted for secondary prevention   CHOLECYSTECTOMY     CHOLECYSTECTOMY, LAPAROSCOPIC  08/13/2009   Calculous cholecystitis   CORONARY ANGIOPLASTY WITH STENT PLACEMENT  1999  PTCA and stenting of his left anterior descending artery   ICD REVISION N/A 02/13/2017   Procedure: Upgrade to Dual Chamber ICD;  Surgeon: Will Jorja Loa, MD;  Location: [facility] INVASIVE CV LAB;  Service: Cardiovascular;  Laterality: N/A;   INGUINAL HERNIA REPAIR Right 08/24/2022   Procedure: RIGHT INGUINAL HERNIA REPAIR;  Surgeon: Harriette Bouillon, MD;  Location: [facility] OR;  Service: General;  Laterality: Right;  GEN & TAP BLOCK   INSERTION OF MESH Right 08/24/2022   Procedure: INSERTION OF MESH;  Surgeon: Harriette Bouillon, MD;  Location: [facility] OR;  Service: General;  Laterality: Right;   LEFT HEART CATHETERIZATION WITH CORONARY ANGIOGRAM N/A 02/28/2013   Procedure: LEFT HEART CATHETERIZATION WITH CORONARY ANGIOGRAM;  Surgeon: Tonny Bollman, MD;  Location: [facility] CATH LAB;  Service: Cardiovascular;  Laterality: N/A;    TONSILLECTOMY      Current Outpatient Medications  Medication Sig Dispense Refill   aspirin 81 MG tablet Take 81 mg by mouth every evening.     cholecalciferol (VITAMIN D) 1000 units tablet Take 1,000 Units by mouth daily.     Evolocumab (REPATHA SURECLICK) 140 MG/ML SOAJ Inject 140 mg into the skin every 14 (fourteen) days. 6 mL 3   fexofenadine (ALLEGRA) 180 MG tablet Take 180 mg by mouth daily.     fish oil-omega-3 fatty acids 1000 MG capsule Take 1 g by mouth daily.      fluticasone (FLONASE) 50 MCG/ACT nasal spray Place 1 spray into both nostrils daily as needed for allergies or rhinitis.     Glucosamine Sulfate-MSM (GLUCOSAMINE-MSM DS PO) Take 1 tablet by mouth daily.     ibuprofen (ADVIL,MOTRIN) 200 MG tablet Take 400 mg by mouth every 6 (six) hours as needed for moderate pain.     lisinopril (ZESTRIL) 2.5 MG tablet TAKE 1 TABLET DAILY 90 tablet 1   Melatonin 5 MG CAPS Take 5 mg by mouth at bedtime as needed (sleep).     Multiple Vitamin (MULTIVITAMIN WITH MINERALS) TABS Take 1 tablet by mouth daily.     nitroGLYCERIN (NITRODUR - DOSED IN MG/24 HR) 0.2 mg/hr patch APPLY 1 PATCH ONTO THE SKINDAILY 90 patch 1   nitroGLYCERIN (NITROSTAT) 0.4 MG SL tablet Place 1 tablet (0.4 mg total) under the tongue every 5 (five) minutes as needed for chest pain ((MAX 3 DOSES)). 25 tablet 6   polyethylene glycol (MIRALAX / GLYCOLAX) 17 g packet Take 17 g by mouth daily as needed for moderate constipation.     rosuvastatin (CRESTOR) 5 MG tablet Take 1 tablet (5 mg total) by mouth every other day. 45 tablet 3   sotalol (BETAPACE) 80 MG tablet TAKE 1 TABLET TWICE A DAY 180 tablet 1   spironolactone (ALDACTONE) 25 MG tablet TAKE 1/2 TABLET DAILY 45 tablet 3   No current facility-administered medications for this visit.    Allergies  Allergen Reactions   Epinephrine Other (See Comments)    Elevated heartbeat   Losartan Palpitations and Other (See Comments)    Irregular heartbeat   Metoprolol Other  (See Comments)    Irregular heartbeat   Mydriacyl [Tropicamide] Other (See Comments)    Elevated heartbeat   Phenylephrine Hcl (Pressors) Other (See Comments)    Increased heartrate   Bisoprolol Other (See Comments)    Feels it does not agree with him   Iodinated Contrast Media Hives   Clopidogrel Bisulfate Hives        Iodine Hives    "Renografin"   Other Other (See Comments)  Muscle cramps (crestor low dose is ok)   Statins Other (See Comments)    Muscle cramps (crestor low dose is ok)    Review of Systems negative except from HPI and PMH  Physical Exam BP 118/62   Pulse (!) 59   Ht 5' 8.5" (1.74 m)   Wt 164 lb 3.2 oz (74.5 kg)   SpO2 98%   BMI 24.60 kg/m  Well developed and well nourished in no acute distress HENT normal Neck supple with JVP-flat Clear Device pocket well healed; without hematoma or erythema.  There is no tethering  Irregular rate and rhythm, no  gallop No murmur Abd-soft with active BS No Clubbing cyanosis  edema Skin-warm and dry A & Oriented  Grossly normal sensory and motor function  ECG atrial paced rhythm at 50 with frequent intervening PVCs and ventricular pacing as escape  Device function is normal. Programming changes none  See Paceart for details    ECG atrial pacing at 60 with frequent PVCs and AR interval of about 300 ms  Device function is  normal. Programming changes none   See Paceart for details     Assessment and  Plan  Aborted cardiac arrest   Ischemic Cardiomyopathy  Ventricular tachycardia  High Risk Medication Surveillance Sotalol  Sinus node dysfunction first-degree AV block  Implantable defibrillator   Hyperlipidemia  Phantom Shock   PVCs   Continues with trigeminy.  However, he is without symptoms and his LVEF is surprisingly good.  We talked about trying to overdrive with a heart rate of 70, trying to suppress the PVCs with adjunctive ranolazine to his sotalol but given the current situation with  regard to symptoms and LVEF, we have elected to do nothing except repeat the echo in 4 months and reassess

## 2023-10-17 NOTE — Patient Instructions (Signed)
Medication Instructions:  Your physician recommends that you continue on your current medications as directed. Please refer to the Current Medication list given to you today.  *If you need a refill on your cardiac medications before your next appointment, please call your pharmacy*   Lab Work: None ordered.  If you have labs (blood work) drawn today and your tests are completely normal, you will receive your results only by: MyChart Message (if you have MyChart) OR A paper copy in the mail If you have any lab test that is abnormal or we need to change your treatment, we will call you to review the results.   Testing/Procedures: Echo in 4 months  Your physician has requested that you have an echocardiogram. Echocardiography is a painless test that uses sound waves to create images of your heart. It provides your doctor with information about the size and shape of your heart and how well your heart's chambers and valves are working. This procedure takes approximately one hour. There are no restrictions for this procedure. Please do NOT wear cologne, perfume, aftershave, or lotions (deodorant is allowed). Please arrive 15 minutes prior to your appointment time.  Please note: We ask at that you not bring children with you during ultrasound (echo/ vascular) testing. Due to room size and safety concerns, children are not allowed in the ultrasound rooms during exams. Our front office staff cannot provide observation of children in our lobby area while testing is being conducted. An adult accompanying a patient to their appointment will only be allowed in the ultrasound room at the discretion of the ultrasound technician under special circumstances. We apologize for any inconvenience.]    Follow-Up: At [facility], you and your health needs are our priority.  As part of our continuing mission to provide you with exceptional heart care, we have created designated Provider Care Teams.   These Care Teams include your primary Cardiologist (physician) and Advanced Practice Providers (APPs -  Physician Assistants and Nurse Practitioners) who all work together to provide you with the care you need, when you need it.  We recommend signing up for the patient portal called "MyChart".  Sign up information is provided on this After Visit Summary.  MyChart is used to connect with patients for Virtual Visits (Telemedicine).  Patients are able to view lab/test results, encounter notes, upcoming appointments, etc.  Non-urgent messages can be sent to your provider as well.   To learn more about what you can do with MyChart, go to [email or web address].    Your next appointment:    5 months with Dr Graciela Husbands

## 2023-11-07 ENCOUNTER — Ambulatory Visit (INDEPENDENT_AMBULATORY_CARE_PROVIDER_SITE_OTHER): Payer: Medicare HMO

## 2023-11-07 DIAGNOSIS — I472 Ventricular tachycardia, unspecified: Secondary | ICD-10-CM

## 2023-11-07 DIAGNOSIS — I495 Sick sinus syndrome: Secondary | ICD-10-CM

## 2023-11-07 LAB — CUP PACEART REMOTE DEVICE CHECK
Battery Remaining Longevity: 28 mo
Battery Remaining Percentage: 31 %
Battery Voltage: 2.84 V
Brady Statistic AP VP Percent: 40 %
Brady Statistic AP VS Percent: 16 %
Brady Statistic AS VP Percent: 16 %
Brady Statistic AS VS Percent: 9 %
Brady Statistic RA Percent Paced: 39 %
Brady Statistic RV Percent Paced: 55 %
Date Time Interrogation Session: 20241126020017
HighPow Impedance: 46 Ohm
HighPow Impedance: 47 Ohm
Implantable Lead Connection Status: 753985
Implantable Lead Connection Status: 753985
Implantable Lead Implant Date: 20090427
Implantable Lead Implant Date: 20180305
Implantable Lead Location: 753859
Implantable Lead Location: 753860
Implantable Lead Model: 7121
Implantable Pulse Generator Implant Date: 20180305
Lead Channel Impedance Value: 310 Ohm
Lead Channel Impedance Value: 400 Ohm
Lead Channel Pacing Threshold Amplitude: 0.625 V
Lead Channel Pacing Threshold Amplitude: 1.5 V
Lead Channel Pacing Threshold Pulse Width: 0.5 ms
Lead Channel Pacing Threshold Pulse Width: 0.5 ms
Lead Channel Sensing Intrinsic Amplitude: 11.5 mV
Lead Channel Sensing Intrinsic Amplitude: 3.7 mV
Lead Channel Setting Pacing Amplitude: 1.625
Lead Channel Setting Pacing Amplitude: 1.75 V
Lead Channel Setting Pacing Pulse Width: 0.5 ms
Lead Channel Setting Sensing Sensitivity: 0.5 mV
Pulse Gen Serial Number: 1247854

## 2023-11-08 DIAGNOSIS — R1031 Right lower quadrant pain: Secondary | ICD-10-CM | POA: Diagnosis not present

## 2023-11-08 DIAGNOSIS — R109 Unspecified abdominal pain: Secondary | ICD-10-CM | POA: Diagnosis not present

## 2023-11-09 ENCOUNTER — Other Ambulatory Visit: Payer: Self-pay | Admitting: Interventional Cardiology

## 2023-11-20 DIAGNOSIS — Z961 Presence of intraocular lens: Secondary | ICD-10-CM | POA: Diagnosis not present

## 2023-11-20 DIAGNOSIS — H25812 Combined forms of age-related cataract, left eye: Secondary | ICD-10-CM | POA: Diagnosis not present

## 2023-11-20 DIAGNOSIS — H2512 Age-related nuclear cataract, left eye: Secondary | ICD-10-CM | POA: Diagnosis not present

## 2023-11-21 ENCOUNTER — Ambulatory Visit: Payer: Medicare HMO | Admitting: Interventional Cardiology

## 2023-12-04 DIAGNOSIS — Z961 Presence of intraocular lens: Secondary | ICD-10-CM | POA: Diagnosis not present

## 2023-12-04 DIAGNOSIS — H25811 Combined forms of age-related cataract, right eye: Secondary | ICD-10-CM | POA: Diagnosis not present

## 2023-12-04 DIAGNOSIS — H2511 Age-related nuclear cataract, right eye: Secondary | ICD-10-CM | POA: Diagnosis not present

## 2023-12-07 NOTE — Progress Notes (Signed)
Remote ICD transmission.   

## 2023-12-18 ENCOUNTER — Encounter: Payer: Self-pay | Admitting: Cardiovascular Disease

## 2023-12-18 NOTE — Progress Notes (Signed)
  Cardiology Office Note:  .   Date:  12/20/2023  ID:  Joel Lin, DOB 08/05/1941, MRN [id number] PCP: Rexanne Ingle, MD  [facility] Providers Cardiologist:  Candyce Reek, MD {  History of Present Illness: .    Jan. 8, 2025 Joel Lin is a 83 y.o. male , former patient of Dr. Reek. I met him in 1999 - I placed a coronary  stent   Hx of HLD, CAD, complete heart block ,  now has a ICD / pacer   His first MI was at age 62.    Is on Repatha   His last chol was 89 LDL was 20   Echo from Aug. 2024 shows LVEF of 60-65%  Grade II  DD  Has not been exercising for the past 2 months dur to cataract surgery.      Was a purchaser for [employer]     ROS:   Studies Reviewed: .         Risk Assessment/Calculations:             Physical Exam:   VS:  BP 135/80   Pulse 60   Ht 5' 8.5 (1.74 m)   Wt 164 lb 6.4 oz (74.6 kg)   SpO2 98%   BMI 24.63 kg/m    Wt Readings from Last 3 Encounters:  12/20/23 164 lb 6.4 oz (74.6 kg)  10/17/23 164 lb 3.2 oz (74.5 kg)  07/11/23 167 lb 6.4 oz (75.9 kg)    GEN: Well nourished, well developed in no acute distress NECK: No JVD; No carotid bruits CARDIAC: RRR, no murmurs, rubs, gallops RESPIRATORY:  Clear to auscultation without rales, wheezing or rhonchi  ABDOMEN: Soft, non-tender, non-distended EXTREMITIES:  No edema; No deformity   ASSESSMENT AND PLAN: .    1.  History of coronary artery disease: He had his first myocardial infarction at age 52.  I placed a coronary stent in 1999.  He seems to be doing well.  Is not having any episodes of angina.  His lipids look great.  His last LDL is 20.  2.  Hyperlipidemia: He is currently on Repatha  and rosuvastatin  5 mg a day.  His last LDL is 20.  3.  History of ventricular tachycardia: He is followed by Dr. [name].  He has an ICD in place.  He is on sotalol .  He will continue to follow-up with Dr. [name].  4.  History of HFrEF : His LVEF has improved and now is  normal.       Dispo: 1 year with Dr. Barbaraann   Signed, Aleene Passe, MD

## 2023-12-20 ENCOUNTER — Ambulatory Visit: Payer: Medicare HMO | Attending: Interventional Cardiology | Admitting: Cardiovascular Disease

## 2023-12-20 ENCOUNTER — Encounter: Payer: Self-pay | Admitting: Cardiovascular Disease

## 2023-12-20 VITALS — BP 135/80 | HR 60 | Ht 68.5 in | Wt 164.4 lb

## 2023-12-20 DIAGNOSIS — E782 Mixed hyperlipidemia: Secondary | ICD-10-CM

## 2023-12-20 DIAGNOSIS — I10 Essential (primary) hypertension: Secondary | ICD-10-CM | POA: Diagnosis not present

## 2023-12-20 DIAGNOSIS — I2510 Atherosclerotic heart disease of native coronary artery without angina pectoris: Secondary | ICD-10-CM | POA: Diagnosis not present

## 2023-12-20 DIAGNOSIS — I252 Old myocardial infarction: Secondary | ICD-10-CM

## 2023-12-20 DIAGNOSIS — J069 Acute upper respiratory infection, unspecified: Secondary | ICD-10-CM | POA: Diagnosis not present

## 2023-12-20 DIAGNOSIS — I5022 Chronic systolic (congestive) heart failure: Secondary | ICD-10-CM | POA: Diagnosis not present

## 2023-12-20 NOTE — Patient Instructions (Signed)
 Medication Instructions:  Your physician recommends that you continue on your current medications as directed. Please refer to the Current Medication list given to you today.  *If you need a refill on your cardiac medications before your next appointment, please call your pharmacy*  Lab Work: None ordered today.  Testing/Procedures: None ordered today.  Follow-Up: At [facility], you and your health needs are our priority.  As part of our continuing mission to provide you with exceptional heart care, we have created designated Provider Care Teams.  These Care Teams include your primary Cardiologist (physician) and Advanced Practice Providers (APPs -  Physician Assistants and Nurse Practitioners) who all work together to provide you with the care you need, when you need it.  Your next appointment:   Your physician wants you to follow-up in: 1 year. You will receive a reminder letter in the mail two months in advance. If you don't receive a letter, please call our office to schedule the follow-up appointment.   The format for your next appointment:   In Person  Provider:   Darryle Decent, MD {

## 2023-12-25 ENCOUNTER — Other Ambulatory Visit: Payer: Self-pay | Admitting: Internal Medicine

## 2024-01-17 DIAGNOSIS — I2510 Atherosclerotic heart disease of native coronary artery without angina pectoris: Secondary | ICD-10-CM | POA: Diagnosis not present

## 2024-01-17 DIAGNOSIS — I10 Essential (primary) hypertension: Secondary | ICD-10-CM | POA: Diagnosis not present

## 2024-01-17 DIAGNOSIS — I255 Ischemic cardiomyopathy: Secondary | ICD-10-CM | POA: Diagnosis not present

## 2024-01-17 DIAGNOSIS — E7800 Pure hypercholesterolemia, unspecified: Secondary | ICD-10-CM | POA: Diagnosis not present

## 2024-01-27 ENCOUNTER — Other Ambulatory Visit: Payer: Self-pay | Admitting: Internal Medicine

## 2024-02-06 ENCOUNTER — Ambulatory Visit (INDEPENDENT_AMBULATORY_CARE_PROVIDER_SITE_OTHER): Payer: Medicare HMO

## 2024-02-06 DIAGNOSIS — I495 Sick sinus syndrome: Secondary | ICD-10-CM | POA: Diagnosis not present

## 2024-02-07 LAB — CUP PACEART REMOTE DEVICE CHECK
Battery Remaining Longevity: 26 mo
Battery Remaining Percentage: 29 %
Battery Voltage: 2.83 V
Brady Statistic AP VP Percent: 38 %
Brady Statistic AP VS Percent: 17 %
Brady Statistic AS VP Percent: 15 %
Brady Statistic AS VS Percent: 11 %
Brady Statistic RA Percent Paced: 39 %
Brady Statistic RV Percent Paced: 53 %
Date Time Interrogation Session: 20250225020019
HighPow Impedance: 49 Ohm
HighPow Impedance: 49 Ohm
Implantable Lead Connection Status: 753985
Implantable Lead Connection Status: 753985
Implantable Lead Implant Date: 20090427
Implantable Lead Implant Date: 20180305
Implantable Lead Location: 753859
Implantable Lead Location: 753860
Implantable Lead Model: 7121
Implantable Pulse Generator Implant Date: 20180305
Lead Channel Impedance Value: 380 Ohm
Lead Channel Impedance Value: 400 Ohm
Lead Channel Pacing Threshold Amplitude: 0.5 V
Lead Channel Pacing Threshold Amplitude: 1.5 V
Lead Channel Pacing Threshold Pulse Width: 0.5 ms
Lead Channel Pacing Threshold Pulse Width: 0.5 ms
Lead Channel Sensing Intrinsic Amplitude: 12 mV
Lead Channel Sensing Intrinsic Amplitude: 2.1 mV
Lead Channel Setting Pacing Amplitude: 1.5 V
Lead Channel Setting Pacing Amplitude: 1.75 V
Lead Channel Setting Pacing Pulse Width: 0.5 ms
Lead Channel Setting Sensing Sensitivity: 0.5 mV
Pulse Gen Serial Number: 1247854

## 2024-02-18 ENCOUNTER — Other Ambulatory Visit: Payer: Self-pay | Admitting: Interventional Cardiology

## 2024-02-27 ENCOUNTER — Ambulatory Visit (HOSPITAL_COMMUNITY): Payer: Medicare HMO | Attending: Internal Medicine

## 2024-02-27 ENCOUNTER — Encounter: Payer: Self-pay | Admitting: Internal Medicine

## 2024-02-27 DIAGNOSIS — I472 Ventricular tachycardia, unspecified: Secondary | ICD-10-CM | POA: Insufficient documentation

## 2024-02-27 DIAGNOSIS — I255 Ischemic cardiomyopathy: Secondary | ICD-10-CM | POA: Diagnosis not present

## 2024-02-27 LAB — ECHOCARDIOGRAM COMPLETE
Area-P 1/2: 4.01 cm2
MV M vel: 4.33 m/s
MV Peak grad: 75 mmHg
Radius: 0.7 cm
S' Lateral: 4.3 cm

## 2024-03-05 ENCOUNTER — Ambulatory Visit: Payer: Medicare HMO | Attending: Internal Medicine | Admitting: Internal Medicine

## 2024-03-05 ENCOUNTER — Encounter: Payer: Self-pay | Admitting: Internal Medicine

## 2024-03-05 VITALS — BP 142/70 | HR 59 | Resp 16 | Ht 68.0 in | Wt 164.6 lb

## 2024-03-05 DIAGNOSIS — I255 Ischemic cardiomyopathy: Secondary | ICD-10-CM

## 2024-03-05 DIAGNOSIS — Z4502 Encounter for adjustment and management of automatic implantable cardiac defibrillator: Secondary | ICD-10-CM | POA: Diagnosis not present

## 2024-03-05 DIAGNOSIS — Z79899 Other long term (current) drug therapy: Secondary | ICD-10-CM | POA: Diagnosis not present

## 2024-03-05 DIAGNOSIS — I472 Ventricular tachycardia, unspecified: Secondary | ICD-10-CM

## 2024-03-05 DIAGNOSIS — I493 Ventricular premature depolarization: Secondary | ICD-10-CM

## 2024-03-05 DIAGNOSIS — I495 Sick sinus syndrome: Secondary | ICD-10-CM

## 2024-03-05 MED ORDER — MEXILETINE HCL 200 MG PO CAPS: 200.0000 mg | ORAL_CAPSULE | Freq: Two times a day (BID) | ORAL | 1 refills | Status: DC

## 2024-03-05 MED ORDER — ENTRESTO 24-26 MG PO TABS: 1.0000 | ORAL_TABLET | Freq: Two times a day (BID) | ORAL | 1 refills | Status: DC

## 2024-03-05 NOTE — Patient Instructions (Signed)
 Medication Instructions:  Your physician has recommended you make the following change in your medication:   ** Stop Lisinopril  **  Begin Entresto 24-26mg  - 1 tablet by mouth twice daily  ** Begin Mexiletine 200mg  - 1 capsule by mouth twice daily   *If you need a refill on your cardiac medications before your next appointment, please call your pharmacy*   Lab Work: BMET and Mg If you have labs (blood work) drawn today and your tests are completely normal, you will receive your results only by: MyChart Message (if you have MyChart) OR A paper copy in the mail If you have any lab test that is abnormal or we need to change your treatment, we will call you to review the results.   Testing/Procedures: Echo in 3 months Your physician has requested that you have an echocardiogram. Echocardiography is a painless test that uses sound waves to create images of your heart. It provides your doctor with information about the size and shape of your heart and how well your heart's chambers and valves are working. This procedure takes approximately one hour. There are no restrictions for this procedure. Please do NOT wear cologne, perfume, aftershave, or lotions (deodorant is allowed). Please arrive 15 minutes prior to your appointment time.  Please note: We ask at that you not bring children with you during ultrasound (echo/ vascular) testing. Due to room size and safety concerns, children are not allowed in the ultrasound rooms during exams. Our front office staff cannot provide observation of children in our lobby area while testing is being conducted. An adult accompanying a patient to their appointment will only be allowed in the ultrasound room at the discretion of the ultrasound technician under special circumstances. We apologize for any inconvenience.     Follow-Up: At [facility], you and your health needs are our priority.  As part of our continuing mission to provide you with  exceptional heart care, we have created designated Provider Care Teams.  These Care Teams include your primary Cardiologist (physician) and Advanced Practice Providers (APPs -  Physician Assistants and Nurse Practitioners) who all work together to provide you with the care you need, when you need it.  We recommend signing up for the patient portal called "MyChart".  Sign up information is provided on this After Visit Summary.  MyChart is used to connect with patients for Virtual Visits (Telemedicine).  Patients are able to view lab/test results, encounter notes, upcoming appointments, etc.  Non-urgent messages can be sent to your provider as well.   To learn more about what you can do with MyChart, go to [email or web address].    Your next appointment:   6 months

## 2024-03-05 NOTE — Progress Notes (Unsigned)
 Patient Care Team: Renford Dills, MD as PCP - General (Internal Medicine) Corky Crafts, MD as PCP - Cardiology (Cardiology) Duke Salvia, MD as Attending Physician (Cardiology) Corky Crafts, MD as Consulting Physician (Cardiology)   HPI  Joel Lin is a 83 y.o. male is seen in followup for aborted sudden cardiac death in the setting of ischemic heart disease with  prior PCI and mild depression of LV systolic function. He is status post ICD [employer]  implantation;  generator change 2/18 with DDD upgrade secondary to symptomatic sinus bradycardia.  Appropriate therapy 7/16 with ventricular tachycardia failed termination with ATP and for which he was shocked. A second episode of monomorphic ventricular tachycardia terminated on its own. He was started on sotalol   Phantom shock 6/23;   Frequent PVCs at the last visit.  7/24.  Not sure what it was, but thought perhaps it was reversible.  Interval echo demonstrated interval normalization of LV function.   The plan is to reassess left ventricular function in a few months given the PVC burden  The patient denies chest pain, shortness of breath, nocturnal dyspnea, orthopnea or peripheral edema.  There have been no palpitations, lightheadedness or syncope.      DATE TEST EF   11/12    myoview    45 % Old inferolateral   1/14 echo   40 %   2/18 Echo  40-45%   8/24 Echo  60-65%   3/25 Echo  35-40% LAE severe   Date Cr K Mg Hgb  6/17  0.95 4.6  *   2/18  1.01 4.4 2.4   9/19 0.87 4.2    3/20   2.3   9/20 0.92 4.5     5/22 0.92 4.4 2.4   6/23 0.99 4.0 2.3 14.4  }5/24 0.94 4.1   13.8  7/24 0.83 4.7  13.8           Appropriate Therapy yes -- VT ATP  Inappropriate Therapy yes-- sinus tachycardia Antiarrhythmics Date  sotalol          Past Medical History:  Diagnosis Date   AAA (abdominal aortic aneurysm) (HCC)    AICD (automatic cardioverter/defibrillator) present    Arthritis    wrist    Cardiac arrest (HCC) 03/29/2008   a. resuscitated OOH VF arrest  b. s/p STJ ICD   Contrast media allergy    Coronary artery disease    myoview 2011>>EF of 53% / large area of old inferolateral infarct but no reversible ischemia           Hemorrhoid    Hypercoagulable state (HCC)    Questionable history of a borderline hypercoagulable state   Hyperlipidemia    Myocardial infarction (HCC) 12/13/1979   Peripheral vascular disease (HCC)    pt denies this   Pneumonia    years ago   PVCs (premature ventricular contractions)    occasionally   Ventricular tachycardia (HCC)    a. appropriate ICD therpay b. on Sotalol      1  Past Surgical History:  Procedure Laterality Date   ABDOMINAL AORTIC ENDOVASCULAR STENT GRAFT N/A 12/06/2013   Procedure: ABDOMINAL AORTIC ENDOVASCULAR STENT GRAFT;  Surgeon: Larina Earthly, MD;  Location: [facility] OR;  Service: Vascular;  Laterality: N/A;   CARDIAC CATHETERIZATION  04/04/2008    LAD stent patent, D1 OK, OM1 90/80/80% (2mm vessel), RCA 30%; EF 35%.   CARDIAC DEFIBRILLATOR PLACEMENT  04/07/2008    STJ  single chamber ICD implanted for secondary prevention   CHOLECYSTECTOMY     CHOLECYSTECTOMY, LAPAROSCOPIC  08/13/2009   Calculous cholecystitis   CORONARY ANGIOPLASTY WITH STENT PLACEMENT  1999   PTCA and stenting of his left anterior descending artery   ICD REVISION N/A 02/13/2017   Procedure: Upgrade to Dual Chamber ICD;  Surgeon: Will Jorja Loa, MD;  Location: [facility] INVASIVE CV LAB;  Service: Cardiovascular;  Laterality: N/A;   INGUINAL HERNIA REPAIR Right 08/24/2022   Procedure: RIGHT INGUINAL HERNIA REPAIR;  Surgeon: Harriette Bouillon, MD;  Location: [facility] OR;  Service: General;  Laterality: Right;  GEN & TAP BLOCK   INSERTION OF MESH Right 08/24/2022   Procedure: INSERTION OF MESH;  Surgeon: Harriette Bouillon, MD;  Location: [facility] OR;  Service: General;  Laterality: Right;   LEFT HEART CATHETERIZATION WITH CORONARY ANGIOGRAM N/A 02/28/2013   Procedure: LEFT HEART  CATHETERIZATION WITH CORONARY ANGIOGRAM;  Surgeon: Tonny Bollman, MD;  Location: [facility] CATH LAB;  Service: Cardiovascular;  Laterality: N/A;   TONSILLECTOMY      Current Outpatient Medications  Medication Sig Dispense Refill   aspirin 81 MG tablet Take 81 mg by mouth every evening.     cholecalciferol (VITAMIN D) 1000 units tablet Take 1,000 Units by mouth daily.     Evolocumab (REPATHA SURECLICK) 140 MG/ML SOAJ INJECT 140 MG INTO THE SKIN EVERY 14 (FOURTEEN) DAYS. 6 mL 3   fexofenadine (ALLEGRA) 180 MG tablet Take 180 mg by mouth daily.     fish oil-omega-3 fatty acids 1000 MG capsule Take 1 g by mouth daily.      fluticasone (FLONASE) 50 MCG/ACT nasal spray Place 1 spray into both nostrils daily as needed for allergies or rhinitis.     Glucosamine Sulfate-MSM (GLUCOSAMINE-MSM DS PO) Take 1 tablet by mouth daily.     ibuprofen (ADVIL,MOTRIN) 200 MG tablet Take 400 mg by mouth every 6 (six) hours as needed for moderate pain.     lisinopril (ZESTRIL) 2.5 MG tablet TAKE 1 TABLET DAILY 90 tablet 3   Melatonin 5 MG CAPS Take 5 mg by mouth at bedtime as needed (sleep).     Multiple Vitamin (MULTIVITAMIN WITH MINERALS) TABS Take 1 tablet by mouth daily.     nitroGLYCERIN (NITRODUR - DOSED IN MG/24 HR) 0.2 mg/hr patch APPLY 1 PATCH ONTO THE SKINDAILY 90 patch 3   nitroGLYCERIN (NITROSTAT) 0.4 MG SL tablet Place 1 tablet (0.4 mg total) under the tongue every 5 (five) minutes as needed for chest pain ((MAX 3 DOSES)). 25 tablet 6   polyethylene glycol (MIRALAX / GLYCOLAX) 17 g packet Take 17 g by mouth daily as needed for moderate constipation.     rosuvastatin (CRESTOR) 5 MG tablet Take 1 tablet (5 mg total) by mouth every other day. 45 tablet 3   sotalol (BETAPACE) 80 MG tablet TAKE 1 TABLET TWICE A DAY 180 tablet 3   spironolactone (ALDACTONE) 25 MG tablet TAKE 1/2 TABLET DAILY 45 tablet 3   No current facility-administered medications for this visit.    Allergies  Allergen Reactions    Epinephrine Other (See Comments)    Elevated heartbeat   Losartan Palpitations and Other (See Comments)    Irregular heartbeat   Metoprolol Other (See Comments)    Irregular heartbeat   Mydriacyl [Tropicamide] Other (See Comments)    Elevated heartbeat   Phenylephrine Hcl (Pressors) Other (See Comments)    Increased heartrate   Bisoprolol Other (See Comments)    Feels it does not agree  with him   Iodinated Contrast Media Hives   Clopidogrel Bisulfate Hives        Iodine Hives    "Renografin"   Other Other (See Comments)    Muscle cramps (crestor low dose is ok)   Statins Other (See Comments)    Muscle cramps (crestor low dose is ok)    Review of Systems negative except from HPI and PMH  Physical Exam BP (!) 142/70 (BP Location: Left Arm, Patient Position: Sitting, Cuff Size: Normal)   Pulse (!) 59   Resp 16   Ht 5\' 8"  (1.727 m)   Wt 164 lb 9.6 oz (74.7 kg)   SpO2 97%   BMI 25.03 kg/m  Well developed and well nourished in no acute distress HENT normal Neck supple with JVP-flat Clear Device pocket well healed; without hematoma or erythema.  There is no tethering  Regular rate and rhythm, no  murmur Abd-soft with active BS No Clubbing cyanosis  edema Skin-warm and dry A & Oriented  Grossly normal sensory and motor function  ECG sinus with V pacing with VIP search active   Device function is normal. Programming changes   See Paceart for details     Assessment and  Plan  Aborted cardiac arrest   Ischemic Cardiomyopathy interval recovery now worsening.  Ventricular tachycardia  High Risk Medication Surveillance Sotalol  Sinus node dysfunction first-degree AV block  Implantable defibrillator   Hyperlipidemia  Phantom Shock   PVCs     Worsening cardiomyopathy in the context of a increasing burden of PVCs and ventricular pacing.  Either could be responsible, but I suspect the former.  We will undertake a mexiletine trial at 200 mg twice daily.  He  will let us know over the next couple of weeks whether by palpation his PVC burden is less.  If yes and he is tolerating the drug we will plan an echo in 3 months.  If not less and tolerating the drug we will increase it and is not tolerating the drug will have to go to Plan B.  With his cardiomyopathy, we will also transition from lisinopril to [facility].  His losartan is listed as an intolerance because of palpitations.  Not sure what this would be.  Reviewed with him the importance of a 48-hour window.  This will also impact his blood pressure which is elevated today in advantageous way.  His cardiomyopathy could also be related to ventricular pacing.  If the PVCs are obliterated there is no change, we may need to think about CRT upgrade.  He is already at maximal AV conduction at 300+ VIP 150.  We discussed increasing his heart rate but I worry about increasing ventricular pacing burden.  We will see whether he has sleep mode on his device, we might try to decrease this to 40 or 45 bpm and thereby decreasing the ventricular pacing burden. Need to check sotalol surveillance labs

## 2024-03-06 ENCOUNTER — Other Ambulatory Visit (HOSPITAL_COMMUNITY): Payer: Self-pay

## 2024-03-06 ENCOUNTER — Telehealth: Payer: Self-pay | Admitting: Pharmacy Technician

## 2024-03-06 LAB — BASIC METABOLIC PANEL
BUN/Creatinine Ratio: 22 (ref 10–24)
BUN: 20 mg/dL (ref 8–27)
CO2: 24 mmol/L (ref 20–29)
Calcium: 9.4 mg/dL (ref 8.6–10.2)
Chloride: 102 mmol/L (ref 96–106)
Creatinine, Ser: 0.9 mg/dL (ref 0.76–1.27)
Glucose: 99 mg/dL (ref 70–99)
Potassium: 4.6 mmol/L (ref 3.5–5.2)
Sodium: 141 mmol/L (ref 134–144)
eGFR: 86 mL/min/{1.73_m2} (ref >59)

## 2024-03-06 LAB — MAGNESIUM: Magnesium: 2.2 mg/dL (ref 1.6–2.3)

## 2024-03-06 NOTE — Telephone Encounter (Signed)
 Pharmacy Patient Advocate Encounter  Received notification from [employer] that Prior Authorization for Mexiletine has been APPROVED from 12/13/23 to 12/11/24   PA #/Case ID/Reference #: [id number]

## 2024-03-06 NOTE — Telephone Encounter (Signed)
 Pharmacy Patient Advocate Encounter   Received notification from Fax that prior authorization for mexiletine is required/requested.   Insurance verification completed.   The patient is insured through [employer] .   Per test claim: PA required; PA submitted to above mentioned insurance via Fax Key/confirmation #/EOC x Status is pending

## 2024-03-08 ENCOUNTER — Encounter: Payer: Self-pay | Admitting: Pharmacy Technician

## 2024-03-08 ENCOUNTER — Telehealth: Payer: Self-pay | Admitting: Pharmacy Technician

## 2024-03-08 ENCOUNTER — Telehealth: Payer: Self-pay | Admitting: Pharmacist

## 2024-03-08 NOTE — Telephone Encounter (Signed)
 Patient called and LVM on my machine asking if he could get some help with Entresto cost. He has grant with [employer] for cholesterol already. Team can you please get him grant for cardiomyopathy?

## 2024-03-08 NOTE — Telephone Encounter (Signed)
 Patient Advocate Encounter   The patient was approved for a [employer] grant that will help cover the cost of Entresto Total amount awarded, 10,000.00.  Effective: 02/07/24 - 02/05/25   WUJ:[id number] NWG:[id number] QMVHQ:[id number] [employer] ID: [id number]   Pharmacy provided with approval and processing information. Patient informed via mychart

## 2024-03-13 NOTE — Addendum Note (Signed)
 Addended by: Elease Etienne A on: 03/13/2024 01:20 PM   Modules accepted: Orders

## 2024-03-13 NOTE — Progress Notes (Signed)
 Remote ICD transmission.

## 2024-03-19 DIAGNOSIS — D225 Melanocytic nevi of trunk: Secondary | ICD-10-CM | POA: Diagnosis not present

## 2024-03-19 DIAGNOSIS — L570 Actinic keratosis: Secondary | ICD-10-CM | POA: Diagnosis not present

## 2024-03-19 DIAGNOSIS — L578 Other skin changes due to chronic exposure to nonionizing radiation: Secondary | ICD-10-CM | POA: Diagnosis not present

## 2024-03-19 DIAGNOSIS — L821 Other seborrheic keratosis: Secondary | ICD-10-CM | POA: Diagnosis not present

## 2024-03-19 DIAGNOSIS — L89150 Pressure ulcer of sacral region, unstageable: Secondary | ICD-10-CM | POA: Diagnosis not present

## 2024-03-19 DIAGNOSIS — L814 Other melanin hyperpigmentation: Secondary | ICD-10-CM | POA: Diagnosis not present

## 2024-04-08 ENCOUNTER — Telehealth: Payer: Self-pay | Admitting: Internal Medicine

## 2024-04-08 NOTE — Telephone Encounter (Signed)
 Patient identification verified by 2 forms.   Called and spoke to patient  Patient states:  -PVC are 90-95% better.  -Still occur but are less frequent.  -Any changes to medications? -If changes please send [facility] [address] not mail order.    Will get message to Dr. Rodolfo Clan for review.   Patient agrees with plan, no questions at this time

## 2024-04-08 NOTE — Telephone Encounter (Signed)
 Patient was calling to speak to the nurse in regards to the a month f/u. Please advise

## 2024-04-16 DIAGNOSIS — H5712 Ocular pain, left eye: Secondary | ICD-10-CM | POA: Diagnosis not present

## 2024-04-16 NOTE — Telephone Encounter (Signed)
 Spoke with pt and advised no medication changes at this time and plan to keep echo appointment as scheduled for 05/21/24.

## 2024-04-17 DIAGNOSIS — Z23 Encounter for immunization: Secondary | ICD-10-CM | POA: Diagnosis not present

## 2024-04-17 DIAGNOSIS — I5022 Chronic systolic (congestive) heart failure: Secondary | ICD-10-CM | POA: Diagnosis not present

## 2024-04-23 ENCOUNTER — Telehealth: Payer: Self-pay | Admitting: Internal Medicine

## 2024-04-23 MED ORDER — ENTRESTO 24-26 MG PO TABS: 1.0000 | ORAL_TABLET | Freq: Two times a day (BID) | ORAL | 3 refills | Status: AC

## 2024-04-23 NOTE — Telephone Encounter (Signed)
 Pt's medication was sent to pt's pharmacy as requested. Confirmation received.

## 2024-04-23 NOTE — Telephone Encounter (Signed)
 Pt c/o medication issue:  1. Name of Medication: Entresto   2. How are you currently taking this medication (dosage and times per day)?   3. Are you having a reaction (difficulty breathing--STAT)?   4. What is your medication issue?  Patient wants to know if Dr Rodolfo Clan wants him to continue taking this medicine? If so, he will need a refill called in     *STAT* If patient is at the pharmacy, call can be transferred to refill team.   1. Which medications need to be refilled? (please list name of each medication and dose if known) Entresto    2. Would you like to learn more about the convenience, safety, & potential cost savings by using the [facility]?      3. Are you open to using the [facility] (Type [facility].    4. Which pharmacy/location (including street and city if local pharmacy) is medication to be sent to?[facility]  [address] [place], [place]    5. Do they need a 30 day or 90 day supply? 90 days and refills

## 2024-04-26 ENCOUNTER — Telehealth: Payer: Self-pay | Admitting: Interventional Cardiology

## 2024-04-26 NOTE — Telephone Encounter (Signed)
 Pt c/o medication issue:  1. Name of Medication:   sacubitril-valsartan (ENTRESTO ) 24-26 MG    2. How are you currently taking this medication (dosage and times per day)?    3. Are you having a reaction (difficulty breathing--STAT)? no  4. What is your medication issue? Patient states that he is having trouble with getting this medication.. calling to see what other options they are. They are not accepting his grant. Please advise

## 2024-04-26 NOTE — Telephone Encounter (Signed)
 Called pt, reports the Entresto  coverage problem has been solved. Pharmacy was able to successfully process grant.

## 2024-05-07 ENCOUNTER — Ambulatory Visit (INDEPENDENT_AMBULATORY_CARE_PROVIDER_SITE_OTHER): Payer: Medicare HMO

## 2024-05-07 DIAGNOSIS — I255 Ischemic cardiomyopathy: Secondary | ICD-10-CM | POA: Diagnosis not present

## 2024-05-07 DIAGNOSIS — I472 Ventricular tachycardia, unspecified: Secondary | ICD-10-CM

## 2024-05-08 ENCOUNTER — Ambulatory Visit: Payer: Self-pay | Admitting: Cardiology

## 2024-05-08 LAB — CUP PACEART REMOTE DEVICE CHECK
Battery Remaining Longevity: 25 mo
Battery Remaining Percentage: 26 %
Battery Voltage: 2.81 V
Brady Statistic AP VP Percent: 17 %
Brady Statistic AP VS Percent: 6.6 %
Brady Statistic AS VP Percent: 45 %
Brady Statistic AS VS Percent: 29 %
Brady Statistic RA Percent Paced: 22 %
Brady Statistic RV Percent Paced: 62 %
Date Time Interrogation Session: 20250527020017
HighPow Impedance: 49 Ohm
HighPow Impedance: 49 Ohm
Implantable Lead Connection Status: 753985
Implantable Lead Connection Status: 753985
Implantable Lead Implant Date: 20090427
Implantable Lead Implant Date: 20180305
Implantable Lead Location: 753859
Implantable Lead Location: 753860
Implantable Lead Model: 7121
Implantable Pulse Generator Implant Date: 20180305
Lead Channel Impedance Value: 330 Ohm
Lead Channel Impedance Value: 410 Ohm
Lead Channel Pacing Threshold Amplitude: 0.5 V
Lead Channel Pacing Threshold Amplitude: 1.5 V
Lead Channel Pacing Threshold Pulse Width: 0.5 ms
Lead Channel Pacing Threshold Pulse Width: 0.5 ms
Lead Channel Sensing Intrinsic Amplitude: 1.8 mV
Lead Channel Sensing Intrinsic Amplitude: 11 mV
Lead Channel Setting Pacing Amplitude: 1.5 V
Lead Channel Setting Pacing Amplitude: 1.75 V
Lead Channel Setting Pacing Pulse Width: 0.5 ms
Lead Channel Setting Sensing Sensitivity: 0.5 mV
Pulse Gen Serial Number: 1247854

## 2024-05-14 DIAGNOSIS — R059 Cough, unspecified: Secondary | ICD-10-CM | POA: Diagnosis not present

## 2024-05-21 ENCOUNTER — Ambulatory Visit (HOSPITAL_COMMUNITY)
Admission: RE | Admit: 2024-05-21 | Discharge: 2024-05-21 | Disposition: A | Discharge status: Home / Self Care | Source: Ambulatory Visit | Attending: Cardiovascular Disease | Admitting: Cardiovascular Disease

## 2024-05-21 DIAGNOSIS — I493 Ventricular premature depolarization: Secondary | ICD-10-CM | POA: Diagnosis not present

## 2024-05-21 DIAGNOSIS — I255 Ischemic cardiomyopathy: Secondary | ICD-10-CM

## 2024-05-21 LAB — ECHOCARDIOGRAM COMPLETE
Area-P 1/2: 2.51 cm2
P 1/2 time: 827 ms
S' Lateral: 4.8 cm

## 2024-06-24 NOTE — Progress Notes (Signed)
 Remote ICD transmission.

## 2024-06-24 NOTE — Addendum Note (Signed)
 Addended by: TAWNI DRILLING D on: 06/24/2024 02:11 PM   Modules accepted: Orders

## 2024-07-02 ENCOUNTER — Telehealth: Payer: Self-pay | Admitting: Cardiology

## 2024-07-02 NOTE — Telephone Encounter (Signed)
 Patient reports feeling irregular heart beats and seeing irregular beats with pauses on his watch over the past 3 days. He feels he is having more PVC's which improved after he started the Mexiletine and Entresto  since March.   Denies any SOB, signs of fluid retention, chest pains or dizziness.   Reviewed patient's transmission:  Normal Device Function. No episodes recorded by device.  PVC burden is 1.4%. (since March) - March burden was 18% VP percent has gone up from 56% to 70% with addition of medications.  Most recent ECHO was on 05/21/24: LV mild dilation with EF 35-45%.  Corvue showing trend downward - risk for fluid retention - patient denies symptoms.   Will review with Dr. Kennyth.  Patient would like follow up with new EP provider to review current symptoms and programming.

## 2024-07-02 NOTE — Telephone Encounter (Signed)
 Patient c/o Palpitations:  STAT if patient reporting lightheadedness, shortness of breath, or chest pain  How long have you had palpitations/irregular HR/ Afib? Are you having the symptoms now? Irregular HR for a couple of days  Are you currently experiencing lightheadedness, SOB or CP? Denies any symptoms  Do you have a history of afib (atrial fibrillation) or irregular heart rhythm?   Have you checked your BP or HR? (document readings if available): BP normal but does not have HR readings to give me  Are you experiencing any other symptoms? No    Patient started on Entresto  24-26mg  and Mexiletine 200mg  at last OV with Dr. [name]. Wife states that everything has been good since but the last couple of days he can feel his HR being irregular. Denies all symptoms, states they seem to get better with exercise.

## 2024-07-02 NOTE — Telephone Encounter (Signed)
 Spoke with pt and his wife and requested they send a device transmission for review.  Will forward to device clinic for further review.  Pt verbalizes understanding and states he will send.

## 2024-07-05 NOTE — Telephone Encounter (Signed)
 Can you please advise?

## 2024-07-05 NOTE — Telephone Encounter (Signed)
 Spoke to Dr. Kennyth and advised he would like to see patient in office to discuss a CRT upgrade.   Patient is agreeable to plan and voiced understanding.

## 2024-07-05 NOTE — Telephone Encounter (Signed)
 Patient is following up requesting to speak with RN again. He says he was told RN would speak with Dr. Kennyth and give him a call back. Please advise.

## 2024-07-08 NOTE — Telephone Encounter (Signed)
 Spoke w/ patient - he is scheduled to see Dr. Kennyth on 8/11.

## 2024-07-09 ENCOUNTER — Encounter: Payer: Self-pay | Admitting: Pharmacist Clinician (PhC)/ Clinical Pharmacy Specialist

## 2024-07-21 NOTE — H&P (View-Only) (Signed)
 Electrophysiology Office Note:   Date:  07/22/2024  ID:  Joel Lin, DOB 1941/08/01, MRN [id number]  Primary Cardiologist: Candyce Reek, MD Electrophysiologist: Fonda Kitty, MD      History of Present Illness:   Joel Lin is a 83 y.o. male with h/o aborted sudden cardiac death in the setting of ischemic heart disease with prior PCI and mild depression of LV systolic function, s/p ICD, VT with appropriate device shock who is being seen today for follow up for his defibrillator.  Discussed the use of AI scribe software for clinical note transcription with the patient, who gave verbal consent to proceed.  History of Present Illness Joel Lin is an 83 year old male with a history of heart attack and pacemaker use who presents for evaluation of his pacemaker and potential device upgrade. He is accompanied by his wife, [name]. He was previously under the care of Dr. [name], who has since retired.  He has a history of heart attack in 1981.  He has an ICD.  His heart pumping function was normal at 60% in August 2024, but recent echocardiograms show a decrease to 35-40%.  This coincides with him using his pacemaker more frequently, with 73% ventricular pacing noted today. He experiences skipped beats, particularly when at rest, but feels fine when walking, although he may feel slightly winded. He is currently on medications including mexiletine, Entresto , and sotalol  to manage his heart condition. No worsening shortness of breath, swelling in the legs, or orthopnea.  He has a history of allergies and is considering changing his allergy medication from Allegra to Xyzal. He also inquired about obtaining a prescription for nitroglycerin , which he has never used but wants to have available.   Review of systems complete and found to be negative unless listed in HPI.   EP Information / Studies Reviewed:    EKG is ordered today. Personal review as below.  EKG  Interpretation Date/Time:  Monday July 22 2024 10:01:33 EDT Ventricular Rate:  57 PR Interval:  312 QRS Duration:  172 QT Interval:  470 QTC Calculation: 457 R Axis:   142  Text Interpretation: Sinus bradycardia with marked sinus arrhythmia with 1st degree A-V block with frequent AV dual-paced complexes Right bundle branch block Lateral infarct , age undetermined Inferior infarct , age undetermined When compared with ECG of 05-Mar-2024 10:44, PR interval has decreased More intrinsic conducted beats today Confirmed by Kitty Fonda [phone]) on 07/22/2024 10:10:11 AM   Echo 05/2024:   1. Left ventricular ejection fraction, by estimation, is 35 to 40%. The  left ventricle has moderately decreased function. The left ventricle  demonstrates global hypokinesis. The left ventricular internal cavity size  was mildly dilated. Left ventricular  diastolic parameters are consistent with Grade I diastolic dysfunction  (impaired relaxation).   2. Right ventricular systolic function is normal. The right ventricular  size is normal. There is normal pulmonary artery systolic pressure. The  estimated right ventricular systolic pressure is 20.1 mmHg.   3. Left atrial size was mildly dilated.   4. The mitral valve is grossly normal. Mild to moderate mitral valve  regurgitation. No evidence of mitral stenosis.   5. The aortic valve is tricuspid. Aortic valve regurgitation is mild to  moderate. Aortic valve sclerosis is present, with no evidence of aortic  valve stenosis.   6. The inferior vena cava is normal in size with greater than 50%  respiratory variability, suggesting right atrial pressure of 3 mmHg.  Physical Exam:   VS:  BP (!) 140/84 (BP Location: Left Arm, Patient Position: Sitting, Cuff Size: Normal)   Pulse (!) 58   Ht 5' 8 (1.727 m)   Wt 164 lb (74.4 kg)   SpO2 98%   BMI 24.94 kg/m    Wt Readings from Last 3 Encounters:  07/22/24 164 lb (74.4 kg)  03/05/24 164 lb 9.6 oz (74.7  kg)  12/20/23 164 lb 6.4 oz (74.6 kg)     GEN: Well nourished, well developed in no acute distress NECK: No JVD CARDIAC: Normal rate, irregular rhythm.  Well-healed left chest ICD pocket. RESPIRATORY:  Clear to auscultation without rales, wheezing or rhonchi  ABDOMEN: Soft, non-distended EXTREMITIES:  No edema; No deformity   ASSESSMENT AND PLAN:    #Cardiac arrest s/p ICD:  - In-clinic device interrogation was performed today.  Appropriate device function and stable lead parameters. - RV pacing percentage has increased slowly over time.  His base rate has been lowered and his AV delay has been extended in efforts to minimize ventricular pacing, in addition to having VIP turned on.  Despite this, he is now pacing 73% of the time.  I suspect his increased pacing burden is contributing to his decreased LVEF.  I also feel like his current programming is increasing his PVC burden.  Given development of clinical heart failure with drop in LVEF secondary to increased RV pacing, he meets criteria for upgrade to CRT.  Risk and benefits of upgrade grade to CRT-D were discussed with the patient.  Patient voiced understanding and would like to proceed.  Will perform venogram prior to incision to assess for left upper extremity venous system patency.  #VT #PVCs #High risk medication use: Sotalol  and mexiletine.  QTc 457 ms.  Last creatinine normal at 0.9.  We will obtain new labs in anticipation of procedure. - Continue sotalol  and mexiletine for now.  May be able to suppress ectopy with increasing lower rate limit and shortening his AV delays after BiV upgrade.  #Chronic systolic heart failure: Decline in LVEF coincides with increase in ventricular pacing.  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  He was started on medications for PVC suppression and pacing percentage has increased. #Ischemic cardiomyopathy: CAD s/p PCI.  Denies chest pain. -  Recommend upgrade to CRT-D. - Continue GDMT regimen of Entresto  and spironolactone . - Formally followed by Dr. Dann.  He is now establishing care with Dr. Barbaraann.   Follow up with Dr. Kennyth 3 months after BiV upgrade.    Total time of encounter: 69 minutes total time of encounter, including face-to-face patient care, chart review, coordination of care and counseling regarding high complexity medical decision making.  Signed, Fonda Kennyth, MD

## 2024-07-21 NOTE — Progress Notes (Unsigned)
 Electrophysiology Office Note:   Date:  07/22/2024  ID:  Joel Lin, DOB 1941-03-29, MRN [id number]  Primary Cardiologist: Candyce Reek, MD Electrophysiologist: Fonda Kitty, MD      History of Present Illness:   Joel Lin is a 83 y.o. male with h/o aborted sudden cardiac death in the setting of ischemic heart disease with prior PCI and mild depression of LV systolic function, s/p ICD, VT with appropriate device shock who is being seen today for follow up for his defibrillator.  Discussed the use of AI scribe software for clinical note transcription with the patient, who gave verbal consent to proceed.  History of Present Illness Joel Lin is an 83 year old male with a history of heart attack and pacemaker use who presents for evaluation of his pacemaker and potential device upgrade. He is accompanied by his wife, [name]. He was previously under the care of Dr. [name], who has since retired.  He has a history of heart attack in 1981.  He has an ICD.  His heart pumping function was normal at 60% in August 2024, but recent echocardiograms show a decrease to 35-40%.  This coincides with him using his pacemaker more frequently, with 73% ventricular pacing noted today. He experiences skipped beats, particularly when at rest, but feels fine when walking, although he may feel slightly winded. He is currently on medications including mexiletine, Entresto , and sotalol  to manage his heart condition. No worsening shortness of breath, swelling in the legs, or orthopnea.  He has a history of allergies and is considering changing his allergy medication from Allegra to Xyzal. He also inquired about obtaining a prescription for nitroglycerin , which he has never used but wants to have available.   Review of systems complete and found to be negative unless listed in HPI.   EP Information / Studies Reviewed:    EKG is ordered today. Personal review as below.  EKG  Interpretation Date/Time:  Monday July 22 2024 10:01:33 EDT Ventricular Rate:  57 PR Interval:  312 QRS Duration:  172 QT Interval:  470 QTC Calculation: 457 R Axis:   142  Text Interpretation: Sinus bradycardia with marked sinus arrhythmia with 1st degree A-V block with frequent AV dual-paced complexes Right bundle branch block Lateral infarct , age undetermined Inferior infarct , age undetermined When compared with ECG of 05-Mar-2024 10:44, PR interval has decreased More intrinsic conducted beats today Confirmed by Kitty Fonda [phone]) on 07/22/2024 10:10:11 AM   Echo 05/2024:   1. Left ventricular ejection fraction, by estimation, is 35 to 40%. The  left ventricle has moderately decreased function. The left ventricle  demonstrates global hypokinesis. The left ventricular internal cavity size  was mildly dilated. Left ventricular  diastolic parameters are consistent with Grade I diastolic dysfunction  (impaired relaxation).   2. Right ventricular systolic function is normal. The right ventricular  size is normal. There is normal pulmonary artery systolic pressure. The  estimated right ventricular systolic pressure is 20.1 mmHg.   3. Left atrial size was mildly dilated.   4. The mitral valve is grossly normal. Mild to moderate mitral valve  regurgitation. No evidence of mitral stenosis.   5. The aortic valve is tricuspid. Aortic valve regurgitation is mild to  moderate. Aortic valve sclerosis is present, with no evidence of aortic  valve stenosis.   6. The inferior vena cava is normal in size with greater than 50%  respiratory variability, suggesting right atrial pressure of 3 mmHg.  Physical Exam:   VS:  BP (!) 140/84 (BP Location: Left Arm, Patient Position: Sitting, Cuff Size: Normal)   Pulse (!) 58   Ht 5' 8 (1.727 m)   Wt 164 lb (74.4 kg)   SpO2 98%   BMI 24.94 kg/m    Wt Readings from Last 3 Encounters:  07/22/24 164 lb (74.4 kg)  03/05/24 164 lb 9.6 oz (74.7  kg)  12/20/23 164 lb 6.4 oz (74.6 kg)     GEN: Well nourished, well developed in no acute distress NECK: No JVD CARDIAC: Normal rate, irregular rhythm.  Well-healed left chest ICD pocket. RESPIRATORY:  Clear to auscultation without rales, wheezing or rhonchi  ABDOMEN: Soft, non-distended EXTREMITIES:  No edema; No deformity   ASSESSMENT AND PLAN:    #Cardiac arrest s/p ICD:  - In-clinic device interrogation was performed today.  Appropriate device function and stable lead parameters. - RV pacing percentage has increased slowly over time.  His base rate has been lowered and his AV delay has been extended in efforts to minimize ventricular pacing, in addition to having VIP turned on.  Despite this, he is now pacing 73% of the time.  I suspect his increased pacing burden is contributing to his decreased LVEF.  I also feel like his current programming is increasing his PVC burden.  Given development of clinical heart failure with drop in LVEF secondary to increased RV pacing, he meets criteria for upgrade to CRT.  Risk and benefits of upgrade grade to CRT-D were discussed with the patient.  Patient voiced understanding and would like to proceed.  Will perform venogram prior to incision to assess for left upper extremity venous system patency.  #VT #PVCs #High risk medication use: Sotalol  and mexiletine.  QTc 457 ms.  Last creatinine normal at 0.9.  We will obtain new labs in anticipation of procedure. - Continue sotalol  and mexiletine for now.  May be able to suppress ectopy with increasing lower rate limit and shortening his AV delays after BiV upgrade.  #Chronic systolic heart failure: Decline in LVEF coincides with increase in ventricular pacing.  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  He was started on medications for PVC suppression and pacing percentage has increased. #Ischemic cardiomyopathy: CAD s/p PCI.  Denies chest pain. -  Recommend upgrade to CRT-D. - Continue GDMT regimen of Entresto  and spironolactone . - Formally followed by Dr. Dann.  He is now establishing care with Dr. Barbaraann.   Follow up with Dr. Kennyth 3 months after BiV upgrade.    Total time of encounter: 69 minutes total time of encounter, including face-to-face patient care, chart review, coordination of care and counseling regarding high complexity medical decision making.  Signed, Fonda Kennyth, MD

## 2024-07-22 ENCOUNTER — Other Ambulatory Visit: Payer: Self-pay | Admitting: Internal Medicine

## 2024-07-22 ENCOUNTER — Other Ambulatory Visit: Payer: Self-pay

## 2024-07-22 ENCOUNTER — Ambulatory Visit: Attending: Cardiology | Admitting: Cardiology

## 2024-07-22 ENCOUNTER — Encounter: Payer: Self-pay | Admitting: Cardiology

## 2024-07-22 VITALS — BP 140/84 | HR 58 | Ht 68.0 in | Wt 164.0 lb

## 2024-07-22 DIAGNOSIS — I5022 Chronic systolic (congestive) heart failure: Secondary | ICD-10-CM | POA: Diagnosis not present

## 2024-07-22 DIAGNOSIS — I472 Ventricular tachycardia, unspecified: Secondary | ICD-10-CM

## 2024-07-22 DIAGNOSIS — I255 Ischemic cardiomyopathy: Secondary | ICD-10-CM

## 2024-07-22 DIAGNOSIS — I493 Ventricular premature depolarization: Secondary | ICD-10-CM

## 2024-07-22 DIAGNOSIS — Z95810 Presence of automatic (implantable) cardiac defibrillator: Secondary | ICD-10-CM | POA: Diagnosis not present

## 2024-07-22 DIAGNOSIS — Z79899 Other long term (current) drug therapy: Secondary | ICD-10-CM

## 2024-07-22 LAB — CUP PACEART INCLINIC DEVICE CHECK
Battery Remaining Longevity: 24 mo
Brady Statistic RA Percent Paced: 22 %
Brady Statistic RV Percent Paced: 73 %
Date Time Interrogation Session: 20250811130806
HighPow Impedance: 49 Ohm
HighPow Impedance: 49.1831
Implantable Lead Connection Status: 753985
Implantable Lead Connection Status: 753985
Implantable Lead Implant Date: 20090427
Implantable Lead Implant Date: 20180305
Implantable Lead Location: 753859
Implantable Lead Location: 753860
Implantable Lead Model: 7121
Implantable Pulse Generator Implant Date: 20180305
Lead Channel Impedance Value: 325 Ohm
Lead Channel Impedance Value: 437.5 Ohm
Lead Channel Pacing Threshold Amplitude: 0.5 V
Lead Channel Pacing Threshold Amplitude: 0.5 V
Lead Channel Pacing Threshold Amplitude: 1.5 V
Lead Channel Pacing Threshold Amplitude: 1.5 V
Lead Channel Pacing Threshold Pulse Width: 0.5 ms
Lead Channel Pacing Threshold Pulse Width: 0.5 ms
Lead Channel Pacing Threshold Pulse Width: 0.5 ms
Lead Channel Pacing Threshold Pulse Width: 0.5 ms
Lead Channel Sensing Intrinsic Amplitude: 10.8 mV
Lead Channel Sensing Intrinsic Amplitude: 4.5 mV
Lead Channel Setting Pacing Amplitude: 1.5 V
Lead Channel Setting Pacing Amplitude: 1.75 V
Lead Channel Setting Pacing Pulse Width: 0.5 ms
Lead Channel Setting Sensing Sensitivity: 0.5 mV
Pulse Gen Serial Number: 1247854

## 2024-07-22 MED ORDER — DIPHENHYDRAMINE HCL 50 MG PO TABS: 50.0000 mg | ORAL_TABLET | Freq: Once | ORAL | 0 refills | Status: DC

## 2024-07-22 MED ORDER — NITROGLYCERIN 0.4 MG SL SUBL: 0.4000 mg | SUBLINGUAL_TABLET | SUBLINGUAL | 6 refills | Status: DC | PRN

## 2024-07-22 MED ORDER — PREDNISONE 50 MG PO TABS: ORAL_TABLET | ORAL | 0 refills | Status: AC

## 2024-07-22 NOTE — Patient Instructions (Addendum)
 Medication Instructions:  Your physician recommends that you continue on your current medications as directed. Please refer to the Current Medication list given to you today.  *If you need a refill on your cardiac medications before your next appointment, please call your pharmacy*  Lab Work: TODAY: BMET and CBC  - please stop at [facility] on the first floor  Testing/Procedures: ICD Upgrade - please see instruction letter give to you today.  Follow-Up: At [facility], you and your health needs are our priority.  As part of our continuing mission to provide you with exceptional heart care, our providers are all part of one team.  This team includes your primary Cardiologist (physician) and Advanced Practice Providers or APPs (Physician Assistants and Nurse Practitioners) who all work together to provide you with the care you need, when you need it.  Your next appointment:   We will contact you to schedule your follow up appointments.

## 2024-07-23 LAB — BASIC METABOLIC PANEL WITH GFR
BUN/Creatinine Ratio: 27 — ABNORMAL HIGH (ref 10–24)
BUN: 26 mg/dL (ref 8–27)
CO2: 20 mmol/L (ref 20–29)
Calcium: 9.6 mg/dL (ref 8.6–10.2)
Chloride: 103 mmol/L (ref 96–106)
Creatinine, Ser: 0.98 mg/dL (ref 0.76–1.27)
Glucose: 112 mg/dL — ABNORMAL HIGH (ref 70–99)
Potassium: 4.5 mmol/L (ref 3.5–5.2)
Sodium: 140 mmol/L (ref 134–144)
eGFR: 77 mL/min/1.73 (ref >59)

## 2024-07-23 LAB — CBC
Hematocrit: 48.8 % (ref 37.5–51.0)
Hemoglobin: 15.8 g/dL (ref 13.0–17.7)
MCH: 29.3 pg (ref 26.6–33.0)
MCHC: 32.4 g/dL (ref 31.5–35.7)
MCV: 91 fL (ref 79–97)
Platelets: 152 x10E3/uL (ref 150–450)
RBC: 5.39 x10E6/uL (ref 4.14–5.80)
RDW: 12.7 % (ref 11.6–15.4)
WBC: 6 x10E3/uL (ref 3.4–10.8)

## 2024-07-24 ENCOUNTER — Ambulatory Visit: Payer: Self-pay | Admitting: Cardiology

## 2024-07-24 ENCOUNTER — Telehealth (HOSPITAL_COMMUNITY): Payer: Self-pay

## 2024-07-24 NOTE — Telephone Encounter (Signed)
 Attempted to reach patient to discuss upcoming procedure, no answer.

## 2024-08-01 ENCOUNTER — Other Ambulatory Visit: Payer: Self-pay | Admitting: Internal Medicine

## 2024-08-01 ENCOUNTER — Ambulatory Visit (HOSPITAL_COMMUNITY)
Admission: RE | Admit: 2024-08-01 | Discharge: 2024-08-02 | Disposition: A | Discharge status: Home / Self Care | Attending: Cardiology | Admitting: Cardiology

## 2024-08-01 ENCOUNTER — Ambulatory Visit (HOSPITAL_COMMUNITY)
Admission: RE | Disposition: A | Discharge status: Home / Self Care | Payer: Self-pay | Source: Home / Self Care | Attending: Cardiology

## 2024-08-01 ENCOUNTER — Other Ambulatory Visit: Payer: Self-pay

## 2024-08-01 DIAGNOSIS — I255 Ischemic cardiomyopathy: Secondary | ICD-10-CM | POA: Diagnosis not present

## 2024-08-01 DIAGNOSIS — Z4502 Encounter for adjustment and management of automatic implantable cardiac defibrillator: Secondary | ICD-10-CM

## 2024-08-01 DIAGNOSIS — Z79899 Other long term (current) drug therapy: Secondary | ICD-10-CM | POA: Insufficient documentation

## 2024-08-01 DIAGNOSIS — I469 Cardiac arrest, cause unspecified: Secondary | ICD-10-CM

## 2024-08-01 DIAGNOSIS — I252 Old myocardial infarction: Secondary | ICD-10-CM | POA: Diagnosis not present

## 2024-08-01 DIAGNOSIS — I5022 Chronic systolic (congestive) heart failure: Secondary | ICD-10-CM | POA: Diagnosis not present

## 2024-08-01 DIAGNOSIS — Z8674 Personal history of sudden cardiac arrest: Secondary | ICD-10-CM | POA: Diagnosis not present

## 2024-08-01 DIAGNOSIS — Z95810 Presence of automatic (implantable) cardiac defibrillator: Secondary | ICD-10-CM

## 2024-08-01 HISTORY — PX: BIV UPGRADE: EP1202

## 2024-08-01 HISTORY — PX: LEAD INSERTION: EP1212

## 2024-08-01 LAB — MRSA NEXT GEN BY PCR, NASAL: MRSA by PCR Next Gen: NOT DETECTED

## 2024-08-01 SURGERY — BIV UPGRADE
Anesthesia: LOCAL

## 2024-08-01 MED ORDER — ROSUVASTATIN CALCIUM 5 MG PO TABS
5.0000 mg | ORAL_TABLET | ORAL | Status: DC
  Administered 2024-08-02: 5 mg via ORAL
  Filled 2024-08-01: qty 1

## 2024-08-01 MED ORDER — IODIXANOL 320 MG/ML IV SOLN
INTRAVENOUS | Status: DC | PRN
  Administered 2024-08-01: 13 mL via INTRAVENOUS
  Administered 2024-08-01 (×2): 15 mL via INTRAVENOUS

## 2024-08-01 MED ORDER — VANCOMYCIN HCL IN DEXTROSE 1-5 GM/200ML-% IV SOLN
INTRAVENOUS | Status: AC
  Filled 2024-08-01: qty 200

## 2024-08-01 MED ORDER — HEPARIN (PORCINE) IN NACL 1000-0.9 UT/500ML-% IV SOLN
INTRAVENOUS | Status: DC | PRN
  Administered 2024-08-01: 500 mL

## 2024-08-01 MED ORDER — CHLORHEXIDINE GLUCONATE 4 % EX SOLN: 4.0000 | Freq: Once | CUTANEOUS | Status: DC

## 2024-08-01 MED ORDER — OMEGA-3-ACID ETHYL ESTERS 1 G PO CAPS
1.0000 g | ORAL_CAPSULE | Freq: Every day | ORAL | Status: DC
  Administered 2024-08-01 – 2024-08-02 (×2): 1 g via ORAL
  Filled 2024-08-01 (×2): qty 1

## 2024-08-01 MED ORDER — FENTANYL CITRATE (PF) 100 MCG/2ML IJ SOLN
INTRAMUSCULAR | Status: AC
  Filled 2024-08-01: qty 2

## 2024-08-01 MED ORDER — DOCUSATE SODIUM 100 MG PO CAPS
100.0000 mg | ORAL_CAPSULE | Freq: Every evening | ORAL | Status: DC
  Administered 2024-08-02: 100 mg via ORAL
  Filled 2024-08-01: qty 1

## 2024-08-01 MED ORDER — LIDOCAINE HCL (PF) 1 % IJ SOLN
INTRAMUSCULAR | Status: DC | PRN
  Administered 2024-08-01: 60 mL

## 2024-08-01 MED ORDER — SACUBITRIL-VALSARTAN 24-26 MG PO TABS
1.0000 | ORAL_TABLET | Freq: Two times a day (BID) | ORAL | Status: DC
  Administered 2024-08-01 – 2024-08-02 (×2): 1 via ORAL
  Filled 2024-08-01 (×2): qty 1

## 2024-08-01 MED ORDER — POLYETHYLENE GLYCOL 3350 17 G PO PACK: 17.0000 g | PACK | Freq: Every day | ORAL | Status: DC | PRN

## 2024-08-01 MED ORDER — FENTANYL CITRATE (PF) 100 MCG/2ML IJ SOLN
INTRAMUSCULAR | Status: DC | PRN
  Administered 2024-08-01 (×2): 12.5 ug via INTRAVENOUS
  Administered 2024-08-01: 50 ug via INTRAVENOUS
  Administered 2024-08-01: 25 ug via INTRAVENOUS

## 2024-08-01 MED ORDER — ADULT MULTIVITAMIN W/MINERALS CH
1.0000 | ORAL_TABLET | Freq: Every day | ORAL | Status: DC
  Administered 2024-08-01 – 2024-08-02 (×2): 1 via ORAL
  Filled 2024-08-01 (×2): qty 1

## 2024-08-01 MED ORDER — ASPIRIN 81 MG PO CHEW: 81.0000 mg | CHEWABLE_TABLET | Freq: Every evening | ORAL | Status: DC

## 2024-08-01 MED ORDER — VANCOMYCIN HCL 1000 MG IV SOLR
INTRAVENOUS | Status: DC | PRN
  Administered 2024-08-01: 1000 mg via INTRAVENOUS

## 2024-08-01 MED ORDER — METHYLPREDNISOLONE SODIUM SUCC 125 MG IJ SOLR
125.0000 mg | Freq: Once | INTRAMUSCULAR | Status: DC
  Filled 2024-08-01: qty 2

## 2024-08-01 MED ORDER — MELATONIN 5 MG PO TABS
5.0000 mg | ORAL_TABLET | Freq: Every evening | ORAL | Status: DC | PRN
  Administered 2024-08-01 (×2): 5 mg via ORAL
  Filled 2024-08-01: qty 1

## 2024-08-01 MED ORDER — DIPHENHYDRAMINE HCL 50 MG/ML IJ SOLN
INTRAMUSCULAR | Status: DC | PRN
  Administered 2024-08-01: 25 mg via INTRAVENOUS

## 2024-08-01 MED ORDER — VANCOMYCIN HCL IN DEXTROSE 1-5 GM/200ML-% IV SOLN: 1000.0000 mg | INTRAVENOUS | Status: DC

## 2024-08-01 MED ORDER — VITAMIN D 25 MCG (1000 UNIT) PO TABS
1000.0000 [IU] | ORAL_TABLET | Freq: Every day | ORAL | Status: DC
  Administered 2024-08-01 – 2024-08-02 (×2): 1000 [IU] via ORAL
  Filled 2024-08-01 (×2): qty 1

## 2024-08-01 MED ORDER — SPIRONOLACTONE 12.5 MG HALF TABLET
12.5000 mg | ORAL_TABLET | Freq: Every day | ORAL | Status: DC
  Administered 2024-08-02: 12.5 mg via ORAL
  Filled 2024-08-01: qty 1

## 2024-08-01 MED ORDER — MIDAZOLAM HCL 2 MG/2ML IJ SOLN
INTRAMUSCULAR | Status: AC
  Filled 2024-08-01: qty 2

## 2024-08-01 MED ORDER — MEXILETINE HCL 200 MG PO CAPS
200.0000 mg | ORAL_CAPSULE | Freq: Two times a day (BID) | ORAL | Status: DC
  Administered 2024-08-01 – 2024-08-02 (×2): 200 mg via ORAL
  Filled 2024-08-01 (×2): qty 1

## 2024-08-01 MED ORDER — MIDAZOLAM HCL 5 MG/5ML IJ SOLN
INTRAMUSCULAR | Status: DC | PRN
  Administered 2024-08-01 (×2): .5 mg via INTRAVENOUS
  Administered 2024-08-01: 1 mg via INTRAVENOUS

## 2024-08-01 MED ORDER — SODIUM CHLORIDE 0.9 % IV SOLN
80.0000 mg | INTRAVENOUS | Status: AC
  Administered 2024-08-01: 80 mg

## 2024-08-01 MED ORDER — ONDANSETRON HCL 4 MG/2ML IJ SOLN: 4.0000 mg | Freq: Four times a day (QID) | INTRAMUSCULAR | Status: DC | PRN

## 2024-08-01 MED ORDER — MUPIROCIN 2 % EX OINT
1.0000 | TOPICAL_OINTMENT | Freq: Once | CUTANEOUS | Status: AC
  Administered 2024-08-01: 1 via TOPICAL
  Filled 2024-08-01: qty 22

## 2024-08-01 MED ORDER — DIPHENHYDRAMINE HCL 50 MG/ML IJ SOLN
25.0000 mg | Freq: Once | INTRAMUSCULAR | Status: DC
  Filled 2024-08-01: qty 1

## 2024-08-01 MED ORDER — SOTALOL HCL 80 MG PO TABS
80.0000 mg | ORAL_TABLET | Freq: Two times a day (BID) | ORAL | Status: DC
  Administered 2024-08-01 – 2024-08-02 (×2): 80 mg via ORAL
  Filled 2024-08-01 (×2): qty 1

## 2024-08-01 MED ORDER — ACETAMINOPHEN 325 MG PO TABS: 325.0000 mg | ORAL_TABLET | ORAL | Status: DC | PRN

## 2024-08-01 MED ORDER — SODIUM CHLORIDE 0.9 % IV SOLN: INTRAVENOUS | Status: DC

## 2024-08-01 MED ORDER — SODIUM CHLORIDE 0.9 % IV SOLN
INTRAVENOUS | Status: AC
  Filled 2024-08-01: qty 2

## 2024-08-01 MED ORDER — POVIDONE-IODINE 10 % EX SWAB: 2.0000 | Freq: Once | CUTANEOUS | Status: DC

## 2024-08-01 MED ORDER — NITROGLYCERIN 0.4 MG SL SUBL: 0.4000 mg | SUBLINGUAL_TABLET | SUBLINGUAL | Status: DC | PRN

## 2024-08-01 SURGICAL SUPPLY — 18 items
BALLOON COR SINUS VENO 6FR 80 (BALLOONS) IMPLANT
CABLE SURGICAL S-101-97-12 (CABLE) ×1 IMPLANT
CATH CPS DIRECT 135 DS2C020 (CATHETERS) IMPLANT
CATH CPS LOCATOR 3D LG (CATHETERS) IMPLANT
CATH CPS LOCATOR 3D MED (CATHETERS) IMPLANT
ICD GALLANT HF CRT-D DF-1 IS-1 (ICD Generator) IMPLANT
KIT MICROPUNCTURE NIT STIFF (SHEATH) IMPLANT
LEAD QUARTET 1456Q-86 (Lead) IMPLANT
LEAD ULTIPACE 65 LPA1231/65 (Lead) IMPLANT
PAD DEFIB RADIO PHYSIO CONN (PAD) ×1 IMPLANT
POUCH AIGIS-R ANTIBACT ICD LRG (Mesh General) IMPLANT
SHEATH 9.5FR PRELUDE SNAP 13 (SHEATH) IMPLANT
SLITTER AGILIS HISPRO (INSTRUMENTS) IMPLANT
TOOL HELIX LOCKING (MISCELLANEOUS) IMPLANT
TRAY PACEMAKER INSERTION (PACKS) ×1 IMPLANT
WIRE ACUITY WHISPER EDS 4648 (WIRE) IMPLANT
WIRE HI TORQ VERSACORE-J 145CM (WIRE) IMPLANT
WIRE MICRO SET SILHO 5FR 7 (SHEATH) IMPLANT

## 2024-08-01 NOTE — Plan of Care (Signed)
  Problem: Education: Goal: Knowledge of cardiac device and self-care will improve Outcome: Progressing   Problem: Cardiac: Goal: Ability to achieve and maintain adequate cardiopulmonary perfusion will improve Outcome: Progressing   

## 2024-08-01 NOTE — Interval H&P Note (Signed)
 History and Physical Interval Note:  08/01/2024 1:48 PM  MARKE GOODWYN  has presented today for surgery, with the diagnosis of systolic heart failure, pacing induced cardiomyopathy.  The various methods of treatment have been discussed with the patient and family. After consideration of risks, benefits and other options for treatment, the patient has consented to  Procedure(s): BIV UPGRADE (N/A) LEAD INSERTION (N/A) as a surgical intervention.  The patient's history has been reviewed, patient examined, no change in status, stable for surgery.  I have reviewed the patient's chart and labs.  Questions were answered to the patient's satisfaction.     Fonda Kitty

## 2024-08-02 ENCOUNTER — Encounter (HOSPITAL_COMMUNITY): Payer: Self-pay | Admitting: Cardiology

## 2024-08-02 ENCOUNTER — Other Ambulatory Visit: Payer: Self-pay

## 2024-08-02 ENCOUNTER — Ambulatory Visit (HOSPITAL_COMMUNITY)

## 2024-08-02 DIAGNOSIS — Z8674 Personal history of sudden cardiac arrest: Secondary | ICD-10-CM | POA: Diagnosis not present

## 2024-08-02 DIAGNOSIS — I5022 Chronic systolic (congestive) heart failure: Secondary | ICD-10-CM | POA: Diagnosis not present

## 2024-08-02 DIAGNOSIS — Z79899 Other long term (current) drug therapy: Secondary | ICD-10-CM | POA: Diagnosis not present

## 2024-08-02 DIAGNOSIS — I255 Ischemic cardiomyopathy: Secondary | ICD-10-CM | POA: Diagnosis not present

## 2024-08-02 DIAGNOSIS — R9389 Abnormal findings on diagnostic imaging of other specified body structures: Secondary | ICD-10-CM | POA: Diagnosis not present

## 2024-08-02 DIAGNOSIS — Z4502 Encounter for adjustment and management of automatic implantable cardiac defibrillator: Secondary | ICD-10-CM | POA: Diagnosis not present

## 2024-08-02 DIAGNOSIS — I252 Old myocardial infarction: Secondary | ICD-10-CM | POA: Diagnosis not present

## 2024-08-02 DIAGNOSIS — I700 Atherosclerosis of aorta: Secondary | ICD-10-CM | POA: Diagnosis not present

## 2024-08-02 LAB — MAGNESIUM: Magnesium: 2.3 mg/dL (ref 1.7–2.4)

## 2024-08-02 NOTE — Progress Notes (Signed)
 Pt's sling reapplied to remind pt to not move his left arm after pacer leads were added. Pt has no sensitivity in the arm, so readily moves it w/o sling.

## 2024-08-02 NOTE — TOC Transition Note (Signed)
 Transition of Care [facility]) - Discharge Note   Patient Details  Name: Joel Lin MRN: [id number] Date of Birth: May 11, 1941  [facility] CM/SW Contact:  Waddell Barnie Rama, RN Phone Number: 08/02/2024, 10:54 AM   Clinical Narrative:    For dc , has no needs.         Patient Goals and CMS Choice            Discharge Placement                       Discharge Plan and Services Additional resources added to the After Visit Summary for                                       Social Drivers of Health (SDOH) Interventions SDOH Screenings   Food Insecurity: No Food Insecurity (08/01/2024)  Housing: Low Risk  (08/01/2024)  Transportation Needs: No Transportation Needs (08/01/2024)  Utilities: Not At Risk (08/01/2024)  Social Connections: Socially Integrated (08/01/2024)  Tobacco Use: Medium Risk (07/22/2024)     Readmission Risk Interventions     No data to display

## 2024-08-02 NOTE — Discharge Instructions (Addendum)
 After Your ICD (Implantable Cardiac Defibrillator)   You have a [employer] ICD  ACTIVITY Do not lift your arm above shoulder height for 1 week after your procedure. After 7 days, you may progress as below.  You should remove your sling 24 hours after your procedure, unless otherwise instructed by your provider.     Friday August 09, 2024  Saturday August 10, 2024 Sunday August 11, 2024 Monday August 12, 2024   Do not lift, push, pull, or carry anything over 10 pounds with the affected arm until 6 weeks (Friday September 13, 2024 ) after your procedure.   You may drive AFTER 7 days unless you have been told otherwise by your provider.   Ask your healthcare provider when you can go back to work   INCISION/Dressing If you are on a blood thinner such as Coumadin, Xarelto, Eliquis, Plavix, or Pradaxa please confirm with your provider when this should be resumed.  If large square, outer bandage is left in place, this can be removed after 24 hours from your procedure. Do not remove steri-strips or glue as below.   Monitor your defibrillator site for redness, swelling, and drainage. Call the device clinic at [phone] if you experience these symptoms or fever/chills.  If your incision is sealed with Steri-strips or staples, you may shower 7 days after your procedure or when told by your provider. Do not remove the steri-strips or let the shower hit directly on your site. You may wash around your site with soap and water.    If you were discharged in a sling, please do not wear this during the day more than 48 hours after your surgery unless otherwise instructed. This may increase the risk of stiffness and soreness in your shoulder.   Avoid lotions, ointments, or perfumes over your incision until it is well-healed.  You may use a hot tub or a pool AFTER your wound check appointment if the incision is completely closed.  Your ICD is designed to protect you from life threatening heart rhythms.  Because of this, you may receive a shock.   1 shock with no symptoms:  Call the office during business hours. 1 shock with symptoms (chest pain, chest pressure, dizziness, lightheadedness, shortness of breath, overall feeling unwell):  Call 911. If you experience 2 or more shocks in 24 hours:  Call 911. If you receive a shock, you should not drive for 6 months per the [place] DMV IF you receive appropriate therapy from your ICD.   ICD Alerts:  Some alerts are vibratory and others beep. These are NOT emergencies. Please call our office to let us  know. If this occurs at night or on weekends, it can wait until the next business day. Send a remote transmission.  If your device is capable of reading fluid status (for heart failure), you will be offered monthly monitoring to review this with you.   DEVICE MANAGEMENT Remote monitoring is used to monitor your ICD from home. This monitoring is scheduled every 91 days by our office. It allows us  to keep an eye on the functioning of your device to ensure it is working properly. You will routinely see your Electrophysiologist annually (more often if necessary).   You should receive your ID card for your new device in 4-8 weeks. Keep this card with you at all times once received. Consider wearing a medical alert bracelet or necklace.  Your ICD  may be MRI compatible. This will be discussed at your next office visit/wound  check.  You should avoid contact with strong electric or magnetic fields.   Do not use amateur (ham) radio equipment or electric (arc) welding torches. MP3 player headphones with magnets should not be used. Some devices are safe to use if held at least 12 inches (30 cm) from your defibrillator. These include power tools, lawn mowers, and speakers. If you are unsure if something is safe to use, ask your health care provider.  When using your cell phone, hold it to the ear that is on the opposite side from the defibrillator. Do not leave your cell  phone in a pocket over the defibrillator.  You may safely use electric blankets, heating pads, computers, and microwave ovens.  Call the office right away if: You have chest pain. You feel more than one shock. You feel more short of breath than you have felt before. You feel more light-headed than you have felt before. Your incision starts to open up.  This information is not intended to replace advice given to you by your health care provider. Make sure you discuss any questions you have with your health care provider.

## 2024-08-02 NOTE — Discharge Summary (Addendum)
 ELECTROPHYSIOLOGY PROCEDURE DISCHARGE SUMMARY    Patient ID: Joel Lin,  MRN: [id number], DOB/AGE: 1941/12/11 83 y.o.  Admit date: 08/01/2024 Discharge date: 08/02/2024  Primary Care Physician: Rexanne Ingle, MD  Primary Cardiologist: Dr. Alveta (inactive) Electrophysiologist: Dr. [name] > Dr. Kennyth  Primary Discharge Diagnosis:  ICM bradycardia  Secondary Discharge Diagnosis:  Hx of cardiac arrest CAD ICM VT, PVCs  Allergies  Allergen Reactions   Epinephrine  Other (See Comments)    Elevated heartbeat   Losartan  Palpitations and Other (See Comments)    Irregular heartbeat   Metoprolol Other (See Comments)    Irregular heartbeat   Mydriacyl [Tropicamide] Other (See Comments)    Elevated heartbeat   Phenylephrine  Hcl (Pressors) Other (See Comments)    Increased heartrate   Bisoprolol  Other (See Comments)    Feels it does not agree with him   Cefpodoxime     Other Reaction(s): Skin buring   Iodinated Contrast Media Hives   Clopidogrel Bisulfate Hives        Iodine  Hives    Renografin   Statins Other (See Comments)    Muscle cramps (crestor  low dose is ok)     Procedures This Admission:  1.  upgrade of his dual chamber ICD to CRT device with addition of LB area pacing lead/ICD on 08/01/24 by Dr Nancey.   DFT's were deferred at time of implant (No suitable CS veins) There were no immediate post procedure complications. 2.  CXR on 08/02/24 demonstrated no pneumothorax status post device implantation.   Brief HPI: Joel Lin is a 83 y.o. male is followed in the office, with recent reduction in LVEF > and bradycardia > recommend upgrade of his ICD > CRT device. Rational, risks, benefits, and alternatives were reviewed with the patient who wished to proceed.   Hospital Course:  The patient was admitted and underwent upgrade of his ICD to CRT-D with details as outlined in the procedure report. He was monitored on telemetry overnight which  demonstrated AV paced.  Left chest was without hematoma or ecchymosis.  The device was interrogated and found to be functioning normally.  CXR was obtained and demonstrated no pneumothorax status post device implantation.  Wound care, arm mobility, and restrictions were reviewed with the patient.  The patient feels well, denies any CP or SOB, minimal site discomfort, he was examined by Dr. Kennyth and considered stable for discharge to home.   The patient's discharge medications include an ACE/ARB (Entresto ) and beta blocker (sotalol ).   QTc is acceptable given paced QRS and ICD labs/lytes/creat stable  Plan will be outpatient hopefully to be able to peel away at his AADs given pacing, follow PVC burden outpatient  Physical Exam: Vitals:   08/02/24 0503 08/02/24 0600 08/02/24 0639 08/02/24 0820  BP: 136/78   123/75  Pulse:  97 74 73  Resp:  18 (!) 22 14  Temp: 97.6 F (36.4 C)   97.6 F (36.4 C)  TempSrc: Oral   Oral  SpO2:  90% 98% 96%  Weight: 74.8 kg     Height:        GEN- The patient is well appearing, alert and oriented x 3 today.   HEENT: normocephalic, atraumatic; sclera clear, conjunctiva pink; hearing intact; oropharynx clear Lungs-  CTA b/l, normal work of breathing.  No wheezes, rales, rhonchi Heart- RRR, no murmurs, rubs or gallops, PMI not laterally displaced GI- soft, non-tender, non-distended Extremities- no clubbing, cyanosis, or edema MS- no significant  deformity or atrophy Skin- warm and dry, no rash or lesion, left chest without hematoma/ecchymosis Psych- euthymic mood, full affect Neuro- no gross defecits  Labs:   Lab Results  Component Value Date   WBC 6.0 07/22/2024   HGB 15.8 07/22/2024   HCT 48.8 07/22/2024   MCV 91 07/22/2024   PLT 152 07/22/2024   No results for input(s): NA, K, CL, CO2, BUN, CREATININE, CALCIUM , PROT, BILITOT, ALKPHOS, ALT, AST, GLUCOSE in the last 168 hours.  Invalid input(s): LABALBU  Discharge  Medications:  Allergies as of 08/02/2024       Reactions   Epinephrine  Other (See Comments)   Elevated heartbeat   Losartan  Palpitations, Other (See Comments)   Irregular heartbeat   Metoprolol Other (See Comments)   Irregular heartbeat   Mydriacyl [tropicamide] Other (See Comments)   Elevated heartbeat   Phenylephrine  Hcl (pressors) Other (See Comments)   Increased heartrate   Bisoprolol  Other (See Comments)   Feels it does not agree with him   Cefpodoxime    Other Reaction(s): Skin buring   Iodinated Contrast Media Hives   Clopidogrel Bisulfate Hives      Iodine  Hives   Renografin   Statins Other (See Comments)   Muscle cramps (crestor  low dose is ok)        Medication List     TAKE these medications    aspirin  81 MG tablet Take 81 mg by mouth every evening.   cholecalciferol  1000 units tablet Commonly known as: VITAMIN D  Take 1,000 Units by mouth daily.   diphenhydrAMINE  50 MG capsule Commonly known as: BENADRYL  Take 50 mg by mouth every 6 (six) hours as needed for itching. Notes to patient: You have 2 listed diphenhydramine  listed, use care, do not exceed allowed total daily dosing    Banophen  25 mg capsule Generic drug: diphenhydrAMINE  Take 25 mg by mouth every 6 (six) hours as needed for allergies.   docusate sodium  100 MG capsule Commonly known as: COLACE Take 100 mg by mouth every evening.   Entresto  24-26 MG Generic drug: sacubitril -valsartan  Take 1 tablet by mouth 2 (two) times daily.   fish oil-omega-3 fatty acids  1000 MG capsule Take 1 g by mouth daily.   fluticasone  50 MCG/ACT nasal spray Commonly known as: FLONASE  Place 1 spray into both nostrils daily as needed for allergies or rhinitis.   GLUCOSAMINE-MSM DS PO Take 1 tablet by mouth daily.   ibuprofen  200 MG tablet Commonly known as: ADVIL  Take 400 mg by mouth every 6 (six) hours as needed for moderate pain.   levocetirizine 5 MG tablet Commonly known as: XYZAL Take 5 mg by  mouth every evening.   Melatonin 5 MG Caps Take 5 mg by mouth at bedtime as needed (sleep).   mexiletine 200 MG capsule Commonly known as: MEXITIL  Take 1 capsule (200 mg total) by mouth 2 (two) times daily.   multivitamin with minerals Tabs tablet Take 1 tablet by mouth daily.   nitroGLYCERIN  0.2 mg/hr patch Commonly known as: NITRODUR - Dosed in mg/24 hr APPLY 1 PATCH ONTO THE SKINDAILY   nitroGLYCERIN  0.4 MG SL tablet Commonly known as: NITROSTAT  Place 1 tablet (0.4 mg total) under the tongue every 5 (five) minutes as needed for chest pain ((MAX 3 DOSES)).   polyethylene glycol 17 g packet Commonly known as: MIRALAX  / GLYCOLAX  Take 17 g by mouth daily as needed for moderate constipation.   predniSONE  50 MG tablet Commonly known as: DELTASONE  Take 1 tablet 13 hours prior  to procedure, take 1 tablet 7 hours prior to procedure, and take 1 tablet 1 hour prior to procedure.   Repatha  SureClick 140 MG/ML Soaj Generic drug: Evolocumab  INJECT 140 MG INTO THE SKIN EVERY 14 (FOURTEEN) DAYS.   rosuvastatin  5 MG tablet Commonly known as: CRESTOR  TAKE 1 TABLET EVERY OTHER  DAY   sotalol  80 MG tablet Commonly known as: BETAPACE  TAKE 1 TABLET TWICE A DAY   spironolactone  25 MG tablet Commonly known as: ALDACTONE  TAKE 1/2 TABLET DAILY        Disposition: home Discharge Instructions     Diet - low sodium heart healthy   Complete by: As directed    Increase activity slowly   Complete by: As directed        Signed, Charlies Arthur, PA-C 08/02/2024 10:09 AM   I have seen, examined the patient, and reviewed the above assessment and plan.    Hospital Course:  Patient presented yesterday for scheduled upgrade to CRT-D. No suitable CS branches. LBBAP lead implanted with evidence of left sided conduction system capture. Monitored overnight with no obvious complications. No complaints today. Discharged with scheduled follow up.  General: Well developed, in no acute distress.   Neck: No JVD.  Cardiac: Normal rate, regular rhythm. Left chest pacer pocket without bleeding or hematoma.  Resp: Normal work of breathing.  Ext: No edema.  Neuro: No gross focal deficits.  Psych: Normal affect.   Assessment and Plan #. 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  - Device check performed with appropriate device function and stable lead parameters.  - Usual post implant instructions provided regarding activity restrictions and wound care.  - Follow up in device clinic in ~2 weeks.  Duration of discharge encounter: 35 minutes.  Fonda Kitty, MD 08/02/2024 3:28 PM

## 2024-08-02 NOTE — Plan of Care (Signed)
  Problem: Education: Goal: Knowledge of cardiac device and self-care will improve Outcome: Progressing   Problem: Education: Goal: Knowledge of General Education information will improve Description: Including pain rating scale, medication(s)/side effects and non-pharmacologic comfort measures Outcome: Progressing   Problem: Clinical Measurements: Goal: Will remain free from infection Outcome: Progressing Goal: Respiratory complications will improve Outcome: Progressing   Problem: Activity: Goal: Risk for activity intolerance will decrease Outcome: Progressing   Problem: Elimination: Goal: Will not experience complications related to urinary retention Outcome: Progressing   Problem: Pain Managment: Goal: General experience of comfort will improve and/or be controlled Outcome: Progressing

## 2024-08-02 NOTE — TOC CM/SW Note (Signed)
 Transition of Care [facility]) - Inpatient Brief Assessment   Patient Details  Name: Joel Lin MRN: [id number] Date of Birth: 04-29-1941  Transition of Care [facility]) CM/SW Contact:    Waddell Barnie Rama, RN Phone Number: 08/02/2024, 10:53 AM   Clinical Narrative: From home with spouse, has PCP and insurance on file, states has no HH services in place at this time or DME at home.  States family member will transport them home at [employer] and family is support system, states gets medications [facility] [address].  Pta self ambulatory.   There are no ICM  needs identified  at this time.  Please place consult for ICM needs.     Transition of Care Asessment: Insurance and Status: Insurance coverage has been reviewed Patient has primary care physician: Yes Home environment has been reviewed: home with wife Prior level of function:: indep Prior/Current Home Services: No current home services Social Drivers of Health Review: SDOH reviewed no interventions necessary Readmission risk has been reviewed: Yes Transition of care needs: no transition of care needs at this time

## 2024-08-05 ENCOUNTER — Other Ambulatory Visit (HOSPITAL_COMMUNITY): Payer: Self-pay

## 2024-08-05 ENCOUNTER — Telehealth: Payer: Self-pay | Admitting: Cardiology

## 2024-08-05 ENCOUNTER — Other Ambulatory Visit: Payer: Self-pay

## 2024-08-05 MED ORDER — NITROGLYCERIN 0.2 MG/HR TD PT24
0.2000 mg | MEDICATED_PATCH | Freq: Every day | TRANSDERMAL | 3 refills | Status: DC
  Filled 2024-08-05: qty 90, 90d supply, fill #0

## 2024-08-05 MED ORDER — NITROGLYCERIN 0.4 MG SL SUBL
0.4000 mg | SUBLINGUAL_TABLET | SUBLINGUAL | 6 refills | Status: AC | PRN
  Filled 2024-08-05: qty 25, 10d supply, fill #0

## 2024-08-05 MED FILL — Midazolam HCl Inj 2 MG/2ML (Base Equivalent): INTRAMUSCULAR | Qty: 2 | Status: AC

## 2024-08-05 NOTE — Telephone Encounter (Signed)
 Pt c/o medication issue:  1. Name of Medication:   nitroGLYCERIN  (NITRODUR - DOSED IN MG/24 HR) 0.2 mg/hr patch    2. How are you currently taking this medication (dosage and times per day)? As written   3. Are you having a reaction (difficulty breathing--STAT)? No   4. What is your medication issue? Myra with pharmacy called in stating this medication is on backorder. That asked if its okay to fill generic - Transderm patch    Reference #: [id number]

## 2024-08-06 ENCOUNTER — Other Ambulatory Visit: Payer: Self-pay

## 2024-08-06 ENCOUNTER — Ambulatory Visit: Payer: Medicare HMO

## 2024-08-07 ENCOUNTER — Telehealth: Payer: Self-pay | Admitting: Cardiology

## 2024-08-07 MED ORDER — NITROGLYCERIN 0.2 MG/HR TD PT24: 0.2000 mg | MEDICATED_PATCH | Freq: Every day | TRANSDERMAL | 3 refills | Status: AC

## 2024-08-07 NOTE — Telephone Encounter (Signed)
 Follow-up after same day discharge:  Implant date: 08/01/24 MD: Fonda Kitty Device: Abbott CRT-D Location: Left Chest   Wound check visit: 08/15/24 90 day MD follow-up: 11/01/24  Remote Transmission received:08/04/24  Dressing/sling removed: Yes  Confirm OAC restart on: N/A (Aspirin  only)   Please continue to monitor your cardiac device site for redness, swelling, and drainage. Call the device clinic at [phone] if you experience these symptoms, fever/chills, or have questions about your device.   Remote monitoring is used to monitor your cardiac device from home. This monitoring is scheduled every 91 days by our office. It allows us  to keep an eye on the functioning of your device to ensure it is working properly.   Spoke with the patient and he voices understanding of the above and is agreeable.

## 2024-08-07 NOTE — Telephone Encounter (Signed)
 Pt's medication was resent to pt's mail order pharmacy [facility] as requested. Confirmation received. Per Maccia, Melissa D, RPH-CPP to Richie Adrien ORN, RN  [facility] Triage     08/06/24  8:40 PM Ok to fill generic

## 2024-08-15 ENCOUNTER — Ambulatory Visit: Attending: Cardiology

## 2024-08-15 DIAGNOSIS — I472 Ventricular tachycardia, unspecified: Secondary | ICD-10-CM | POA: Diagnosis not present

## 2024-08-15 LAB — CUP PACEART INCLINIC DEVICE CHECK
Battery Remaining Longevity: 96 mo
Brady Statistic RA Percent Paced: 99 %
Brady Statistic RV Percent Paced: 99.6 %
Date Time Interrogation Session: 20250904143345
HighPow Impedance: 44.0578
Implantable Lead Connection Status: 753985
Implantable Lead Connection Status: 753985
Implantable Lead Connection Status: 753985
Implantable Lead Implant Date: 20090427
Implantable Lead Implant Date: 20180305
Implantable Lead Implant Date: 20250821
Implantable Lead Location: 753859
Implantable Lead Location: 753860
Implantable Lead Location: 753860
Implantable Lead Model: 7121
Implantable Pulse Generator Implant Date: 20250821
Lead Channel Impedance Value: 275 Ohm
Lead Channel Impedance Value: 300 Ohm
Lead Channel Impedance Value: 425 Ohm
Lead Channel Pacing Threshold Amplitude: 0.5 V
Lead Channel Pacing Threshold Amplitude: 0.5 V
Lead Channel Pacing Threshold Amplitude: 1.5 V
Lead Channel Pacing Threshold Amplitude: 1.5 V
Lead Channel Pacing Threshold Pulse Width: 0.5 ms
Lead Channel Pacing Threshold Pulse Width: 0.5 ms
Lead Channel Pacing Threshold Pulse Width: 0.5 ms
Lead Channel Pacing Threshold Pulse Width: 0.5 ms
Lead Channel Sensing Intrinsic Amplitude: 3 mV
Lead Channel Sensing Intrinsic Amplitude: 4.5 mV
Lead Channel Setting Pacing Amplitude: 1 V
Lead Channel Setting Pacing Amplitude: 1.5 V
Lead Channel Setting Pacing Pulse Width: 0.5 ms
Lead Channel Setting Sensing Sensitivity: 0.3 mV
Pulse Gen Serial Number: 211053250

## 2024-08-15 NOTE — Progress Notes (Signed)
 Normal multi chamber ICD wound check. Wound well healed. Presenting rhythm: AP/VP 72. Routine testing performed. Thresholds, sensing, and impedance consistent with implant measurements. No treated arrhythmias. 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 Reviewed arm restrictions to continue for 6 weeks total post op. Reviewed shock plan.  Pt enrolled in remote follow-up.  Programming Changes: - Atrial sensitivity programmed to 0.36mV from 0.74mV in order to cover atrial FFOS.

## 2024-08-15 NOTE — Patient Instructions (Signed)

## 2024-08-18 ENCOUNTER — Ambulatory Visit: Payer: Self-pay | Admitting: Cardiology

## 2024-08-21 ENCOUNTER — Other Ambulatory Visit: Payer: Self-pay | Admitting: Internal Medicine

## 2024-09-04 ENCOUNTER — Encounter: Admitting: Internal Medicine

## 2024-09-06 ENCOUNTER — Ambulatory Visit (INDEPENDENT_AMBULATORY_CARE_PROVIDER_SITE_OTHER)

## 2024-09-06 DIAGNOSIS — I472 Ventricular tachycardia, unspecified: Secondary | ICD-10-CM

## 2024-09-12 LAB — CUP PACEART REMOTE DEVICE CHECK
Battery Remaining Longevity: 96 mo
Battery Remaining Percentage: 95.5 %
Battery Voltage: 3.11 V
Brady Statistic AP VP Percent: 99 %
Brady Statistic AP VS Percent: 1 %
Brady Statistic AS VP Percent: 1 %
Brady Statistic AS VS Percent: 1 %
Brady Statistic RA Percent Paced: 99 %
Date Time Interrogation Session: 20251001124914
HighPow Impedance: 46 Ohm
Implantable Lead Connection Status: 753985
Implantable Lead Connection Status: 753985
Implantable Lead Connection Status: 753985
Implantable Lead Implant Date: 20090427
Implantable Lead Implant Date: 20180305
Implantable Lead Implant Date: 20250821
Implantable Lead Location: 753859
Implantable Lead Location: 753860
Implantable Lead Location: 753860
Implantable Lead Model: 7121
Implantable Pulse Generator Implant Date: 20250821
Lead Channel Impedance Value: 260 Ohm
Lead Channel Impedance Value: 350 Ohm
Lead Channel Impedance Value: 430 Ohm
Lead Channel Pacing Threshold Amplitude: 0.5 V
Lead Channel Pacing Threshold Amplitude: 0.5 V
Lead Channel Pacing Threshold Pulse Width: 0.5 ms
Lead Channel Pacing Threshold Pulse Width: 0.5 ms
Lead Channel Sensing Intrinsic Amplitude: 2.3 mV
Lead Channel Sensing Intrinsic Amplitude: 3.4 mV
Lead Channel Setting Pacing Amplitude: 1 V
Lead Channel Setting Pacing Amplitude: 1.5 V
Lead Channel Setting Pacing Pulse Width: 0.5 ms
Lead Channel Setting Sensing Sensitivity: 0.3 mV
Pulse Gen Serial Number: 211053250

## 2024-09-15 ENCOUNTER — Ambulatory Visit: Payer: Self-pay | Admitting: Cardiology

## 2024-09-16 NOTE — Progress Notes (Signed)
 Remote ICD Transmission

## 2024-09-17 ENCOUNTER — Other Ambulatory Visit: Payer: Self-pay | Admitting: Internal Medicine

## 2024-09-17 DIAGNOSIS — I252 Old myocardial infarction: Secondary | ICD-10-CM

## 2024-09-17 DIAGNOSIS — I2510 Atherosclerotic heart disease of native coronary artery without angina pectoris: Secondary | ICD-10-CM

## 2024-09-17 DIAGNOSIS — E782 Mixed hyperlipidemia: Secondary | ICD-10-CM

## 2024-09-25 DIAGNOSIS — I10 Essential (primary) hypertension: Secondary | ICD-10-CM | POA: Diagnosis not present

## 2024-09-25 DIAGNOSIS — R972 Elevated prostate specific antigen [PSA]: Secondary | ICD-10-CM | POA: Diagnosis not present

## 2024-09-25 DIAGNOSIS — E7800 Pure hypercholesterolemia, unspecified: Secondary | ICD-10-CM | POA: Diagnosis not present

## 2024-09-25 DIAGNOSIS — D696 Thrombocytopenia, unspecified: Secondary | ICD-10-CM | POA: Diagnosis not present

## 2024-10-02 DIAGNOSIS — N401 Enlarged prostate with lower urinary tract symptoms: Secondary | ICD-10-CM | POA: Diagnosis not present

## 2024-10-02 DIAGNOSIS — R972 Elevated prostate specific antigen [PSA]: Secondary | ICD-10-CM | POA: Diagnosis not present

## 2024-10-02 DIAGNOSIS — R3912 Poor urinary stream: Secondary | ICD-10-CM | POA: Diagnosis not present

## 2024-10-16 ENCOUNTER — Ambulatory Visit: Payer: Self-pay | Admitting: Student

## 2024-10-29 ENCOUNTER — Encounter: Payer: Self-pay | Admitting: Pulmonary Disease

## 2024-10-29 ENCOUNTER — Ambulatory Visit: Attending: Pulmonary Disease | Admitting: Pulmonary Disease

## 2024-10-29 VITALS — BP 129/79 | HR 73 | Ht 68.0 in | Wt 166.0 lb

## 2024-10-29 DIAGNOSIS — I472 Ventricular tachycardia, unspecified: Secondary | ICD-10-CM | POA: Diagnosis not present

## 2024-10-29 DIAGNOSIS — Z95810 Presence of automatic (implantable) cardiac defibrillator: Secondary | ICD-10-CM

## 2024-10-29 DIAGNOSIS — I5022 Chronic systolic (congestive) heart failure: Secondary | ICD-10-CM | POA: Diagnosis not present

## 2024-10-29 LAB — CUP PACEART INCLINIC DEVICE CHECK
Battery Remaining Longevity: 92 mo
Brady Statistic RA Percent Paced: 98 %
Brady Statistic RV Percent Paced: 99.22 %
Date Time Interrogation Session: 20251118164312
HighPow Impedance: 45.4526
Implantable Lead Connection Status: 753985
Implantable Lead Connection Status: 753985
Implantable Lead Connection Status: 753985
Implantable Lead Implant Date: 20090427
Implantable Lead Implant Date: 20180305
Implantable Lead Implant Date: 20250821
Implantable Lead Location: 753859
Implantable Lead Location: 753860
Implantable Lead Location: 753860
Implantable Lead Model: 7121
Implantable Pulse Generator Implant Date: 20250821
Lead Channel Impedance Value: 250 Ohm
Lead Channel Impedance Value: 325 Ohm
Lead Channel Impedance Value: 437.5 Ohm
Lead Channel Pacing Threshold Amplitude: 0.5 V
Lead Channel Pacing Threshold Amplitude: 0.5 V
Lead Channel Pacing Threshold Pulse Width: 0.5 ms
Lead Channel Pacing Threshold Pulse Width: 0.5 ms
Lead Channel Sensing Intrinsic Amplitude: 1.6 mV
Lead Channel Sensing Intrinsic Amplitude: 2.6 mV
Lead Channel Setting Pacing Amplitude: 1 V
Lead Channel Setting Pacing Amplitude: 1.5 V
Lead Channel Setting Pacing Pulse Width: 0.5 ms
Lead Channel Setting Sensing Sensitivity: 0.3 mV
Pulse Gen Serial Number: 211053250

## 2024-10-29 NOTE — Progress Notes (Signed)
  Electrophysiology Office Note:   Date:  10/29/2024  ID:  Joel Lin, DOB [birth year], MRN [id number]  Primary Cardiologist: Candyce Reek, MD Primary Heart Failure: None Electrophysiologist: Fonda Kitty, MD       History of Present Illness:   Joel Lin is a 83 y.o. male with h/o CAD s/p cardiac arrest with PCI, ICD  seen today for routine electrophysiology follow-up s/p upgrade to Biventricular ICD (LBA lead)  Since last being seen in our clinic the patient reports doing well overall. He asks if his device site will always be that large. No specific device concerns otherwise.     He denies chest pain, palpitations, dyspnea, PND, orthopnea, nausea, vomiting, dizziness, syncope, edema, weight gain, or early satiety.    Review of systems complete and found to be negative unless listed in HPI.    EP Information / Studies Reviewed:    EKG is ordered today. Personal review as below.  EKG Interpretation Date/Time:  Tuesday October 29 2024 14:58:34 EST Ventricular Rate:  73 PR Interval:  188 QRS Duration:  150 QT Interval:  434 QTC Calculation: 478 R Axis:   -14  Text Interpretation: AV dual-paced rhythm Confirmed by Aniceto Jarvis ([id number]) on 10/29/2024 3:05:04 PM   ICD Interrogation-  reviewed in detail today,  See PACEART report.  Device History: Abbott ICD implanted 04/07/08 for cardiac arrest, upgraded to dual chamber 02/13/18 due to symptomatic sinus bradycardia and then upgrade to BiV ICD 08/01/24 (LBA lead with conduction system capture) History of appropriate therapy: Yes > VT s/p antitachycardia pacing 12/2011, s/p ICD shock 02/26/13 & 06/26/15, phantom shock 05/13/22  History of AAD therapy: Yes; currently on Sotalol , Mexiletine    Risk Assessment/Calculations:              Physical Exam:   VS:  BP 129/79   Pulse 73   Ht 5' 8 (1.727 m)   Wt 166 lb (75.3 kg)   SpO2 96%   BMI 25.24 kg/m    Wt Readings from Last 3 Encounters:  10/29/24 166 lb  (75.3 kg)  08/02/24 164 lb 14.5 oz (74.8 kg)  07/22/24 164 lb (74.4 kg)     GEN: Well nourished, well developed in no acute distress NECK: No JVD; No carotid bruits CARDIAC: Regular rate and rhythm, no murmurs, rubs, gallops. Device site wnl, no swelling, well healed, no tethering  RESPIRATORY:  Clear to auscultation without rales, wheezing or rhonchi  ABDOMEN: Soft, non-tender, non-distended EXTREMITIES:  No edema; No deformity   ASSESSMENT AND PLAN:    Chronic Systolic Dysfunction s/p Abbott CRT-D  Hx Cardiac Arrest  Symptomatic Sinus Bradycardia  High Risk Medication Monitoring: Sotalol   LV only pacing  -euvolemic on exam / by device   -Stable on an appropriate medical regimen -EKG as above, stable QTc -continue Sotalol , Mexiletine > reviewed could consider stopping mexiletine and monitoring but we elected to keep as he felt well  -note LRL 70 bpm > sensed events in V estimated 1-3%  -Normal ICD function -See Pace Art report -AT/AF episodes are FFOS/FFRV oversensing by EGM, known issue for patient / not true AF -No programming changes today -update ECHO and follow up with Dr. Kitty  -recall in for Dr. Barbaraann in January    Disposition:   Follow up with Dr. Kitty in 2-3 months post ECHO      Signed, Jarvis Aniceto, NP-C, AGACNP-BC [facility] - Electrophysiology  10/29/2024, 3:07 PM

## 2024-10-29 NOTE — Patient Instructions (Signed)
 Medication Instructions:  Your physician recommends that you continue on your current medications as directed. Please refer to the Current Medication list given to you today.  *If you need a refill on your cardiac medications before your next appointment, please call your pharmacy*  Lab Work: None ordered If you have labs (blood work) drawn today and your tests are completely normal, you will receive your results only by: MyChart Message (if you have MyChart) OR A paper copy in the mail If you have any lab test that is abnormal or we need to change your treatment, we will call you to review the results.  Testing/Procedures: Your physician has requested that you have an echocardiogram. Echocardiography is a painless test that uses sound waves to create images of your heart. It provides your doctor with information about the size and shape of your heart and how well your heart's chambers and valves are working. This procedure takes approximately one hour. There are no restrictions for this procedure. Please do NOT wear cologne, perfume, aftershave, or lotions (deodorant is allowed). Please arrive 15 minutes prior to your appointment time.  Please note: We ask at that you not bring children with you during ultrasound (echo/ vascular) testing. Due to room size and safety concerns, children are not allowed in the ultrasound rooms during exams. Our front office staff cannot provide observation of children in our lobby area while testing is being conducted. An adult accompanying a patient to their appointment will only be allowed in the ultrasound room at the discretion of the ultrasound technician under special circumstances. We apologize for any inconvenience.   Follow-Up: At [facility], you and your health needs are our priority.  As part of our continuing mission to provide you with exceptional heart care, our providers are all part of one team.  This team includes your primary  Cardiologist (physician) and Advanced Practice Providers or APPs (Physician Assistants and Nurse Practitioners) who all work together to provide you with the care you need, when you need it.  Your next appointment:   2 month(s) after echo  Provider:   Fonda Kitty, MD

## 2024-10-30 ENCOUNTER — Ambulatory Visit: Payer: Self-pay | Admitting: Cardiology

## 2024-11-01 ENCOUNTER — Ambulatory Visit: Admitting: Pulmonary Disease

## 2024-11-01 DIAGNOSIS — R0981 Nasal congestion: Secondary | ICD-10-CM | POA: Diagnosis not present

## 2024-11-01 DIAGNOSIS — G4700 Insomnia, unspecified: Secondary | ICD-10-CM | POA: Diagnosis not present

## 2024-11-05 ENCOUNTER — Ambulatory Visit: Payer: Medicare HMO

## 2024-11-12 ENCOUNTER — Telehealth: Payer: Self-pay | Admitting: Pulmonary Disease

## 2024-11-12 NOTE — Telephone Encounter (Signed)
 Most recent device check > noted to have false episodes of AF due to [facility] on the atrial lead.    Reviewed in clinic with Industry on 11/12/24 > plan to change PVAB to 130 ms (farfield spike on atrial channel measures approximately 120 ms).     Have reached out to EP Scheduler to have patient come back in for adjustment.     Daphne Barrack, NP-C, AGACNP-BC [facility] - Electrophysiology  11/12/2024, 4:20 PM

## 2024-11-22 ENCOUNTER — Ambulatory Visit: Admitting: Physician Assistant

## 2024-12-06 ENCOUNTER — Ambulatory Visit

## 2024-12-06 DIAGNOSIS — I472 Ventricular tachycardia, unspecified: Secondary | ICD-10-CM | POA: Diagnosis not present

## 2024-12-07 LAB — CUP PACEART REMOTE DEVICE CHECK
Battery Remaining Longevity: 94 mo
Battery Remaining Percentage: 94 %
Battery Voltage: 3.04 V
Brady Statistic AP VP Percent: 94 %
Brady Statistic AP VS Percent: 3.9 %
Brady Statistic AS VP Percent: 1.6 %
Brady Statistic AS VS Percent: 1 %
Brady Statistic RA Percent Paced: 98 %
Date Time Interrogation Session: 20251226020045
HighPow Impedance: 45 Ohm
Implantable Lead Connection Status: 753985
Implantable Lead Connection Status: 753985
Implantable Lead Connection Status: 753985
Implantable Lead Implant Date: 20090427
Implantable Lead Implant Date: 20180305
Implantable Lead Implant Date: 20250821
Implantable Lead Location: 753859
Implantable Lead Location: 753860
Implantable Lead Location: 753860
Implantable Lead Model: 7121
Implantable Pulse Generator Implant Date: 20250821
Lead Channel Impedance Value: 240 Ohm
Lead Channel Impedance Value: 330 Ohm
Lead Channel Impedance Value: 430 Ohm
Lead Channel Pacing Threshold Amplitude: 0.5 V
Lead Channel Pacing Threshold Amplitude: 0.5 V
Lead Channel Pacing Threshold Pulse Width: 0.5 ms
Lead Channel Pacing Threshold Pulse Width: 0.5 ms
Lead Channel Sensing Intrinsic Amplitude: 2.8 mV
Lead Channel Sensing Intrinsic Amplitude: 5.2 mV
Lead Channel Setting Pacing Amplitude: 1 V
Lead Channel Setting Pacing Amplitude: 1.5 V
Lead Channel Setting Pacing Pulse Width: 0.5 ms
Lead Channel Setting Sensing Sensitivity: 0.3 mV
Pulse Gen Serial Number: 211053250

## 2024-12-09 ENCOUNTER — Ambulatory Visit (HOSPITAL_COMMUNITY)
Admission: RE | Admit: 2024-12-09 | Discharge: 2024-12-09 | Disposition: A | Discharge status: Home / Self Care | Source: Ambulatory Visit | Attending: Internal Medicine | Admitting: Internal Medicine

## 2024-12-09 DIAGNOSIS — I5022 Chronic systolic (congestive) heart failure: Secondary | ICD-10-CM | POA: Diagnosis not present

## 2024-12-09 LAB — ECHOCARDIOGRAM COMPLETE
Area-P 1/2: 3.78 cm2
Est EF: 40
S' Lateral: 4 cm

## 2024-12-09 NOTE — Progress Notes (Signed)
 Remote ICD Transmission

## 2024-12-15 ENCOUNTER — Ambulatory Visit: Payer: Self-pay | Admitting: Cardiology

## 2024-12-19 ENCOUNTER — Encounter: Payer: Self-pay | Admitting: Cardiovascular Disease

## 2025-01-10 ENCOUNTER — Telehealth: Payer: Self-pay | Admitting: Pharmacy Technician

## 2025-01-10 ENCOUNTER — Encounter (HOSPITAL_BASED_OUTPATIENT_CLINIC_OR_DEPARTMENT_OTHER): Payer: Self-pay | Admitting: Pharmacist Clinician (PhC)/ Clinical Pharmacy Specialist

## 2025-01-10 ENCOUNTER — Other Ambulatory Visit (HOSPITAL_COMMUNITY): Payer: Self-pay

## 2025-01-10 NOTE — Telephone Encounter (Signed)
 Ran test claim for sacubitril /valsartan . For a 30 day supply and the co-pay is 28.08 . PA is not needed at this time. This test claim was processed through [facility]- copay amounts may vary at other pharmacies due to pharmacy/plan contracts, or as the patient moves through the different stages of their insurance plan.

## 2025-01-13 ENCOUNTER — Telehealth: Payer: Self-pay | Admitting: Pharmacy Technician

## 2025-01-30 ENCOUNTER — Ambulatory Visit: Admitting: Cardiovascular Disease

## 2025-02-04 ENCOUNTER — Ambulatory Visit: Payer: Medicare HMO

## 2025-02-12 ENCOUNTER — Ambulatory Visit: Admitting: Cardiology

## 2025-03-07 ENCOUNTER — Ambulatory Visit

## 2025-05-06 ENCOUNTER — Ambulatory Visit

## 2025-06-06 ENCOUNTER — Ambulatory Visit

## 2025-08-05 ENCOUNTER — Ambulatory Visit

## 2025-09-05 ENCOUNTER — Ambulatory Visit

## 2025-11-04 ENCOUNTER — Ambulatory Visit

## 2025-12-08 ENCOUNTER — Ambulatory Visit

## 2026-02-03 ENCOUNTER — Ambulatory Visit

## 2026-03-06 ENCOUNTER — Ambulatory Visit

## 2026-05-05 ENCOUNTER — Ambulatory Visit

## 2026-06-05 ENCOUNTER — Ambulatory Visit
